# Patient Record
Sex: Male | Born: 1965
Health system: Southern US, Community
[De-identification: ages and names within clinical notes are randomized; demographics above are authoritative.]

## PROBLEM LIST (undated history)

## (undated) DIAGNOSIS — Z8489 Family history of other specified conditions: Secondary | ICD-10-CM

## (undated) DIAGNOSIS — F419 Anxiety disorder, unspecified: Secondary | ICD-10-CM

## (undated) DIAGNOSIS — G629 Polyneuropathy, unspecified: Secondary | ICD-10-CM

## (undated) DIAGNOSIS — Z79899 Other long term (current) drug therapy: Secondary | ICD-10-CM

## (undated) DIAGNOSIS — R002 Palpitations: Secondary | ICD-10-CM

## (undated) DIAGNOSIS — J449 Chronic obstructive pulmonary disease, unspecified: Secondary | ICD-10-CM

## (undated) DIAGNOSIS — D649 Anemia, unspecified: Secondary | ICD-10-CM

## (undated) DIAGNOSIS — Z87442 Personal history of urinary calculi: Secondary | ICD-10-CM

## (undated) DIAGNOSIS — M797 Fibromyalgia: Secondary | ICD-10-CM

## (undated) DIAGNOSIS — R911 Solitary pulmonary nodule: Secondary | ICD-10-CM

## (undated) DIAGNOSIS — E781 Pure hyperglyceridemia: Secondary | ICD-10-CM

## (undated) DIAGNOSIS — R7303 Prediabetes: Secondary | ICD-10-CM

## (undated) DIAGNOSIS — I2510 Atherosclerotic heart disease of native coronary artery without angina pectoris: Secondary | ICD-10-CM

## (undated) DIAGNOSIS — F1190 Opioid use, unspecified, uncomplicated: Secondary | ICD-10-CM

## (undated) DIAGNOSIS — K219 Gastro-esophageal reflux disease without esophagitis: Secondary | ICD-10-CM

## (undated) DIAGNOSIS — I10 Essential (primary) hypertension: Secondary | ICD-10-CM

## (undated) DIAGNOSIS — M1990 Unspecified osteoarthritis, unspecified site: Secondary | ICD-10-CM

## (undated) DIAGNOSIS — G8929 Other chronic pain: Secondary | ICD-10-CM

## (undated) DIAGNOSIS — R079 Chest pain, unspecified: Secondary | ICD-10-CM

## (undated) DIAGNOSIS — E538 Deficiency of other specified B group vitamins: Secondary | ICD-10-CM

## (undated) DIAGNOSIS — R109 Unspecified abdominal pain: Secondary | ICD-10-CM

## (undated) DIAGNOSIS — I639 Cerebral infarction, unspecified: Secondary | ICD-10-CM

## (undated) DIAGNOSIS — R21 Rash and other nonspecific skin eruption: Secondary | ICD-10-CM

## (undated) DIAGNOSIS — T8859XA Other complications of anesthesia, initial encounter: Secondary | ICD-10-CM

## (undated) HISTORY — DX: Atherosclerotic heart disease of native coronary artery without angina pectoris: I25.10

## (undated) HISTORY — PX: COLONOSCOPY: SHX174

## (undated) HISTORY — DX: Pure hyperglyceridemia: E78.1

## (undated) HISTORY — PX: TONSILLECTOMY: SUR1361

## (undated) HISTORY — DX: Solitary pulmonary nodule: R91.1

## (undated) HISTORY — DX: Gastro-esophageal reflux disease without esophagitis: K21.9

## (undated) HISTORY — DX: Essential (primary) hypertension: I10

---

## 1991-07-09 HISTORY — PX: ESOPHAGEAL DILATION: SHX303

## 1999-11-11 ENCOUNTER — Ambulatory Visit (HOSPITAL_COMMUNITY): Admission: RE | Admit: 1999-11-11 | Discharge: 1999-11-11 | Payer: Self-pay | Admitting: Neurological Surgery

## 1999-11-11 ENCOUNTER — Encounter: Payer: Self-pay | Admitting: Neurological Surgery

## 2001-01-26 ENCOUNTER — Encounter: Admission: RE | Admit: 2001-01-26 | Discharge: 2001-01-26 | Payer: Self-pay | Admitting: Neurosurgery

## 2002-03-03 ENCOUNTER — Encounter: Payer: Self-pay | Admitting: Rheumatology

## 2002-03-03 ENCOUNTER — Encounter: Admission: RE | Admit: 2002-03-03 | Discharge: 2002-03-03 | Payer: Self-pay | Admitting: Rheumatology

## 2003-06-21 ENCOUNTER — Ambulatory Visit (HOSPITAL_COMMUNITY): Admission: RE | Admit: 2003-06-21 | Discharge: 2003-06-21 | Payer: Self-pay | Admitting: Orthopaedic Surgery

## 2003-06-24 ENCOUNTER — Inpatient Hospital Stay (HOSPITAL_COMMUNITY): Admission: EM | Admit: 2003-06-24 | Discharge: 2003-06-29 | Payer: Self-pay

## 2003-06-28 ENCOUNTER — Encounter (INDEPENDENT_AMBULATORY_CARE_PROVIDER_SITE_OTHER): Payer: Self-pay | Admitting: *Deleted

## 2003-09-09 ENCOUNTER — Emergency Department (HOSPITAL_COMMUNITY): Admission: EM | Admit: 2003-09-09 | Discharge: 2003-09-10 | Payer: Self-pay | Admitting: Emergency Medicine

## 2003-10-11 ENCOUNTER — Encounter: Admission: RE | Admit: 2003-10-11 | Discharge: 2003-10-11 | Payer: Self-pay | Admitting: Sports Medicine

## 2003-11-01 ENCOUNTER — Encounter: Admission: RE | Admit: 2003-11-01 | Discharge: 2003-11-01 | Payer: Self-pay | Admitting: Sports Medicine

## 2004-09-19 ENCOUNTER — Emergency Department (HOSPITAL_COMMUNITY): Admission: EM | Admit: 2004-09-19 | Discharge: 2004-09-20 | Payer: Self-pay | Admitting: Emergency Medicine

## 2006-09-04 DIAGNOSIS — M797 Fibromyalgia: Secondary | ICD-10-CM | POA: Insufficient documentation

## 2006-09-04 DIAGNOSIS — G43909 Migraine, unspecified, not intractable, without status migrainosus: Secondary | ICD-10-CM | POA: Insufficient documentation

## 2006-09-04 DIAGNOSIS — F17200 Nicotine dependence, unspecified, uncomplicated: Secondary | ICD-10-CM | POA: Insufficient documentation

## 2006-10-28 ENCOUNTER — Ambulatory Visit: Payer: Self-pay | Admitting: Psychiatry

## 2006-10-28 ENCOUNTER — Inpatient Hospital Stay (HOSPITAL_COMMUNITY): Admission: AD | Admit: 2006-10-28 | Discharge: 2006-10-30 | Payer: Self-pay | Admitting: Psychiatry

## 2006-11-26 ENCOUNTER — Ambulatory Visit: Payer: Self-pay | Admitting: Family Medicine

## 2006-12-17 ENCOUNTER — Ambulatory Visit: Payer: Self-pay | Admitting: Family Medicine

## 2007-03-04 ENCOUNTER — Emergency Department (HOSPITAL_COMMUNITY): Admission: EM | Admit: 2007-03-04 | Discharge: 2007-03-04 | Payer: Self-pay | Admitting: Emergency Medicine

## 2007-11-30 ENCOUNTER — Emergency Department (HOSPITAL_COMMUNITY): Admission: EM | Admit: 2007-11-30 | Discharge: 2007-11-30 | Payer: Self-pay | Admitting: Emergency Medicine

## 2010-02-07 ENCOUNTER — Emergency Department (HOSPITAL_COMMUNITY): Admission: EM | Admit: 2010-02-07 | Discharge: 2010-02-08 | Payer: Self-pay | Admitting: Emergency Medicine

## 2010-09-21 LAB — DIFFERENTIAL
Basophils Absolute: 0 10*3/uL (ref 0.0–0.1)
Basophils Relative: 0 % (ref 0–1)
Eosinophils Absolute: 0 10*3/uL (ref 0.0–0.7)
Eosinophils Relative: 0 % (ref 0–5)
Lymphocytes Relative: 28 % (ref 12–46)
Lymphs Abs: 2.8 10*3/uL (ref 0.7–4.0)
Monocytes Absolute: 0.7 10*3/uL (ref 0.1–1.0)
Monocytes Relative: 7 % (ref 3–12)
Neutro Abs: 6.5 10*3/uL (ref 1.7–7.7)
Neutrophils Relative %: 65 % (ref 43–77)

## 2010-09-21 LAB — RAPID URINE DRUG SCREEN, HOSP PERFORMED
Amphetamines: NOT DETECTED
Barbiturates: NOT DETECTED
Benzodiazepines: POSITIVE — AB
Cocaine: NOT DETECTED
Opiates: POSITIVE — AB
Tetrahydrocannabinol: NOT DETECTED

## 2010-09-21 LAB — BASIC METABOLIC PANEL
BUN: 11 mg/dL (ref 6–23)
CO2: 26 mEq/L (ref 19–32)
Calcium: 9.5 mg/dL (ref 8.4–10.5)
Chloride: 107 mEq/L (ref 96–112)
Creatinine, Ser: 1.09 mg/dL (ref 0.4–1.5)
GFR calc Af Amer: >60 mL/min (ref >60)
GFR calc non Af Amer: >60 mL/min (ref >60)
Glucose, Bld: 107 mg/dL — ABNORMAL HIGH (ref 70–99)
Potassium: 3.5 mEq/L (ref 3.5–5.1)
Sodium: 140 mEq/L (ref 135–145)

## 2010-09-21 LAB — CBC
HCT: 41.5 % (ref 39.0–52.0)
Hemoglobin: 14.2 g/dL (ref 13.0–17.0)
MCH: 32.6 pg (ref 26.0–34.0)
MCHC: 34.1 g/dL (ref 30.0–36.0)
MCV: 95.8 fL (ref 78.0–100.0)
Platelets: 286 10*3/uL (ref 150–400)
RBC: 4.34 MIL/uL (ref 4.22–5.81)
RDW: 13.4 % (ref 11.5–15.5)
WBC: 10.1 10*3/uL (ref 4.0–10.5)

## 2010-09-21 LAB — ETHANOL: Alcohol, Ethyl (B): <5 mg/dL (ref 0–10)

## 2010-11-23 NOTE — Consult Note (Signed)
NAMEMarland Brady  OMA, ALPERT                             ACCOUNT NO.:  [id number]   MEDICAL RECORD NO.:  [id number]                   PATIENT TYPE:  INP   LOCATION:  5525                                 FACILITY:  [facility]   PHYSICIAN:  Graylin Shiver, M.D.                DATE OF BIRTH:  09-09-1965   DATE OF CONSULTATION:  06/25/2003  DATE OF DISCHARGE:                                   CONSULTATION   REFERRING PHYSICIAN:  Dr. Theressa Millard.   REASON FOR CONSULTATION:  This patient is a 45 year old Caucasian male  admitted to the hospital with lower abdominal pain, diarrhea and rectal  bleeding.  The patient states that he has had problems off and on for five  years with lower abdominal pain and loose stools.  He has also noticed  intermittent blood in his stools when he has a lot of diarrhea; he thinks  this was probably due to irritation of his hemorrhoids, however.  Over the  past year, he has noticed that his abdominal pain and loose stools have been  worse.  He was told in the past that he had irritable bowel syndrome.  He  reports having had an EGD and colonoscopy about five years ago in [place] and  states that nothing specifically was found at that time.  His mother reports  that when he was 9 years old, he had a colonoscopy because of some bleeding  and they found a polyp in his colon.  A CT scan of the abdomen was done, and  also of the pelvis, which shows inflammation of the sigmoid and descending  colon.   PAST MEDICAL HISTORY:   MEDICAL PROBLEMS:  1. Fibromyalgia.  2. Migraine headaches.  3. History of colon polyp.  4. History of irritable bowel syndrome.   ALLERGIES:  None known.   MEDICATIONS PRIOR TO ADMISSION:  Neurontin, Klonopin.   SYSTEMS REVIEW:  Denies fever or chills.  Denies chest pain, short of  breath, cough, sputum production.   PHYSICAL EXAMINATION:  GENERAL:  He appears in slight distress, complaining  of pain in the lower abdomen.  HEENT:  He is  nonicteric.  NECK:  Neck is supple.  HEART:  Regular rhythm.  No murmurs.  LUNGS:  Lungs are clear.  ABDOMEN:  Bowel sounds are present and soft.  There is tenderness in the  left lower quadrant and over the suprapubic area.  There is minimal  guarding, no rebound.   LABORATORY DATA:  White blood cell count 11,900, hemoglobin and hematocrit  15.1 and 43.5 on admission.   IMPRESSION:  Colitis.   COMMENTS:  I suspect that this is idiopathic ulcerative colitis, in view of  the chronic history that I am getting of diarrhea and lower abdominal pain.  He also answered that he had had some intermittent bleeding over the years.  We certainly need to check stool  for O&P, culture and C. difficile toxin and  these are pending.  In view of his current symptoms, I will go ahead and  treat him as though this is an acute flare-up of colitis.  I will start him  on Asacol two pills t.i.d., also start him on Solu-Medrol 40 mg IV b.i.d.; I  will also start him on Cipro 400 mg b.i.d. and Flagyl 250 mg IV q.6 h., just  in case he might have some underlying infectious pathogen causing this acute  flare-up.  I will plan to proceed with colonoscopy in a few days, when his  symptoms calm down.                                               Graylin Shiver, M.D.    Germain Osgood  D:  06/25/2003  T:  06/27/2003  Job:  [id number]   cc:   Theressa Millard, M.D.  [address] [place]  [place] [address]  Fax: [phone]

## 2010-11-23 NOTE — Discharge Summary (Signed)
NAME:  Mathew Brady, Mathew Brady NO.:  [id number]   MEDICAL RECORD NO.:  [id number]                   PATIENT TYPE:  INP   LOCATION:  5525                                 FACILITY:  [facility]   PHYSICIAN:  Graylin Shiver, M.D.                DATE OF BIRTH:  [birth year]   DATE OF ADMISSION:  06/24/2003  DATE OF DISCHARGE:  06/29/2003                                 DISCHARGE SUMMARY   REASON FOR ADMISSION:  The patient is a 45 year old male admitted to the  hospital by Dr. __________ with complaints of lower abdominal pain,  diarrhea, and rectal bleeding. I saw the patient in consultation on June 25, 2003. He stated that he had a problem off and on for years with lower  abdominal pain and loose stools. He also noted some intermittent blood in  his stools. He had a history of having had an EGD and colonoscopy about five  years ago and nothing was found. A CT scan done on this admission of the  abdomen and pelvis showed inflammation in the sigmoid and descending colon.  Has a history of fibromyalgia and migraine headache. History of colon  polyps, history of irritable bowel syndrome.   ALLERGIES:  No known drug allergies.   PHYSICAL EXAMINATION:  GENERAL:  He did not appear in any major distress,  although he was complaining of some lower abdominal pain.  NECK:  Supple.  HEART:  Regular rhythm. Normal.  LUNGS:  Clear.  ABDOMEN:  Soft, nontender.   HOSPITAL COURSE:  It was felt that he had colitis based on his presentation  and CT scan. Stool studies were checked. Stool was negative for Clostridium  difficile toxin. Stool culture did not reveal Salmonella, Shigella,  Campylobacter or yersinia. The patient was complaining of abdominal pain. He  was treated with narcotic analgesics for this pain. A surgical consult was  obtained. It was not felt that he had a surgical abdomen. A colonoscopy was  done, which showed mild hyperemia and erythema between 30 and 40 cm in  the  descending colon. Biopsies of this are showed essentially unremarkable  mucosa with no active colitis. The patient was again started on Asacol and  IV steroids. These were subsequently discontinued. He improved clinically  and was discharged on June 29, 2003.   FINAL DIAGNOSIS:  Acute self-limiting colitis of unknown etiology.   DISPOSITION:  He was discharged home.   DIET:  As tolerated   DISCHARGE INSTRUCTIONS:  Instructed to follow up with his primary care  physician. He was to resume his home medications. He was also given a  prescription for Bentyl 20 mg p.o. q.i.d. for abdominal cramps and Cipro 500  mg p.o. b.i.d. for five days.  Graylin Shiver, M.D.   Germain Osgood  D:  08/02/2003  T:  08/03/2003  Job:  [id number]

## 2010-11-23 NOTE — Op Note (Signed)
NAMEMarland Brady  JOSEP, LUVIANO                             ACCOUNT NO.:  [id number]   MEDICAL RECORD NO.:  [id number]                   PATIENT TYPE:  INP   LOCATION:  5525                                 FACILITY:  [facility]   PHYSICIAN:  Graylin Shiver, M.D.                DATE OF BIRTH:  07/28/1965   DATE OF PROCEDURE:  06/28/2003  DATE OF DISCHARGE:  06/29/2003                                 OPERATIVE REPORT   PROCEDURE PERFORMED:  Colonoscopy with biopsy.   INDICATIONS FOR PROCEDURE:  The patient is a 45 year old male who was  admitted to the hospital with abdominal and diarrhea.  He was found to have  a CT scan showing an abnormal wall thickening in the descending colon and  sigmoid suggesting colitis.  Stool culture and Clostridium difficile toxin  were negative.  The patient is undergoing colonoscopy to further evaluate.   Informed consent was obtained after explanation of the risks of bleeding,  infection, and perforation.   PREMEDICATIONS:  Fentanyl 125 mcg  IV, Versed 12 mg IV.   DESCRIPTION OF PROCEDURE:  With the patient in the left lateral decubitus  position, a rectal exam was performed and no masses were felt.  The Olympus  colonoscope was inserted into the rectum and advanced around the colon to  the cecum.  Cecal landmarks were identified.  The cecum and ascending colon  were normal.  The transverse colon normal.  In the descending colon, between  30 and 40 cm, there was some mild patchy hyperemia and erythema.  Biopsies  were obtained.  The sigmoid and rectum looked normal.  The patient tolerated  the procedure well without complications.   IMPRESSION:  Mild hyperemia and erythema which was patchy between 30 and 40  cm. The findings on this examination are fairly minimal.  Biopsies were  obtained for histological inspection.   PLAN:  I will discontinue the patient's IV steroid and IV antibiotics.  I  will keep him on Cipro 500 mg by mouth now twice daily.  We will advance  the  diet and see how he does clinically.                                               Graylin Shiver, M.D.    Germain Osgood  D:  06/28/2003  T:  06/29/2003  Job:  [id number]

## 2010-11-23 NOTE — Discharge Summary (Signed)
NAMENEEKO, PHARO NO.:  [id number]   MEDICAL RECORD NO.:  [id number]          PATIENT TYPE:  IPS   LOCATION:  0504                          FACILITY:  [facility]   PHYSICIAN:  Geoffery Lyons, M.D.      DATE OF BIRTH:  1966-07-05   DATE OF ADMISSION:  10/28/2006  DATE OF DISCHARGE:  10/30/2006                               DISCHARGE SUMMARY   IDENTIFYING INFORMATION:  This is a 45 year old white male who is  married.  This is a voluntary admission.   CHIEF COMPLAINT:  I'm addicted to Vicodin and I ran out.   HISTORY OF PRESENT ILLNESS:  The patient requests detox from opiates.  He has been using opiates in various fashions since 1995.  Said that it  started additionally with some dental care and then escalated from  there.  Now is taking opiates off the street.  He feels that he realized  over the course of the past couple months that the opiates were  beginning to take priority over management of his bills and care of his  family.  He is up to using about 15-20 tablets daily and is swallowing  these.  No history of IV drug use.  Feels that at this point he is  unable to keep up with his addiction, getting muscle spasms, shakes, no  longer getting the highs that he did before, also admits to having  formication and diaphoresis, became much worse over the weekend.  He has  also begun using benzodiazepines occasionally with his last use about  two weeks ago.  Took a couple tablets of Valium.  He has used cocaine in  the past but his last use was 17 years ago.  Has used marijuana.  This  is his first admission to [facility].  He does  have a history of detox from alcohol in 1992 at [facility].  Not receiving any current psychiatric care and no counseling.  His wife  has also been putting a lot of pressure on him to become clean and  sober.   SOCIAL HISTORY:  Married white male with children at home.  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  No current  legal charges.   FAMILY HISTORY:  Family history is remarkable for a grandfather with a  history of alcohol addiction.   MEDICAL HISTORY:  The patient is followed by Dr. Lysbeth Galas, MD at [facility] in [place], [place] and has seen Dr.  Kellie Simmering in the past here in [place] for a history of sleeping  disorder for which he took Klonopin at one time.  No history of  seizures.  He denies any current medical problems.   MEDICATIONS:  None.   ALLERGIES:  None.   POSITIVE PHYSICAL FINDINGS:  Well-nourished, well-developed male who is  in no acute distress.  Full physical exam was done in the [facility]  and is noted in the record, is generally unremarkable.  The patient's  __________ scale was  a little less than 5 at the time of admission.  On  presentation to our unit, well-nourished, well-developed male, height 5  feet 7 inches tall, weight 165 pounds, temperature 97.7, pulse 84,  respirations 18, blood pressure of 130/76.   LABORATORY DATA:  Chemistries all within normal limits.  Liver enzymes  are all within normal limits.  TSH 0.708.  Acetaminophen level was less  than 10.  Urine drug screen positive for opiates and marijuana.   MENTAL STATUS EXAM:  On discharge, fully alert male, coherent, pleasant,  polite with appropriate affect.  He is in full contact with reality.  Speech is normal in pace, tone production, is relevant.  He is  articulate.  Mood is mildly anxious.  He also expresses quite a bit of  shame and guilt over his drug use.  He says that he recognizes that he  is the only one that can stop him from using drugs.  He assumes full  responsibility for his addiction.  He was detoxed in 1992 from alcohol  and, through sheer determination, he went home, insisted on staying off  substances and that is what he plans on doing with the opiates.  He  feels that he needs to shoulder the  responsibility, not planning on  attending meetings at this point, does not feel that that is the best  thing for him.  Definitely no suicidal or homicidal thoughts.  No flight  of ideas paranoia or any signs of psychosis.  Cognition is well-  preserved.  Insight adequate.  Impulse control and judgment within  normal limits.  Very determined.  Calculation and concentration are  intact.   DISCHARGE DIAGNOSES:  AXIS I:  Opiate abuse and dependence.  Polysubstance abuse.  AXIS II:  Deferred.  AXIS III:  Fibromyalgia.  AXIS IV:  Severe (financial issues secondary to his addiction).  AXIS V:  Current 35; past year 66.   ASSETS:  The patient has a stable marriage, supportive family and solid  business and goals for himself.   PLAN:  Discharge the patient today.  He has tolerated his clonidine  protocol well.  He would like to finish his protocol at home.  He has  business waiting for him and does not want to disrupt his work  situation.  We plan on discharging him today.   DISCHARGE MEDICATIONS:  1. Librium 25 mg, 1 at 5 p.m. today, 1 in the morning on October 31, 2006 and that will be finished at that point.  2. Catapres 0.1 mg, 1 at bedtime October 30, 2006 and 2 on October 31, 2006 and then he has finished detox.  3. Trazodone 100 mg, 1-2 tablets at bedtime for the next five days.   FOLLOWUP:  The patient is in agreement with the plan.  We have been in  contact with his wife who is also in agreement and he will follow up  with [facility] in [place] on Tuesday, Nov 11, 2006  at 8 a.m.      Margaret A. Scott, N.P.      Geoffery Lyons, M.D.  Electronically Signed    MAS/MEDQ  D:  10/30/2006  T:  10/30/2006  Job:  [id number]

## 2012-01-26 ENCOUNTER — Other Ambulatory Visit: Payer: Self-pay

## 2012-01-26 ENCOUNTER — Observation Stay (HOSPITAL_COMMUNITY)
Admission: EM | Admit: 2012-01-26 | Discharge: 2012-01-27 | DRG: 897 | Disposition: A | Discharge status: Home / Self Care | Payer: Self-pay | Attending: Internal Medicine | Admitting: Internal Medicine

## 2012-01-26 ENCOUNTER — Encounter (HOSPITAL_COMMUNITY): Payer: Self-pay

## 2012-01-26 ENCOUNTER — Emergency Department (HOSPITAL_COMMUNITY): Payer: Self-pay

## 2012-01-26 DIAGNOSIS — R531 Weakness: Secondary | ICD-10-CM | POA: Diagnosis present

## 2012-01-26 DIAGNOSIS — R209 Unspecified disturbances of skin sensation: Secondary | ICD-10-CM | POA: Insufficient documentation

## 2012-01-26 DIAGNOSIS — R4789 Other speech disturbances: Secondary | ICD-10-CM | POA: Diagnosis present

## 2012-01-26 DIAGNOSIS — E876 Hypokalemia: Secondary | ICD-10-CM | POA: Diagnosis present

## 2012-01-26 DIAGNOSIS — IMO0001 Reserved for inherently not codable concepts without codable children: Secondary | ICD-10-CM | POA: Diagnosis present

## 2012-01-26 DIAGNOSIS — M797 Fibromyalgia: Secondary | ICD-10-CM | POA: Diagnosis present

## 2012-01-26 DIAGNOSIS — F1320 Sedative, hypnotic or anxiolytic dependence, uncomplicated: Secondary | ICD-10-CM | POA: Diagnosis present

## 2012-01-26 DIAGNOSIS — G43909 Migraine, unspecified, not intractable, without status migrainosus: Secondary | ICD-10-CM

## 2012-01-26 DIAGNOSIS — I10 Essential (primary) hypertension: Secondary | ICD-10-CM | POA: Diagnosis present

## 2012-01-26 DIAGNOSIS — F411 Generalized anxiety disorder: Secondary | ICD-10-CM | POA: Diagnosis present

## 2012-01-26 DIAGNOSIS — F17200 Nicotine dependence, unspecified, uncomplicated: Secondary | ICD-10-CM | POA: Diagnosis present

## 2012-01-26 DIAGNOSIS — F1120 Opioid dependence, uncomplicated: Principal | ICD-10-CM | POA: Diagnosis present

## 2012-01-26 DIAGNOSIS — M129 Arthropathy, unspecified: Secondary | ICD-10-CM | POA: Diagnosis present

## 2012-01-26 DIAGNOSIS — I6350 Cerebral infarction due to unspecified occlusion or stenosis of unspecified cerebral artery: Secondary | ICD-10-CM

## 2012-01-26 DIAGNOSIS — R51 Headache: Secondary | ICD-10-CM | POA: Diagnosis present

## 2012-01-26 DIAGNOSIS — F419 Anxiety disorder, unspecified: Secondary | ICD-10-CM | POA: Diagnosis present

## 2012-01-26 DIAGNOSIS — I639 Cerebral infarction, unspecified: Secondary | ICD-10-CM

## 2012-01-26 DIAGNOSIS — K120 Recurrent oral aphthae: Secondary | ICD-10-CM | POA: Diagnosis present

## 2012-01-26 DIAGNOSIS — K029 Dental caries, unspecified: Secondary | ICD-10-CM | POA: Diagnosis present

## 2012-01-26 HISTORY — DX: Unspecified osteoarthritis, unspecified site: M19.90

## 2012-01-26 HISTORY — DX: Fibromyalgia: M79.7

## 2012-01-26 HISTORY — DX: Essential (primary) hypertension: I10

## 2012-01-26 LAB — DIFFERENTIAL
Basophils Absolute: 0 10*3/uL (ref 0.0–0.1)
Basophils Relative: 0 % (ref 0–1)
Eosinophils Absolute: 0.1 10*3/uL (ref 0.0–0.7)
Eosinophils Relative: 1 % (ref 0–5)
Lymphocytes Relative: 40 % (ref 12–46)
Lymphs Abs: 2.7 10*3/uL (ref 0.7–4.0)
Monocytes Absolute: 0.5 10*3/uL (ref 0.1–1.0)
Monocytes Relative: 7 % (ref 3–12)
Neutro Abs: 3.5 10*3/uL (ref 1.7–7.7)
Neutrophils Relative %: 52 % (ref 43–77)

## 2012-01-26 LAB — COMPREHENSIVE METABOLIC PANEL
ALT: 12 U/L (ref 0–53)
AST: 13 U/L (ref 0–37)
Albumin: 3.8 g/dL (ref 3.5–5.2)
Alkaline Phosphatase: 79 U/L (ref 39–117)
BUN: 11 mg/dL (ref 6–23)
CO2: 29 mEq/L (ref 19–32)
Calcium: 9.8 mg/dL (ref 8.4–10.5)
Chloride: 98 mEq/L (ref 96–112)
Creatinine, Ser: 0.88 mg/dL (ref 0.50–1.35)
GFR calc Af Amer: >90 mL/min (ref >90)
GFR calc non Af Amer: >90 mL/min (ref >90)
Glucose, Bld: 95 mg/dL (ref 70–99)
Potassium: 3.3 mEq/L — ABNORMAL LOW (ref 3.5–5.1)
Sodium: 133 mEq/L — ABNORMAL LOW (ref 135–145)
Total Bilirubin: 0.3 mg/dL (ref 0.3–1.2)
Total Protein: 6.9 g/dL (ref 6.0–8.3)

## 2012-01-26 LAB — RAPID URINE DRUG SCREEN, HOSP PERFORMED
Amphetamines: NOT DETECTED
Barbiturates: NOT DETECTED
Benzodiazepines: POSITIVE — AB
Cocaine: NOT DETECTED
Opiates: POSITIVE — AB
Tetrahydrocannabinol: NOT DETECTED

## 2012-01-26 LAB — CBC
HCT: 40.2 % (ref 39.0–52.0)
Hemoglobin: 14 g/dL (ref 13.0–17.0)
MCH: 33.1 pg (ref 26.0–34.0)
MCHC: 34.8 g/dL (ref 30.0–36.0)
MCV: 95 fL (ref 78.0–100.0)
Platelets: 252 10*3/uL (ref 150–400)
RBC: 4.23 MIL/uL (ref 4.22–5.81)
RDW: 12.5 % (ref 11.5–15.5)
WBC: 6.8 10*3/uL (ref 4.0–10.5)

## 2012-01-26 LAB — GLUCOSE, CAPILLARY: Glucose-Capillary: 91 mg/dL (ref 70–99)

## 2012-01-26 LAB — TROPONIN I: Troponin I: <0.3 ng/mL (ref <0.30)

## 2012-01-26 LAB — CK TOTAL AND CKMB (NOT AT ARMC)
CK, MB: 2.6 ng/mL (ref 0.3–4.0)
Relative Index: 2.4 (ref 0.0–2.5)
Total CK: 109 U/L (ref 7–232)

## 2012-01-26 LAB — PROTIME-INR
INR: 1.01 (ref 0.00–1.49)
Prothrombin Time: 13.5 seconds (ref 11.6–15.2)

## 2012-01-26 LAB — APTT: aPTT: 37 seconds (ref 24–37)

## 2012-01-26 MED ORDER — MORPHINE SULFATE 2 MG/ML IJ SOLN
INTRAMUSCULAR | Status: AC
  Administered 2012-01-26: 2 mg via INTRAVENOUS
  Filled 2012-01-26: qty 1

## 2012-01-26 MED ORDER — MORPHINE SULFATE 2 MG/ML IJ SOLN
INTRAMUSCULAR | Status: AC
  Filled 2012-01-26: qty 1

## 2012-01-26 NOTE — ED Provider Notes (Cosign Needed)
History  This chart was scribed for Mathew Quarry, MD by Erskine Emery. This patient was seen in room APAH2/APAH2 and the patient's care was started at 15:23.   CSN: [id number]  Arrival date & time 01/26/12  1458   First MD Initiated Contact with Patient 01/26/12 1523      Chief Complaint  Patient presents with  . Aphasia  . Headache  . Dysphagia    (Consider location/radiation/quality/duration/timing/severity/associated sxs/prior treatment) HPI  Mathew Brady is a 46 y.o. male who presents to the Emergency Department complaining of gradually worsening aphasia and left-sided weakness as of yesterday with associated intermittent generalized weakness, disorientation, dizziness, and diaphoresis for the last 8 days. Pt reports he has been ambulatory. Pt's wife reports he does not go back to normal in between episodes but gets worse over night. Pt's wife reports a h/o fibromyalgia, back pain, and HTN for which he takes high BP meds, xanax, and vicodin. Pt's wife report she is allergic to neurontin. Pt's wife reports he does smoke but does not drink or do any other drugs.   Dr. Mickle Plumb in [place] is pt's PCP.   Past Medical History  Diagnosis Date  . Hypertension   . Arthritis   . Fibromyalgia     History reviewed. No pertinent past surgical history.  No family history on file.  History  Substance Use Topics  . Smoking status: Current Everyday Smoker  . Smokeless tobacco: Not on file  . Alcohol Use: No      Review of Systems  Constitutional: Positive for diaphoresis. Negative for fever and chills.  Respiratory: Negative for shortness of breath.   Gastrointestinal: Negative for nausea, vomiting and diarrhea.  Neurological: Positive for dizziness, speech difficulty and headaches. Negative for weakness.  Psychiatric/Behavioral: Positive for confusion.    Allergies  Gabapentin  Home Medications  No current outpatient prescriptions on file.  Triage Vitals: BP 135/82   Pulse 71  Temp 98.5 F (36.9 C) (Oral)  Resp 20  Ht 5\' 6"  (1.676 m)  Wt 200 lb (90.719 kg)  BMI 32.28 kg/m2  SpO2 98%  Physical Exam  Nursing note and vitals reviewed. Constitutional: He is oriented to person, place, and time. He appears well-developed and well-nourished. No distress.  HENT:  Head: Normocephalic and atraumatic.  Eyes: EOM are normal.  Neck: Neck supple. No tracheal deviation present.  Cardiovascular: Normal rate.   Pulmonary/Chest: Effort normal. No respiratory distress.  Abdominal: Soft. He exhibits no distension.  Musculoskeletal: He exhibits no edema.  Neurological: He is alert and oriented to person, place, and time. No sensory deficit. GCS eye subscore is 4. GCS verbal subscore is 5. GCS motor subscore is 6.       In the left arm there is palmar drift. Strength is 4/5 at left knee extensors. Left toe planar flexors are 4/5, dorsi flexion is 2/5. Dysarthric in speech. Lateralizing, no facial droop appreciated.   Skin: Skin is warm and dry.  Psychiatric: He has a normal mood and affect. His speech is slurred.    ED Course  Procedures (including critical care time)  DIAGNOSTIC STUDIES: Oxygen Saturation is 98% on room air, normal by my interpretation.     Date: 03/16/2012  Rate: 67  Rhythm: normal sinus rhythm  QRS Axis: normal  Intervals: normal  ST/T Wave abnormalities: normal  Conduction Disutrbances: none  Narrative Interpretation: unremarkable      COORDINATION OF CARE: 15:37--I evaluated the patient and we discussed a treatment plan including  a CT scan to which the pt and his wife agreed.    Labs Reviewed - No data to display No results found.   No diagnosis found.  Results for orders placed during the hospital encounter of 01/26/12  [facility]      Component Value Range   Prothrombin Time 13.5  11.6 - 15.2 seconds   INR 1.01  0.00 - 1.49  APTT      Component Value Range   aPTT 37  24 - 37 seconds  CBC      Component Value  Range   WBC 6.8  4.0 - 10.5 K/uL   RBC 4.23  4.22 - 5.81 MIL/uL   Hemoglobin 14.0  13.0 - 17.0 g/dL   HCT 78.2  95.6 - 21.3 %   MCV 95.0  78.0 - 100.0 fL   MCH 33.1  26.0 - 34.0 pg   MCHC 34.8  30.0 - 36.0 g/dL   RDW 08.6  57.8 - 46.9 %   Platelets 252  150 - 400 K/uL  DIFFERENTIAL      Component Value Range   Neutrophils Relative 52  43 - 77 %   Neutro Abs 3.5  1.7 - 7.7 K/uL   Lymphocytes Relative 40  12 - 46 %   Lymphs Abs 2.7  0.7 - 4.0 K/uL   Monocytes Relative 7  3 - 12 %   Monocytes Absolute 0.5  0.1 - 1.0 K/uL   Eosinophils Relative 1  0 - 5 %   Eosinophils Absolute 0.1  0.0 - 0.7 K/uL   Basophils Relative 0  0 - 1 %   Basophils Absolute 0.0  0.0 - 0.1 K/uL  COMPREHENSIVE METABOLIC PANEL      Component Value Range   Sodium 133 (*) 135 - 145 mEq/L   Potassium 3.3 (*) 3.5 - 5.1 mEq/L   Chloride 98  96 - 112 mEq/L   CO2 29  19 - 32 mEq/L   Glucose, Bld 95  70 - 99 mg/dL   BUN 11  6 - 23 mg/dL   Creatinine, Ser 6.29  0.50 - 1.35 mg/dL   Calcium 9.8  8.4 - 52.8 mg/dL   Total Protein 6.9  6.0 - 8.3 g/dL   Albumin 3.8  3.5 - 5.2 g/dL   AST 13  0 - 37 U/L   ALT 12  0 - 53 U/L   Alkaline Phosphatase 79  39 - 117 U/L   Total Bilirubin 0.3  0.3 - 1.2 mg/dL   GFR calc non Af Amer >90  >90 mL/min   GFR calc Af Amer >90  >90 mL/min  CK TOTAL AND CKMB      Component Value Range   Total CK 109  7 - 232 U/L   CK, MB 2.6  0.3 - 4.0 ng/mL   Relative Index 2.4  0.0 - 2.5  TROPONIN I      Component Value Range   Troponin I <0.30  <0.30 ng/mL  URINE RAPID DRUG SCREEN (HOSP PERFORMED)      Component Value Range   Opiates POSITIVE (*) NONE DETECTED   Cocaine NONE DETECTED  NONE DETECTED   Benzodiazepines POSITIVE (*) NONE DETECTED   Amphetamines NONE DETECTED  NONE DETECTED   Tetrahydrocannabinol NONE DETECTED  NONE DETECTED   Barbiturates NONE DETECTED  NONE DETECTED  GLUCOSE, CAPILLARY      Component Value Range   Glucose-Capillary 91  70 - 99 mg/dL     MDM  CRITICAL  CARE Performed by: Mathew Brady   Total critical care time:30  Critical care time was exclusive of separately billable procedures and treating other patients.  Critical care was necessary to treat or prevent imminent or life-threatening deterioration.  Critical care was time spent personally by me on the following activities: development of treatment plan with patient and/or surrogate as well as nursing, discussions with consultants, evaluation of patient's response to treatment, examination of patient, obtaining history from patient or surrogate, ordering and performing treatments and interventions, ordering and review of laboratory studies, ordering and review of radiographic studies, pulse oximetry and re-evaluation of patient's condition.   Patient with some lue and lle weakness and dysarthria.  Symptoms began a week ago and constant since yesterday, so out of acute treatment window for lytic agents.   Plan admission to hospitalist for further assessment and treatment.     I personally performed the services described in this documentation, which was scribed in my presence. The recorded information has been reviewed and considered.    Mathew Quarry, MD 01/26/12 1719  Mathew Quarry, MD 01/29/12 1610  Mathew Quarry, MD 01/29/12 [id number]  Mathew Quarry, MD 01/30/12 5409    Mathew Quarry, MD 03/13/12 [id number]  Mathew Quarry, MD 03/16/12 1478

## 2012-01-26 NOTE — ED Notes (Signed)
Pt reports slurred speech, left sided facial numbness, and weakness for a week.  Pt reports that he has been having "weak spells" for a few weeks.  Pt reports weakness to his left side more than right.

## 2012-01-26 NOTE — ED Notes (Signed)
Pt reports difficulty swallowing and difficulty keeping his balance while walking.

## 2012-01-27 ENCOUNTER — Inpatient Hospital Stay (HOSPITAL_COMMUNITY): Payer: Self-pay

## 2012-01-27 DIAGNOSIS — E876 Hypokalemia: Secondary | ICD-10-CM | POA: Diagnosis present

## 2012-01-27 DIAGNOSIS — F1320 Sedative, hypnotic or anxiolytic dependence, uncomplicated: Secondary | ICD-10-CM

## 2012-01-27 DIAGNOSIS — F419 Anxiety disorder, unspecified: Secondary | ICD-10-CM | POA: Diagnosis present

## 2012-01-27 DIAGNOSIS — I10 Essential (primary) hypertension: Secondary | ICD-10-CM | POA: Diagnosis present

## 2012-01-27 DIAGNOSIS — F1120 Opioid dependence, uncomplicated: Secondary | ICD-10-CM

## 2012-01-27 DIAGNOSIS — I6789 Other cerebrovascular disease: Secondary | ICD-10-CM

## 2012-01-27 DIAGNOSIS — M6281 Muscle weakness (generalized): Secondary | ICD-10-CM

## 2012-01-27 DIAGNOSIS — K029 Dental caries, unspecified: Secondary | ICD-10-CM | POA: Diagnosis present

## 2012-01-27 DIAGNOSIS — I639 Cerebral infarction, unspecified: Secondary | ICD-10-CM | POA: Diagnosis present

## 2012-01-27 DIAGNOSIS — R531 Weakness: Secondary | ICD-10-CM | POA: Diagnosis present

## 2012-01-27 DIAGNOSIS — K120 Recurrent oral aphthae: Secondary | ICD-10-CM | POA: Diagnosis present

## 2012-01-27 LAB — LIPID PANEL
Cholesterol: 221 mg/dL — ABNORMAL HIGH (ref 0–200)
HDL: 30 mg/dL — ABNORMAL LOW (ref >39)
LDL Cholesterol: UNDETERMINED mg/dL (ref 0–99)
Total CHOL/HDL Ratio: 7.4 RATIO
Triglycerides: 429 mg/dL — ABNORMAL HIGH (ref <150)
VLDL: UNDETERMINED mg/dL (ref 0–40)

## 2012-01-27 MED ORDER — NICOTINE 21 MG/24HR TD PT24
21.0000 mg | MEDICATED_PATCH | Freq: Every day | TRANSDERMAL | Status: DC
  Administered 2012-01-27: 21 mg via TRANSDERMAL
  Filled 2012-01-27: qty 1

## 2012-01-27 MED ORDER — DEXTROSE-NACL 5-0.45 % IV SOLN
INTRAVENOUS | Status: DC
  Administered 2012-01-27: 09:00:00 via INTRAVENOUS

## 2012-01-27 MED ORDER — HYDROCODONE-ACETAMINOPHEN 5-325 MG PO TABS: 1.0000 | ORAL_TABLET | Freq: Three times a day (TID) | ORAL | Status: AC | PRN

## 2012-01-27 MED ORDER — MORPHINE SULFATE 4 MG/ML IJ SOLN
INTRAMUSCULAR | Status: AC
  Filled 2012-01-27: qty 1

## 2012-01-27 MED ORDER — ONDANSETRON HCL 4 MG PO TABS: 4.0000 mg | ORAL_TABLET | Freq: Four times a day (QID) | ORAL | Status: DC | PRN

## 2012-01-27 MED ORDER — LORAZEPAM 2 MG/ML IJ SOLN
1.0000 mg | Freq: Two times a day (BID) | INTRAMUSCULAR | Status: DC
  Administered 2012-01-27: 1 mg via INTRAVENOUS
  Filled 2012-01-27: qty 1

## 2012-01-27 MED ORDER — ONDANSETRON HCL 4 MG/2ML IJ SOLN
4.0000 mg | Freq: Four times a day (QID) | INTRAMUSCULAR | Status: DC | PRN
  Administered 2012-01-27: 4 mg via INTRAVENOUS
  Filled 2012-01-27: qty 2

## 2012-01-27 MED ORDER — ASPIRIN 300 MG RE SUPP
300.0000 mg | Freq: Every day | RECTAL | Status: DC
  Administered 2012-01-27: 300 mg via RECTAL
  Filled 2012-01-27 (×3): qty 1

## 2012-01-27 MED ORDER — ENOXAPARIN SODIUM 40 MG/0.4ML ~~LOC~~ SOLN
40.0000 mg | SUBCUTANEOUS | Status: DC
  Administered 2012-01-27: 40 mg via SUBCUTANEOUS
  Filled 2012-01-27: qty 0.4

## 2012-01-27 MED ORDER — MORPHINE SULFATE 2 MG/ML IJ SOLN
2.0000 mg | INTRAMUSCULAR | Status: DC | PRN
  Administered 2012-01-27 (×2): 4 mg via INTRAVENOUS
  Filled 2012-01-27 (×2): qty 2

## 2012-01-27 MED ORDER — ALPRAZOLAM 0.25 MG PO TABS: 0.2500 mg | ORAL_TABLET | Freq: Two times a day (BID) | ORAL | Status: DC | PRN

## 2012-01-27 MED ORDER — SODIUM CHLORIDE 0.9 % IJ SOLN
INTRAMUSCULAR | Status: AC
  Administered 2012-01-27: 10 mL
  Filled 2012-01-27: qty 3

## 2012-01-27 MED FILL — Enoxaparin Sodium Inj 40 MG/0.4ML: SUBCUTANEOUS | Qty: 0.4 | Status: AC

## 2012-01-27 MED FILL — Morphine Sulfate Inj 4 MG/ML: INTRAMUSCULAR | Qty: 1 | Status: AC

## 2012-01-27 MED FILL — Lorazepam Inj 2 MG/ML: INTRAMUSCULAR | Qty: 1 | Status: AC

## 2012-01-27 MED FILL — Nicotine TD Patch 24HR 21 MG/24HR: TRANSDERMAL | Qty: 1 | Status: AC

## 2012-01-27 MED FILL — Morphine Sulfate Inj 2 MG/ML: INTRAMUSCULAR | Qty: 1 | Status: AC

## 2012-01-27 NOTE — Progress Notes (Signed)
*  PRELIMINARY RESULTS* Echocardiogram 2D Echocardiogram has been performed.  Caswell Corwin 01/27/2012, 11:07 AM

## 2012-01-27 NOTE — Evaluation (Signed)
Clinical/Bedside Swallow Evaluation Patient Details  Name: Mathew Brady MRN: [id number] Date of Birth: 01/30/1966  Today's Date: 01/27/2012 Time: 1430-1505 SLP Time Calculation (min): 35 min  Past Medical History:  Past Medical History  Diagnosis Date  . Hypertension   . Arthritis   . Fibromyalgia    Past Surgical History: History reviewed. No pertinent past surgical history.  HPI:  46 yo male presented to [facility] yesterday with stroke-like symptoms. CT head and MRI head/neck are negative. He presents with sound substitutions t/k, d/g. He describes pain with swallow/chewing.   Assessment / Plan / Recommendation Clinical Impression  Oral motor exam inconsistent (decreased diadochokinetic rate, fronting sounds (t/k etc), and variably able to mobilize tongue laterally), however it appears that pt may be in pain orally. He states that he has several teeth that need to be pulled, but that he cannot afford it. Will initiate puree diet with thin liquids. Consider offering pain medication prior to meal to see if this helps. Pharyngeal swallow appears WFL.    Aspiration Risk  None    Diet Recommendation Dysphagia 1 (Puree);Thin liquid   Liquid Administration via: Cup;Straw Medication Administration: Whole meds with liquid Supervision: Patient able to self feed Postural Changes and/or Swallow Maneuvers: Seated upright 90 degrees;Upright 30-60 min after meal    Other  Recommendations Oral Care Recommendations: Oral care BID Other Recommendations: Clarify dietary restrictions   Follow Up Recommendations  None    Frequency and Duration min 2x/week  1 week   Pertinent Vitals/Pain     SLP Swallow Goals Patient will consume recommended diet without observed clinical signs of aspiration with: Independent assistance Patient will utilize recommended strategies during swallow to increase swallowing safety with: Independent assistance   Swallow Study Prior Functional Status  Lives With:  Spouse Vocation: Works at home (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 CT head and MRI head/neck are negative. He presents with sound substitutions t/k, d/g. He describes pain with swallow/chewing. Type of Study: Bedside swallow evaluation Previous Swallow Assessment: N/A Diet Prior to this Study: NPO Temperature Spikes Noted: No Respiratory Status: Room air History of Recent Intubation: No Behavior/Cognition: Alert;Cooperative;Pleasant mood Oral Cavity - Dentition: Adequate natural dentition (pt reports significant gum/molar pain L/R) Self-Feeding Abilities: Able to feed self Patient Positioning: Upright in bed (sitting at EOB) Baseline Vocal Quality: Clear Volitional Cough: Strong Volitional Swallow: Able to elicit    Oral/Motor/Sensory Function Overall Oral Motor/Sensory Function:  (somewhat inconsistent) Labial Symmetry: Within Functional Limits Labial Strength: Within Functional Limits Labial Sensation: Within Functional Limits Lingual ROM: Reduced right;Reduced left Lingual Symmetry: Abnormal symmetry left Lingual Strength: Within Functional Limits Lingual Sensation: Within Functional Limits Facial ROM: Within Functional Limits Facial Symmetry: Within Functional Limits Facial Strength: Within Functional Limits Facial Sensation: Within Functional Limits Velum: Within Functional Limits Mandible:  (reduced ROM with jaw opening cue)   Ice Chips Ice chips: Within functional limits Other Comments: Cold sensation painful to teeth/gums   Thin Liquid Thin Liquid: Within functional limits Presentation: Straw;Cup;Self Fed    Nectar Thick Nectar Thick Liquid: Not tested   Honey Thick Honey Thick Liquid: Not tested   Puree Puree: Within functional limits Other Comments: Pt held bolus orally initially likely due to pain   Solid Solid: Impaired Oral Phase Impairments: Other (comment) (painful to  masticate) Oral Phase Functional Implications: Oral holding Other Comments: Pt expectorated plain cracker because he says he was scared  to swallow, but tolerated when presented in applesauce (due to pain with mastication).    Thank you,  Havery Moros, CCC-SLP [phone]  PORTER,DABNEY 01/27/2012,3:24 PM

## 2012-01-27 NOTE — Plan of Care (Signed)
Problem: Discharge Progression Outcomes Goal: INR monitor plan established Outcome: Not Applicable Date Met:  01/27/12 Stroke ruled out Goal: Other Discharge Outcomes/Goals Outcome: Completed/Met Date Met:  01/27/12 Discharge instructions read to pt alone for privacy of information  Pt verbalized understanding of all instructions IV removed from rt forearm site benign cath tip intact  discharged to home with family

## 2012-01-27 NOTE — H&P (Signed)
Hospital Admission Note Date: 01/27/2012  Patient name: Mathew Brady Medical record number: [id number] Date of birth: 1965-09-30 Age: 46 y.o. Gender: male PCP: Dr. Mickle Plumb  Attending physician: Christiane Ha, MD  Chief Complaint: Difficulty talking and swallowing  History of Present Illness:  Mathew Brady is an 46 y.o. male who presents to the hospital with complaints of left-sided weakness which has waxed and waned over the past week. He also had left facial numbness during that time. He also had difficulty talking and swallowing since yesterday. His wife noted that his face seemed a little droopy on one side, and he seemed to be drooling. He has not seen a doctor since the start of his symptoms. He has not fallen. He reports that it is hard to use a straw or drag him a cigarette. He smokes a pack to a pack and a half of cigarettes a day. Denies drug use.  Past Medical History  Diagnosis Date  . Hypertension   . Arthritis   . Fibromyalgia     Meds: Hydrocodone/APAP 10/325 4 times a day, Xanax 1 mg twice a day, tramadol as needed, hydrochlorothiazide daily  Allergies: Gabapentin History   Social History  . Marital Status: Married    Spouse Name: N/A    Number of Children: N/A  . Years of Education: N/A   Occupational History  . Not on file.   Social History Main Topics  . Smoking status: Current Everyday Smoker  . Smokeless tobacco: Not on file  . Alcohol Use: No  . Drug Use: No  . Sexually Active:    Other Topics Concern  . Not on file   Social History Narrative  . No narrative on file   No family history on file. History reviewed. No pertinent past surgical history.  Review of Systems: Systems reviewed and as per HPI, otherwise negative.  Physical Exam: Blood pressure 131/82, pulse 69, temperature 97.8 F (36.6 C), temperature source Oral, resp. rate 18, height 5\' 6"  (1.676 m), weight 90.719 kg (200 lb), SpO2 93.00%. BP 139/90  Pulse 95  Temp 98.1 F  (36.7 C) (Oral)  Resp 18  Ht 5\' 6"  (1.676 m)  Wt 90.719 kg (200 lb)  BMI 32.28 kg/m2  SpO2 100%  General Appearance:    Alert, cooperative, anxious appearing, appears stated age  Head:    Normocephalic, without obvious abnormality, atraumatic  Eyes:    PERRL, conjunctiva/corneas clear, EOM's intact, fundi    benign, both eyes       Ears:    Normal TM's and external ear canals, both ears  Nose:   Nares normal, septum midline, mucosa normal, no drainage    or sinus tenderness  Throat:   Lips, mucosa, and tongue normal; teeth and gums normal.no facial droop   Neck:   Supple, symmetrical, trachea midline, no adenopathy;       thyroid:  No enlargement/tenderness/nodules; no carotid   bruit or JVD  Back:     Symmetric, no curvature, ROM normal, no CVA tenderness  Lungs:     Clear to auscultation bilaterally, respirations unlabored  Chest wall:    No tenderness or deformity  Heart:    Regular rate and rhythm, S1 and S2 normal, no murmur, rub   or gallop  Abdomen:     Soft, non-tender, bowel sounds active all four quadrants,    no masses, no organomegaly  Genitalia:   deferred   Rectal:   deferred   Extremities:  Extremities normal, atraumatic, no cyanosis or edema  Pulses:   2+ and symmetric all extremities  Skin:   Skin color, texture, turgor normal, no rashes or lesions  Lymph nodes:   Cervical, supraclavicular, and axillary nodes normal  Neurologic:   speech is fluent, thick but not slurred. More like a speech impediment. CNII-XII intact, but patient appears to be intentionally trying to droop one side of his face when asked to smile. left arm and leg strength noted to be normal when moving about in bed, but variable when performing neurologic examination. Possibly slightly weaker. Not consistent. I watched the patient drink water while nurse performing swallow screen. He initially held the water in his mouth for an extended period of time but then swallowed it without difficulty. He,  however, he said it was hard to swallow. Some past pointing on the left     Lab results: Basic Metabolic Panel:  Basename 01/26/12 1555  NA 133*  K 3.3*  CL 98  CO2 29  GLUCOSE 95  BUN 11  CREATININE 0.88  CALCIUM 9.8  MG --  PHOS --   Liver Function Tests:  Basename 01/26/12 1555  AST 13  ALT 12  ALKPHOS 79  BILITOT 0.3  PROT 6.9  ALBUMIN 3.8   No results found for this basename: LIPASE:2,AMYLASE:2 in the last 72 hours No results found for this basename: AMMONIA:2 in the last 72 hours CBC:  Basename 01/26/12 1555  WBC 6.8  NEUTROABS 3.5  HGB 14.0  HCT 40.2  MCV 95.0  PLT 252   Cardiac Enzymes:  Basename 01/26/12 1555  CKTOTAL 109  CKMB 2.6  CKMBINDEX --  TROPONINI <0.30   BNP: No results found for this basename: PROBNP:3 in the last 72 hours D-Dimer: No results found for this basename: DDIMER:2 in the last 72 hours CBG:  Basename 01/26/12 1543  GLUCAP 91   Hemoglobin A1C: No results found for this basename: HGBA1C in the last 72 hours Fasting Lipid Panel:  Basename 01/27/12 0535  CHOL 221*  HDL 30*  LDLCALC UNABLE TO CALCULATE IF TRIGLYCERIDE OVER 400 mg/dL  TRIG 161*  CHOLHDL 7.4  LDLDIRECT --   Thyroid Function Tests: No results found for this basename: TSH,T4TOTAL,FREET4,T3FREE,THYROIDAB in the last 72 hours Anemia Panel: No results found for this basename: VITAMINB12,FOLATE,FERRITIN,TIBC,IRON,RETICCTPCT in the last 72 hours Coagulation:  Basename 01/26/12 1555  LABPROT 13.5  INR 1.01   Urine Drug Screen: Drugs of Abuse     Component Value Date/Time   LABOPIA POSITIVE* 01/26/2012 1556   COCAINSCRNUR NONE DETECTED 01/26/2012 1556   LABBENZ POSITIVE* 01/26/2012 1556   AMPHETMU NONE DETECTED 01/26/2012 1556   THCU NONE DETECTED 01/26/2012 1556   LABBARB NONE DETECTED 01/26/2012 1556    Imaging results:  Ct Head Wo Contrast  01/26/2012  *RADIOLOGY REPORT*  Clinical Data: Aphasia, headache  CT HEAD WITHOUT CONTRAST  Technique:   Contiguous axial images were obtained from the base of the skull through the vertex without contrast.  Comparison: None.  Findings: Bony calvarium appears intact.  No mass effect or midline shift is noted.  Ventricular size is within normal limits.  No evidence of mass, hemorrhage or acute infarction is noted.  IMPRESSION: No acute intracranial abnormality seen.  Original Report Authenticated By: Venita Sheffield., M.D.   EKG shows normal sinus rhythm  Assessment & Plan: Principal Problem: Reported left-sided weakness, left facial numbness, speech difficulty: Physical examination is variable, and complaints not completely characteristic of an acute stroke.  However, will admit for stroke workup with MRI, echo, carotid Dopplers, PT OT and speech evaluation. Aspirin therapy. Active Problems:  Benign hypertension, hold hydrochlorothiazide for now  TOBACCO DEPENDENCE, counseled against  FIBROMYALGIA, FIBROMYOSITIS  Anxiety  Hypokalemia, replete   Bethannie Iglehart L 01/27/2012, 7:54 AM

## 2012-01-27 NOTE — Evaluation (Signed)
Physical Therapy Evaluation Patient Details Name: Mathew Brady MRN: [id number] DOB: [birth year] Today's Date: 01/27/2012 Time: 7829-5621 PT Time Calculation (min): 55 min  PT Assessment / Plan / Recommendation Clinical Impression    Pt was seen for a functional eval.  He was quite tearful initially, reporting feeling overwhelmed from caring for his wife the past 7 years.  Eval was inconsistent...on testing, he demonstrated decreased strength, coordination of the L extremeties.  However, when he was focused on other things, he moved the L extremeties quite normally. His gait is abnormal but he never lost his balance.  Since his MRI is negative, I am not going to recommend any further PT.            PT Assessment  Patent does not need any further PT services    Follow Up Recommendations  No PT follow up    Barriers to Discharge        Equipment Recommendations  None recommended by PT    Recommendations for Other Services     Frequency      Precautions / Restrictions Precautions Precautions: Fall Restrictions Weight Bearing Restrictions: No   Pertinent Vitals/Pain       Mobility  Bed Mobility Bed Mobility: Left Sidelying to Sit;Sit to Supine Left Sidelying to Sit: 6: Modified independent (Device/Increase time);HOB flat Sit to Supine: 6: Modified independent (Device/Increase time);HOB flat Transfers Sit to Stand: 4: Min assist Stand to Sit: 4: Min assist Ambulation/Gait Ambulation/Gait Assistance: 4: Min guard Ambulation Distance (Feet): 150 Feet Assistive device: None Ambulation/Gait Assistance Details: pt ambulates on his heels until this is pointed out to him...then he walks normally on R foot and heel of L foot Gait Pattern: Step-through pattern;Decreased hip/knee flexion - left;Decreased hip/knee flexion - right Gait velocity: slow Stairs: No Wheelchair Mobility Wheelchair Mobility: No    Exercises     PT Diagnosis:    PT Problem List:   PT Treatment  Interventions:     PT Goals    Visit Information  Last PT Received On: 01/27/12 Assistance Needed: +1 PT/OT Co-Evaluation/Treatment: Yes    Subjective Data  Subjective: very worried about having to take care of his wife Patient Stated Goal: none stated   Prior Functioning  Home Living Lives With: Spouse Type of Home: Apartment (2nd story) Home Access: Stairs to enter Secretary/administrator of Steps: 12 Entrance Stairs-Rails: Right Home Layout: One level Home Adaptive Equipment: None Prior Function Level of Independence: Independent Able to Take Stairs?: Yes Driving: Yes Vocation: Unemployed Communication Communication: Expressive difficulties Dominant Hand: Right    Cognition  Overall Cognitive Status: Appears within functional limits for tasks assessed/performed Arousal/Alertness: Awake/alert Orientation Level: Appears intact for tasks assessed Behavior During Session: Other (comment) (crying during beginning of eval)    Extremity/Trunk Assessment Right Upper Extremity Assessment RUE ROM/Strength/Tone: Within functional levels RUE Sensation: WFL - Light Touch RUE Coordination: WFL - gross/fine motor Left Upper Extremity Assessment LUE ROM/Strength/Tone: Deficits LUE ROM/Strength/Tone Deficits: A/ROM WFL in all ranges. P/ROM WFL in all ranges. MMT: 3/5. Patient very slow with motor processing and movement patterns. LUE Sensation: Deficits LUE Sensation Deficits: Patient reports numbness and tingling in LUE. LUE Coordination: Deficits LUE Coordination Deficits: impaired gross/fine motor coordination. Right Lower Extremity Assessment RLE ROM/Strength/Tone: Within functional levels RLE Sensation: WFL - Light Touch;WFL - Proprioception RLE Coordination: WFL - gross motor Left Lower Extremity Assessment LLE ROM/Strength/Tone: Deficits LLE ROM/Strength/Tone Deficits: demonstrates strength at 3/5 with decreased coordination LLE Sensation: WFL - Light Touch LLE  Coordination:  (on observation, pt moves LLE normally when doing something e)   Balance Balance Balance Assessed: Yes Dynamic Sitting Balance Dynamic Sitting - Balance Support: No upper extremity supported Dynamic Sitting - Level of Assistance: 7: Independent Static Standing Balance Static Standing - Balance Support: No upper extremity supported Static Standing - Level of Assistance: 4: Min assist  End of Session PT - End of Session Equipment Utilized During Treatment: Gait belt Activity Tolerance: Patient tolerated treatment well Patient left: in bed;with call bell/phone within reach;with nursing in room Nurse Communication: Mobility status  GP     Konrad Penta 01/27/2012, 4:55 PM

## 2012-01-27 NOTE — Discharge Summary (Signed)
Physician Discharge Summary  Patient ID: Mathew Brady MRN: [id number] DOB/AGE: [birth year] 46 y.o.  Admit date: 01/26/2012 Discharge date: 01/27/2012  Discharge Diagnoses:  Principal Problem:  *Left-sided weakness Active Problems:  Opiate dependence  Benzodiazepine dependence  Benign hypertension  TOBACCO DEPENDENCE  FIBROMYALGIA, FIBROMYOSITIS  Anxiety  Hypokalemia  Dental caries  Aphthous ulcer speech difficulty  Medication List  As of 01/27/2012  3:40 PM   STOP taking these medications         HYDROcodone-acetaminophen 10-325 MG per tablet         TAKE these medications         ALPRAZolam 0.25 MG tablet   Commonly known as: XANAX   Take 1 tablet (0.25 mg total) by mouth 2 (two) times daily as needed for anxiety.      aspirin EC 81 MG tablet   Take 81 mg by mouth every morning.      hydrochlorothiazide 12.5 MG capsule   Commonly known as: MICROZIDE   Take 12.5 mg by mouth every morning.      HYDROcodone-acetaminophen 5-325 MG per tablet   Commonly known as: NORCO/VICODIN   Take 1 tablet by mouth every 8 (eight) hours as needed for pain.            Discharge Orders    Future Orders Please Complete By Expires   Diet - low sodium heart healthy      Activity as tolerated - No restrictions         Disposition: home  Discharged Condition: stable  Consults:  social work  Labs:   Results for orders placed during the hospital encounter of 01/26/12 (from the past 48 hour(s))  GLUCOSE, CAPILLARY     Status: Normal   Collection Time   01/26/12  3:43 PM      Component Value Range Comment   Glucose-Capillary 91  70 - 99 mg/dL   PROTIME-INR     Status: Normal   Collection Time   01/26/12  3:55 PM      Component Value Range Comment   Prothrombin Time 13.5  11.6 - 15.2 seconds    INR 1.01  0.00 - 1.49   APTT     Status: Normal   Collection Time   01/26/12  3:55 PM      Component Value Range Comment   aPTT 37  24 - 37 seconds   CBC     Status: Normal   Collection Time   01/26/12  3:55 PM      Component Value Range Comment   WBC 6.8  4.0 - 10.5 K/uL    RBC 4.23  4.22 - 5.81 MIL/uL    Hemoglobin 14.0  13.0 - 17.0 g/dL    HCT 40.9  81.1 - 91.4 %    MCV 95.0  78.0 - 100.0 fL    MCH 33.1  26.0 - 34.0 pg    MCHC 34.8  30.0 - 36.0 g/dL    RDW 78.2  95.6 - 21.3 %    Platelets 252  150 - 400 K/uL   DIFFERENTIAL     Status: Normal   Collection Time   01/26/12  3:55 PM      Component Value Range Comment   Neutrophils Relative 52  43 - 77 %    Neutro Abs 3.5  1.7 - 7.7 K/uL    Lymphocytes Relative 40  12 - 46 %    Lymphs Abs 2.7  0.7 - 4.0 K/uL  Monocytes Relative 7  3 - 12 %    Monocytes Absolute 0.5  0.1 - 1.0 K/uL    Eosinophils Relative 1  0 - 5 %    Eosinophils Absolute 0.1  0.0 - 0.7 K/uL    Basophils Relative 0  0 - 1 %    Basophils Absolute 0.0  0.0 - 0.1 K/uL   COMPREHENSIVE METABOLIC PANEL     Status: Abnormal   Collection Time   01/26/12  3:55 PM      Component Value Range Comment   Sodium 133 (*) 135 - 145 mEq/L    Potassium 3.3 (*) 3.5 - 5.1 mEq/L    Chloride 98  96 - 112 mEq/L    CO2 29  19 - 32 mEq/L    Glucose, Bld 95  70 - 99 mg/dL    BUN 11  6 - 23 mg/dL    Creatinine, Ser 4.09  0.50 - 1.35 mg/dL    Calcium 9.8  8.4 - 81.1 mg/dL    Total Protein 6.9  6.0 - 8.3 g/dL    Albumin 3.8  3.5 - 5.2 g/dL    AST 13  0 - 37 U/L    ALT 12  0 - 53 U/L    Alkaline Phosphatase 79  39 - 117 U/L    Total Bilirubin 0.3  0.3 - 1.2 mg/dL    GFR calc non Af Amer >90  >90 mL/min    GFR calc Af Amer >90  >90 mL/min   CK TOTAL AND CKMB     Status: Normal   Collection Time   01/26/12  3:55 PM      Component Value Range Comment   Total CK 109  7 - 232 U/L    CK, MB 2.6  0.3 - 4.0 ng/mL    Relative Index 2.4  0.0 - 2.5   TROPONIN I     Status: Normal   Collection Time   01/26/12  3:55 PM      Component Value Range Comment   Troponin I <0.30  <0.30 ng/mL   URINE RAPID DRUG SCREEN (HOSP PERFORMED)     Status: Abnormal   Collection  Time   01/26/12  3:56 PM      Component Value Range Comment   Opiates POSITIVE (*) NONE DETECTED    Cocaine NONE DETECTED  NONE DETECTED    Benzodiazepines POSITIVE (*) NONE DETECTED    Amphetamines NONE DETECTED  NONE DETECTED    Tetrahydrocannabinol NONE DETECTED  NONE DETECTED    Barbiturates NONE DETECTED  NONE DETECTED   LIPID PANEL     Status: Abnormal   Collection Time   01/27/12  5:35 AM      Component Value Range Comment   Cholesterol 221 (*) 0 - 200 mg/dL    Triglycerides 914 (*) <150 mg/dL    HDL 30 (*) >78 mg/dL    Total CHOL/HDL Ratio 7.4      VLDL UNABLE TO CALCULATE IF TRIGLYCERIDE OVER 400 mg/dL  0 - 40 mg/dL    LDL Cholesterol UNABLE TO CALCULATE IF TRIGLYCERIDE OVER 400 mg/dL  0 - 99 mg/dL     Diagnostics:  Ct Head Wo Contrast  01/26/2012  *RADIOLOGY REPORT*  Clinical Data: Aphasia, headache  CT HEAD WITHOUT CONTRAST  Technique:  Contiguous axial images were obtained from the base of the skull through the vertex without contrast.  Comparison: None.  Findings: Bony calvarium appears intact.  No mass effect or  midline shift is noted.  Ventricular size is within normal limits.  No evidence of mass, hemorrhage or acute infarction is noted.  IMPRESSION: No acute intracranial abnormality seen.  Original Report Authenticated By: Venita Sheffield., M.D.   Mr [facility] Wo Contrast  01/27/2012  *RADIOLOGY REPORT*  Clinical Data: Headaches with aphasia and left-sided weakness.  MRA HEAD WITHOUT CONTRAST  Technique: Angiographic images of the Circle of Willis were obtained using MRA technique without intravenous contrast.  Comparison: MRI brain from the same day.  Findings: Signal loss within the anterior genu of the left cavernous carotid artery is felt be artifactual. The internal carotid arteries are otherwise within normal limits bilaterally. The study is moderately degraded by patient motion, predominately affecting the medial and small vessels.  The A1 and M1 segments are normal.   Segmental irregularity is noted within MCA branch vessels bilaterally.  This is not consistent with the appearance of the brain parenchyma. The anterior communicating artery is patent.  The vertebral arteries are codominant.  The left PICA origin is below the field of view.  The right PICA origin is visualized and normal.  The basilar artery is normal.  Both posterior cerebral arteries originate from basilar tip.  There is some signal loss within the distal PCA branch vessels.  The  IMPRESSION:  1.  Medial and small vessel signal loss is felt be artifactual due to significant patient motion. 2.  Signal loss within the anterior genu of the left cavernous carotid artery is also felt be artifactual. 3.  No significant proximal stenosis, aneurysm, or branch vessel occlusion.  Original Report Authenticated By: Jamesetta Orleans. MATTERN, M.D.   Mr Brain Wo Contrast  01/27/2012  *RADIOLOGY REPORT*  Clinical Data: Headaches.  Aphasia and left-sided weakness.  MRI HEAD WITHOUT CONTRAST  Technique:  Multiplanar, multiecho pulse sequences of the brain and surrounding structures were obtained according to standard protocol without intravenous contrast.  Comparison: CT head without to contrast 01/26/2012.  Findings: No acute intracranial abnormalities are present. Specifically, there is no evidence for acute infarct, hemorrhage, mass, hydrocephalus, or significant extra-axial fluid collection. Flow is present in the major intracranial arteries.  The globes and orbits are intact.  Mild mucosal thickening is present in the anterior ethmoid air cells.  The mastoid air cells are clear.  IMPRESSION: 1.  Normal MRI appearance of the brain. 2.  Minimal ethmoid sinus disease.  Original Report Authenticated By: Jamesetta Orleans. MATTERN, M.D.   US Carotid Duplex Bilateral  01/27/2012  *RADIOLOGY REPORT*  Clinical Data: Dysphagia, limited communication, possible history of stroke  BILATERAL CAROTID DUPLEX ULTRASOUND  Technique: Wallace Cullens scale  imaging, color Doppler and duplex ultrasound was performed of bilateral carotid and vertebral arteries in the neck.  Comparison:  Head CT - 01/27/2012; 01/26/2012;  Criteria:  Quantification of carotid stenosis is based on velocity parameters that correlate the residual internal carotid diameter with NASCET-based stenosis levels, using the diameter of the distal internal carotid lumen as the denominator for stenosis measurement.  The following velocity measurements were obtained:                   PEAK SYSTOLIC/END DIASTOLIC RIGHT ICA:                        111/26cm/sec CCA:                        128/33cm/sec SYSTOLIC ICA/CCA RATIO:  0.86 DIASTOLIC ICA/CCA RATIO:    0.80 ECA:                        01/1951cm/sec  LEFT ICA:                        111/34cm/sec CCA:                        145/28cm/sec SYSTOLIC ICA/CCA RATIO:     0.76 DIASTOLIC ICA/CCA RATIO:    1.18 ECA:                        166cm/sec  Findings:  RIGHT CAROTID ARTERY: There is a very minimal amount of intimal wall thickening/non-echogenic plaque within the right carotid bulb (image 17) which does not result in elevated peak systolic velocities within the right internal carotid artery to suggest hemodynamic significance.  RIGHT VERTEBRAL ARTERY:  Antegrade flow  LEFT CAROTID ARTERY: There is a minimal amount of intimal thickening/ non-echogenic plaque within the left carotid bulb extending primarily to involve the origin of the left external carotid artery and not resulting in elevated peak systolic velocities within the left internal carotid artery to suggest a hemodynamically significant stenosis.  LEFT VERTEBRAL ARTERY:  Antegrade flow  IMPRESSION:  Minimal intimal wall thickening and non-echogenic atherosclerotic plaque within the bilateral carotid bulbs, left subjectively more than right, not resulting in hemodynamically significant stenosis.  Original Report Authenticated By: Waynard Reeds, M.D.    Procedures: none  EKG:  NSR  Echo: Left ventricle: The cavity size was normal. Wall thickness was normal. Systolic function was normal. The estimated ejection fraction was in the range of 55% to 60%. Regional wall motion abnormalities cannot be excluded.   Full Code   Hospital Course: See H&P for admission details.  The patient is a 46 year old white male who presented with left sided weakness for about a week.  Also left facial numbness.  He complained of difficulty speaking and eating the day prior to admission.  On exam, his speech was fluent, but thick. Left strength exam was inconsistent with give-way. Subjective left facial and left limb numbness.    Patient was admitted for stroke workup.  Workup was negative, and patient was noted to have no weakness when distracted.  Patient was confronted and admitted that he had run out of his opiates and benzodiazepines and had no further symptoms, that he had bought his pills on the street.  Social work was consulted for substance abuse treatment options.    Discharge Exam:  Blood pressure 139/90, pulse 95, temperature 98.1 F (36.7 C), temperature source Oral, resp. rate 18, height 5\' 6"  (1.676 m), weight 90.719 kg (200 lb), SpO2 100.00%.  Neuro: cranial nerves intact.  Speech pattern variable. Strength intact. Psychiatric:  Tearful. cooperative  Signed: Christiane Ha 01/27/2012, 3:40 PM

## 2012-01-27 NOTE — Evaluation (Signed)
Occupational Therapy Evaluation Patient Details Name: Mathew Brady MRN: [id number] DOB: 06-08-1966 Today's Date: 01/27/2012 Time: 4098-1191 OT Time Calculation (min): 36 min  OT Assessment / Plan / Recommendation Clinical Impression  patient is a 46 y/o male presenting to acute OT with stroke-like symptoms. CT and MRI are negative. At this time, patient will benefit from acute OT to increase functional performance during ADL tasks.    OT Assessment  Patient needs continued OT Services    Follow Up Recommendations  Home health OT       Equipment Recommendations  Other (comment) (TBD)    Recommendations for Other Services Other (comment) (Neuropsychologist consult)  Frequency  Min 2X/week    Precautions / Restrictions Precautions Precautions: Fall   Pertinent Vitals/Pain Pain reported in neck on left side.    ADL  Grooming: Simulated;Supervision/safety Where Assessed - Grooming: Unsupported sitting Upper Body Bathing: Minimal assistance;Simulated Where Assessed - Upper Body Bathing: Unsupported sitting Where Assessed - Upper Body Dressing: Supported sitting Lower Body Dressing: Minimal assistance;Simulated Toilet Transfer: Simulated;Minimal assistance (with +1 assistance for safety.) Toilet Transfer Method: Sit to stand Toilet Transfer Equipment:  (to bed.) Transfers/Ambulation Related to ADLs: Patient transfers at a Min Assist level; stand pivot without assistive device.    OT Diagnosis: Generalized weakness  OT Problem List: Decreased strength;Decreased coordination;Decreased activity tolerance;Impaired balance (sitting and/or standing);Pain;Impaired UE functional use;Impaired sensation OT Treatment Interventions: Self-care/ADL training;Therapeutic exercise;Energy conservation;Manual therapy;Patient/family education;Balance training;Therapeutic activities;Visual/perceptual remediation/compensation   OT Goals Acute Rehab OT Goals OT Goal Formulation: With patient Time For  Goal Achievement: 02/10/12 Potential to Achieve Goals: Good ADL Goals Pt Will Perform Lower Body Bathing: with supervision;Sit to stand from bed ADL Goal: Lower Body Bathing - Progress: Goal set today Pt Will Perform Lower Body Dressing: with supervision;Sit to stand from bed ADL Goal: Lower Body Dressing - Progress: Goal set today Pt Will Transfer to Toilet: with min assist;Regular height toilet ADL Goal: Toilet Transfer - Progress: Goal set today Pt Will Perform Toileting - Clothing Manipulation: with min assist;Standing ADL Goal: Toileting - Clothing Manipulation - Progress: Goal set today Arm Goals Pt Will Complete Theraband Exer: with supervision, verbal cues required/provided;to increase strength;1 set;5 reps;Bilateral upper extremities;Level 1 Theraband Arm Goal: Theraband Exercises - Progress: Goal set today Pt Will Complete Theraputty Exer: with supervision, verbal cues required/provided;Bilateral upper extremities;to increase strength;Min resistance putty Arm Goal: Theraputty Exercises - Progress: Goal set today Additional Arm Goal #1: Patient will increase standing balance while performing a table top activity with Min Assist for balance to increase LB bathing and dressing. Arm Goal: Additional Goal #1 - Progress: Goal set today Miscellaneous OT Goals Miscellaneous OT Goal #1: Patient will decrease pain from a 7/10 in neck to 3/10 to be able to participate in functional ADL tasks. OT Goal: Miscellaneous Goal #1 - Progress: Goal set today  Visit Information  Last OT Received On: 01/27/12 Assistance Needed: +1 PT/OT Co-Evaluation/Treatment: Yes    Subjective Data  Subjective: "My left arm hurts." Patient Stated Goal: To go home.   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) Communication Communication: Expressive difficulties Dominant Hand: Right   Vision - Assessment Eye Alignment: Within Functional Limits Vision Assessment: Vision tested Ocular Range of Motion: Within Functional Limits Alignment/Gaze Preference: Within Defined Limits Tracking/Visual Pursuits: Requires cues, head turns, or  add eye shifts to track Saccades: Within functional limits Visual Fields: Left visual field deficit Perception Perception: Impaired  Cognition  Overall Cognitive Status: Appears within functional limits for tasks assessed/performed Arousal/Alertness: Awake/alert Orientation Level: Appears intact for tasks assessed Behavior During Session: Other (comment) (crying during beginning of eval)    Extremity/Trunk Assessment Right Upper Extremity Assessment RUE ROM/Strength/Tone: Within functional levels RUE Sensation: WFL - Light Touch RUE Coordination: WFL - gross/fine motor Left Upper Extremity Assessment LUE ROM/Strength/Tone: Deficits LUE ROM/Strength/Tone Deficits: A/ROM WFL in all ranges. P/ROM WFL in all ranges. MMT: 3/5. Patient very slow with motor processing and movement patterns. LUE Sensation: Deficits LUE Sensation Deficits: Patient reports numbness and tingling in LUE. LUE Coordination: Deficits LUE Coordination Deficits: impaired gross/fine motor coordination.   Mobility Transfers Transfers: Sit to Stand;Stand to Sit Sit to Stand: From bed;4: Min assist (with +1 for safety) Stand to Sit: To bed;4: Min assist (with +1 for safety)         End of Session OT - End of Session Equipment Utilized During Treatment: Gait belt Activity Tolerance: Patient tolerated treatment well Patient left: in bed;Other (comment);with family/visitor present (with speech therapist)    Limmie Patricia, OTR/L 01/27/2012, 3:49 PM

## 2012-01-27 NOTE — Clinical Social Work Psychosocial (Signed)
Clinical Social Work Department BRIEF PSYCHOSOCIAL ASSESSMENT 01/27/2012  Patient:  Mathew Brady, Mathew Brady     Account Number:  [id number]     Admit date:  01/26/2012  Clinical Social Worker:  Nancie Neas  Date/Time:  01/27/2012 04:10 PM  Referred by:  Physician  Date Referred:  01/27/2012 Referred for  Substance Abuse   Other Referral:   Interview type:  Patient Other interview type:    PSYCHOSOCIAL DATA Living Status:  WIFE Admitted from facility:   Level of care:   Primary support name:  Hilda Lias Primary support relationship to patient:  SPOUSE Degree of support available:   Pt's parents and cousin are supportive. Wife has medical issues.    CURRENT CONCERNS Current Concerns  Substance Abuse   Other Concerns:    SOCIAL WORK ASSESSMENT / PLAN CSW met with pt at bedside following MD referral. Pt alert and oriented and reports he lives with his wife whom he is provides extensive care for due to her medical issues. Pt states they just moved to a two bedroom apartment. He expressed that he has been under a lot of stress with these issues and has been taking more pain medication than prescribed. Pt said he had this issue years ago but for 8 years has been clean. The last few months he reports he has been taking more medication and is concerned. He is interested in outpatient follow up. CSW also encouraged pt to make an outpatient appointment to discuss his stress. He said he has tried multiple times to apply for disability but has been denied and thinks he will try again.   Assessment/plan status:  Referral to [facility] Other assessment/ plan:   Information/referral to community resources:   [facility]    PATIENT'S/FAMILY'S RESPONSE TO PLAN OF CARE: Pt accepted referral to [facility] for outpatient follow up and appeared to be genuine in his request for help as he also initiated a conversation with MD about abusing pain medication. Information also on pt's d/c instructions. Pt to d/c  today. CSW will sign off.        Derenda Fennel, [place] [phone]

## 2012-02-06 NOTE — Progress Notes (Signed)
UR chart review completed.  

## 2012-02-10 ENCOUNTER — Emergency Department (HOSPITAL_COMMUNITY)
Admission: EM | Admit: 2012-02-10 | Discharge: 2012-02-11 | Disposition: A | Discharge status: Home / Self Care | Payer: Self-pay | Attending: Emergency Medicine | Admitting: Emergency Medicine

## 2012-02-10 ENCOUNTER — Encounter (HOSPITAL_COMMUNITY): Payer: Self-pay | Admitting: *Deleted

## 2012-02-10 DIAGNOSIS — I10 Essential (primary) hypertension: Secondary | ICD-10-CM | POA: Insufficient documentation

## 2012-02-10 DIAGNOSIS — Z79899 Other long term (current) drug therapy: Secondary | ICD-10-CM | POA: Insufficient documentation

## 2012-02-10 DIAGNOSIS — Z8739 Personal history of other diseases of the musculoskeletal system and connective tissue: Secondary | ICD-10-CM | POA: Insufficient documentation

## 2012-02-10 DIAGNOSIS — IMO0001 Reserved for inherently not codable concepts without codable children: Secondary | ICD-10-CM | POA: Insufficient documentation

## 2012-02-10 DIAGNOSIS — F17200 Nicotine dependence, unspecified, uncomplicated: Secondary | ICD-10-CM | POA: Insufficient documentation

## 2012-02-10 DIAGNOSIS — Z7982 Long term (current) use of aspirin: Secondary | ICD-10-CM | POA: Insufficient documentation

## 2012-02-10 DIAGNOSIS — R45851 Suicidal ideations: Secondary | ICD-10-CM | POA: Insufficient documentation

## 2012-02-10 LAB — RAPID URINE DRUG SCREEN, HOSP PERFORMED
Opiates: POSITIVE — AB
Tetrahydrocannabinol: NOT DETECTED

## 2012-02-10 LAB — CBC
HCT: 44.4 % (ref 39.0–52.0)
Hemoglobin: 15.4 g/dL (ref 13.0–17.0)
MCH: 33.1 pg (ref 26.0–34.0)
MCV: 95.5 fL (ref 78.0–100.0)
Platelets: 293 10*3/uL (ref 150–400)
RBC: 4.65 MIL/uL (ref 4.22–5.81)
WBC: 8.2 10*3/uL (ref 4.0–10.5)

## 2012-02-10 LAB — COMPREHENSIVE METABOLIC PANEL
AST: 17 U/L (ref 0–37)
BUN: 9 mg/dL (ref 6–23)
CO2: 31 mEq/L (ref 19–32)
Chloride: 98 mEq/L (ref 96–112)
Creatinine, Ser: 0.93 mg/dL (ref 0.50–1.35)
GFR calc Af Amer: >90 mL/min (ref >90)
GFR calc non Af Amer: >90 mL/min (ref >90)
Glucose, Bld: 95 mg/dL (ref 70–99)
Total Bilirubin: 0.3 mg/dL (ref 0.3–1.2)

## 2012-02-10 MED ORDER — NICOTINE 21 MG/24HR TD PT24
21.0000 mg | MEDICATED_PATCH | Freq: Every day | TRANSDERMAL | Status: DC
  Administered 2012-02-11: 21 mg via TRANSDERMAL
  Filled 2012-02-10: qty 1

## 2012-02-10 MED ORDER — ONDANSETRON HCL 4 MG PO TABS: 4.0000 mg | ORAL_TABLET | Freq: Three times a day (TID) | ORAL | Status: DC | PRN

## 2012-02-10 MED ORDER — HYDROCHLOROTHIAZIDE 12.5 MG PO CAPS
12.5000 mg | ORAL_CAPSULE | Freq: Every morning | ORAL | Status: DC
  Filled 2012-02-10 (×3): qty 1

## 2012-02-10 MED ORDER — HYDROCODONE-ACETAMINOPHEN 5-325 MG PO TABS
2.0000 | ORAL_TABLET | Freq: Once | ORAL | Status: AC
  Administered 2012-02-10: 2 via ORAL
  Filled 2012-02-10: qty 2

## 2012-02-10 MED ORDER — ACETAMINOPHEN 325 MG PO TABS: 650.0000 mg | ORAL_TABLET | ORAL | Status: DC | PRN

## 2012-02-10 NOTE — ED Provider Notes (Signed)
History     CSN: [id number]  Arrival date & time 02/10/12  1735   First MD Initiated Contact with Patient 02/10/12 1830      Chief Complaint  Patient presents with  . Medical Clearance    (Consider location/radiation/quality/duration/timing/severity/associated sxs/prior treatment) HPI Comments: Patient brought in police custody after threatening suicide with a gun. Amer removed from patient and called police. Patient states he felt depressed and suicidal for about the past week. History of depression but is not any medications. Denies homicidal ideation or hallucinations. Recent admission for left-sided weakness and negative stroke workup. Patient denies any alcohol or illicit drug use  The history is provided by the patient and the police.    Past Medical History  Diagnosis Date  . Hypertension   . Arthritis   . Fibromyalgia     History reviewed. No pertinent past surgical history.  History reviewed. No pertinent family history.  History  Substance Use Topics  . Smoking status: Current Everyday Smoker  . Smokeless tobacco: Not on file  . Alcohol Use: No      Review of Systems  Constitutional: Negative for fever, activity change and appetite change.  HENT: Negative for congestion and rhinorrhea.   Respiratory: Negative for cough, chest tightness and shortness of breath.   Cardiovascular: Negative for chest pain.  Gastrointestinal: Negative for nausea, vomiting and abdominal pain.  Genitourinary: Negative for dysuria.  Musculoskeletal: Negative for back pain.  Skin: Negative for rash.  Neurological: Negative for dizziness and headaches.  Psychiatric/Behavioral: Positive for suicidal ideas, behavioral problems, self-injury and decreased concentration. The patient is nervous/anxious.     Allergies  Gabapentin  Home Medications   Current Outpatient Rx  Name Route Sig Dispense Refill  . ALPRAZOLAM 0.25 MG PO TABS Oral Take 1 tablet (0.25 mg total) by mouth 2  (two) times daily as needed for anxiety. 10 tablet 0  . ASPIRIN EC 81 MG PO TBEC Oral Take 81 mg by mouth every morning.    Marland Kitchen HYDROCHLOROTHIAZIDE 12.5 MG PO CAPS Oral Take 12.5 mg by mouth every morning.    Marland Kitchen HYDROCODONE-ACETAMINOPHEN 10-325 MG PO TABS Oral Take 1 tablet by mouth every 6 (six) hours as needed.      BP 139/93  Pulse 77  Temp 98.5 F (36.9 C) (Oral)  Resp 18  Ht 5\' 9"  (1.753 m)  Wt 200 lb (90.719 kg)  BMI 29.53 kg/m2  SpO2 97%  Physical Exam  Constitutional: He is oriented to person, place, and time. He appears well-developed and well-nourished. No distress.       Flat affect  HENT:  Head: Normocephalic and atraumatic.  Mouth/Throat: Oropharynx is clear and moist. No oropharyngeal exudate.  Eyes: Conjunctivae and EOM are normal. Pupils are equal, round, and reactive to light.  Neck: Normal range of motion. Neck supple.  Cardiovascular: Normal rate, regular rhythm and normal heart sounds.   No murmur heard. Pulmonary/Chest: Effort normal and breath sounds normal. No respiratory distress.  Abdominal: Soft. There is no tenderness. There is no rebound and no guarding.  Musculoskeletal: Normal range of motion. He exhibits no edema and no tenderness.  Neurological: He is alert and oriented to person, place, and time. No cranial nerve deficit.  Skin: Skin is warm.    ED Course  Procedures (including critical care time)   Labs Reviewed  CBC  COMPREHENSIVE METABOLIC PANEL  ETHANOL  URINE RAPID DRUG SCREEN (HOSP PERFORMED)   No results found.   1. Suicidal ideation  MDM  Suicidal ideation with plan to shoot himself with gun. Vital stable, no distress.  Screening labs, discuss with act team  Labs reviewed unremarkable. Medically clear for psychiatric evaluation.      Glynn Octave, MD 02/10/12 [phone]

## 2012-02-10 NOTE — ED Notes (Signed)
Suicidal  At home, brought in by police in cuffs, Father removed a gun from pt.

## 2012-02-10 NOTE — BH Assessment (Addendum)
Assessment Note   Mathew Brady is an 46 y.o. male  PT PRESENTED TO THE ER BY [facility] TO BE EVALUATED DUE TO SUICIDE ATTEMPT AT HOME PT IS NOT UNDER IVC.  PT REPORTS HE HAS BEEN DEPRESSED FOR MONTHS AND HAS WORSEN OVER THE PAST FEW WEEKS WITH INCREASED ANXIETY AND A HEADACHE FOR A WEEK BROUGHT ON BY STRESS. 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 HE REPORTS NOT BEING  ABLE TO CARE FOR HIMSELF AND SUFFERS FROM CHRONIC PAIN. HE REPORTS HE ONLY WENT TO THE 7TH  GRADE AND STILL CANNOT READ AND WRITE. HE REPORTS RACING THOUGHTS AND JUST WANTING TO DIE. TODAY HE GOT HIS GUN AND DECIDED TO KILL HIMSELF AND WAS STOPPED BY HIS WIFE AND PARENTS.  PT CONTINUES TO REMAIN SUICIDAL AND IS UNABLE TO CONTRACT FOR SAFETY. HE DENIES H/I AND IS NOT PSYCHOTIC. HE REPORTS NO DRUG AND ALCOHOL USE. HIS PCP PRESCRIBED 0.5MG  XANAX BID AND PT REPORTS BEING MED COMPLIANT. PT WIFE AND DAD AT BEDSIDE AND VERY SUPPORTIVE.   Marland Kitchen   Axis I: Depressive Disorder NOS Axis II: Deferred Axis III:  Past Medical History  Diagnosis Date  . Hypertension   . Arthritis   . Fibromyalgia    Axis IV: economic problems, housing problems, occupational problems and problems with primary support group Axis V: 11-20 some danger of hurting self or others possible OR occasionally fails to maintain minimal personal hygiene OR gross impairment in communication  Past Medical History:  Past Medical History  Diagnosis Date  . Hypertension   . Arthritis   . Fibromyalgia     History reviewed. No pertinent past surgical history.  Family History: History reviewed. No pertinent family history.  Social History:  reports that he has been smoking.  He does not have any smokeless tobacco history on file. He reports that he does not drink alcohol or use illicit drugs.  Additional Social History:  Alcohol / Drug Use Pain Medications:  no Prescriptions: no Over the Counter: no History of alcohol / drug use?: No history of alcohol / drug abuse  CIWA: CIWA-Ar BP: 139/93 mmHg Pulse Rate: 77  COWS:    Allergies:  Allergies  Allergen Reactions  . Gabapentin Other (See Comments)    Thrush in mouth    Home Medications:  (Not in a hospital admission)  OB/GYN Status:  No LMP for male patient.  General Assessment Data Location of Assessment: [facility] ACT Assessment: Yes Living Arrangements: Spouse/significant other Can pt return to current living arrangement?: Yes Admission Status: Voluntary Is patient capable of signing voluntary admission?: Yes Transfer from: Acute Hospital ([place] er) Referral Source: MD (DR Manus Gunning)  Education Status Contact person: KOLTON KIENLE [phone]  Risk to self Suicidal Ideation: Yes-Currently Present Suicidal Intent: Yes-Currently Present Is patient at risk for suicide?: Yes Suicidal Plan?: Yes-Currently Present Specify Current Suicidal Plan: TO SHOOT SELF AND DID ATTEMPT TONIGHT Access to Means: Yes Specify Access to Suicidal Means: HAD OWN GUN COCKED TO SHOOT SELF TODAY (FAMILY STOPPED HIM) What has been your use of drugs/alcohol within the last 12 months?: NONE Previous Attempts/Gestures: Yes How many times?: 1  (2 WEEKS AGO THREATEN TO SHOOT SELF-UNREPORTED) Other Self Harm Risks: NA Triggers for Past Attempts: Other personal contacts (FINANCIAL, HAVING TO MOVE) Intentional Self Injurious Behavior: None Family Suicide History: No Recent stressful life event(s): Financial Problems;Recent  negative physical changes (HAD TO MOVE) Persecutory voices/beliefs?: No Depression: Yes Depression Symptoms: Despondent;Insomnia;Tearfulness;Isolating;Fatigue;Guilt;Loss of interest in usual pleasures;Feeling worthless/self pity;Feeling angry/irritable Substance abuse history and/or treatment for substance abuse?: No Suicide prevention information given to non-admitted patients:  Not applicable  Risk to Others Homicidal Ideation: No Thoughts of Harm to Others: No Current Homicidal Intent: No Current Homicidal Plan: No Access to Homicidal Means: No (DAD TOOK HIS GUN ) History of harm to others?: No Assessment of Violence: None Noted Does patient have access to weapons?: No (DAD TOOK HIS GUN) Criminal Charges Pending?: No Does patient have a court date: No  Psychosis Hallucinations: None noted Delusions: None noted  Mental Status Report Appear/Hygiene: Improved Eye Contact: Good Motor Activity: Freedom of movement Speech: Logical/coherent Level of Consciousness: Alert Mood: Depressed;Despair;Empty;Fearful;Guilty;Helpless;Irritable;Sad;Worthless, low self-esteem Affect: Depressed;Fearful;Frightened;Irritable;Appropriate to circumstance;Sad Anxiety Level: Minimal Thought Processes: Coherent;Relevant (DESCRIBES RACING THOUGHTS) Judgement: Impaired Orientation: Person;Place;Time;Situation Obsessive Compulsive Thoughts/Behaviors: Moderate  Cognitive Functioning Concentration: Normal Memory: Recent Intact;Remote Intact IQ: Average Insight: Poor Impulse Control: Poor Appetite: Fair Sleep: Decreased Total Hours of Sleep: 2  Vegetative Symptoms: None  ADLScreening [facility]) Patient's cognitive ability adequate to safely complete daily activities?: Yes Patient able to express need for assistance with ADLs?: Yes Independently performs ADLs?: Yes  Abuse/Neglect [facility]) Physical Abuse: Denies Verbal Abuse: Denies Sexual Abuse: Denies  Prior Inpatient Therapy Prior Inpatient Therapy: Yes Prior Therapy Dates: 2005 Prior Therapy Facilty/Provider(s): [facility] Reason for Treatment: NERVOUS BREAKDOWN  Prior Outpatient Therapy Prior Outpatient Therapy: Yes Prior Therapy Dates: NA Prior Therapy Facilty/Provider(s): NA Reason for Treatment: NA  ADL Screening (condition at time of admission) Patient's cognitive ability adequate to safely  complete daily activities?: Yes Patient able to express need for assistance with ADLs?: Yes Independently performs ADLs?: Yes Weakness of Legs: None  Home Assistive Devices/Equipment Home Assistive Devices/Equipment: None  Therapy Consults (therapy consults require a physician order) PT Evaluation Needed: No OT Evalulation Needed: No SLP Evaluation Needed: No Abuse/Neglect Assessment (Assessment to be complete while patient is alone) Physical Abuse: Denies Verbal Abuse: Denies Sexual Abuse: Denies Exploitation of patient/patient's resources: Denies Self-Neglect: Denies Values / Beliefs Cultural Requests During Hospitalization: None Spiritual Requests During Hospitalization: None Consults Spiritual Care Consult Needed: No Social Work Consult Needed: No Merchant navy officer (For Healthcare) Advance Directive: Patient does not have advance directive;Patient would not like information Pre-existing out of facility DNR order (yellow form or pink MOST form): No    Additional Information 1:1 In Past 12 Months?: No CIRT Risk: No Elopement Risk: No Does patient have medical clearance?: Yes     Disposition: REFERRED TO [facility]. Patient has been accepted to [facility] by Dr. Ferol Luz to the service of Dr. Dan Humphreys. He is involuntary and will be transported by [facility].  Dr Bernette Mayers is in agreement with this disposition. Disposition Disposition of Patient: Inpatient treatment program Type of inpatient treatment program: Adult (REFERRED TO [facility])  On Site Evaluation by:DR RANCOUR   Reviewed with Physician:        Hattie Perch Winford 02/10/2012 10:18 PM

## 2012-02-11 ENCOUNTER — Inpatient Hospital Stay (HOSPITAL_COMMUNITY)
Admission: AD | Admit: 2012-02-11 | Discharge: 2012-02-14 | DRG: 897 | Disposition: A | Discharge status: Home / Self Care | Payer: 59 | Attending: Psychiatry | Admitting: Psychiatry

## 2012-02-11 ENCOUNTER — Encounter (HOSPITAL_COMMUNITY): Payer: Self-pay | Admitting: *Deleted

## 2012-02-11 DIAGNOSIS — F1920 Other psychoactive substance dependence, uncomplicated: Secondary | ICD-10-CM | POA: Diagnosis present

## 2012-02-11 DIAGNOSIS — K029 Dental caries, unspecified: Secondary | ICD-10-CM

## 2012-02-11 DIAGNOSIS — Z79899 Other long term (current) drug therapy: Secondary | ICD-10-CM

## 2012-02-11 DIAGNOSIS — F411 Generalized anxiety disorder: Secondary | ICD-10-CM | POA: Diagnosis present

## 2012-02-11 DIAGNOSIS — F17200 Nicotine dependence, unspecified, uncomplicated: Secondary | ICD-10-CM | POA: Diagnosis present

## 2012-02-11 DIAGNOSIS — K120 Recurrent oral aphthae: Secondary | ICD-10-CM

## 2012-02-11 DIAGNOSIS — G479 Sleep disorder, unspecified: Secondary | ICD-10-CM | POA: Diagnosis present

## 2012-02-11 DIAGNOSIS — I10 Essential (primary) hypertension: Secondary | ICD-10-CM | POA: Diagnosis present

## 2012-02-11 DIAGNOSIS — M797 Fibromyalgia: Secondary | ICD-10-CM | POA: Diagnosis present

## 2012-02-11 DIAGNOSIS — E876 Hypokalemia: Secondary | ICD-10-CM

## 2012-02-11 DIAGNOSIS — G43909 Migraine, unspecified, not intractable, without status migrainosus: Secondary | ICD-10-CM | POA: Diagnosis present

## 2012-02-11 DIAGNOSIS — IMO0001 Reserved for inherently not codable concepts without codable children: Secondary | ICD-10-CM | POA: Diagnosis present

## 2012-02-11 DIAGNOSIS — F1120 Opioid dependence, uncomplicated: Secondary | ICD-10-CM | POA: Diagnosis present

## 2012-02-11 DIAGNOSIS — R531 Weakness: Secondary | ICD-10-CM

## 2012-02-11 DIAGNOSIS — F1994 Other psychoactive substance use, unspecified with psychoactive substance-induced mood disorder: Principal | ICD-10-CM | POA: Diagnosis present

## 2012-02-11 DIAGNOSIS — F419 Anxiety disorder, unspecified: Secondary | ICD-10-CM | POA: Diagnosis present

## 2012-02-11 DIAGNOSIS — M129 Arthropathy, unspecified: Secondary | ICD-10-CM | POA: Diagnosis present

## 2012-02-11 DIAGNOSIS — F1320 Sedative, hypnotic or anxiolytic dependence, uncomplicated: Secondary | ICD-10-CM | POA: Diagnosis present

## 2012-02-11 MED ORDER — HYDROCHLOROTHIAZIDE 12.5 MG PO CAPS
12.5000 mg | ORAL_CAPSULE | Freq: Every day | ORAL | Status: DC
  Administered 2012-02-11 – 2012-02-14 (×4): 12.5 mg via ORAL
  Filled 2012-02-11 (×3): qty 1
  Filled 2012-02-11: qty 14
  Filled 2012-02-11 (×2): qty 1

## 2012-02-11 MED ORDER — HYDROCODONE-ACETAMINOPHEN 10-325 MG PO TABS: 1.0000 | ORAL_TABLET | Freq: Four times a day (QID) | ORAL | Status: DC | PRN

## 2012-02-11 MED ORDER — MAGNESIUM HYDROXIDE 400 MG/5ML PO SUSP: 30.0000 mL | Freq: Every day | ORAL | Status: DC | PRN

## 2012-02-11 MED ORDER — ACETAMINOPHEN 325 MG PO TABS
650.0000 mg | ORAL_TABLET | Freq: Four times a day (QID) | ORAL | Status: DC | PRN
  Administered 2012-02-12: 650 mg via ORAL

## 2012-02-11 MED ORDER — MIRTAZAPINE 30 MG PO TBDP
30.0000 mg | ORAL_TABLET | Freq: Every day | ORAL | Status: DC
  Administered 2012-02-11: 30 mg via ORAL
  Filled 2012-02-11 (×4): qty 1

## 2012-02-11 MED ORDER — LORAZEPAM 1 MG PO TABS
1.0000 mg | ORAL_TABLET | Freq: Four times a day (QID) | ORAL | Status: DC | PRN
  Administered 2012-02-11 (×2): 1 mg via ORAL
  Filled 2012-02-11 (×2): qty 1

## 2012-02-11 MED ORDER — NICOTINE 21 MG/24HR TD PT24
21.0000 mg | MEDICATED_PATCH | Freq: Every day | TRANSDERMAL | Status: DC
  Administered 2012-02-12 – 2012-02-13 (×2): 21 mg via TRANSDERMAL
  Filled 2012-02-11 (×6): qty 1

## 2012-02-11 MED ORDER — HYDROCODONE-ACETAMINOPHEN 10-325 MG PO TABS
1.0000 | ORAL_TABLET | Freq: Four times a day (QID) | ORAL | Status: DC | PRN
  Administered 2012-02-11: 1 via ORAL
  Filled 2012-02-11: qty 1

## 2012-02-11 MED ORDER — HYDROCODONE-ACETAMINOPHEN 5-325 MG PO TABS
1.0000 | ORAL_TABLET | Freq: Four times a day (QID) | ORAL | Status: DC | PRN
  Administered 2012-02-11 – 2012-02-12 (×3): 1 via ORAL
  Filled 2012-02-11 (×3): qty 1

## 2012-02-11 MED ORDER — ESCITALOPRAM OXALATE 20 MG PO TABS
20.0000 mg | ORAL_TABLET | Freq: Every day | ORAL | Status: DC
Start: 2012-02-11 — End: 2012-02-11
  Administered 2012-02-11: 20 mg via ORAL
  Filled 2012-02-11 (×3): qty 1

## 2012-02-11 MED ORDER — ZOLPIDEM TARTRATE 5 MG PO TABS
5.0000 mg | ORAL_TABLET | Freq: Every day | ORAL | Status: DC
  Administered 2012-02-11: 5 mg via ORAL
  Filled 2012-02-11: qty 1

## 2012-02-11 MED ORDER — HYDROCODONE-ACETAMINOPHEN 5-325 MG PO TABS
2.0000 | ORAL_TABLET | Freq: Four times a day (QID) | ORAL | Status: DC | PRN
  Administered 2012-02-11 (×2): 2 via ORAL
  Filled 2012-02-11 (×2): qty 2

## 2012-02-11 MED ORDER — CARBAMAZEPINE ER 200 MG PO TB12
200.0000 mg | ORAL_TABLET | Freq: Every day | ORAL | Status: DC
  Administered 2012-02-11: 200 mg via ORAL
  Filled 2012-02-11 (×4): qty 1

## 2012-02-11 MED ORDER — ALUM & MAG HYDROXIDE-SIMETH 200-200-20 MG/5ML PO SUSP: 30.0000 mL | ORAL | Status: DC | PRN

## 2012-02-11 MED ORDER — CLONAZEPAM 0.5 MG PO TABS
0.5000 mg | ORAL_TABLET | Freq: Two times a day (BID) | ORAL | Status: DC | PRN
  Administered 2012-02-11 – 2012-02-12 (×2): 0.5 mg via ORAL
  Filled 2012-02-11 (×2): qty 1

## 2012-02-11 MED ORDER — ASPIRIN EC 81 MG PO TBEC
81.0000 mg | DELAYED_RELEASE_TABLET | Freq: Every day | ORAL | Status: DC
  Administered 2012-02-11 – 2012-02-14 (×4): 81 mg via ORAL
  Filled 2012-02-11 (×3): qty 1
  Filled 2012-02-11: qty 14
  Filled 2012-02-11 (×2): qty 1

## 2012-02-11 NOTE — ED Notes (Signed)
Change of shift, care except from prior physician , The patient presented with depression and had a suicidal gesture of holding a gun to his head prior to arrival. I discussed his care with his wife and with the patient, he is aware that he is under involuntary commitment, provide anxiety relief and pain relief as he does have chronic anxiety and pain problems. Currently he is willing to cooperate with the plan, he has been seen by behavioral health, place pending. No other needs identified at this time.  Vida Roller, MD 02/11/12 [phone]

## 2012-02-11 NOTE — Progress Notes (Signed)
Admission Note: 46 yr old male admitted for SI and depression.  Patient was brought in by [facility]. [facility]. He is an involuntary admission.  Yesterday he held a gun to head in an apparent suicide attempt.  He was prevented from shooting himself by his parent and his wife.  Patient has been anxious and depressed for some time.  He has been the primary caregiver for his wife for the past 7 years due to her disability.  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  He has a 7th grade education.  He has chronic pain; suffers from arthritis and fibromyalgia.  He also has HTN.  He denies any substance abuse.  He denies any psyhe hospitalizations in the past.  His UDS was positive for benzo and opiods; he does have scripts for both.  His stressors are caring for his disabled wife, financial problems, depression and chronic pain.  He has SI, but contracts for safety at this time.  He denies any HI or AVH.  He admits to having H/A for the past week due to stress.  He is alert and oriented; ambulates well.  He is cooperative and interacts well.  His behavior is appropriate.  He was oriented to his room and the unit.

## 2012-02-11 NOTE — Progress Notes (Signed)
Psychoeducational Group Note  Date:  02/11/2012 Time:  2002  Group Topic/Focus:  Wrap-Up Group:   The focus of this group is to help patients review their daily goal of treatment and discuss progress on daily workbooks.  Participation Level:  Did Not Attend  Participation Quality:  N/A Did not attend wrap-up group  Affect:    Cognitive:    Insight:    Engagement in Group:    Additional Comments:  Patient did not attend wrap-up group this evening. Patient was awake, in bed, for the duration of wrap-up group time.  Thea Holshouser, Newton Pigg 02/11/2012, 9:27 PM

## 2012-02-11 NOTE — ED Notes (Signed)
Pt given breakfast tray and coke.

## 2012-02-11 NOTE — H&P (Signed)
Medical/psychiatric screening examination/treatment/procedure(s) were performed by non-physician practitioner and as supervising physician I was immediately available for consultation/collaboration.  I have seen and examined this patient and agree the major elements of this evaluation.  

## 2012-02-11 NOTE — Tx Team (Signed)
Initial Interdisciplinary Treatment Plan  PATIENT STRENGTHS: (choose at least two) Ability for insight Average or above average intelligence Capable of independent living Communication skills General fund of knowledge Physical Health Supportive family/friends Work skills  PATIENT STRESSORS: Educational concerns Financial difficulties Occupational concerns   PROBLEM LIST: Problem List/Patient Goals Date to be addressed Date deferred Reason deferred Estimated date of resolution  Suicidal Ideation with Plan 02-11-2012   Discharge  Depression 02-11-2012   Discharge  Respite Care for spouse 02-11-2012   Discharge                                       DISCHARGE CRITERIA:  Ability to meet basic life and health needs Improved stabilization in mood, thinking, and/or behavior Motivation to continue treatment in a less acute level of care Need for constant or close observation no longer present Reduction of life-threatening or endangering symptoms to within safe limits Verbal commitment to aftercare and medication compliance  PRELIMINARY DISCHARGE PLAN: Attend aftercare/continuing care group Outpatient therapy Return to previous living arrangement Return to previous work or school arrangements  PATIENT/FAMIILY INVOLVEMENT: This treatment plan has been presented to and reviewed with the patient, Mathew Brady.  The patient and family have been given the opportunity to ask questions and make suggestions.  Cranford Mon 02/11/2012, 6:36 PM

## 2012-02-11 NOTE — ED Provider Notes (Signed)
Pt accepted at [facility] by Dr. Dan Humphreys.   Hadrian Yarbrough B. Bernette Mayers, MD 02/11/12 [phone]

## 2012-02-11 NOTE — H&P (Signed)
Psychiatric Admission Assessment Adult  Patient Identification:  Mathew Brady Date of Evaluation:  02/11/2012 [id number] CC: Threatened to shoot himself with a gun. IVC from [facility]  History of Present Illness: Mother and wife took gun away. He and wife have just moved into an apartment. Only income is wife's disability $878/month. 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 He is prescribed Xanax and Hydrocodone for nerves fibromyalgia and has chronic migraines. Had a "nervous breakdown " 2008 and was admitted here but did not follow through post discharge with psychiatric care.  Today he is worried about how to pay all the bills and get he and wife's meds. Wants his nerves straightened out and to sleep al;so to get rid of chronic headache.   Substance Abuse History: NO history for abuse but is dependant   Social History:    reports that he has been smoking.  He does not have any smokeless tobacco history on file. He reports that he does not drink alcohol or use illicit drugs. Went to 7th grade. This is second wife married 21 years no children.  Family Psych History:  Past Medical History:     Past Medical History  Diagnosis Date  . Hypertension   . Arthritis   . Fibromyalgia   Migraines  Recently admitted 7/21-7/22/2013 for L sided weakness had a negative stroke workup. He had run out of his opiates and benzoes and had bought off the street. Gets scripts at [facility] [phone]. Had already closed so Rx could be verified.     History reviewed. No pertinent past surgical history.  Allergies:  Allergies  Allergen Reactions  . Gabapentin Other (See Comments)    Thrush in mouth    Current Medications:  Prior to Admission medications   Medication Sig Start Date End Date Taking? Authorizing Provider  ALPRAZolam (XANAX) 0.25 MG tablet Take 1 tablet (0.25 mg total) by mouth 2 (two) times daily as needed for anxiety. 01/27/12  Yes  Christiane Ha, MD  aspirin EC 81 MG tablet Take 81 mg by mouth every morning.   Yes Historical Provider, MD  hydrochlorothiazide (MICROZIDE) 12.5 MG capsule Take 12.5 mg by mouth every morning.   Yes Historical Provider, MD  HYDROcodone-acetaminophen (NORCO) 10-325 MG per tablet Take 1 tablet by mouth every 6 (six) hours as needed.   Yes Historical Provider, MD    Mental Status Examination/Evaluation: Objective:  Appearance: Fairly Groomed  Psychomotor Activity:  Normal  Eye Contact::  Good  Speech:  Clear and Coherent and Normal Rate  Volume:  Normal  Mood:  Anxiously depressed   Affect:  Congruent  Thought Process:  clear rational goal oriented - get meds continued somehow   Orientation:  Full  Thought Content:  No AVH/psychosis   Suicidal Thoughts:  No says those thoughts have stopped   Homicidal Thoughts:  No  Judgement:  Impaired  Insight:  Shallow    DIAGNOSIS:    AXIS I Depressive Disorder NOS and Rule out Substance dependence  AXIS II Deferred  AXIS III See medical history.  AXIS IV economic problems, educational problems, housing problems, occupational problems, other psychosocial or environmental problems and problems with access to health care services  AXIS V 11-20 some danger of hurting self or others possible OR occasionally fails to maintain minimal personal hygiene OR gross impairment in communication     Treatment Plan Summary: Admit for safety & stabilization  Check  with his pharmacy re: Xanax and hydrocodone Start Remeron for sleep dep/anxiety           Tegretol to help with migraine prophylaxis and mood stabilization  Check thyroid  Agree with eval from [facility] ED and admission 7/21-7/22.

## 2012-02-11 NOTE — [facility] Counselor (Signed)
Mathew Brady has been accepted to [facility] by Dr Ferol Luz to there service of  Dr. Dan Humphreys. He is involuntary and will be transported by [facility]. He is a [employer], we spoke with [employer]. At 9:15. She authorized 3 days starting today, with review to be 02/13/12. Auth # is [id number]. Support paperwork completed and faxed to [facility]. He is assigned to room #501-02.

## 2012-02-12 DIAGNOSIS — F1120 Opioid dependence, uncomplicated: Secondary | ICD-10-CM

## 2012-02-12 DIAGNOSIS — F1920 Other psychoactive substance dependence, uncomplicated: Secondary | ICD-10-CM | POA: Diagnosis present

## 2012-02-12 DIAGNOSIS — F411 Generalized anxiety disorder: Secondary | ICD-10-CM

## 2012-02-12 DIAGNOSIS — F17200 Nicotine dependence, unspecified, uncomplicated: Secondary | ICD-10-CM

## 2012-02-12 DIAGNOSIS — F1994 Other psychoactive substance use, unspecified with psychoactive substance-induced mood disorder: Principal | ICD-10-CM

## 2012-02-12 DIAGNOSIS — F1320 Sedative, hypnotic or anxiolytic dependence, uncomplicated: Secondary | ICD-10-CM

## 2012-02-12 MED ORDER — MELOXICAM 7.5 MG PO TABS
7.5000 mg | ORAL_TABLET | Freq: Every day | ORAL | Status: DC
  Administered 2012-02-12 – 2012-02-14 (×3): 7.5 mg via ORAL
  Filled 2012-02-12: qty 14
  Filled 2012-02-12 (×5): qty 1

## 2012-02-12 MED ORDER — LOPERAMIDE HCL 2 MG PO CAPS: 2.0000 mg | ORAL_CAPSULE | ORAL | Status: DC | PRN

## 2012-02-12 MED ORDER — ONDANSETRON 4 MG PO TBDP
4.0000 mg | ORAL_TABLET | Freq: Four times a day (QID) | ORAL | Status: DC | PRN
  Administered 2012-02-13: 4 mg via ORAL

## 2012-02-12 MED ORDER — AMITRIPTYLINE HCL 25 MG PO TABS
25.0000 mg | ORAL_TABLET | Freq: Every day | ORAL | Status: DC
  Administered 2012-02-12 – 2012-02-13 (×2): 25 mg via ORAL
  Filled 2012-02-12 (×3): qty 1
  Filled 2012-02-12: qty 14
  Filled 2012-02-12: qty 1

## 2012-02-12 MED ORDER — MIRTAZAPINE 7.5 MG PO TABS
7.5000 mg | ORAL_TABLET | Freq: Every day | ORAL | Status: DC
  Filled 2012-02-12 (×2): qty 1

## 2012-02-12 MED ORDER — DICYCLOMINE HCL 20 MG PO TABS: 20.0000 mg | ORAL_TABLET | Freq: Four times a day (QID) | ORAL | Status: DC | PRN

## 2012-02-12 MED ORDER — HYDROXYZINE HCL 25 MG PO TABS
25.0000 mg | ORAL_TABLET | Freq: Four times a day (QID) | ORAL | Status: DC | PRN
  Administered 2012-02-13: 25 mg via ORAL
  Filled 2012-02-12: qty 1

## 2012-02-12 MED ORDER — IBUPROFEN 800 MG PO TABS: 800.0000 mg | ORAL_TABLET | Freq: Four times a day (QID) | ORAL | Status: DC | PRN

## 2012-02-12 MED ORDER — AMITRIPTYLINE HCL 25 MG PO TABS
25.0000 mg | ORAL_TABLET | ORAL | Status: AC
  Administered 2012-02-12: 25 mg via ORAL

## 2012-02-12 MED ORDER — METHOCARBAMOL 500 MG PO TABS
500.0000 mg | ORAL_TABLET | Freq: Three times a day (TID) | ORAL | Status: DC | PRN
  Administered 2012-02-12 – 2012-02-14 (×2): 500 mg via ORAL
  Filled 2012-02-12 (×2): qty 1

## 2012-02-12 MED ORDER — MIRTAZAPINE 7.5 MG PO TABS
7.5000 mg | ORAL_TABLET | Freq: Every day | ORAL | Status: DC
  Filled 2012-02-12: qty 1

## 2012-02-12 MED ORDER — MIRTAZAPINE 15 MG PO TABS
7.5000 mg | ORAL_TABLET | Freq: Every day | ORAL | Status: AC
  Administered 2012-02-12: 7.5 mg via ORAL
  Filled 2012-02-12: qty 0.5
  Filled 2012-02-12: qty 1

## 2012-02-12 MED ORDER — NAPROXEN 500 MG PO TABS: 500.0000 mg | ORAL_TABLET | Freq: Two times a day (BID) | ORAL | Status: DC | PRN

## 2012-02-12 MED ORDER — PANTOPRAZOLE SODIUM 20 MG PO TBEC
20.0000 mg | DELAYED_RELEASE_TABLET | Freq: Two times a day (BID) | ORAL | Status: DC
  Administered 2012-02-13 – 2012-02-14 (×3): 20 mg via ORAL
  Filled 2012-02-12 (×3): qty 1
  Filled 2012-02-12: qty 28
  Filled 2012-02-12 (×2): qty 1
  Filled 2012-02-12: qty 28

## 2012-02-12 MED ORDER — CARBAMAZEPINE 200 MG PO TABS
200.0000 mg | ORAL_TABLET | Freq: Three times a day (TID) | ORAL | Status: DC
  Administered 2012-02-12 – 2012-02-14 (×5): 200 mg via ORAL
  Filled 2012-02-12: qty 42
  Filled 2012-02-12 (×7): qty 1
  Filled 2012-02-12 (×2): qty 42
  Filled 2012-02-12 (×2): qty 1

## 2012-02-12 MED ORDER — AMITRIPTYLINE HCL 10 MG PO TABS
10.0000 mg | ORAL_TABLET | ORAL | Status: AC
  Administered 2012-02-12: 10 mg via ORAL
  Filled 2012-02-12 (×2): qty 1

## 2012-02-12 MED ORDER — CHLORPROMAZINE HCL 25 MG PO TABS
25.0000 mg | ORAL_TABLET | Freq: Three times a day (TID) | ORAL | Status: DC | PRN
  Administered 2012-02-12: 25 mg via ORAL
  Filled 2012-02-12: qty 1

## 2012-02-12 MED ORDER — GRX ANALGESIC BALM EX OINT
1.0000 "application " | TOPICAL_OINTMENT | CUTANEOUS | Status: DC | PRN
  Filled 2012-02-12: qty 28

## 2012-02-12 MED ORDER — CLONIDINE HCL 0.2 MG/24HR TD PTWK
0.2000 mg | MEDICATED_PATCH | TRANSDERMAL | Status: DC
  Administered 2012-02-12: 0.2 mg via TRANSDERMAL
  Filled 2012-02-12 (×2): qty 1

## 2012-02-12 MED ORDER — CHLORPROMAZINE HCL 25 MG PO TABS: 25.0000 mg | ORAL_TABLET | Freq: Three times a day (TID) | ORAL | Status: DC

## 2012-02-12 NOTE — [facility] Suicide Risk Assessment (Signed)
Suicide Risk Assessment  Admission Assessment     Demographic factors:  Assessment Details Time of Assessment: Admission Information Obtained From: Patient Current Mental Status:  Current Mental Status: Suicidal ideation indicated by patient;Suicide plan;Self-harm thoughts Loss Factors:  Loss Factors: Financial problems / change in socioeconomic status Historical Factors:  Historical Factors: Impulsivity Risk Reduction Factors:  Risk Reduction Factors: Sense of responsibility to family;Living with another person, especially a relative;Positive social support;Positive therapeutic relationship  CLINICAL FACTORS:   Severe Anxiety and/or Agitation Depression:   Anhedonia Comorbid alcohol abuse/dependence Insomnia Alcohol/Substance Abuse/Dependencies Chronic Pain Previous Psychiatric Diagnoses and Treatments  COGNITIVE FEATURES THAT CONTRIBUTE TO RISK:  Thought constriction (tunnel vision)    SUICIDE RISK:   Moderate:  Frequent suicidal ideation with limited intensity, and duration, some specificity in terms of plans, no associated intent, good self-control, limited dysphoria/symptomatology, some risk factors present, and identifiable protective factors, including available and accessible social support.  Reason for hospitalization: .Suicidal thoughts with addictions and chronic pain. Diagnosis:   Axis I: Anxiety Disorder NOS, Substance Induced Mood Disorder and Polysubstance Dependence Opiates, Benzodiazepines, and Nicotine Axis II: Deferred Axis III:  Past Medical History  Diagnosis Date  . Hypertension   . Arthritis   . Fibromyalgia    Axis IV: other psychosocial or environmental problems Axis V: 21-30 behavior considerably influenced by delusions or hallucinations OR serious impairment in judgment, communication OR inability to function in almost all areas  ADL's:  Intact  Sleep: Poor  Appetite:  Fair  Suicidal Ideation:  Plan:  No Intent:  No Means:  no Homicidal  Ideation:  Plan:  No Intent:  No Means:  No  AEB (as evidenced by):  Mental Status Examination/Evaluation: Objective:  Appearance: Casual  Eye Contact::  Good  Speech:  Clear and Coherent  Volume:  Normal  Mood:  Anxious, Depressed, Hopeless, Irritable and Worthless  Affect:  Congruent  Thought Process:  Coherent  Orientation:  Full  Thought Content:  WDL  Suicidal Thoughts:  Yes.  without intent/plan  Homicidal Thoughts:  No  Memory:  Immediate;   Fair Recent;   Fair Remote;   Fair  Judgement:  Impaired  Insight:  Lacking  Psychomotor Activity:  Normal  Concentration:  Fair  Recall:  Fair  Akathisia:  No  Handed:  Right  AIMS (if indicated):     Assets:  Communication Skills Desire for Improvement  Sleep:  Number of Hours: 4    Vital Signs:Blood pressure 138/91, pulse 73, temperature 98.5 F (36.9 C), temperature source Oral, resp. rate 15, height 5\' 8"  (1.727 m), weight 84.823 kg (187 lb). Current Medications: Current Facility-Administered Medications  Medication Dose Route Frequency Provider Last Rate Last Dose  . acetaminophen (TYLENOL) tablet 650 mg  650 mg Oral Q6H PRN Mickeal Skinner, MD   650 mg at 02/12/12 1332  . alum & mag hydroxide-simeth (MAALOX/MYLANTA) 200-200-20 MG/5ML suspension 30 mL  30 mL Oral Q4H PRN Mickeal Skinner, MD      . amitriptyline (ELAVIL) tablet 10 mg  10 mg Oral NOW Mike Craze, MD   10 mg at 02/12/12 1525  . amitriptyline (ELAVIL) tablet 25 mg  25 mg Oral QHS Mike Craze, MD      . amitriptyline (ELAVIL) tablet 25 mg  25 mg Oral NOW Mike Craze, MD   25 mg at 02/12/12 1736  . aspirin EC tablet 81 mg  81 mg Oral Daily Mickeal Skinner, MD   81 mg at 02/12/12 1610  .  carbamazepine (TEGRETOL) tablet 200 mg  200 mg Oral TID Mike Craze, MD      . chlorproMAZINE (THORAZINE) tablet 25 mg  25 mg Oral TID PRN Mike Craze, MD      . cloNIDine (CATAPRES - Dosed in mg/24 hr) patch 0.2 mg  0.2 mg Transdermal NOW Mike Craze, MD        . dicyclomine (BENTYL) tablet 20 mg  20 mg Oral Q6H PRN Mike Craze, MD      . Oletta Cohn ANALGESIC BALM OINT 1 application  1 application Apply externally PRN Mike Craze, MD      . hydrochlorothiazide (MICROZIDE) capsule 12.5 mg  12.5 mg Oral Daily Mickeal Skinner, MD   12.5 mg at 02/12/12 1610  . hydrOXYzine (ATARAX/VISTARIL) tablet 25 mg  25 mg Oral Q6H PRN Mike Craze, MD      . ibuprofen (ADVIL,MOTRIN) tablet 800 mg  800 mg Oral Q6H PRN Mike Craze, MD      . loperamide (IMODIUM) capsule 2-4 mg  2-4 mg Oral PRN Mike Craze, MD      . magnesium hydroxide (MILK OF MAGNESIA) suspension 30 mL  30 mL Oral Daily PRN Mickeal Skinner, MD      . meloxicam (MOBIC) tablet 7.5 mg  7.5 mg Oral Daily Mike Craze, MD      . methocarbamol (ROBAXIN) tablet 500 mg  500 mg Oral Q8H PRN Mike Craze, MD      . mirtazapine (REMERON) tablet 7.5 mg  7.5 mg Oral QHS Mike Craze, MD      . nicotine (NICODERM CQ - dosed in mg/24 hours) patch 21 mg  21 mg Transdermal Daily Mike Craze, MD   21 mg at 02/12/12 9604  . ondansetron (ZOFRAN-ODT) disintegrating tablet 4 mg  4 mg Oral Q6H PRN Mike Craze, MD      . pantoprazole (PROTONIX) EC tablet 20 mg  20 mg Oral BID AC Mike Craze, MD      . DISCONTD: carbamazepine (TEGRETOL XR) 12 hr tablet 200 mg  200 mg Oral QHS Mickie D. Adams, PA   200 mg at 02/11/12 2200  . DISCONTD: chlorproMAZINE (THORAZINE) tablet 25 mg  25 mg Oral TID Mike Craze, MD      . DISCONTD: clonazePAM Scarlette Calico) tablet 0.5 mg  0.5 mg Oral BID PRN Mickeal Skinner, MD   0.5 mg at 02/12/12 0925  . DISCONTD: HYDROcodone-acetaminophen (NORCO/VICODIN) 5-325 MG per tablet 1 tablet  1 tablet Oral Q6H PRN Mickie D. Adams, PA   1 tablet at 02/12/12 1525  . DISCONTD: mirtazapine (REMERON SOL-TAB) disintegrating tablet 30 mg  30 mg Oral QHS Mickie D. Adams, PA   30 mg at 02/11/12 2200  . DISCONTD: naproxen (NAPROSYN) tablet 500 mg  500 mg Oral BID PRN Mike Craze, MD        Lab  Results:  Results for orders placed during the hospital encounter of 02/11/12 (from the past 48 hour(s))  TSH     Status: Normal   Collection Time   02/12/12  6:15 AM      Component Value Range Comment   TSH 0.357  0.350 - 4.500 uIU/mL     Physical Findings: AIMS: Facial and Oral Movements Muscles of Facial Expression: None, normal Lips and Perioral Area: None, normal Jaw: None, normal Tongue: None, normal,Extremity Movements Upper (arms, wrists, hands, fingers): None, normal Lower (legs, knees, ankles, toes): None, normal,  Trunk Movements Neck, shoulders, hips: None, normal, Overall Severity Severity of abnormal movements (highest score from questions above): None, normal Incapacitation due to abnormal movements: None, normal Patient's awareness of abnormal movements (rate only patient's report): No Awareness, Dental Status Current problems with teeth and/or dentures?: No Does patient usually wear dentures?: No  CIWA:    COWS:     Risk: Risk of harm to self is elevated by his chronic pain, depression, and addictions.  Risk of harm to others is minimal in that he has not been involved in fights or had any legal charges filed on him.  Treatment Plan Summary: Daily contact with patient to assess and evaluate symptoms and progress in treatment Medication management No signs/symptoms of withdrawal from abusable substances. No suicidal or homicidal thoughts for at least 48 hours. Mood/anxiety less than 3/10 where the scale is 1 is the best and 10 is the worst  Plan: Admit, Start Elavil, Mobic, and Analgesic balm for fibromyalgia, stop opiates (use clonidine protocol and patch) and Benzodiazepines and replace with Tegretol TID with PRN Thorazine.  Discussed the risks, benefits, and probable clinical course with and without treatment.  Pt is agreeable to the current course of treatment. We will continue on q. 15 checks the unit protocol. At this time there is no clinical indication for  one-to-one observation as patient contract for safety and presents little risk to harm themself and others.  We will increase collateral information. I encourage patient to participate in group milieu therapy. Pt will be seen in treatment team soon for further treatment and appropriate discharge planning. Please see history and physical note for more detailed information ELOS: 3 to 5 days.   Ric Rosenberg 02/12/2012, 8:04 PM

## 2012-02-12 NOTE — Progress Notes (Signed)
[facility] MD Progress Note  02/12/2012 7:56 PM  Diagnosis:   Axis I: Anxiety Disorder NOS, Substance Induced Mood Disorder and Polysubstance Dependence Opiates, Benzodiazepines, and Nicotine Axis II: Deferred Axis III:  Past Medical History  Diagnosis Date  . Hypertension   . Arthritis   . Fibromyalgia    Axis IV: other psychosocial or environmental problems Axis V: 21-30 behavior considerably influenced by delusions or hallucinations OR serious impairment in judgment, communication OR inability to function in almost all areas  ADL's:  Intact  Sleep: Poor  Appetite:  Fair  Suicidal Ideation:  Plan:  No Intent:  No Means:  no Homicidal Ideation:  Plan:  No Intent:  No Means:  No  AEB (as evidenced by):  Mental Status Examination/Evaluation: Objective:  Appearance: Casual  Eye Contact::  Good  Speech:  Clear and Coherent  Volume:  Normal  Mood:  Anxious, Depressed, Hopeless, Irritable and Worthless  Affect:  Congruent  Thought Process:  Coherent  Orientation:  Full  Thought Content:  WDL  Suicidal Thoughts:  Yes.  without intent/plan  Homicidal Thoughts:  No  Memory:  Immediate;   Fair Recent;   Fair Remote;   Fair  Judgement:  Impaired  Insight:  Lacking  Psychomotor Activity:  Normal  Concentration:  Fair  Recall:  Fair  Akathisia:  No  Handed:  Right  AIMS (if indicated):     Assets:  Communication Skills Desire for Improvement  Sleep:  Number of Hours: 4    Vital Signs:Blood pressure 138/91, pulse 73, temperature 98.5 F (36.9 C), temperature source Oral, resp. rate 15, height 5\' 8"  (1.727 m), weight 84.823 kg (187 lb). Current Medications: Current Facility-Administered Medications  Medication Dose Route Frequency Provider Last Rate Last Dose  . acetaminophen (TYLENOL) tablet 650 mg  650 mg Oral Q6H PRN Mickeal Skinner, MD   650 mg at 02/12/12 1332  . alum & mag hydroxide-simeth (MAALOX/MYLANTA) 200-200-20 MG/5ML suspension 30 mL  30 mL Oral Q4H PRN Mickeal Skinner, MD      . amitriptyline (ELAVIL) tablet 10 mg  10 mg Oral NOW Mike Craze, MD   10 mg at 02/12/12 1525  . amitriptyline (ELAVIL) tablet 25 mg  25 mg Oral QHS Mike Craze, MD      . amitriptyline (ELAVIL) tablet 25 mg  25 mg Oral NOW Mike Craze, MD   25 mg at 02/12/12 1736  . aspirin EC tablet 81 mg  81 mg Oral Daily Mickeal Skinner, MD   81 mg at 02/12/12 1610  . carbamazepine (TEGRETOL) tablet 200 mg  200 mg Oral TID Mike Craze, MD      . chlorproMAZINE (THORAZINE) tablet 25 mg  25 mg Oral TID PRN Mike Craze, MD      . cloNIDine (CATAPRES - Dosed in mg/24 hr) patch 0.2 mg  0.2 mg Transdermal NOW Mike Craze, MD      . dicyclomine (BENTYL) tablet 20 mg  20 mg Oral Q6H PRN Mike Craze, MD      . Oletta Cohn ANALGESIC BALM OINT 1 application  1 application Apply externally PRN Mike Craze, MD      . hydrochlorothiazide (MICROZIDE) capsule 12.5 mg  12.5 mg Oral Daily Mickeal Skinner, MD   12.5 mg at 02/12/12 9604  . hydrOXYzine (ATARAX/VISTARIL) tablet 25 mg  25 mg Oral Q6H PRN Mike Craze, MD      . ibuprofen (ADVIL,MOTRIN) tablet 800 mg  800 mg Oral  Q6H PRN Mike Craze, MD      . loperamide (IMODIUM) capsule 2-4 mg  2-4 mg Oral PRN Mike Craze, MD      . magnesium hydroxide (MILK OF MAGNESIA) suspension 30 mL  30 mL Oral Daily PRN Mickeal Skinner, MD      . meloxicam (MOBIC) tablet 7.5 mg  7.5 mg Oral Daily Mike Craze, MD      . methocarbamol (ROBAXIN) tablet 500 mg  500 mg Oral Q8H PRN Mike Craze, MD      . mirtazapine (REMERON) tablet 7.5 mg  7.5 mg Oral QHS Mike Craze, MD      . nicotine (NICODERM CQ - dosed in mg/24 hours) patch 21 mg  21 mg Transdermal Daily Mike Craze, MD   21 mg at 02/12/12 1610  . ondansetron (ZOFRAN-ODT) disintegrating tablet 4 mg  4 mg Oral Q6H PRN Mike Craze, MD      . pantoprazole (PROTONIX) EC tablet 20 mg  20 mg Oral BID AC Mike Craze, MD      . DISCONTD: carbamazepine (TEGRETOL XR) 12 hr tablet 200 mg  200 mg  Oral QHS Mickie D. Adams, PA   200 mg at 02/11/12 2200  . DISCONTD: chlorproMAZINE (THORAZINE) tablet 25 mg  25 mg Oral TID Mike Craze, MD      . DISCONTD: clonazePAM Scarlette Calico) tablet 0.5 mg  0.5 mg Oral BID PRN Mickeal Skinner, MD   0.5 mg at 02/12/12 0925  . DISCONTD: HYDROcodone-acetaminophen (NORCO/VICODIN) 5-325 MG per tablet 1 tablet  1 tablet Oral Q6H PRN Mickie D. Adams, PA   1 tablet at 02/12/12 1525  . DISCONTD: mirtazapine (REMERON SOL-TAB) disintegrating tablet 30 mg  30 mg Oral QHS Mickie D. Adams, PA   30 mg at 02/11/12 2200  . DISCONTD: naproxen (NAPROSYN) tablet 500 mg  500 mg Oral BID PRN Mike Craze, MD        Lab Results:  Results for orders placed during the hospital encounter of 02/11/12 (from the past 48 hour(s))  TSH     Status: Normal   Collection Time   02/12/12  6:15 AM      Component Value Range Comment   TSH 0.357  0.350 - 4.500 uIU/mL     Physical Findings: AIMS: Facial and Oral Movements Muscles of Facial Expression: None, normal Lips and Perioral Area: None, normal Jaw: None, normal Tongue: None, normal,Extremity Movements Upper (arms, wrists, hands, fingers): None, normal Lower (legs, knees, ankles, toes): None, normal, Trunk Movements Neck, shoulders, hips: None, normal, Overall Severity Severity of abnormal movements (highest score from questions above): None, normal Incapacitation due to abnormal movements: None, normal Patient's awareness of abnormal movements (rate only patient's report): No Awareness, Dental Status Current problems with teeth and/or dentures?: No Does patient usually wear dentures?: No  CIWA:    COWS:     Treatment Plan Summary: Daily contact with patient to assess and evaluate symptoms and progress in treatment Medication management No signs/symptoms of withdrawal from abusable substances. No suicidal or homicidal thoughts for at least 48 hours. Mood/anxiety less than 3/10 where the scale is 1 is the best and 10 is  the worst  Plan: Admit, Start Elavil, Mobic, and Analgesic balm for fibromyalgia, stop opiates (use clonidine protocol and patch) and Benzodiazepines and replace with Tegretol TID with PRN Thorazine.  Discussed the risks, benefits, and probable clinical course with and without treatment.  Pt is agreeable to the  current course of treatment.  Eathen Budreau 02/12/2012, 7:56 PM

## 2012-02-12 NOTE — Tx Team (Signed)
Interdisciplinary Treatment Plan Update (Adult)  Date:  02/12/2012  Time Reviewed:  11:11 AM   Progress in Treatment: Attending groups: Yes Participating in groups:  Yes Taking medication as prescribed: Yes Tolerating medication:  Yes Family/Significant other contact made:  Counselor assessing for appropriate contact Patient understands diagnosis:  Yes Discussing patient identified problems/goals with staff:  Yes Medical problems stabilized or resolved:  Yes Denies suicidal/homicidal ideation: Yes Issues/concerns per patient self-inventory:  None identified Other: N/A  New problem(s) identified: None Identified  Reason for Continuation of Hospitalization: Anxiety Depression Medication stabilization Suicidal ideation  Interventions implemented related to continuation of hospitalization: mood stabilization, medication monitoring and adjustment, group therapy and psycho education, safety checks q 15 mins  Additional comments: N/A  Estimated length of stay: 3-5 days  Discharge Plan: SW will seek appropriate follow up.    New goal(s): N/A  Review of initial/current patient goals per problem list:    1.  Goal(s): Reduce depressive symptoms  Met:  No  Target date: by discharge  As evidenced by: Reducing depression from a 10 to a 3 as reported by pt.   2.  Goal (s): Reduce/Eliminate suicidal ideation  Met:  No  Target date: by discharge  As evidenced by: pt reporting no SI.    3.  Goal(s): Reduce anxiety symptoms  Met:  No  Target date: by discharge  As evidenced by: Reduce anxiety from a 10 to a 3 as reported by pt.    Attendees: Patient:  Mathew Brady 02/12/2012 11:12 AM   Family:     Physician:  Orson Aloe, MD  02/12/2012  11:11 AM   Nursing:   Dellia Cloud, RN 02/12/2012 11:12 AM   Case Manager:  Reyes Ivan, LCSWA 02/12/2012  11:11 AM   Counselor:  Angus Palms, LCSW 02/12/2012  11:11 AM   Other:  Juline Patch, LCSW 02/12/2012  11:11 AM   Other:  Cato Mulligan, RN 02/12/2012 11:12 AM   Other:     Other:      Scribe for Treatment Team:   Carmina Miller, 02/12/2012 , 11:11 AM

## 2012-02-12 NOTE — Progress Notes (Signed)
[facility] Group Notes:  (Counselor/Nursing/MHT/Case Management/Adjunct)  02/12/2012 6:59 PM  Type of Therapy:  Psychoeducational Skills  Participation Level:  Did Not Attend   Summary of Progress/Problems: Patient did not participate in group.   Ardelle Park O 02/12/2012, 6:59 PM

## 2012-02-12 NOTE — Progress Notes (Signed)
[facility] Group Notes:  (Counselor/Nursing/MHT/Case Management/Adjunct)  02/12/2012 10:24 PM  Type of Therapy:  NA Meeting  Participation Level:  Active  Participation Quality:  Appropriate  Affect:  Appropriate  Cognitive:  Appropriate  Insight:  Good  Engagement in Group:  Good  Engagement in Therapy:  Good  Modes of Intervention:  Support  Summary of Progress/Problems: Pt went to the 300 hall for NA meeting   Nichola Sizer 02/12/2012, 10:24 PMThe focus of this group is to help patients review their daily goal of treatment and discuss progress on daily workbooks.

## 2012-02-12 NOTE — Progress Notes (Signed)
Pt attended discharge planning group and actively participated in group.  SW provided pt with today's workbook.  Pt presents with flat affect and depressed mood.  Pt denies having depression, anxiety and states no SI "at the moment".  Pt's report is not congruent with his affect.  Pt states that he came to the hospital due to SI.  Pt states that he is in a lot of pain and has a lot of stress in his life.  Pt states that he has to care for his wife who has been sick with diabetes for the past 7 years.  Pt states that he lives in [place] with his wife and has transportation home.  Pt does not have a psychiatrist or therapist.  SW will refer pt to [name] for medication management and therapy.  No further needs voiced by pt at this time.    Reyes Ivan, LCSWA 02/12/2012  10:52 AM

## 2012-02-12 NOTE — Progress Notes (Signed)
[facility] Group Notes:  (Counselor/Nursing/MHT/Case Management/Adjunct)  02/12/2012 4:22 PM  Type of Therapy:  Psychoeducational Skills  Participation Level:  Did Not Attend  Summary of Progress/Problems: Mathew Brady refused to attend Psychoeducational group that focused on using quality time with positive support systems/individuals to engage in healthy coping skills.    Wandra Scot 02/12/2012, 4:22 PM

## 2012-02-12 NOTE — Progress Notes (Deleted)
Per pt's self inventory, pt slept fair, appetite is good, energy level is low, ability to pay attention is good, rates depression at a 9 out of 10 and hopelessness at an 8 out of 10, pt reports cravings, agitation, tremors, and chills, denies SI/HI?AVH, pt is c/o pain and dizziness, pain goal is 0, pt concerned about his ability to get his medications after d/c.  Pt also has been very upset all morning because he thinks that someone stole his "karate gi that my brother brought up her last night."  Called pt's sister and she states that neither her or the pt's brother brought the Karate gi to the hospital.  Pt having paranoid delusions.  [facility], transportation arrangements made by unit secretary.

## 2012-02-12 NOTE — Progress Notes (Signed)
Pt still having severe pain, see pain assessment, Notified Dr. Dan Humphreys that current pain management regimen is not working for pt, pt states that "the reason I was suicidal is because my pain has gotten so out of control, I think if I can get my pain under control then everything will be better."  Pt also has not been participating in groups today d/t his "pain level being too high."  Pt denies SI at this time and contracts for safety.  PRN Vicodin and Elavil given see MAR and pain reassessment for response.  Encouraged pt to attend groups.

## 2012-02-12 NOTE — Progress Notes (Signed)
[facility] Group Notes:  (Counselor/Nursing/MHT/Case Management/Adjunct) 02/12/2012  1:15pm-2:30pm Emotion Regulation   Type of Therapy:  Group Therapy  Participation Level:  Did Not Attend     Angus Palms, LCSW 02/12/2012  3:08 PM

## 2012-02-12 NOTE — Progress Notes (Signed)
D: Patient in room calm and cooperative on approach.  Patient complains of pain "all over" due to Fibromyalgia.  Patient endorses passive SI but denies HI and denies AVH.   Patient states, "I had a good day I just hurt so bad." Patient was getting ready for bed. A: Staff to monitor Q 15 mins for safety. Patient offered encouragement and support.  PRN hydrocodone administered for pain. R: Patient remains safe on the unit. Patient resting in room with eyes closed during reassessment of pain medication administered.

## 2012-02-12 NOTE — Progress Notes (Signed)
Per pt's self inventory, pt slept poorly, "did not sleep at all", pt reports poor appetite and states that he "didn't eat anything for breakfast."  Energy level is low, ability to pay attention is good, rates depression at a 3 out of 10 and hopelessness at a 3 out of 10, denies any withdrawal s/s, denies SI/HI, pt c/o pain 8 out of 10 and pain goal of 5, states that his biggest concern is getting his "pain under control".  Pt was seen in treatment team and pain management was discussed, plan per Dr. Dan Humphreys is to start pt on Elavil this evening.

## 2012-02-13 DIAGNOSIS — F1920 Other psychoactive substance dependence, uncomplicated: Secondary | ICD-10-CM

## 2012-02-13 MED ORDER — MIRTAZAPINE 15 MG PO TABS
15.0000 mg | ORAL_TABLET | Freq: Every day | ORAL | Status: DC
  Administered 2012-02-13: 15 mg via ORAL
  Filled 2012-02-13: qty 14
  Filled 2012-02-13 (×2): qty 1

## 2012-02-13 NOTE — Progress Notes (Addendum)
Pt attended discharge planning group and actively participated in group.  SW provided pt with today's workbook.  Pt presents with flat affect and depressed mood.  Pt rates depression and anxiety at a 5 today.  Pt endorses SI but contracts for safety.  Pt states that he feels worse today than on his admission.  Pt states that his meds have been changed and reports feeling physically worse today.  Pt reports poor sleep last night.  Pt will follow up [name] for medication management and therapy.  No further needs voiced by pt at this time.  Safety planning and suicide prevention discussed.  Pt participated in discussion and acknowledged an understanding of the information provided.       Mathew Brady, LCSWA 02/13/2012  10:55 AM    SW spoke with pt's wife, Mathew Brady [phone]).  [name] was asking about the progress of pt's treatment.  SW explained that pt is doing fine and adjusting to his meds.  [name] states that pt has a lot of issues, such as marital problems and past issues with his father.  [name] states that pt doesn't talk to them and doesn't address his issues with anyone.  [name] states that pt was closed off and would stay in his room a lot before admission.  SW discussed pt's d/c plans with [name].  [name] voiced no concerns about pt coming back home.

## 2012-02-13 NOTE — Progress Notes (Signed)
        [facility] Group Notes: (Counselor/Nursing/MHT/Case Management/Adjunct) 02/13/2012   @1 :15pm [facility]  Type of Therapy:  Group Therapy  Participation Level:  Good  Participation Quality:  Good  Affect:  Appropriate  Cognitive:  Appropriate  Insight:  Good  Engagement in Group:  Good  Engagement in Therapy:  Good  Modes of Intervention:  Support and Exploration  Summary of Progress/Problems: Mathew Brady participated with speaker from [facility] [place] and expressed interest in programs [facility] offers.   [name] 02/13/2012  2:49 PM

## 2012-02-13 NOTE — Progress Notes (Addendum)
[facility] MD Progress Note  02/13/2012 3:16 PM  S: "I'm doing fairly well. I did not sleep last night. The Dr. Rochele Pages me off of my pain medicines, my opiates. It messed me up. I have been on these pills for 13 years, off and on. I cannot function as it without sleep. I threatened to hurt myself because my pain was unbearable. That was what made them send me to this hospital. I did not come here to get off of my pain pills. My mood is okay".  Diagnosis:   Axis I: Polysubstance dependence, Tobacco dependence Axis II: Deferred Axis III:  Past Medical History  Diagnosis Date  . Hypertension   . Arthritis   . Fibromyalgia    Axis IV: other psychosocial or environmental problems Axis V: 51-60 moderate symptoms  ADL's:  Intact  Sleep: Poor  Appetite:  Fair  Suicidal Ideation:  Plan:  No Intent:  no Means:  No Homicidal Ideation:  Plan:  No Intent:  no Means:  no  AEB (as evidenced by): Per patient's reports  Mental Status Examination/Evaluation: Objective:  Appearance: Casual  Eye Contact::  Good  Speech:  Clear and Coherent  Volume:  Normal  Mood:  "okay"  Affect:  Flat  Thought Process:  Coherent  Orientation:  Full  Thought Content:  Rumination  Suicidal Thoughts:  No  Homicidal Thoughts:  No  Memory:  Immediate;   Good Recent;   Good Remote;   Good  Judgement:  Fair  Insight:  Fair  Psychomotor Activity:  Normal  Concentration:  Fair  Recall:  Good  Akathisia:  No  Handed:  Right  AIMS (if indicated):     Assets:  Desire for Improvement  Sleep:  Number of Hours: 1    Vital Signs:Blood pressure 144/98, pulse 108, temperature 97.8 F (36.6 C), temperature source Oral, resp. rate 16, height 5\' 8"  (1.727 m), weight 84.823 kg (187 lb). Current Medications: Current Facility-Administered Medications  Medication Dose Route Frequency Provider Last Rate Last Dose  . acetaminophen (TYLENOL) tablet 650 mg  650 mg Oral Q6H PRN Mickeal Skinner, MD   650 mg at 02/12/12 1332  .  alum & mag hydroxide-simeth (MAALOX/MYLANTA) 200-200-20 MG/5ML suspension 30 mL  30 mL Oral Q4H PRN Mickeal Skinner, MD      . amitriptyline (ELAVIL) tablet 10 mg  10 mg Oral NOW Mike Craze, MD   10 mg at 02/12/12 1525  . amitriptyline (ELAVIL) tablet 25 mg  25 mg Oral QHS Mike Craze, MD   25 mg at 02/12/12 2128  . amitriptyline (ELAVIL) tablet 25 mg  25 mg Oral NOW Mike Craze, MD   25 mg at 02/12/12 1736  . aspirin EC tablet 81 mg  81 mg Oral Daily Mickeal Skinner, MD   81 mg at 02/13/12 0806  . carbamazepine (TEGRETOL) tablet 200 mg  200 mg Oral TID Mike Craze, MD   200 mg at 02/13/12 1202  . chlorproMAZINE (THORAZINE) tablet 25 mg  25 mg Oral TID PRN Mike Craze, MD   25 mg at 02/12/12 2348  . cloNIDine (CATAPRES - Dosed in mg/24 hr) patch 0.2 mg  0.2 mg Transdermal NOW Mike Craze, MD   0.2 mg at 02/12/12 2105  . dicyclomine (BENTYL) tablet 20 mg  20 mg Oral Q6H PRN Mike Craze, MD      . Oletta Cohn ANALGESIC BALM OINT 1 application  1 application Apply externally PRN Mike Craze, MD      .  hydrochlorothiazide (MICROZIDE) capsule 12.5 mg  12.5 mg Oral Daily Mickeal Skinner, MD   12.5 mg at 02/13/12 0806  . hydrOXYzine (ATARAX/VISTARIL) tablet 25 mg  25 mg Oral Q6H PRN Mike Craze, MD   25 mg at 02/13/12 0343  . ibuprofen (ADVIL,MOTRIN) tablet 800 mg  800 mg Oral Q6H PRN Mike Craze, MD      . loperamide (IMODIUM) capsule 2-4 mg  2-4 mg Oral PRN Mike Craze, MD      . magnesium hydroxide (MILK OF MAGNESIA) suspension 30 mL  30 mL Oral Daily PRN Mickeal Skinner, MD      . meloxicam (MOBIC) tablet 7.5 mg  7.5 mg Oral Daily Mike Craze, MD   7.5 mg at 02/13/12 0806  . methocarbamol (ROBAXIN) tablet 500 mg  500 mg Oral Q8H PRN Mike Craze, MD   500 mg at 02/12/12 2131  . mirtazapine (REMERON) tablet 7.5 mg  7.5 mg Oral QHS Mike Craze, MD      . mirtazapine (REMERON) tablet 7.5 mg  7.5 mg Oral QHS Mike Craze, MD   7.5 mg at 02/12/12 2128  . nicotine (NICODERM  CQ - dosed in mg/24 hours) patch 21 mg  21 mg Transdermal Daily Mike Craze, MD   21 mg at 02/13/12 0805  . ondansetron (ZOFRAN-ODT) disintegrating tablet 4 mg  4 mg Oral Q6H PRN Mike Craze, MD   4 mg at 02/13/12 1205  . pantoprazole (PROTONIX) EC tablet 20 mg  20 mg Oral BID AC Mike Craze, MD   20 mg at 02/13/12 0636  . DISCONTD: carbamazepine (TEGRETOL XR) 12 hr tablet 200 mg  200 mg Oral QHS Mickie D. Adams, PA   200 mg at 02/11/12 2200  . DISCONTD: chlorproMAZINE (THORAZINE) tablet 25 mg  25 mg Oral TID Mike Craze, MD      . DISCONTD: clonazePAM Scarlette Calico) tablet 0.5 mg  0.5 mg Oral BID PRN Mickeal Skinner, MD   0.5 mg at 02/12/12 0925  . DISCONTD: HYDROcodone-acetaminophen (NORCO/VICODIN) 5-325 MG per tablet 1 tablet  1 tablet Oral Q6H PRN Mickie D. Adams, PA   1 tablet at 02/12/12 1525  . DISCONTD: mirtazapine (REMERON SOL-TAB) disintegrating tablet 30 mg  30 mg Oral QHS Mickie D. Adams, PA   30 mg at 02/11/12 2200  . DISCONTD: mirtazapine (REMERON) tablet 7.5 mg  7.5 mg Oral QHS Mike Craze, MD      . DISCONTD: naproxen (NAPROSYN) tablet 500 mg  500 mg Oral BID PRN Mike Craze, MD        Lab Results:  Results for orders placed during the hospital encounter of 02/11/12 (from the past 48 hour(s))  TSH     Status: Normal   Collection Time   02/12/12  6:15 AM      Component Value Range Comment   TSH 0.357  0.350 - 4.500 uIU/mL     Physical Findings: AIMS: Facial and Oral Movements Muscles of Facial Expression: None, normal Lips and Perioral Area: None, normal Jaw: None, normal Tongue: None, normal,Extremity Movements Upper (arms, wrists, hands, fingers): None, normal Lower (legs, knees, ankles, toes): None, normal, Trunk Movements Neck, shoulders, hips: None, normal, Overall Severity Severity of abnormal movements (highest score from questions above): None, normal Incapacitation due to abnormal movements: None, normal Patient's awareness of abnormal movements  (rate only patient's report): No Awareness, Dental Status Current problems with teeth and/or dentures?: No Does patient usually wear  dentures?: No  CIWA:    COWS:     Treatment Plan Summary: Daily contact with patient to assess and evaluate symptoms and progress in treatment Medication management  Plan: Increase Remeron from 7.5 to 15 mg Q bedtime for sleep. Continue current treatment plan.  Armandina Stammer I 02/13/2012, 3:16 PM  Agree with note Dan Humphreys, Natasha Paulson

## 2012-02-13 NOTE — Progress Notes (Signed)
Psychoeducational Group Note  Date:  02/13/2012 Time:  1100  Group Topic/Focus:  Building Self Esteem:   The Focus of this group is helping patients become aware of the effects of self-esteem on their lives, the things they and others do that enhance or undermine their self-esteem, seeing the relationship between their level of self-esteem and the choices they make and learning ways to enhance self-esteem.  Participation Level:  Active  Participation Quality:  Appropriate and Attentive  Affect:  Appropriate  Cognitive:  Appropriate  Insight:  Good  Engagement in Group:  Good  Additional Comments:  Pt participated in group. Pt shared the positive and negative things that can increase and decrease of physical, emotional, internal and external of self- esteem.   Karleen Hampshire Brittini 02/13/2012, 2:59 PM

## 2012-02-13 NOTE — Progress Notes (Signed)
Pt. About on the unit today.  Flat affect with depressed mood.  He denies SI/HI, contracts for safety.  He says that his medications are "not right".  He said he did not sleep well last night and that his energy level is low.  He complains of feeling nauseous.  He is attending groups but retires to room at times.  A:  Offered support and encouragement.  Given meds as prescribed and prn for nausea.  R:  Pt. Receptive to staff.  Remains on q 15 minute checks for safety.  Will continue to follow.

## 2012-02-13 NOTE — Progress Notes (Signed)
Pt is a new admit to the hall today.  He did not attend group tonight and stayed in the bed during that time.  He is to program on the 300 hall.  He is sad/withdrawn.  This writer has not observed any interaction with other patients this evening.  His main concern is his pain which he describes as "all over".  He states he does not sleep well and he anticipates a sleepless night already.   He says he has passive suicidal thoughts, but contracts for safety.  He denies HI/AV.  Pt is encouraged to make his needs known to staff.  Pt voices understanding.  Safety maintained with q15 minute checks.

## 2012-02-14 DIAGNOSIS — F1994 Other psychoactive substance use, unspecified with psychoactive substance-induced mood disorder: Secondary | ICD-10-CM | POA: Diagnosis present

## 2012-02-14 MED ORDER — CHLORPROMAZINE HCL 25 MG PO TABS: 25.0000 mg | ORAL_TABLET | Freq: Three times a day (TID) | ORAL | Status: DC | PRN

## 2012-02-14 MED ORDER — ASPIRIN 81 MG PO TBEC: 81.0000 mg | DELAYED_RELEASE_TABLET | Freq: Every day | ORAL | Status: DC

## 2012-02-14 MED ORDER — HYDROCHLOROTHIAZIDE 12.5 MG PO CAPS: 12.5000 mg | ORAL_CAPSULE | Freq: Every day | ORAL | Status: DC

## 2012-02-14 MED ORDER — PANTOPRAZOLE SODIUM 20 MG PO TBEC: 20.0000 mg | DELAYED_RELEASE_TABLET | Freq: Two times a day (BID) | ORAL | Status: DC

## 2012-02-14 MED ORDER — AMITRIPTYLINE HCL 25 MG PO TABS: 25.0000 mg | ORAL_TABLET | Freq: Every day | ORAL | Status: DC

## 2012-02-14 MED ORDER — CARBAMAZEPINE 200 MG PO TABS: 200.0000 mg | ORAL_TABLET | ORAL | Status: DC

## 2012-02-14 MED ORDER — MIRTAZAPINE 15 MG PO TABS: 15.0000 mg | ORAL_TABLET | Freq: Every day | ORAL | Status: DC

## 2012-02-14 MED ORDER — GRX ANALGESIC BALM EX OINT: 1.0000 "application " | TOPICAL_OINTMENT | Freq: Four times a day (QID) | CUTANEOUS | Status: DC | PRN

## 2012-02-14 MED ORDER — MELOXICAM 7.5 MG PO TABS: 7.5000 mg | ORAL_TABLET | Freq: Every day | ORAL | Status: DC

## 2012-02-14 NOTE — Progress Notes (Signed)
.  02/14/2012         Time: 1500      Group Topic/Focus: The focus of this group is on promoting emotional and psychological well-being through the process of creative expression, relaxation, socialization, fun and enjoyment.  Participation Level: Did not attend  Participation Quality: Not Applicable  Affect: Not Applicable  Cognitive: Not Applicable   Additional Comments: None.   Mathew Brady 02/14/2012 3:45 PM

## 2012-02-14 NOTE — Progress Notes (Addendum)
Mathew Brady is seen out in the hall this morning. He is shy, he speaks quietly and doesn't maintain eye contact  For long periods of time. He completes his self inventory and on it he wrote he denied SI within the past 24 hrs and he rated his depression a 5. He reports he " is still" having great difficulty falling and staying asleep...and this nurse processes with him the problem with continued and maintained dependence on xanax for sleep aide ( pt advocates xananx is " the only" thing he's ever taken that will help him with his sleep problem.      A He attends his groups but demonstrates  Limited insight into understanding his disease.      R Safety is in place and POC moves forward. PD RN [facility]

## 2012-02-14 NOTE — Progress Notes (Signed)
D: Pt in bed resting with eyes closed. Respirations even and unlabored. Pt appears to be in no signs of distress at this time. A: Q15min checks remains for this pt. R: Pt remains safe at this time.   

## 2012-02-14 NOTE — Progress Notes (Signed)
[facility] Case Management Discharge Plan:  Will you be returning to the same living situation after discharge: Yes,  return to own home At discharge, do you have transportation home?:Yes,  wife to pick pt up Do you have the ability to pay for your medications:Yes,  provided samples, access to meds   Release of information consent forms completed and in the chart;  Patient's signature needed at discharge.  Patient to Follow up at:  Follow-up Information    Follow up with [name] on 02/17/2012. (Appointment scheduled at 7:45 am, referral # [id number])    Contact information:   [address], [place] [address] [phone]         Patient denies SI/HI:   Yes,  denies SI/HI    Safety Planning and Suicide Prevention discussed:  Yes,  discussed with pt today  Barrier to discharge identified:No.  Summary and Recommendations: Pt attended discharge planning group and actively participated in group.  SW provided pt with today's workbook.  Pt presents with calm mood and affect.  Pt denies having depression, anxiety and SI.  PT reports feeling stable to discharge today.  No recommendations from SW.  No further needs voiced by pt.  Pt stable to discharge.     Carmina Miller 02/14/2012, 11:33 AM

## 2012-02-14 NOTE — Progress Notes (Signed)
[facility] Group Notes: (Counselor/Nursing/MHT/Case Management/Adjunct)  02/14/2012 @1 :15pm  Feelings about Diagnosis Type of Therapy: Group Therapy  Participation Level: Good  Participation Quality: Good  Affect: Appropriate  Cognitive: Appropriate  Insight: Good  Engagement in Group: Good  Engagement in Therapy: Good  Modes of Intervention: Support and Exploration  Summary of Progress/Problems: Pt actively listened to peers and was engaged in discussion about diagnoses.  Pt discussed learning about depression and learning that he is not alone with dealing with depression.  Pt discussed learning coping skills to deal with depression.    Mathew Brady, [place] 02/14/2012 2:49 PM

## 2012-02-14 NOTE — Progress Notes (Signed)
Mathew Brady is seen standing at the main nurses' station..he says he is " ready to go". HE completes his self inventory and on it he writes  He denied SI and states he plans to " go to my groups " when he is discharged.He is given medication samples, DC instructions with F/U appt date  and time and he states he can keep this appt. His SRA is completed by MD and this nurse and he is escorted to bldg entrance and DC'd per MD order.PD RN [facility]

## 2012-02-14 NOTE — [facility] Suicide Risk Assessment (Signed)
Suicide Risk Assessment  Discharge Assessment     Demographic factors:  Male;Caucasian;Low socioeconomic status  Current Mental Status Per Nursing Assessment::   On Admission:  Suicidal ideation indicated by patient;Suicide plan;Self-harm thoughts At Discharge:  The patient was seen today and reports to feeling better and feels ready for discharge.  The patient reports to having some difficulty initiating and maintaining sleep.  He reports that his appetite is good and he denies any significant feelings of sadness, anhedonia, or depressed mood.  He adamantly denies any suicidal or homicidal ideations as well as any auditory or visual hallucinations or delusional thinking.  The patient denies any anxiety symptoms today as well as any anxiety symptoms.  He states today that he feels ready for discharge to outpatient follow up and will be discharged today to return to [facility].  Current Mental Status Per Physician:  Diagnosis:  Axis I:  Substance Induced Mood Disorder.  Polysubstance Dependence - Opioids, Benzodiazepines, Nicotine.  Anxiety Disorder - NOS.  The patient was seen today and reports the following:   ADL's: Intact.  Sleep: The patient reports to having some difficulty initiating and maintaining sleep.  Appetite: The patient reports a good appetite.   Mild>(1-10) >Severe  Hopelessness (1-10): 0  Depression (1-10): 0  Anxiety (1-10): 0   Suicidal Ideation: The patient adamantly denies any suicidal ideations.  Plan: No  Intent: No  Means: No   Homicidal Ideation: The patient adamantly denies any homicidal ideations today.  Plan: No  Intent: No.  Means: No   General Appearance/Behavior: The patient was friendly and cooperative today  Eye Contact: Good.  Speech: Appropriate in rate and volume with no pressuring noted.  Motor Behavior: Appropriate.  Level of Consciousness: Alert and Oriented x 3.  Mental Status: Alert and Oriented x 3.  Mood: Essentially  Euthymic.  Affect: Mildly constricted.  Anxiety Level: No anxiety reported.  Thought Process: wnl.  Thought Content: The patient denies any auditory or visual hallucinations. He denies any delusional thinking today.  Perception:. wnl.  Judgment: Fair to Good.  Insight: Fair to Good.  Cognition: Oriented to time, place and person.   Loss Factors: Financial problems / change in socioeconomic status  Historical Factors: Impulsivity  History of Polysubstance Abuse/Dependence.  Risk Reduction Factors:    Continued Clinical Symptoms:  Alcohol/Substance Abuse/Dependencies Chronic Pain More than one psychiatric diagnosis Previous Psychiatric Diagnoses and Treatments Medical Diagnoses and Treatments/Surgeries  Discharge Diagnoses:   AXIS I:   Substance Induced Mood Disorder.   Polysubstance Dependence - Opioids, Benzodiazepines, Nicotine.   Anxiety Disorder - NOS. AXIS II:   Deferred. AXIS III:   1.  Hypertension.   2.  Arthritis.   3.  Fibromyalgia. AXIS IV:   Chronic Pain.  History of Substance Abuse Issues. AXIS V:   GAF at time of admission approximately 40.  GAF at time of discharge approximately 55.  Cognitive Features That Contribute To Risk:  None Noted.   Current Medications:    . amitriptyline  25 mg Oral QHS  . aspirin EC  81 mg Oral Daily  . carbamazepine  200 mg Oral TID  . cloNIDine  0.2 mg Transdermal NOW  . hydrochlorothiazide  12.5 mg Oral Daily  . meloxicam  7.5 mg Oral Daily  . mirtazapine  15 mg Oral QHS  . nicotine  21 mg Transdermal Daily  . pantoprazole  20 mg Oral BID AC  . DISCONTD: mirtazapine  7.5 mg Oral QHS   Review of Systems:  Neurological: The patient denies any headaches today. He denies any seizures or dizziness.  G.I.: The patient denies any constipation today or G.I. Upset.  Musculoskeletal: The patient reports chronic muscle pain related to his fibromyalgia.   The patient was seen today and reports to feeling better and feels  ready for discharge.  The patient reports to having some difficulty initiating and maintaining sleep.  He reports that his appetite is good and he denies any significant feelings of sadness, anhedonia, or depressed mood.  He adamantly denies any suicidal or homicidal ideations as well as any auditory or visual hallucinations or delusional thinking.  The patient denies any anxiety symptoms today as well as any anxiety symptoms.  He states today that he feels ready for discharge to outpatient follow up and will be discharged today to return to [facility].  Treatment Plan Summary:  1. Daily contact with patient to assess and evaluate symptoms and progress in treatment  2. Medication management  3. The patient will deny suicidal ideations or homicidal ideations for 48 hours prior to discharge and have a depression and anxiety rating of 3 or less. The patient will also deny any auditory or visual hallucinations or delusional thinking.  4. The patient will deny any symptoms of substance withdrawal at time of discharge.   Plan:  1. Will continue the patient on his current medications as listed above. 2. Laboratory studies reviewed.  3. Will continue to monitor.  4. The patient will be discharged today to outpatient follow up.  Suicide Risk:  Minimal: No identifiable suicidal ideation.  Patients presenting with no risk factors but with morbid ruminations; may be classified as minimal risk based on the severity of the depressive symptoms  Plan Of Care/Follow-up recommendations:  Activity:  As tolerated. Diet:  Heart Healthy Diet. Other:  Please take all medications only as directed and keep all scheduled follow up appointments.  Please abstain from any use of alcohol or illicit drugs.  Mathew Brady 02/14/2012, 1:26 PM

## 2012-02-14 NOTE — Tx Team (Signed)
Interdisciplinary Treatment Plan Update (Adult)  Date:  02/14/2012  Time Reviewed:  10:21 AM   Progress in Treatment: Attending groups: Yes Participating in groups:  Yes Taking medication as prescribed: Yes Tolerating medication:  Yes Family/Significant other contact made: Yes, contact made with wife Patient understands diagnosis:  Yes Discussing patient identified problems/goals with staff:  Yes Medical problems stabilized or resolved:  Yes Denies suicidal/homicidal ideation: Yes Issues/concerns per patient self-inventory:  None identified Other: N/A  New problem(s) identified: None Identified  Reason for Continuation of Hospitalization: Stable to d/c  Interventions implemented related to continuation of hospitalization: Stable to d/c  Additional comments: N/A  Estimated length of stay: D/C today  Discharge Plan: Pt will follow up with [name] for medication management and therapy.    New goal(s): N/A  Review of initial/current patient goals per problem list:    1.  Goal(s): Reduce depressive symptoms  Met:  Yes  Target date: by discharge  As evidenced by: Reducing depression from a 10 to a 3 as reported by pt.  Pt rates at a 0 today.   2.  Goal (s): Reduce/Eliminate suicidal ideation  Met:  Yes  Target date: by discharge  As evidenced by: pt reporting no SI.    3.  Goal(s): Reduce anxiety symptoms  Met:  Yes  Target date: by discharge  As evidenced by: Reduce anxiety from a 10 to a 3 as reported by pt. Pt rates at a 0 today.     Attendees: Patient:  Mathew Brady 02/14/2012 10:21 AM   Family:     Physician:  Franchot Gallo, MD 02/14/2012 10:21 AM   Nursing:      Case Manager:  Reyes Ivan, LCSWA 02/14/2012 10:21 AM  Counselor:     Other:    Other:    Other:     Other:      Scribe for Treatment Team:   Carmina Miller, 02/14/2012 10:21 AM

## 2012-02-17 NOTE — Progress Notes (Signed)
Patient Discharge Instructions:  After Visit Summary (AVS):   Faxed to:  02/17/2012 Psychiatric Admission Assessment Note:   Faxed to:  02/17/2012 Suicide Risk Assessment - Discharge Assessment:   Faxed to:  02/17/2012 Faxed/Sent to the Next Level Care provider:  02/17/2012  Faxed to [facility] @ [phone]  Heloise Purpura, Eduard Clos, 02/17/2012, 4:52 PM

## 2012-03-22 NOTE — Discharge Summary (Signed)
Physician Discharge Summary Note  Patient:  Mathew Brady is an 46 y.o., male MRN:  [id number] DOB:  14-Aug-1965 Patient phone:  [phone] (home)  Patient address:   [address] [place] [address]   Date of Admission:  02/11/2012 Date of Discharge: 02/14/2012  Discharge Diagnoses: Principal Problem:  *Substance induced mood disorder Active Problems:  TOBACCO DEPENDENCE  MIGRAINE, UNSPEC., W/O INTRACTABLE MIGRAINE  FIBROMYALGIA, FIBROMYOSITIS  Anxiety  Benign hypertension  Opiate dependence  Benzodiazepine dependence  Sleep difficulties  Polysubstance dependence  Axis Diagnosis:  AXIS I: Substance Induced Mood Disorder.  Polysubstance Dependence - Opioids, Benzodiazepines, Nicotine.  Anxiety Disorder - NOS.  AXIS II: Deferred.  AXIS III: 1. Hypertension.  2. Arthritis.  3. Fibromyalgia.  AXIS IV: Chronic Pain. History of Substance Abuse Issues.  AXIS V: GAF at time of admission approximately 40. GAF at time of discharge approximately 55.   Level of Care:  In patient hospitalization. Reason For Admission:  Mother and wife took gun away. He and wife have just moved into an apartment. Only income is wife's disability $878/month. 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 He is prescribed Xanax and Hydrocodone for nerves fibromyalgia and has chronic migraines.  Had a "nervous breakdown " 2008 and was admitted here but did not follow through post discharge with psychiatric care.  Today he is worried about how to pay all the bills and get he and wife's meds.  Wants his nerves straightened out and to sleep al;so to get rid of chronic headache.   Hospital Course:   The patient attended treatment team meeting this am and met with treatment team members. The patient's symptoms, treatment plan and response to treatment was discussed. The patient endorsed that their symptoms have improved. The patient also stated that they felt stable for  discharge.  They reported that from this hospital stay they had learned many coping skills.  In other to maintain their psychiatric stability, they will continue psychiatric care on an outpatient basis. They will follow-up as outlined below.  In addition they were instructed  to take all your medications as prescribed by their mental healthcare provider and to report any adverse effects and or reactions from your medicines to their outpatient provider promptly.  The patient is also instructed and cautioned to not engage in alcohol and or illegal drug use while on prescription medicines.  In the event of worsening symptoms the patient is instructed to call the crisis hotline, 911 and or go to the nearest ED for appropriate evaluation and treatment of symptoms.   Also while a patient in this hospital, the patient received medication management for his psychiatric symptoms. They were ordered and received as outlined below:    Medication List     As of 03/22/2012  4:35 PM    STOP taking these medications         ALPRAZolam 0.25 MG tablet   Commonly known as: XANAX      HYDROcodone-acetaminophen 10-325 MG per tablet   Commonly known as: NORCO      TAKE these medications      Indication    amitriptyline 25 MG tablet   Commonly known as: ELAVIL   Take 1 tablet (25 mg total) by mouth at bedtime. For sleep and chronic pain.       aspirin 81 MG EC tablet   Take 1 tablet (81 mg total) by mouth daily. For blood thinner.  carbamazepine 200 MG tablet   Commonly known as: TEGRETOL   Take 1 tablet (200 mg total) by mouth 3 (three) times daily at 8am, 2pm and bedtime. For mood stabilization.       GRX ANALGESIC BALM Oint   Apply 1 application topically 4 (four) times daily as needed (pain).       hydrochlorothiazide 12.5 MG capsule   Commonly known as: MICROZIDE   Take 1 capsule (12.5 mg total) by mouth daily. For blood pressure.       meloxicam 7.5 MG tablet   Commonly known as: MOBIC    Take 1 tablet (7.5 mg total) by mouth daily. For pain.       mirtazapine 15 MG tablet   Commonly known as: REMERON   Take 1 tablet (15 mg total) by mouth at bedtime. For sleep and depression.       pantoprazole 20 MG tablet   Commonly known as: PROTONIX   Take 1 tablet (20 mg total) by mouth 2 (two) times daily before a meal. For reflux.        They were also enrolled in group counseling sessions and activities in which they participated actively.       Follow-up Information    Follow up with [name] on 02/17/2012. (Appointment scheduled at 7:45 am, referral # [id number])    Contact information:   [address] [place], [place] [address] [phone]        Upon discharge, patient adamantly denies suicidal, homicidal ideations, auditory, visual hallucinations and or delusional thinking. They left [facility] with all personal belongings via personal transportation in no apparent distress.  Consults:   Please see electronic medical record for details.  Significant Diagnostic Studies: Please see electronic medical record for details.   Discharge Vitals:   Blood pressure 142/86, pulse 96, temperature 98.6 F (37 C), temperature source Oral, resp. rate 24, height 5\' 8"  (1.727 m), weight 84.823 kg (187 lb)..  Mental Status Exam: Demographic factors:  Male;Caucasian;Low socioeconomic status  Current Mental Status Per Nursing Assessment::  On Admission: Suicidal ideation indicated by patient;Suicide plan;Self-harm thoughts  At Discharge: The patient was seen today and reports to feeling better and feels ready for discharge. The patient reports to having some difficulty initiating and maintaining sleep. He reports that his appetite is good and he denies any significant feelings of sadness, anhedonia, or depressed mood. He adamantly denies any suicidal or homicidal ideations as well as any auditory or visual hallucinations or delusional thinking. The patient denies any anxiety symptoms today as well  as any anxiety symptoms. He states today that he feels ready for discharge to outpatient follow up and will be discharged today to return to [facility].  Current Mental Status Per Physician:  Diagnosis:  Axis I: Substance Induced Mood Disorder.  Polysubstance Dependence - Opioids, Benzodiazepines, Nicotine.  Anxiety Disorder - NOS.  The patient was seen today and reports the following:  ADL's: Intact.  Sleep: The patient reports to having some difficulty initiating and maintaining sleep.  Appetite: The patient reports a good appetite.  Mild>(1-10) >Severe  Hopelessness (1-10): 0  Depression (1-10): 0  Anxiety (1-10): 0  Suicidal Ideation: The patient adamantly denies any suicidal ideations.  Plan: No  Intent: No  Means: No  Homicidal Ideation: The patient adamantly denies any homicidal ideations today.  Plan: No  Intent: No.  Means: No  General Appearance/Behavior: The patient was friendly and cooperative today  Eye Contact: Good.  Speech: Appropriate in rate and  volume with no pressuring noted.  Motor Behavior: Appropriate.  Level of Consciousness: Alert and Oriented x 3.  Mental Status: Alert and Oriented x 3.  Mood: Essentially Euthymic.  Affect: Mildly constricted.  Anxiety Level: No anxiety reported.  Thought Process: wnl.  Thought Content: The patient denies any auditory or visual hallucinations. He denies any delusional thinking today.  Perception:. wnl.  Judgment: Fair to Good.  Insight: Fair to Good.  Cognition: Oriented to time, place and person.  Loss Factors:  Financial problems / change in socioeconomic status  Historical Factors:  Impulsivity History of Polysubstance Abuse/Dependence.  Risk Reduction Factors:  Continued Clinical Symptoms:  Alcohol/Substance Abuse/Dependencies  Chronic Pain  More than one psychiatric diagnosis  Previous Psychiatric Diagnoses and Treatments  Medical Diagnoses and Treatments/Surgeries  Discharge Diagnoses:  AXIS I:  Substance Induced Mood Disorder.  Polysubstance Dependence - Opioids, Benzodiazepines, Nicotine.  Anxiety Disorder - NOS.  AXIS II: Deferred.  AXIS III: 1. Hypertension.  2. Arthritis.  3. Fibromyalgia.  AXIS IV: Chronic Pain. History of Substance Abuse Issues.  AXIS V: GAF at time of admission approximately 40. GAF at time of discharge approximately 55.  Cognitive Features That Contribute To Risk:  None Noted.  Current Medications:   .  amitriptyline  25 mg  Oral  QHS   .  aspirin EC  81 mg  Oral  Daily   .  carbamazepine  200 mg  Oral  TID   .  cloNIDine  0.2 mg  Transdermal  NOW   .  hydrochlorothiazide  12.5 mg  Oral  Daily   .  meloxicam  7.5 mg  Oral  Daily   .  mirtazapine  15 mg  Oral  QHS   .  nicotine  21 mg  Transdermal  Daily   .  pantoprazole  20 mg  Oral  BID AC   .  DISCONTD: mirtazapine  7.5 mg  Oral  QHS    Review of Systems:  Neurological: The patient denies any headaches today. He denies any seizures or dizziness.  G.I.: The patient denies any constipation today or G.I. Upset.  Musculoskeletal: The patient reports chronic muscle pain related to his fibromyalgia.  The patient was seen today and reports to feeling better and feels ready for discharge. The patient reports to having some difficulty initiating and maintaining sleep. He reports that his appetite is good and he denies any significant feelings of sadness, anhedonia, or depressed mood. He adamantly denies any suicidal or homicidal ideations as well as any auditory or visual hallucinations or delusional thinking. The patient denies any anxiety symptoms today as well as any anxiety symptoms. He states today that he feels ready for discharge to outpatient follow up and will be discharged today to return to [facility].  Treatment Plan Summary:  1. Daily contact with patient to assess and evaluate symptoms and progress in treatment  2. Medication management  3. The patient will deny suicidal ideations or  homicidal ideations for 48 hours prior to discharge and have a depression and anxiety rating of 3 or less. The patient will also deny any auditory or visual hallucinations or delusional thinking.  4. The patient will deny any symptoms of substance withdrawal at time of discharge.  Plan:  1. Will continue the patient on his current medications as listed above.  2. Laboratory studies reviewed.  3. Will continue to monitor.  4. The patient will be discharged today to outpatient follow up.  Suicide Risk:  Minimal: No identifiable suicidal ideation. Patients presenting with no risk factors but with morbid ruminations; may be classified as minimal risk based on the severity of the depressive symptoms  Plan Of Care/Follow-up recommendations:  Activity: As tolerated.  Diet: Heart Healthy Diet.  Other: Please take all medications only as directed and keep all scheduled follow up appointments. Please abstain from any use of alcohol or illicit drugs.  Discharge destination:  Home.  Is patient on multiple antipsychotic therapies at discharge:  No  Has Patient had three or more failed trials of antipsychotic monotherapy by history: N/A Recommended Plan for Multiple Antipsychotic Therapies: N/A Discharge Orders    Future Orders Please Complete By Expires   Diet - low sodium heart healthy      Increase activity slowly      Discharge instructions      Comments:   Please take all medications only as directed and keep all scheduled follow up appointments.  Please abstain from any use of alcohol or illicit drugs.       Medication List     As of 03/22/2012  4:35 PM    STOP taking these medications         ALPRAZolam 0.25 MG tablet   Commonly known as: XANAX      HYDROcodone-acetaminophen 10-325 MG per tablet   Commonly known as: NORCO      TAKE these medications      Indication    amitriptyline 25 MG tablet   Commonly known as: ELAVIL   Take 1 tablet (25 mg total) by mouth at bedtime. For  sleep and chronic pain.       aspirin 81 MG EC tablet   Take 1 tablet (81 mg total) by mouth daily. For blood thinner.       carbamazepine 200 MG tablet   Commonly known as: TEGRETOL   Take 1 tablet (200 mg total) by mouth 3 (three) times daily at 8am, 2pm and bedtime. For mood stabilization.       GRX ANALGESIC BALM Oint   Apply 1 application topically 4 (four) times daily as needed (pain).       hydrochlorothiazide 12.5 MG capsule   Commonly known as: MICROZIDE   Take 1 capsule (12.5 mg total) by mouth daily. For blood pressure.       meloxicam 7.5 MG tablet   Commonly known as: MOBIC   Take 1 tablet (7.5 mg total) by mouth daily. For pain.       mirtazapine 15 MG tablet   Commonly known as: REMERON   Take 1 tablet (15 mg total) by mouth at bedtime. For sleep and depression.       pantoprazole 20 MG tablet   Commonly known as: PROTONIX   Take 1 tablet (20 mg total) by mouth 2 (two) times daily before a meal. For reflux.            Follow-up Information    Follow up with [name] on 02/17/2012. (Appointment scheduled at 7:45 am, referral # [id number])    Contact information:   [address] [place], [place] [address] [phone]        Follow-up recommendations:   Activities: Resume typical activities Diet: Resume typical diet Other: Follow up with outpatient provider and report any side effects to out patient prescriber.  Comments:  Take all your medications as prescribed by your mental healthcare provider. Report any adverse effects and or reactions from your medicines to your outpatient provider promptly. Patient is  instructed and cautioned to not engage in alcohol and or illegal drug use while on prescription medicines. In the event of worsening symptoms, patient is instructed to call the crisis hotline, 911 and or go to the nearest ED for appropriate evaluation and treatment of symptoms.  SignedFranchot Gallo 03/22/2012 4:35 PM

## 2012-05-22 ENCOUNTER — Encounter (HOSPITAL_COMMUNITY): Payer: Self-pay | Admitting: Emergency Medicine

## 2012-05-22 ENCOUNTER — Emergency Department (HOSPITAL_COMMUNITY)
Admission: EM | Admit: 2012-05-22 | Discharge: 2012-05-23 | Disposition: A | Discharge status: Home / Self Care | Payer: Self-pay | Attending: Emergency Medicine | Admitting: Emergency Medicine

## 2012-05-22 DIAGNOSIS — F411 Generalized anxiety disorder: Secondary | ICD-10-CM | POA: Insufficient documentation

## 2012-05-22 DIAGNOSIS — R059 Cough, unspecified: Secondary | ICD-10-CM | POA: Insufficient documentation

## 2012-05-22 DIAGNOSIS — R0602 Shortness of breath: Secondary | ICD-10-CM

## 2012-05-22 DIAGNOSIS — B360 Pityriasis versicolor: Secondary | ICD-10-CM

## 2012-05-22 DIAGNOSIS — Z79899 Other long term (current) drug therapy: Secondary | ICD-10-CM | POA: Insufficient documentation

## 2012-05-22 DIAGNOSIS — I10 Essential (primary) hypertension: Secondary | ICD-10-CM | POA: Insufficient documentation

## 2012-05-22 DIAGNOSIS — IMO0001 Reserved for inherently not codable concepts without codable children: Secondary | ICD-10-CM | POA: Insufficient documentation

## 2012-05-22 DIAGNOSIS — M129 Arthropathy, unspecified: Secondary | ICD-10-CM | POA: Insufficient documentation

## 2012-05-22 DIAGNOSIS — J449 Chronic obstructive pulmonary disease, unspecified: Secondary | ICD-10-CM | POA: Insufficient documentation

## 2012-05-22 DIAGNOSIS — F17200 Nicotine dependence, unspecified, uncomplicated: Secondary | ICD-10-CM | POA: Insufficient documentation

## 2012-05-22 DIAGNOSIS — J4489 Other specified chronic obstructive pulmonary disease: Secondary | ICD-10-CM | POA: Insufficient documentation

## 2012-05-22 DIAGNOSIS — R062 Wheezing: Secondary | ICD-10-CM

## 2012-05-22 DIAGNOSIS — Z7982 Long term (current) use of aspirin: Secondary | ICD-10-CM | POA: Insufficient documentation

## 2012-05-22 DIAGNOSIS — R05 Cough: Secondary | ICD-10-CM | POA: Insufficient documentation

## 2012-05-22 HISTORY — DX: Anxiety disorder, unspecified: F41.9

## 2012-05-22 HISTORY — DX: Chronic obstructive pulmonary disease, unspecified: J44.9

## 2012-05-22 MED ORDER — GUAIFENESIN-CODEINE 100-10 MG/5ML PO SOLN
5.0000 mL | Freq: Once | ORAL | Status: AC
  Administered 2012-05-23: 5 mL via ORAL
  Filled 2012-05-22: qty 5

## 2012-05-22 MED ORDER — PREDNISONE 20 MG PO TABS
ORAL_TABLET | ORAL | Status: AC
  Filled 2012-05-22: qty 3

## 2012-05-22 MED ORDER — PREDNISONE 50 MG PO TABS
60.0000 mg | ORAL_TABLET | Freq: Once | ORAL | Status: AC
  Administered 2012-05-22: 60 mg via ORAL

## 2012-05-22 MED ORDER — ALBUTEROL SULFATE (5 MG/ML) 0.5% IN NEBU
2.5000 mg | INHALATION_SOLUTION | Freq: Once | RESPIRATORY_TRACT | Status: AC
  Administered 2012-05-22: 2.5 mg via RESPIRATORY_TRACT
  Filled 2012-05-22: qty 0.5

## 2012-05-22 MED ORDER — IPRATROPIUM BROMIDE 0.02 % IN SOLN
0.5000 mg | Freq: Once | RESPIRATORY_TRACT | Status: AC
Start: 2012-05-22 — End: 2012-05-22
  Administered 2012-05-22: 0.5 mg via RESPIRATORY_TRACT
  Filled 2012-05-22: qty 2.5

## 2012-05-22 NOTE — ED Provider Notes (Signed)
History  This chart was scribed for [employer]. Colon Branch, MD by Shari Heritage, ED Scribe. The patient was seen in room APA06/APA06. Patient's care was started at 2308.  CSN: [id number]  Arrival date & time 05/22/12  2233   First MD Initiated Contact with Patient 05/22/12 2308      Chief Complaint  Patient presents with  . Shortness of Breath    The history is provided by the patient. No language interpreter was used.    HPI Comments: Mathew Brady is a 46 y.o. male brought in by EMS who presents to the Emergency Department complaining ofSOB that is worse with exertion onset this evening. There is an associated cough. Patient states that his breathing has improved tonight after a breathing treatment via EMS. Patient states that he was just treated for a URI by his PCP. Patient had a cough with green sputum and he took a 10-day course of penicillin. He states that his cough is now returning. Patient uses 2L  of oxygen at home as needed. Patient also has a reddened rash to upper chest and back. Patient has a medical history of HTN and COPD. Patient is a current every day smoker.  PCP Dr. Allena Katz   Past Medical History  Diagnosis Date  . Hypertension   . Arthritis   . Fibromyalgia   . COPD (chronic obstructive pulmonary disease)   . Anxiety     History reviewed. No pertinent past surgical history.  No family history on file.  History  Substance Use Topics  . Smoking status: Current Every Day Smoker -- 2.0 packs/day for 10 years  . Smokeless tobacco: Not on file  . Alcohol Use: No      Review of Systems  Constitutional: Negative for fever.       10 Systems reviewed and are negative for acute change except as noted in the HPI.  HENT: Negative for congestion.   Eyes: Negative for discharge and redness.  Respiratory: Positive for cough, shortness of breath and wheezing.   Cardiovascular: Negative for chest pain.  Gastrointestinal: Negative for vomiting and abdominal pain.    Musculoskeletal: Negative for back pain.  Skin: Negative for rash.  Neurological: Negative for syncope, numbness and headaches.  Psychiatric/Behavioral:       No behavior change.   A complete 10 system review of systems was obtained and all systems are negative except as noted in the HPI and PMH.   Allergies  Gabapentin  Home Medications   Current Outpatient Rx  Name  Route  Sig  Dispense  Refill  . ALPRAZOLAM 1 MG PO TABS   Oral   Take 1 mg by mouth 2 (two) times daily.         Marland Kitchen AMITRIPTYLINE HCL 25 MG PO TABS   Oral   Take 1 tablet (25 mg total) by mouth at bedtime. For sleep and chronic pain.   30 tablet   0   . ASPIRIN 81 MG PO TBEC   Oral   Take 1 tablet (81 mg total) by mouth daily. For blood thinner.   30 tablet   0   . BUSPIRONE HCL 10 MG PO TABS   Oral   Take 10 mg by mouth 2 (two) times daily.         Marland Kitchen CARBAMAZEPINE 200 MG PO TABS   Oral   Take 1 tablet (200 mg total) by mouth 3 (three) times daily at 8am, 2pm and bedtime. For mood stabilization.  90 tablet   0   . HYDROCHLOROTHIAZIDE 12.5 MG PO CAPS   Oral   Take 1 capsule (12.5 mg total) by mouth daily. For blood pressure.   30 capsule   0   . MELOXICAM 7.5 MG PO TABS   Oral   Take 1 tablet (7.5 mg total) by mouth daily. For pain.   30 tablet   0   . GRX ANALGESIC BALM EX OINT   Apply externally   Apply 1 application topically 4 (four) times daily as needed (pain).   410 g   0   . MIRTAZAPINE 15 MG PO TABS   Oral   Take 1 tablet (15 mg total) by mouth at bedtime. For sleep and depression.   30 tablet   0   . PANTOPRAZOLE SODIUM 20 MG PO TBEC   Oral   Take 1 tablet (20 mg total) by mouth 2 (two) times daily before a meal. For reflux.   60 tablet   0     BP 136/79  Pulse 106  Temp 98.7 F (37.1 C) (Oral)  Resp 32  Ht 5\' 7"  (1.702 m)  Wt 220 lb (99.791 kg)  BMI 34.46 kg/m2  SpO2 97%  Physical Exam  Constitutional: He is oriented to person, place, and time. He  appears well-developed and well-nourished.  HENT:  Head: Normocephalic and atraumatic.  Eyes: EOM are normal. Pupils are equal, round, and reactive to light.  Neck: Normal range of motion. Neck supple.  Cardiovascular: Normal rate and regular rhythm.   No murmur heard. Pulmonary/Chest: Effort normal. He has wheezes (occasional).  Abdominal: He exhibits no distension.  Musculoskeletal: Normal range of motion.  Neurological: He is alert and oriented to person, place, and time. Coordination normal.  Skin: Skin is warm and dry. Rash noted.       Tinea versicolor to upper chest and back.  Psychiatric: He has a normal mood and affect. His behavior is normal.    ED Course  Procedures (including critical care time) DIAGNOSTIC STUDIES: Oxygen Saturation is 96% on room air, adequate by my interpretation.    COORDINATION OF CARE: 11:19 PM- Patient informed of current plan for treatment and evaluation and agrees with plan at this time.   1610 Patient feels better after albuterol.atrovent treatment. Wheezing has resolved.    MDM  Patient with COPD here with cough and wheezing that began tonight. Given albuterol en route and albuterol/atrovent when he arrived with improvement. Initiated steroid treatment.  Pt feels improved after observation and/or treatment in ED.Pt stable in ED with no significant deterioration in condition.The patient appears reasonably screened and/or stabilized for discharge and I doubt any other medical condition or other [facility] requiring further screening, evaluation, or treatment in the ED at this time prior to discharge.  I personally performed the services described in this documentation, which was scribed in my presence. The recorded information has been reviewed and considered.   MDM Reviewed: nursing note and vitals           Nicoletta Dress. Colon Branch, MD 05/23/12 [phone]

## 2012-05-22 NOTE — Progress Notes (Signed)
Pt is more upper airway wheezes

## 2012-05-22 NOTE — ED Notes (Signed)
Per EMS: This evening pt c/o sob and  trouble breathing with ambulation; reported syncopal episode but only remembers part of it. Pt has recently been seen by  PCP for productive cough with thick green sputum; took penicillin for 10 days which was finished. Now cough is coming back.  EMS gave albuterol neb and pt began to breathe easier

## 2012-05-23 MED ORDER — ALBUTEROL SULFATE HFA 108 (90 BASE) MCG/ACT IN AERS: 1.0000 | INHALATION_SPRAY | Freq: Four times a day (QID) | RESPIRATORY_TRACT | Status: DC | PRN

## 2012-05-23 MED ORDER — PREDNISONE 10 MG PO TABS: 20.0000 mg | ORAL_TABLET | Freq: Every day | ORAL | Status: DC

## 2012-05-23 MED ORDER — GUAIFENESIN-CODEINE 100-10 MG/5ML PO SYRP: 5.0000 mL | ORAL_SOLUTION | Freq: Three times a day (TID) | ORAL | Status: DC | PRN

## 2012-06-17 ENCOUNTER — Encounter (HOSPITAL_COMMUNITY): Payer: Self-pay | Admitting: Emergency Medicine

## 2012-06-17 ENCOUNTER — Observation Stay (HOSPITAL_COMMUNITY)
Admission: EM | Admit: 2012-06-17 | Discharge: 2012-06-18 | Disposition: A | Discharge status: Home / Self Care | Payer: MEDICAID | Attending: Internal Medicine | Admitting: Internal Medicine

## 2012-06-17 ENCOUNTER — Emergency Department (HOSPITAL_COMMUNITY): Payer: Self-pay

## 2012-06-17 DIAGNOSIS — R531 Weakness: Secondary | ICD-10-CM

## 2012-06-17 DIAGNOSIS — IMO0001 Reserved for inherently not codable concepts without codable children: Secondary | ICD-10-CM | POA: Insufficient documentation

## 2012-06-17 DIAGNOSIS — F1994 Other psychoactive substance use, unspecified with psychoactive substance-induced mood disorder: Secondary | ICD-10-CM

## 2012-06-17 DIAGNOSIS — K219 Gastro-esophageal reflux disease without esophagitis: Secondary | ICD-10-CM | POA: Insufficient documentation

## 2012-06-17 DIAGNOSIS — E876 Hypokalemia: Secondary | ICD-10-CM | POA: Diagnosis present

## 2012-06-17 DIAGNOSIS — R21 Rash and other nonspecific skin eruption: Secondary | ICD-10-CM | POA: Diagnosis present

## 2012-06-17 DIAGNOSIS — D649 Anemia, unspecified: Secondary | ICD-10-CM | POA: Diagnosis present

## 2012-06-17 DIAGNOSIS — J449 Chronic obstructive pulmonary disease, unspecified: Secondary | ICD-10-CM

## 2012-06-17 DIAGNOSIS — J4489 Other specified chronic obstructive pulmonary disease: Secondary | ICD-10-CM | POA: Insufficient documentation

## 2012-06-17 DIAGNOSIS — F1920 Other psychoactive substance dependence, uncomplicated: Secondary | ICD-10-CM

## 2012-06-17 DIAGNOSIS — K029 Dental caries, unspecified: Secondary | ICD-10-CM

## 2012-06-17 DIAGNOSIS — F1320 Sedative, hypnotic or anxiolytic dependence, uncomplicated: Secondary | ICD-10-CM | POA: Diagnosis present

## 2012-06-17 DIAGNOSIS — F411 Generalized anxiety disorder: Secondary | ICD-10-CM | POA: Insufficient documentation

## 2012-06-17 DIAGNOSIS — M797 Fibromyalgia: Secondary | ICD-10-CM | POA: Diagnosis present

## 2012-06-17 DIAGNOSIS — R7309 Other abnormal glucose: Secondary | ICD-10-CM | POA: Insufficient documentation

## 2012-06-17 DIAGNOSIS — R7303 Prediabetes: Secondary | ICD-10-CM | POA: Diagnosis present

## 2012-06-17 DIAGNOSIS — R002 Palpitations: Secondary | ICD-10-CM | POA: Diagnosis present

## 2012-06-17 DIAGNOSIS — I10 Essential (primary) hypertension: Secondary | ICD-10-CM | POA: Diagnosis present

## 2012-06-17 DIAGNOSIS — F419 Anxiety disorder, unspecified: Secondary | ICD-10-CM | POA: Diagnosis present

## 2012-06-17 DIAGNOSIS — G479 Sleep disorder, unspecified: Secondary | ICD-10-CM

## 2012-06-17 DIAGNOSIS — G43909 Migraine, unspecified, not intractable, without status migrainosus: Secondary | ICD-10-CM

## 2012-06-17 DIAGNOSIS — R000 Tachycardia, unspecified: Secondary | ICD-10-CM | POA: Insufficient documentation

## 2012-06-17 DIAGNOSIS — F17200 Nicotine dependence, unspecified, uncomplicated: Secondary | ICD-10-CM | POA: Diagnosis present

## 2012-06-17 DIAGNOSIS — R079 Chest pain, unspecified: Secondary | ICD-10-CM | POA: Diagnosis present

## 2012-06-17 DIAGNOSIS — R1013 Epigastric pain: Secondary | ICD-10-CM | POA: Insufficient documentation

## 2012-06-17 DIAGNOSIS — K120 Recurrent oral aphthae: Secondary | ICD-10-CM

## 2012-06-17 DIAGNOSIS — F1120 Opioid dependence, uncomplicated: Secondary | ICD-10-CM | POA: Diagnosis present

## 2012-06-17 HISTORY — DX: Chest pain, unspecified: R07.9

## 2012-06-17 HISTORY — DX: Prediabetes: R73.03

## 2012-06-17 HISTORY — DX: Rash and other nonspecific skin eruption: R21

## 2012-06-17 HISTORY — DX: Gastro-esophageal reflux disease without esophagitis: K21.9

## 2012-06-17 HISTORY — DX: Anemia, unspecified: D64.9

## 2012-06-17 LAB — BASIC METABOLIC PANEL
Calcium: 9.1 mg/dL (ref 8.4–10.5)
Chloride: 101 mEq/L (ref 96–112)
Creatinine, Ser: 1.06 mg/dL (ref 0.50–1.35)
GFR calc Af Amer: >90 mL/min (ref >90)
Sodium: 137 mEq/L (ref 135–145)

## 2012-06-17 LAB — TROPONIN I: Troponin I: <0.3 ng/mL (ref <0.30)

## 2012-06-17 LAB — CBC WITH DIFFERENTIAL/PLATELET
Basophils Absolute: 0 10*3/uL (ref 0.0–0.1)
Basophils Relative: 0 % (ref 0–1)
HCT: 38.2 % — ABNORMAL LOW (ref 39.0–52.0)
Lymphocytes Relative: 22 % (ref 12–46)
MCHC: 34.8 g/dL (ref 30.0–36.0)
Monocytes Absolute: 0.6 10*3/uL (ref 0.1–1.0)
Neutro Abs: 8.1 10*3/uL — ABNORMAL HIGH (ref 1.7–7.7)
Platelets: 298 10*3/uL (ref 150–400)
RDW: 12.9 % (ref 11.5–15.5)
WBC: 11.2 10*3/uL — ABNORMAL HIGH (ref 4.0–10.5)

## 2012-06-17 MED ORDER — BUSPIRONE HCL 5 MG PO TABS
10.0000 mg | ORAL_TABLET | Freq: Two times a day (BID) | ORAL | Status: DC
  Administered 2012-06-18 (×2): 10 mg via ORAL
  Filled 2012-06-17 (×2): qty 2

## 2012-06-17 MED ORDER — LEVALBUTEROL HCL 1.25 MG/0.5ML IN NEBU
1.2500 mg | INHALATION_SOLUTION | Freq: Once | RESPIRATORY_TRACT | Status: AC
  Administered 2012-06-17: 1.25 mg via RESPIRATORY_TRACT
  Filled 2012-06-17: qty 0.5

## 2012-06-17 MED ORDER — HYDROCHLOROTHIAZIDE 12.5 MG PO CAPS
12.5000 mg | ORAL_CAPSULE | Freq: Every day | ORAL | Status: DC
  Administered 2012-06-18: 12.5 mg via ORAL
  Filled 2012-06-17: qty 1

## 2012-06-17 MED ORDER — ALPRAZOLAM 1 MG PO TABS
1.0000 mg | ORAL_TABLET | Freq: Two times a day (BID) | ORAL | Status: DC
  Administered 2012-06-18 (×2): 1 mg via ORAL
  Filled 2012-06-17 (×2): qty 1

## 2012-06-17 MED ORDER — POTASSIUM CHLORIDE CRYS ER 20 MEQ PO TBCR
40.0000 meq | EXTENDED_RELEASE_TABLET | Freq: Once | ORAL | Status: AC
  Administered 2012-06-17: 40 meq via ORAL
  Filled 2012-06-17: qty 2

## 2012-06-17 MED ORDER — ALBUTEROL SULFATE (5 MG/ML) 0.5% IN NEBU: 2.5000 mg | INHALATION_SOLUTION | Freq: Four times a day (QID) | RESPIRATORY_TRACT | Status: DC | PRN

## 2012-06-17 MED ORDER — POTASSIUM CHLORIDE 10 MEQ/100ML IV SOLN
10.0000 meq | Freq: Once | INTRAVENOUS | Status: AC
  Administered 2012-06-17: 10 meq via INTRAVENOUS
  Filled 2012-06-17: qty 100

## 2012-06-17 MED ORDER — IPRATROPIUM BROMIDE 0.02 % IN SOLN: 0.5000 mg | Freq: Four times a day (QID) | RESPIRATORY_TRACT | Status: DC | PRN

## 2012-06-17 MED ORDER — POTASSIUM CHLORIDE IN NACL 20-0.9 MEQ/L-% IV SOLN
INTRAVENOUS | Status: DC
  Administered 2012-06-18: via INTRAVENOUS

## 2012-06-17 MED ORDER — AMITRIPTYLINE HCL 25 MG PO TABS
25.0000 mg | ORAL_TABLET | Freq: Every day | ORAL | Status: DC
  Administered 2012-06-18: 25 mg via ORAL
  Filled 2012-06-17: qty 1

## 2012-06-17 MED ORDER — METHYLPREDNISOLONE SODIUM SUCC 125 MG IJ SOLR
125.0000 mg | Freq: Once | INTRAMUSCULAR | Status: AC
  Administered 2012-06-17: 125 mg via INTRAVENOUS
  Filled 2012-06-17: qty 2

## 2012-06-17 MED ORDER — POTASSIUM CHLORIDE 10 MEQ/100ML IV SOLN
10.0000 meq | INTRAVENOUS | Status: AC
  Administered 2012-06-18 (×3): 10 meq via INTRAVENOUS
  Filled 2012-06-17: qty 100
  Filled 2012-06-17: qty 200

## 2012-06-17 MED ORDER — ENOXAPARIN SODIUM 40 MG/0.4ML ~~LOC~~ SOLN
40.0000 mg | SUBCUTANEOUS | Status: DC
  Administered 2012-06-18: 40 mg via SUBCUTANEOUS
  Filled 2012-06-17 (×2): qty 0.4

## 2012-06-17 MED ORDER — ONDANSETRON HCL 4 MG PO TABS: 4.0000 mg | ORAL_TABLET | Freq: Four times a day (QID) | ORAL | Status: DC | PRN

## 2012-06-17 MED ORDER — POTASSIUM CHLORIDE CRYS ER 20 MEQ PO TBCR
40.0000 meq | EXTENDED_RELEASE_TABLET | ORAL | Status: AC
  Administered 2012-06-18 (×3): 40 meq via ORAL
  Filled 2012-06-17 (×3): qty 2

## 2012-06-17 MED ORDER — ACETAMINOPHEN 650 MG RE SUPP: 650.0000 mg | Freq: Four times a day (QID) | RECTAL | Status: DC | PRN

## 2012-06-17 MED ORDER — ASPIRIN EC 81 MG PO TBEC
81.0000 mg | DELAYED_RELEASE_TABLET | Freq: Every morning | ORAL | Status: DC
  Administered 2012-06-18: 81 mg via ORAL
  Filled 2012-06-17: qty 1

## 2012-06-17 MED ORDER — MORPHINE SULFATE 2 MG/ML IJ SOLN
2.0000 mg | INTRAMUSCULAR | Status: DC | PRN
  Administered 2012-06-17 – 2012-06-18 (×6): 2 mg via INTRAVENOUS
  Filled 2012-06-17 (×6): qty 1

## 2012-06-17 MED ORDER — SODIUM CHLORIDE 0.9 % IJ SOLN
3.0000 mL | Freq: Two times a day (BID) | INTRAMUSCULAR | Status: DC
  Administered 2012-06-18 (×2): 3 mL via INTRAVENOUS

## 2012-06-17 MED ORDER — NICOTINE 21 MG/24HR TD PT24
21.0000 mg | MEDICATED_PATCH | Freq: Every day | TRANSDERMAL | Status: DC | PRN
  Administered 2012-06-18: 21 mg via TRANSDERMAL
  Filled 2012-06-17: qty 1

## 2012-06-17 MED ORDER — TRAZODONE HCL 50 MG PO TABS
50.0000 mg | ORAL_TABLET | Freq: Every evening | ORAL | Status: DC | PRN
  Administered 2012-06-18: 50 mg via ORAL
  Filled 2012-06-17: qty 1

## 2012-06-17 MED ORDER — ACETAMINOPHEN 325 MG PO TABS: 650.0000 mg | ORAL_TABLET | ORAL | Status: DC | PRN

## 2012-06-17 MED ORDER — ONDANSETRON HCL 4 MG/2ML IJ SOLN: 4.0000 mg | Freq: Four times a day (QID) | INTRAMUSCULAR | Status: DC | PRN

## 2012-06-17 MED ORDER — LORAZEPAM 2 MG/ML IJ SOLN
1.0000 mg | Freq: Once | INTRAMUSCULAR | Status: AC
  Administered 2012-06-17: 1 mg via INTRAVENOUS
  Filled 2012-06-17: qty 1

## 2012-06-17 MED ORDER — OXYCODONE HCL 5 MG PO TABS
5.0000 mg | ORAL_TABLET | ORAL | Status: DC | PRN
  Administered 2012-06-18: 5 mg via ORAL
  Filled 2012-06-17: qty 1

## 2012-06-17 MED ORDER — PANTOPRAZOLE SODIUM 40 MG PO TBEC
40.0000 mg | DELAYED_RELEASE_TABLET | Freq: Every day | ORAL | Status: DC
  Administered 2012-06-18: 40 mg via ORAL
  Filled 2012-06-17: qty 1

## 2012-06-17 NOTE — ED Provider Notes (Signed)
History     CSN: [id number]  Arrival date & time 06/17/12  1753   First MD Initiated Contact with Patient 06/17/12 1800      Chief Complaint  Patient presents with  . Chest Pain    (Consider location/radiation/quality/duration/timing/severity/associated sxs/prior treatment) Patient is a 46 y.o. male presenting with chest pain. The history is provided by the patient (pt complains of palpitations and some chest pain). No language interpreter was used.  Chest Pain The chest pain began 1 - 2 hours ago. Chest pain occurs intermittently. The chest pain is resolved. The pain is associated with exertion. At its most intense, the pain is at 3/10. The pain is currently at 0/10. The severity of the pain is moderate. The quality of the pain is described as aching. The pain does not radiate. Chest pain is worsened by exertion. Primary symptoms include shortness of breath and wheezing. Pertinent negatives for primary symptoms include no fatigue, no cough and no abdominal pain.  Pertinent negatives for past medical history include no seizures.     Past Medical History  Diagnosis Date  . Hypertension   . Arthritis   . Fibromyalgia   . COPD (chronic obstructive pulmonary disease)   . Anxiety   . Acid reflux     History reviewed. No pertinent past surgical history.  History reviewed. No pertinent family history.  History  Substance Use Topics  . Smoking status: Current Every Day Smoker -- 2.0 packs/day for 10 years  . Smokeless tobacco: Not on file  . Alcohol Use: No      Review of Systems  Constitutional: Negative for fatigue.  HENT: Negative for congestion, sinus pressure and ear discharge.   Eyes: Negative for discharge.  Respiratory: Positive for shortness of breath and wheezing. Negative for cough.   Cardiovascular: Positive for chest pain.  Gastrointestinal: Negative for abdominal pain and diarrhea.  Genitourinary: Negative for frequency and hematuria.  Musculoskeletal:  Negative for back pain.  Skin: Negative for rash.  Neurological: Negative for seizures and headaches.  Hematological: Negative.   Psychiatric/Behavioral: Negative for hallucinations.    Allergies  Gabapentin  Home Medications   Current Outpatient Rx  Name  Route  Sig  Dispense  Refill  . ALBUTEROL SULFATE HFA 108 (90 BASE) MCG/ACT IN AERS   Inhalation   Inhale 1-2 puffs into the lungs every 6 (six) hours as needed for wheezing.   1 Inhaler   0   . ALPRAZOLAM 1 MG PO TABS   Oral   Take 1 mg by mouth 2 (two) times daily.         Marland Kitchen AMITRIPTYLINE HCL 25 MG PO TABS   Oral   Take 1 tablet (25 mg total) by mouth at bedtime. For sleep and chronic pain.   30 tablet   0   . ASPIRIN 81 MG PO TBEC   Oral   Take 81 mg by mouth every morning. For blood thinner.         . BUSPIRONE HCL 10 MG PO TABS   Oral   Take 10 mg by mouth 2 (two) times daily.         Marland Kitchen HYDROCHLOROTHIAZIDE 12.5 MG PO CAPS   Oral   Take 1 capsule (12.5 mg total) by mouth daily. For blood pressure.   30 capsule   0   . OXYCODONE-ACETAMINOPHEN 5-325 MG PO TABS   Oral   Take 1 tablet by mouth every 4 (four) hours as needed. For pain         .  PANTOPRAZOLE SODIUM 20 MG PO TBEC   Oral   Take 20 mg by mouth daily as needed. For reflux.           BP 140/75  Pulse 125  Temp 99.2 F (37.3 C) (Oral)  Resp 18  Ht 5\' 9"  (1.753 m)  Wt 222 lb (100.699 kg)  BMI 32.78 kg/m2  SpO2 98%  Physical Exam  Constitutional: He is oriented to person, place, and time. He appears well-developed.  HENT:  Head: Normocephalic and atraumatic.  Eyes: Conjunctivae normal and EOM are normal. No scleral icterus.  Neck: Neck supple. No thyromegaly present.  Cardiovascular: Normal rate and regular rhythm.  Exam reveals no gallop and no friction rub.   No murmur heard. Pulmonary/Chest: No stridor. He has wheezes. He has no rales. He exhibits no tenderness.  Abdominal: He exhibits no distension. There is no  tenderness. There is no rebound.  Musculoskeletal: Normal range of motion. He exhibits no edema.  Lymphadenopathy:    He has no cervical adenopathy.  Neurological: He is oriented to person, place, and time. Coordination normal.  Skin: No rash noted. No erythema.  Psychiatric: He has a normal mood and affect. His behavior is normal.    ED Course  Procedures (including critical care time)  Labs Reviewed  CBC WITH DIFFERENTIAL - Abnormal; Notable for the following:    WBC 11.2 (*)     RBC 4.12 (*)     HCT 38.2 (*)     Neutro Abs 8.1 (*)     All other components within normal limits  BASIC METABOLIC PANEL - Abnormal; Notable for the following:    Potassium 2.5 (*)     Glucose, Bld 225 (*)     GFR calc non Af Amer 82 (*)     All other components within normal limits  TROPONIN I  D-DIMER, QUANTITATIVE   Dg Chest Portable 1 View  06/17/2012  *RADIOLOGY REPORT*  Clinical Data: Chest pain, shortness of breath, history of COPD and smoking  PORTABLE CHEST - 1 VIEW  Comparison: Portable chest x-ray of 04/08/2008  Findings: There is streaky opacity at the left lung base which could simply represent atelectasis.  Follow-up chest x-ray is recommended.  Otherwise the lungs are clear.  No effusion is seen. Mediastinal contours are stable.  The heart is within normal limits in size.  No bony abnormality is seen.  IMPRESSION: Streaky opacity at the left lung base most consistent with atelectasis.  Consider follow-up.   Original Report Authenticated By: Dwyane Dee, M.D.      1. COPD (chronic obstructive pulmonary disease)   2. Palpitations       Date: 06/17/2012  Rate:129  Rhythm: sinus tachycardia  QRS Axis: normal  Intervals: normal  ST/T Wave abnormalities: normal  Conduction Disutrbances:none  Narrative Interpretation:   Old EKG Reviewed: none available   MDM          Benny Lennert, MD 06/17/12 2104

## 2012-06-17 NOTE — ED Notes (Signed)
Pt has been out of bp meds x 4 days. Were called in today. Pt c/o L sided chest "tightness and pressure" that radiated into neck and shoulders per pt. C/o sob and dizziness with it. Rating cp 6  Now. Slight sob noted, pt breathing hard. Denies dizziness/sob now. Pt slightly pale. States felt like heart was beating fast until 02 was put on. lpt received one ntg sl, 4 baby asa and 02 en route. Pt feeling palpitations at this time

## 2012-06-17 NOTE — ED Notes (Signed)
Patient to be admitted; awaiting admission assessment.

## 2012-06-17 NOTE — ED Notes (Signed)
Patient resting comfortably in bed; family remains at bedside. 

## 2012-06-17 NOTE — ED Notes (Signed)
CRITICAL VALUE ALERT  Critical value received:  Potassium 2.5  Date of notification:  06/17/12 Time of notification:  1919  Critical value read back:yes  Nurse who received alert:  Thornton Dales, RN  MD notified (1st page):  [place]  Time of first page:  1920  MD notified (2nd page):  Time of second page:  Responding MD:  Estell Harpin  Time MD responded:  [phone]

## 2012-06-17 NOTE — H&P (Signed)
[facility] History and Physical  Mathew Brady  XBM:[id number]  DOB: [birth year]   DOA: 06/17/2012   PCP:  Mickle Plumb, NP, at [facility] in [place]  Chief Complaint:  Chest pressure and palpitations for about an hour this afternoon  HPI: Mathew Brady is an 46 y.o. male.    middle-aged Caucasian gentleman with a history COPD as well as  GERD,f anxiety and depression, fibromyalgia, polysubstance dependence including narcotics and tobacco Presents to the emergency room with a history of one or other central pressing chest discomfort, associated with clamminess, dizziness, palpitations and pounding in his ears, not relieved by aspirin administered by his wife, but relieved by sublingual nitroglycerin administered by EMS, intensity decreasing from 9/10-2/10. It was associated with nausea for which she took one of his wife's prescribed Phenergan.  Patient also suffers with COPD but has only been using it is inhaler once or twice per day.  On arrival to the emergency room patient was markedly tachycardic in the 130s, and was wheezing.  He received nebulizations with Xopenex in the emergency room and wheezing resolved but she remains extremely tachycardic and the hospitalist service was called to assist with management.  He continues to smoke 2 packs of cigarettes per day.    Rewiew of Systems:  All systems negative except as marked bold or noted in the HPI;  Constitutional: Negative for malaise, fever and chills. ;  Eyes: Negative for eye pain, redness and discharge. ;  ENMT: Negative for ear pain, hoarseness, nasal congestion, sinus pressure and sore throat. ;  Cardiovascular: Negative for chest pain, palpitations, diaphoresis, dyspnea ;  peripheral edema. ;  Gastrointestinal: Negative for nausea, vomiting, diarrhea, constipation, abdominal pain, melena, blood in stool, hematemesis, jaundice and rectal bleeding. unusual weight loss..  Genitourinary: Negative for  frequency, dysuria, incontinence,flank pain and hematuria;hezitancy  Musculoskeletal: Positive for back pain and neck pain. Negative for swelling and trauma.;  Skin: . Negative for pruritus, episodic bulous rash, abrasions, bruising and skin lesion.; ulcerations  Neuro: Negative for headache, lightheadedness and neck stiffness. Negative for weakness, altered level of consciousness , altered mental status, extremity weakness, burning feet, involuntary movement, seizure and syncope.  Psych: negative forinsomnia, tearfulness, panic attacks, hallucinations, paranoia, suicidal or homicidal ideation;  anxiety, depression,      Past Medical History  Diagnosis Date  . Hypertension   . Arthritis   . Fibromyalgia   . COPD (chronic obstructive pulmonary disease)   . Anxiety   . Acid reflux     Past Surgical History  Procedure Date  . Esophageal dilation 1993    Medications:  HOME MEDS: Prior to Admission medications   Medication Sig Start Date End Date Taking? Authorizing Provider  albuterol (PROVENTIL HFA;VENTOLIN HFA) 108 (90 BASE) MCG/ACT inhaler Inhale 1-2 puffs into the lungs every 6 (six) hours as needed for wheezing. 05/23/12  Yes Nicoletta Dress. Colon Branch, MD  ALPRAZolam Prudy Feeler) 1 MG tablet Take 1 mg by mouth 2 (two) times daily.   Yes Historical Provider, MD  amitriptyline (ELAVIL) 25 MG tablet Take 1 tablet (25 mg total) by mouth at bedtime. For sleep and chronic pain. 02/14/12 02/13/13 Yes Curlene Labrum Readling, MD  aspirin 81 MG EC tablet Take 81 mg by mouth every morning. For blood thinner. 02/14/12 02/13/13 Yes Curlene Labrum Readling, MD  busPIRone (BUSPAR) 10 MG tablet Take 10 mg by mouth 2 (two) times daily.   Yes Historical Provider, MD  hydrochlorothiazide (MICROZIDE) 12.5 MG capsule Take 1 capsule (  12.5 mg total) by mouth daily. For blood pressure. 02/14/12 02/13/13 Yes Curlene Labrum Readling, MD  oxyCODONE-acetaminophen (PERCOCET/ROXICET) 5-325 MG per tablet Take 1 tablet by mouth every 4 (four) hours as  needed. For pain   Yes Historical Provider, MD  pantoprazole (PROTONIX) 20 MG tablet Take 20 mg by mouth daily as needed. For reflux. 02/14/12 02/13/13 Yes Ronny Bacon, MD     Allergies:  Allergies  Allergen Reactions  . Gabapentin Other (See Comments)    Thrush in mouth    Social History:   reports that he has been smoking.  He does not have any smokeless tobacco history on file. He reports that he does not drink alcohol or use illicit drugs.  Family History: Family History  Problem Relation Age of Onset  . Cancer Father 71  . Benign prostatic hyperplasia Father   . Fibromyalgia Mother      Physical Exam: Filed Vitals:   06/17/12 1802 06/17/12 1940 06/17/12 2003 06/17/12 2200  BP: 118/69  140/75 119/64  Pulse: 135  125 111  Temp: 99.2 F (37.3 C)     TempSrc: Oral     Resp: 32  18 20  Height: 5\' 9"  (1.753 m)     Weight: 100.699 kg (222 lb)     SpO2: 92% 95% 98% 96%   Blood pressure 119/64, pulse 111, temperature 99.2 F (37.3 C), temperature source Oral, resp. rate 20, height 5\' 9"  (1.753 m), weight 100.699 kg (222 lb), SpO2 96.00%. ; cooperative with exam PSYCH:  alert and oriented x4; anxious ; affect is appropriate. HEENT: Mucous membranes pink and anicteric; PERRLA; EOM intact; no cervical lymphadenopathy nor thyromegaly or carotid bruit; no JVD; thick neck  Breasts:: Not examined CHEST WALL: No tenderness CHEST:  shallow  respiration, clear to auscultation bilaterally HEART: tachycardia, regular rhythm ; no murmurs rubs or gallops BACK: No kyphosis or scoliosis; no CVA tenderness ABDOMEN: Obese, soft non-tender; no masses, no organomegaly, normal abdominal bowel sounds;; no intertriginous candida. Rectal Exam: Not done EXTREMITIES: age-appropriate arthropathy of the hands and knees;  trace edema right leg ; no ulcerations. Genitalia: not examined PULSES: 2+ and symmetric SKIN: Normal hydration;  extensive irregular well marginated ulcerated area right  forearm, which she says began as a bullous lesion. Treated with medicated soap now scabbing over. Multiple hypopigmented areas on his skin which she says is the sequela of previous bullous lesions  CNS: Cranial nerves 2-12 grossly intact no focal lateralizing neurologic deficit   Labs on Admission:  Basic Metabolic Panel:  Lab 06/17/12 4782  NA 137  K 2.5*  CL 101  CO2 23  GLUCOSE 225*  BUN 11  CREATININE 1.06  CALCIUM 9.1  MG --  PHOS --   Liver Function Tests: No results found for this basename: AST:5,ALT:5,ALKPHOS:5,BILITOT:5,PROT:5,ALBUMIN:5 in the last 168 hours No results found for this basename: LIPASE:5,AMYLASE:5 in the last 168 hours No results found for this basename: AMMONIA:5 in the last 168 hours CBC:  Lab 06/17/12 1808  WBC 11.2*  NEUTROABS 8.1*  HGB 13.3  HCT 38.2*  MCV 92.7  PLT 298   Cardiac Enzymes:  Lab 06/17/12 1808  CKTOTAL --  CKMB --  CKMBINDEX --  TROPONINI <0.30   BNP: No components found with this basename: POCBNP:5 D-dimer: No components found with this basename: D-DIMER:5 CBG: No results found for this basename: GLUCAP:5 in the last 168 hours  Radiological Exams on Admission: Dg Chest Portable 1 View  06/17/2012  *RADIOLOGY REPORT*  Clinical Data: Chest pain, shortness of breath, history of COPD and smoking  PORTABLE CHEST - 1 VIEW  Comparison: Portable chest x-ray of 04/08/2008  Findings: There is streaky opacity at the left lung base which could simply represent atelectasis.  Follow-up chest x-ray is recommended.  Otherwise the lungs are clear.  No effusion is seen. Mediastinal contours are stable.  The heart is within normal limits in size.  No bony abnormality is seen.  IMPRESSION: Streaky opacity at the left lung base most consistent with atelectasis.  Consider follow-up.   Original Report Authenticated By: Dwyane Dee, M.D.     EKG: Independently reviewed.  sinus tachycardia; no ST segment    Assessment/Plan Present on  Admission:  . TOBACCO DEPENDENCE . FIBROMYALGIA, FIBROMYOSITIS . Anxiety . Benign hypertension . Hypokalemia . Opiate dependence . Benzodiazepine dependence . Chest pain . Palpitations   PLAN:  the patient has anginal symptoms since he has chest pain relieved with nitroglycerin, weighted by tachycardia and hypokalemia, so will vigorously replete potassium, will bring this gentleman in on observation to rule out acute coronary event.  Narcotic withdrawal is also a possible association considering chronic dependence, but he does seem to be taking Percocet regularly. We'll check a urine drug screen.  Nicotine cessation counseling and nicotine replacement as desired  Other plans as per orders.  Code Status:  requests DO NOT RESUSCITATE  Family Communication:  wife at bedside for discussion of plan Disposition Plan: likely home in the morning if acute coronary syndrome ruled out     Kennetha Pearman Nocturnist [facility] Pager [phone]   06/17/2012, 11:37 PM

## 2012-06-18 ENCOUNTER — Observation Stay (HOSPITAL_COMMUNITY): Payer: Self-pay

## 2012-06-18 ENCOUNTER — Encounter (HOSPITAL_COMMUNITY): Payer: Self-pay | Admitting: *Deleted

## 2012-06-18 DIAGNOSIS — R7303 Prediabetes: Secondary | ICD-10-CM | POA: Diagnosis present

## 2012-06-18 DIAGNOSIS — F17200 Nicotine dependence, unspecified, uncomplicated: Secondary | ICD-10-CM

## 2012-06-18 DIAGNOSIS — I10 Essential (primary) hypertension: Secondary | ICD-10-CM

## 2012-06-18 DIAGNOSIS — R21 Rash and other nonspecific skin eruption: Secondary | ICD-10-CM | POA: Diagnosis present

## 2012-06-18 DIAGNOSIS — R002 Palpitations: Secondary | ICD-10-CM

## 2012-06-18 DIAGNOSIS — D649 Anemia, unspecified: Secondary | ICD-10-CM | POA: Diagnosis present

## 2012-06-18 HISTORY — DX: Anemia, unspecified: D64.9

## 2012-06-18 LAB — COMPREHENSIVE METABOLIC PANEL
BUN: 10 mg/dL (ref 6–23)
Calcium: 9.3 mg/dL (ref 8.4–10.5)
Creatinine, Ser: 0.88 mg/dL (ref 0.50–1.35)
GFR calc Af Amer: >90 mL/min (ref >90)
Glucose, Bld: 183 mg/dL — ABNORMAL HIGH (ref 70–99)
Sodium: 137 mEq/L (ref 135–145)
Total Protein: 7 g/dL (ref 6.0–8.3)

## 2012-06-18 LAB — RAPID URINE DRUG SCREEN, HOSP PERFORMED
Amphetamines: NOT DETECTED
Barbiturates: NOT DETECTED
Benzodiazepines: POSITIVE — AB
Cocaine: NOT DETECTED
Opiates: POSITIVE — AB
Tetrahydrocannabinol: NOT DETECTED

## 2012-06-18 LAB — CBC
Hemoglobin: 12.7 g/dL — ABNORMAL LOW (ref 13.0–17.0)
MCH: 31.8 pg (ref 26.0–34.0)
MCHC: 33.9 g/dL (ref 30.0–36.0)

## 2012-06-18 LAB — TROPONIN I
Troponin I: <0.3 ng/mL (ref <0.30)
Troponin I: <0.3 ng/mL (ref <0.30)
Troponin I: <0.3 ng/mL (ref <0.30)

## 2012-06-18 LAB — MAGNESIUM: Magnesium: 1.8 mg/dL (ref 1.5–2.5)

## 2012-06-18 MED ORDER — OXYCODONE-ACETAMINOPHEN 5-325 MG PO TABS: 1.0000 | ORAL_TABLET | ORAL | Status: DC | PRN

## 2012-06-18 MED ORDER — PANTOPRAZOLE SODIUM 40 MG PO TBEC: 40.0000 mg | DELAYED_RELEASE_TABLET | Freq: Every day | ORAL | Status: DC | PRN

## 2012-06-18 MED ORDER — HYDROCORTISONE 1 % EX CREA: TOPICAL_CREAM | Freq: Two times a day (BID) | CUTANEOUS | Status: DC

## 2012-06-18 MED ORDER — METOPROLOL TARTRATE 12.5 MG HALF TABLET: 12.5000 mg | ORAL_TABLET | Freq: Two times a day (BID) | ORAL | Status: DC

## 2012-06-18 MED ORDER — HYDROCHLOROTHIAZIDE 12.5 MG PO CAPS: 12.5000 mg | ORAL_CAPSULE | Freq: Every day | ORAL | Status: DC

## 2012-06-18 MED ORDER — HYDROCORTISONE 1 % EX CREA
TOPICAL_CREAM | Freq: Two times a day (BID) | CUTANEOUS | Status: DC
  Administered 2012-06-18: 1 via TOPICAL
  Filled 2012-06-18: qty 6

## 2012-06-18 MED ORDER — POTASSIUM CHLORIDE ER 10 MEQ PO TBCR: 20.0000 meq | EXTENDED_RELEASE_TABLET | Freq: Every day | ORAL | Status: DC

## 2012-06-18 MED ORDER — METOPROLOL TARTRATE 25 MG PO TABS
12.5000 mg | ORAL_TABLET | Freq: Two times a day (BID) | ORAL | Status: DC
Start: 2012-06-18 — End: 2012-06-18
  Administered 2012-06-18: 12.5 mg via ORAL
  Filled 2012-06-18: qty 1

## 2012-06-18 NOTE — Progress Notes (Signed)
Patient has abrasive skin rash covering @ half of his left anterior arm. This abrasive looking rash is also noted on posterior right forearm and back of neck. Has the appearance of abrasion but patient states that this is a chronic condition. Please evaluate.

## 2012-06-18 NOTE — Care Management Note (Unsigned)
    Page 1 of 1   06/18/2012     2:15:18 PM   CARE MANAGEMENT NOTE 06/18/2012  Patient:  BASHIR, MARCHETTI   Account Number:  [id number]  Date Initiated:  06/18/2012  Documentation initiated by:  Rosemary Holms  Subjective/Objective Assessment:   Pt admitted with chest pain.     Action/Plan:   Anticipated DC Date:  06/18/2012   Anticipated DC Plan:  HOME/SELF CARE         Choice offered to / List presented to:             Status of service:  In process, will continue to follow Medicare Important Message given?   (If response is "NO", the following Medicare IM given date fields will be blank) Date Medicare IM given:   Date Additional Medicare IM given:    Discharge Disposition:    Per UR Regulation:    If discussed at Long Length of Stay Meetings, dates discussed:    Comments:  06/18/12 Rosemary Holms RN BSN CM

## 2012-06-18 NOTE — Progress Notes (Signed)
Patient with orders to be discharged. Discharge instructions and prescriptions given, patient verbalized understanding via teach back method. Patient in stable condition upon discharge. Patient left with father via private vehicle.

## 2012-06-18 NOTE — Progress Notes (Signed)
UR Chart Review Completed  

## 2012-06-18 NOTE — Discharge Summary (Signed)
Physician Discharge Summary  Mathew Brady WUJ:[id number] DOB: April 28, 1966 DOA: 06/17/2012  PCP: Mickle Plumb, NP at [facility] in [place], [place]  Admit date: 06/17/2012 Discharge date: 06/18/2012  Time spent: Greater than 30 minutes   Recommendations for Outpatient Follow-up:  1. The patient was advised to followup with his Healthcare provider next week. 2. Hemoglobin A1c pending at the time of discharge. 3. D-dimer mildly elevated, but the patient refused VQ scan.   Discharge Diagnoses:  1. Chest pain. 2. Associated palpitations with tachycardia. 3. Hypertension. 4. Hyperglycemia, possibly secondary to prediabetes. Hemoglobin A1c pending. 5. Epigastric abdominal pain. Lipase normal. Ultrasound of the abdomen unremarkable except for hepatic steatosis. 6. Opiate and benzodiazepine dependence. 7. Fibromyalgia/fibromyositis, chronic. 8. Tobacco abuse. 9. Chronic anxiety. 10. Hypokalemia. 11. GERD. 12. Rash, query eczema.  Discharge Condition: Improved and stable.  Diet recommendation: heart healthy.  Filed Weights   06/17/12 1802 06/17/12 2336  Weight: 100.699 kg (222 lb) 97.7 kg (215 lb 6.2 oz)    History of present illness:  The patient is a 46 year old man with a history significant for GERD, fibromyalgia, opiate and benzodiazepine dependence, and hypertension, who presented to the emergency department on 06/17/2012 with a chief complaint of chest pressure and palpitations. In the emergency department, he was tachycardic with a heart rate of 110-135 beats per minute. His EKG revealed sinus tachycardia with a heart rate of 125 beats per minute. No other acute changes. His chest x-ray revealed left lower lobe atelectasis. His lab data were significant for a serum potassium of 2.5, glucose of 225, normal troponin I. of less than 0.30, and WBC of 11.2. He was admitted for further evaluation and management.  Hospital Course:  Apparently, the patient was given supplemental  nitroglycerin in the emergency department and it relieved his chest pressure. There was a concern about possible opiate and benzodiazepine withdrawal, and therefore, urine drug screen was ordered. The urine drug screen was actually positive for both opiates and benzodiazepines. He was started on potassium chloride supplementation in the IV fluids and orally. His pain was treated with as needed morphine and as needed supplemental nitroglycerin. He was maintained on most of not all of his chronic medications. IV fluids were started for IV fluid hydration as it appeared that the patient may have been mildly volume depleted. He was given albuterol nebulizer for mild wheezing that was reported in the emergency department. The nicotine replacement therapy was started with a nicotine patch daily. The patient was strongly advised to stop smoking.  For further evaluation, a number of studies were ordered. His blood magnesium level was within normal limits at 1.8. His followup WBC normalized to 9.1. All of his cardiac enzymes were negative x4. His TSH was within normal limits at 2.083. Hemoglobin A1c was pending at the time of discharge, however, the followup fasting capillary blood glucose was 128. The patient may have prediabetes or full-blown diabetes. His fasting blood glucose will need to be assessed in a non-stressful environment to truly assess whether or not he has type 2 diabetes mellitus.  The patient later complained of epigastric abdominal pain and pleurisy. Therefore, an ultrasound of his abdomen and a d-dimer ordered. The ultrasound of his abdomen revealed hepatic steatosis, but otherwise unremarkable. His d-dimer was mildly elevated. Therefore, CT angiogram of his chest was ordered. The CT scan or was an operable, and therefore, a VQ scan was ordered. However, the patient refused the VQ scan as he was symptomatically improved and wanted to go home.  I advised him to stay for further evaluation, but he  refused. My clinical suspicion for PE was low, but it warranted further evaluation.  The patient did improve clinically and symptomatically. With supportive treatment, his tachycardia decreased, but did not completely resolve. His serum potassium normalized. Metoprolol was started both for blood pressure control and tachycardia following IV fluid hydration. The dose of Protonix was also increased from 20 mg to 40 mg daily. He was again advised to stop smoking prior to discharge.  Procedures:  None   Consultations:  None  Discharge Exam: Filed Vitals:   06/17/12 2200 06/17/12 2336 06/18/12 0500 06/18/12 1535  BP: 119/64 141/64 143/47 149/75  Pulse: 111 110 104 108  Temp:  98.6 F (37 C) 97.5 F (36.4 C) 98.2 F (36.8 C)  TempSrc:  Oral Oral Oral  Resp: 20 20 20 20   Height:  5\' 8"  (1.727 m)    Weight:  97.7 kg (215 lb 6.2 oz)    SpO2: 96% 98% 100% 99%    General: 46 year old Caucasian man laying in bed, in no acute distress. Cardiovascular: S1, S2, with borderline tachycardia. Respiratory: clear to auscultation bilaterally. Abdomen: Obese, positive bowel sounds, soft, minimally tender in the epigastrium subjectively. No rigidity, no distention. Extremities: Trace of pedal edema bilaterally.  Discharge Instructions  Discharge Orders    Future Orders Please Complete By Expires   Diet - low sodium heart healthy      Increase activity slowly      Discharge instructions      Comments:   Stop smoking. Metoprolol was added as her new blood pressure medicine. Continue to take hydrochlorothiazide. Take potassium supplement with hydrochlorothiazide. The dose of Protonix was increased to 40 mg daily. Followup with your primary care physician on a regular basis to avoid frequent hospitalizations.       Medication List     As of 06/18/2012  7:23 PM    TAKE these medications         albuterol 108 (90 BASE) MCG/ACT inhaler   Commonly known as: PROVENTIL HFA;VENTOLIN HFA    Inhale 1-2 puffs into the lungs every 6 (six) hours as needed for wheezing.      ALPRAZolam 1 MG tablet   Commonly known as: XANAX   Take 1 mg by mouth 2 (two) times daily.      amitriptyline 25 MG tablet   Commonly known as: ELAVIL   Take 1 tablet (25 mg total) by mouth at bedtime. For sleep and chronic pain.      aspirin 81 MG EC tablet   Take 81 mg by mouth every morning. For blood thinner.      busPIRone 10 MG tablet   Commonly known as: BUSPAR   Take 10 mg by mouth 2 (two) times daily.      hydrochlorothiazide 12.5 MG capsule   Commonly known as: MICROZIDE   Take 1 capsule (12.5 mg total) by mouth daily. For blood pressure.      hydrocortisone cream 1 %   Apply topically 2 (two) times daily.      metoprolol tartrate 12.5 mg Tabs   Commonly known as: LOPRESSOR   Take 0.5 tablets (12.5 mg total) by mouth 2 (two) times daily.      oxyCODONE-acetaminophen 5-325 MG per tablet   Commonly known as: PERCOCET/ROXICET   Take 1 tablet by mouth every 4 (four) hours as needed. For pain      pantoprazole 40 MG tablet   Commonly known  as: PROTONIX   Take 1 tablet (40 mg total) by mouth daily as needed. For reflux.      potassium chloride 10 MEQ tablet   Commonly known as: K-DUR   Take 2 tablets (20 mEq total) by mouth daily.           Follow-up Information    Please follow up. (Followup with your primary care physician next week.)           The results of significant diagnostics from this hospitalization (including imaging, microbiology, ancillary and laboratory) are listed below for reference.    Significant Diagnostic Studies: US Abdomen Complete  06/18/2012  *RADIOLOGY REPORT*  Clinical Data:  Mid abdominal pain.  Elevated liver function tests.  COMPLETE ABDOMINAL ULTRASOUND  Comparison:  CT dated 09/10/2003.  Findings:  Gallbladder:  No gallstones, gallbladder wall thickening, or pericholecystic fluid.  Common bile duct:  Borderline dilated, measuring 5.7 mm in  maximum diameter proximally.  Liver:  Diffusely echogenic.  IVC:  Appears normal.  Pancreas:  Partially obscured by overlying bowel gas.  The visualized portions appear normal.  Spleen:  Normal, measuring 4.9 cm in length.  Right Kidney:  Normal, measuring 12.9 cm in length.  Left Kidney:  Normal, measuring 11.9 cm in length.  Abdominal aorta:  Obscured by bowel gas distally.  The visualized portions are normal in caliber.  IMPRESSION:  1.  Echogenic liver, most likely due to steatosis. 2.  Borderline dilatation of the common duct.  This can be normal for this patient or due to a non-visualized, incompletely obstructing common duct stone or mass. 3.  Partially obscured pancreas and abdominal aorta.   Original Report Authenticated By: Beckie Salts, M.D.    Dg Chest Portable 1 View  06/17/2012  *RADIOLOGY REPORT*  Clinical Data: Chest pain, shortness of breath, history of COPD and smoking  PORTABLE CHEST - 1 VIEW  Comparison: Portable chest x-ray of 04/08/2008  Findings: There is streaky opacity at the left lung base which could simply represent atelectasis.  Follow-up chest x-ray is recommended.  Otherwise the lungs are clear.  No effusion is seen. Mediastinal contours are stable.  The heart is within normal limits in size.  No bony abnormality is seen.  IMPRESSION: Streaky opacity at the left lung base most consistent with atelectasis.  Consider follow-up.   Original Report Authenticated By: Dwyane Dee, M.D.     Microbiology: No results found for this or any previous visit (from the past 240 hour(s)).   Labs: Basic Metabolic Panel:  Lab 06/18/12 1610 06/17/12 2332 06/17/12 1808  NA 137 -- 137  K 4.5 -- 2.5*  CL 105 -- 101  CO2 19 -- 23  GLUCOSE 183* -- 225*  BUN 10 -- 11  CREATININE 0.88 -- 1.06  CALCIUM 9.3 -- 9.1  MG -- 1.8 --  PHOS -- -- --   Liver Function Tests:  Lab 06/18/12 0459  AST 19  ALT 24  ALKPHOS 70  BILITOT 0.2*  PROT 7.0  ALBUMIN 3.6    Lab 06/18/12 1028  LIPASE 17   AMYLASE --   No results found for this basename: AMMONIA:5 in the last 168 hours CBC:  Lab 06/18/12 0459 06/17/12 1808  WBC 9.1 11.2*  NEUTROABS -- 8.1*  HGB 12.7* 13.3  HCT 37.5* 38.2*  MCV 94.0 92.7  PLT 314 298   Cardiac Enzymes:  Lab 06/18/12 1018 06/18/12 0459 06/17/12 2332 06/17/12 1808  CKTOTAL -- -- -- --  CKMB -- -- -- --  CKMBINDEX -- -- -- --  TROPONINI <0.30 <0.30 <0.30 <0.30   BNP: BNP (last 3 results) No results found for this basename: PROBNP:3 in the last 8760 hours CBG:  Lab 06/18/12 1134  GLUCAP 128*       Signed:  Nicolaas Savo  [facility] 06/18/2012, 7:23 PM

## 2012-06-18 NOTE — Progress Notes (Signed)
Inpatient Diabetes Program Recommendations  AACE/ADA: New Consensus Statement on Inpatient Glycemic Control (2013)  Target Ranges:  Prepandial:   less than 140 mg/dL      Peak postprandial:   less than 180 mg/dL (1-2 hours)      Critically ill patients:  140 - 180 mg/dL   Results for HAYZEN, LORENSON (MRN [id number]) as of 06/18/2012 07:59  Ref. Range 06/17/2012 18:08 06/18/2012 04:59  Glucose Latest Range: 70-99 mg/dL 409 (H) 811 (H)    Inpatient Diabetes Program Recommendations Correction (SSI): May want to consider ordering CBGs ACHS with Novolog correction if necessary since patient is on steroids. HgbA1C: May want to order and A1C.  Last A1C was 5.5% on 01/27/2012.  Note: Patient does not have a documented history of diabetes.  Initial lab glucose on 06/17/2012 @ 18:08 was 225 mg/dl.  Patient was not given any steroids in the ED until 19:36.  Please consider ordering CBGs ACHS and Novolog correction if necessary while patient is on steroids.  Will continue to follow if necessary.  Thanks, Orlando Penner, RN, BSN, CCRN Diabetes Coordinator Inpatient Diabetes Program [phone]

## 2012-06-19 LAB — HEMOGLOBIN A1C: Hgb A1c MFr Bld: 5.7 % — ABNORMAL HIGH (ref <5.7)

## 2014-04-20 ENCOUNTER — Emergency Department (HOSPITAL_COMMUNITY): Payer: Self-pay

## 2014-04-20 ENCOUNTER — Encounter (HOSPITAL_COMMUNITY): Payer: Self-pay | Admitting: Emergency Medicine

## 2014-04-20 ENCOUNTER — Emergency Department (HOSPITAL_COMMUNITY)
Admission: EM | Admit: 2014-04-20 | Discharge: 2014-04-20 | Disposition: A | Discharge status: Home / Self Care | Payer: Self-pay | Attending: Emergency Medicine | Admitting: Emergency Medicine

## 2014-04-20 DIAGNOSIS — Z79899 Other long term (current) drug therapy: Secondary | ICD-10-CM | POA: Insufficient documentation

## 2014-04-20 DIAGNOSIS — Z7982 Long term (current) use of aspirin: Secondary | ICD-10-CM | POA: Insufficient documentation

## 2014-04-20 DIAGNOSIS — M949 Disorder of cartilage, unspecified: Secondary | ICD-10-CM | POA: Insufficient documentation

## 2014-04-20 DIAGNOSIS — Z862 Personal history of diseases of the blood and blood-forming organs and certain disorders involving the immune mechanism: Secondary | ICD-10-CM | POA: Insufficient documentation

## 2014-04-20 DIAGNOSIS — Z720 Tobacco use: Secondary | ICD-10-CM | POA: Insufficient documentation

## 2014-04-20 DIAGNOSIS — F419 Anxiety disorder, unspecified: Secondary | ICD-10-CM | POA: Insufficient documentation

## 2014-04-20 DIAGNOSIS — I10 Essential (primary) hypertension: Secondary | ICD-10-CM | POA: Insufficient documentation

## 2014-04-20 DIAGNOSIS — Z8719 Personal history of other diseases of the digestive system: Secondary | ICD-10-CM | POA: Insufficient documentation

## 2014-04-20 DIAGNOSIS — R109 Unspecified abdominal pain: Secondary | ICD-10-CM

## 2014-04-20 DIAGNOSIS — M1990 Unspecified osteoarthritis, unspecified site: Secondary | ICD-10-CM | POA: Insufficient documentation

## 2014-04-20 DIAGNOSIS — R110 Nausea: Secondary | ICD-10-CM | POA: Insufficient documentation

## 2014-04-20 DIAGNOSIS — R1012 Left upper quadrant pain: Secondary | ICD-10-CM | POA: Insufficient documentation

## 2014-04-20 DIAGNOSIS — G8929 Other chronic pain: Secondary | ICD-10-CM | POA: Insufficient documentation

## 2014-04-20 DIAGNOSIS — J441 Chronic obstructive pulmonary disease with (acute) exacerbation: Secondary | ICD-10-CM | POA: Insufficient documentation

## 2014-04-20 DIAGNOSIS — R197 Diarrhea, unspecified: Secondary | ICD-10-CM | POA: Insufficient documentation

## 2014-04-20 DIAGNOSIS — R0789 Other chest pain: Secondary | ICD-10-CM

## 2014-04-20 DIAGNOSIS — R1013 Epigastric pain: Secondary | ICD-10-CM | POA: Insufficient documentation

## 2014-04-20 HISTORY — DX: Palpitations: R00.2

## 2014-04-20 HISTORY — DX: Other long term (current) drug therapy: Z79.899

## 2014-04-20 HISTORY — DX: Unspecified abdominal pain: R10.9

## 2014-04-20 HISTORY — DX: Opioid use, unspecified, uncomplicated: F11.90

## 2014-04-20 HISTORY — DX: Other chronic pain: G89.29

## 2014-04-20 LAB — CBC
HCT: 46 % (ref 39.0–52.0)
HEMOGLOBIN: 15.9 g/dL (ref 13.0–17.0)
MCH: 33.5 pg (ref 26.0–34.0)
MCHC: 34.6 g/dL (ref 30.0–36.0)
MCV: 97 fL (ref 78.0–100.0)
PLATELETS: 265 10*3/uL (ref 150–400)
RBC: 4.74 MIL/uL (ref 4.22–5.81)
RDW: 13.2 % (ref 11.5–15.5)
WBC: 8.7 10*3/uL (ref 4.0–10.5)

## 2014-04-20 LAB — TROPONIN I

## 2014-04-20 LAB — URINALYSIS, ROUTINE W REFLEX MICROSCOPIC
Bilirubin Urine: NEGATIVE
GLUCOSE, UA: NEGATIVE mg/dL
HGB URINE DIPSTICK: NEGATIVE
Ketones, ur: NEGATIVE mg/dL
LEUKOCYTES UA: NEGATIVE
Nitrite: NEGATIVE
PH: 7 (ref 5.0–8.0)
PROTEIN: NEGATIVE mg/dL
Urobilinogen, UA: 0.2 mg/dL (ref 0.0–1.0)

## 2014-04-20 LAB — HEPATIC FUNCTION PANEL
ALBUMIN: 4.3 g/dL (ref 3.5–5.2)
ALT: 23 U/L (ref 0–53)
AST: 29 U/L (ref 0–37)
Alkaline Phosphatase: 101 U/L (ref 39–117)
TOTAL PROTEIN: 7.5 g/dL (ref 6.0–8.3)
Total Bilirubin: 0.3 mg/dL (ref 0.3–1.2)

## 2014-04-20 LAB — BASIC METABOLIC PANEL
ANION GAP: 13 (ref 5–15)
BUN: 8 mg/dL (ref 6–23)
CHLORIDE: 102 meq/L (ref 96–112)
CO2: 26 meq/L (ref 19–32)
Calcium: 9.4 mg/dL (ref 8.4–10.5)
Creatinine, Ser: 0.82 mg/dL (ref 0.50–1.35)
GFR calc Af Amer: >90 mL/min (ref >90)
GFR calc non Af Amer: >90 mL/min (ref >90)
GLUCOSE: 113 mg/dL — AB (ref 70–99)
POTASSIUM: 3.6 meq/L — AB (ref 3.7–5.3)
SODIUM: 141 meq/L (ref 137–147)

## 2014-04-20 LAB — LIPASE, BLOOD: Lipase: 24 U/L (ref 11–59)

## 2014-04-20 MED ORDER — MORPHINE SULFATE 4 MG/ML IJ SOLN: 4.0000 mg | Freq: Once | INTRAMUSCULAR | Status: DC

## 2014-04-20 MED ORDER — HYDROMORPHONE HCL 1 MG/ML IJ SOLN
1.0000 mg | Freq: Once | INTRAMUSCULAR | Status: AC
  Administered 2014-04-20: 1 mg via INTRAVENOUS
  Filled 2014-04-20: qty 1

## 2014-04-20 MED ORDER — ONDANSETRON HCL 4 MG/2ML IJ SOLN
4.0000 mg | Freq: Once | INTRAMUSCULAR | Status: AC
  Administered 2014-04-20: 4 mg via INTRAVENOUS
  Filled 2014-04-20: qty 2

## 2014-04-20 MED ORDER — IOHEXOL 300 MG/ML  SOLN
100.0000 mL | Freq: Once | INTRAMUSCULAR | Status: AC | PRN
  Administered 2014-04-20: 100 mL via INTRAVENOUS

## 2014-04-20 MED ORDER — KETOROLAC TROMETHAMINE 30 MG/ML IJ SOLN
30.0000 mg | Freq: Once | INTRAMUSCULAR | Status: AC
  Administered 2014-04-20: 30 mg via INTRAVENOUS
  Filled 2014-04-20: qty 1

## 2014-04-20 MED ORDER — IOHEXOL 300 MG/ML  SOLN
50.0000 mL | Freq: Once | INTRAMUSCULAR | Status: AC | PRN
  Administered 2014-04-20: 50 mL via ORAL

## 2014-04-20 MED ORDER — OXYCODONE-ACETAMINOPHEN 5-325 MG PO TABS: 1.0000 | ORAL_TABLET | ORAL | Status: DC | PRN

## 2014-04-20 MED ORDER — GI COCKTAIL ~~LOC~~
30.0000 mL | Freq: Once | ORAL | Status: AC
  Administered 2014-04-20: 30 mL via ORAL
  Filled 2014-04-20: qty 30

## 2014-04-20 NOTE — ED Provider Notes (Signed)
CSN: [id number]     Arrival date & time 04/20/14  1040 History   First MD Initiated Contact with Patient 04/20/14 1116     Chief Complaint  Patient presents with  . Chest Pain     (Consider location/radiation/quality/duration/timing/severity/associated sxs/prior Treatment) The history is provided by the patient.   Mathew Brady is a 48 y.o. male with a history of chronic abdominal pain, fibromyalgia and GERD presenting with a one week history of midsternal chest pain, upper abdominal pain, shortness of breath and nausea for the past week, but worse since last night.  He describes a sharp pain at the site of a "knot" which was much bigger last night at the base of his sternum which causes sharp pain that radiates to his back between his shoulder blades at rest but worse with palpation.  He denies cough, fever, chills, vomiting but does endorse diarrhea, stating he has had 3-4 episodes of non bloody watery stools since yesterday.  He endorses increased abdominal distention, normal passage of flatus.  He has a history  of chest pain and was admitted less than 2 years ago at which time cardiac source was ruled out.  He also has a history of chronic opioid and benzodiazepine use.     Past Medical History  Diagnosis Date  . Hypertension   . Arthritis   . Fibromyalgia   . COPD (chronic obstructive pulmonary disease)   . Anxiety   . Acid reflux   . Prediabetes 06/18/2012  . Anemia 06/18/2012  . Chest pain 06/17/2012    MI ruled out  . Rash 06/18/2012    Query eczema  . Chronic abdominal pain   . Opiate use   . Chronic prescription benzodiazepine use   . Palpitations    Past Surgical History  Procedure Laterality Date  . Esophageal dilation  1993   Family History  Problem Relation Age of Onset  . Cancer Father [id number]  . Benign prostatic hyperplasia Father   . Fibromyalgia Mother    History  Substance Use Topics  . Smoking status: Current Every Day Smoker -- 2.00 packs/day for 10  years    Types: Cigarettes  . Smokeless tobacco: Not on file  . Alcohol Use: No    Review of Systems  Constitutional: Negative for fever.  HENT: Negative for congestion and sore throat.   Eyes: Negative.   Respiratory: Positive for shortness of breath. Negative for chest tightness.   Cardiovascular: Positive for chest pain. Negative for palpitations and leg swelling.  Gastrointestinal: Positive for nausea, abdominal pain and diarrhea. Negative for vomiting and blood in stool.  Genitourinary: Negative.   Musculoskeletal: Negative for arthralgias, joint swelling and neck pain.  Skin: Negative.  Negative for rash and wound.  Neurological: Negative for dizziness, weakness, light-headedness, numbness and headaches.  Psychiatric/Behavioral: The patient is nervous/anxious.       Allergies  Gabapentin  Home Medications   Prior to Admission medications   Medication Sig Start Date End Date Taking? Authorizing Provider  alprazolam Prudy Feeler(XANAX) 2 MG tablet Take 2 mg by mouth 2 (two) times daily.   Yes Historical Provider, MD  aspirin EC 81 MG tablet Take 81 mg by mouth daily.   Yes Historical Provider, MD  gabapentin (NEURONTIN) 800 MG tablet Take 800 mg by mouth 3 (three) times daily.   Yes Historical Provider, MD  hydrochlorothiazide (HYDRODIURIL) 25 MG tablet Take 25 mg by mouth daily.   Yes Historical Provider, MD  metoprolol tartrate (LOPRESSOR) 25 MG  tablet Take 25 mg by mouth every evening.   Yes Historical Provider, MD  oxyCODONE-acetaminophen (PERCOCET) 10-325 MG per tablet Take 1 tablet by mouth every 8 (eight) hours as needed for pain.   Yes Historical Provider, MD  oxyCODONE-acetaminophen (PERCOCET/ROXICET) 5-325 MG per tablet Take 1 tablet by mouth every 4 (four) hours as needed. 04/20/14   Burgess Amor, PA-C   BP 122/85  Pulse 56  Temp(Src) 98.2 F (36.8 C) (Oral)  Resp 14  Ht 5\' 9"  (1.753 m)  Wt 188 lb (85.276 kg)  BMI 27.75 kg/m2  SpO2 99% Physical Exam  Nursing note and  vitals reviewed. Constitutional: He appears well-developed and well-nourished.  HENT:  Head: Normocephalic and atraumatic.  Eyes: Conjunctivae are normal.  Neck: Normal range of motion.  Cardiovascular: Normal rate, regular rhythm, normal heart sounds and intact distal pulses.   Pulmonary/Chest: Effort normal and breath sounds normal. He has no wheezes.    ttp over slightly prominent xiphoid.  Abdominal: Soft. Bowel sounds are normal. There is no hepatosplenomegaly. There is tenderness in the epigastric area and left upper quadrant. There is no guarding, no CVA tenderness and negative Murphy's sign.  Musculoskeletal: Normal range of motion.  Neurological: He is alert.  Skin: Skin is warm and dry.  Psychiatric: His mood appears anxious.    ED Course  Procedures (including critical care time) Labs Review Labs Reviewed  BASIC METABOLIC PANEL - Abnormal; Notable for the following:    Potassium 3.6 (*)    Glucose, Bld 113 (*)    All other components within normal limits  URINALYSIS, ROUTINE W REFLEX MICROSCOPIC - Abnormal; Notable for the following:    Specific Gravity, Urine <1.005 (*)    All other components within normal limits  CBC  TROPONIN I  HEPATIC FUNCTION PANEL  LIPASE, BLOOD    Imaging Review Ct Abdomen Pelvis W Contrast  04/20/2014   CLINICAL DATA:  Abdominal pain  EXAM: CT ABDOMEN AND PELVIS WITH CONTRAST  TECHNIQUE: Multidetector CT imaging of the abdomen and pelvis was performed using the standard protocol following bolus administration of intravenous contrast.  CONTRAST:  OMNIPAQUE IOHEXOL 300 MG/ML SOLN, 50mL OMNIPAQUE IOHEXOL 300 MG/ML SOLN  COMPARISON:  CT abdomen 09/10/2003  FINDINGS: Lung bases are clear.  Liver gallbladder and bile ducts are normal. Pancreas spleen and kidneys are normal. No renal mass or calculus. Negative for hydronephrosis. Urine bladder is normal.  Normal appearing bowel. No bowel obstruction or thickening. Normal appendix.  Negative  for mass or adenopathy. No free fluid. No acute skeletal abnormality.  IMPRESSION: Negative   Electronically Signed   By: Marlan Palau M.D.   On: 04/20/2014 14:02   Dg Chest Port 1 View  04/20/2014   CLINICAL DATA:  Mid chest pain, back pain. Lump just below sternum. Shortness of breath at times. Weight loss. Smoker with history of COPD and hypertension.  EXAM: PORTABLE CHEST - 1 VIEW  COMPARISON:  06/17/2012  FINDINGS: The heart size and mediastinal contours are within normal limits. Both lungs are clear. The visualized skeletal structures are unremarkable.  IMPRESSION: No active disease.   Electronically Signed   By: Charlett Nose M.D.   On: 04/20/2014 11:22     EKG Interpretation   Date/Time:  Wednesday April 20 2014 10:49:12 EDT Ventricular Rate:  76 PR Interval:  144 QRS Duration: 104 QT Interval:  404 QTC Calculation: 454 R Axis:   9 Text Interpretation:  Sinus rhythm Normal ECG When compared with ECG of  01/26/2012 No significant change was found Confirmed by [facility]  MD,  Nicholos JohnsKATHLEEN [phone]([id number]) on 04/20/2014 11:41:18 AM      MDM   Final diagnoses:  Xiphoidalgia  Chronic abdominal pain    Patients labs and/or radiological studies were viewed and considered during the medical decision making and disposition process. Wells criteria 0.0.  PE unlikely.    Prior to dc, pt reports he his last oxycodone tablet early this morning which did not adequately improve his pain.  He is currently out of his pain medications at this time and is concerned about getting refills as he is currently without a primary Dr.  He is on these medications for his chronic fibromyalgia and arthritis pain.  I suspect this was the main motivation for todays visit now that he revealed this information.   Labs unremarkable, ekg, cxr, Ct abd/pelvis negative for acute pathology. Doubt gallbladder issues given normal labs.   Pt was prescribed a small course of oxycodone and referrals given to establish primary care.     The patient appears reasonably screened and/or stabilized for discharge and I doubt any other medical condition or other [facility] requiring further screening, evaluation, or treatment in the ED at this time prior to discharge.     Burgess AmorJulie Virjean Boman, PA-C 04/20/14 1711

## 2014-04-20 NOTE — ED Notes (Signed)
Mid cp radiating to left chest with sob and nausea x 1 wk, worsening last night.

## 2014-04-20 NOTE — Discharge Instructions (Signed)
Abdominal Pain Many things can cause abdominal pain. Usually, abdominal pain is not caused by a disease and will improve without treatment. It can often be observed and treated at home. Your health care provider will do a physical exam and possibly order blood tests and X-rays to help determine the seriousness of your pain. However, in many cases, more time must pass before a clear cause of the pain can be found. Before that point, your health care provider may not know if you need more testing or further treatment. HOME CARE INSTRUCTIONS  Monitor your abdominal pain for any changes. The following actions may help to alleviate any discomfort you are experiencing:  Only take over-the-counter or prescription medicines as directed by your health care provider.  Do not take laxatives unless directed to do so by your health care provider.  Try a clear liquid diet (broth, tea, or water) as directed by your health care provider. Slowly move to a bland diet as tolerated. SEEK MEDICAL CARE IF:  You have unexplained abdominal pain.  You have abdominal pain associated with nausea or diarrhea.  You have pain when you urinate or have a bowel movement.  You experience abdominal pain that wakes you in the night.  You have abdominal pain that is worsened or improved by eating food.  You have abdominal pain that is worsened with eating fatty foods.  You have a fever. SEEK IMMEDIATE MEDICAL CARE IF:   Your pain does not go away within 2 hours.  You keep throwing up (vomiting).  Your pain is felt only in portions of the abdomen, such as the right side or the left lower portion of the abdomen.  You pass bloody or black tarry stools. MAKE SURE YOU:  Understand these instructions.   Will watch your condition.   Will get help right away if you are not doing well or get worse.  Document Released: 04/03/2005 Document Revised: 06/29/2013 Document Reviewed: 03/03/2013 [facility] Patient Information  2015 [place], [place]. This information is not intended to replace advice given to you by your health care provider. Make sure you discuss any questions you have with your health care provider.    Emergency Department Resource Guide 1) Find a Doctor and Pay Out of Pocket Although you won't have to find out who is covered by your insurance plan, it is a good idea to ask around and get recommendations. You will then need to call the office and see if the doctor you have chosen will accept you as a new patient and what types of options they offer for patients who are self-pay. Some doctors offer discounts or will set up payment plans for their patients who do not have insurance, but you will need to ask so you aren't surprised when you get to your appointment.  2) Contact Your Local Health Department Not all health departments have doctors that can see patients for sick visits, but many do, so it is worth a call to see if yours does. If you don't know where your local health department is, you can check in your phone book. The CDC also has a tool to help you locate your state's health department, and many state websites also have listings of all of their local health departments.  3) Find a [facility] If your illness is not likely to be very severe or complicated, you may want to try a walk in clinic. These are popping up all over the country in pharmacies, drugstores, and shopping centers.  They're usually staffed by nurse practitioners or physician assistants that have been trained to treat common illnesses and complaints. They're usually fairly quick and inexpensive. However, if you have serious medical issues or chronic medical problems, these are probably not your best option.  No Primary Care Doctor: - Call Health Connect at  [phone] - they can help you locate a primary care doctor that  accepts your insurance, provides certain services, etc. - Physician Referral Service-  [phone]  Chronic Pain Problems: Organization         Address  Phone   Notes  [facility]  [phone] Patients need to be referred by their primary care doctor.   Medication Assistance: Organization         Address  Phone   Notes  [facility] [address] [place], [place] [address] [phone] --Must be a resident of [facility] -- Must have NO insurance coverage whatsoever (no Medicaid/ Medicare, etc.) -- The pt. MUST have a primary care doctor that directs their care regularly and follows them in the community   [facility]ist  [phone]   [employer]  [phone]    Agencies that provide inexpensive medical care: Organization         Address  Phone   Notes  [place]  [phone]   [facility]    [phone]   [facility] [place], [place] [address] [phone]   [place] [address], [place] [phone]   Planned Parenthood    [phone]   [facility]    [phone]   [place] [address], [place] Phone:  [phone], Fax:  [phone] Hours of Operation:  9 am - 6 pm, M-F.  Also accepts Medicaid/Medicare and self-pay.  [facility] for [place] [address], [address], [place] Phone: [phone], Fax: [phone]. Hours of Operation:  8:30 am - 5:30 pm, M-F.  Also accepts Medicaid and self-pay.  [facility] [place] [address], [place] Phone: [phone]   [place], [place], [place] [phone], Ext. [phone] Mondays & Thursdays: 7-9 AM.  First 15 patients are seen on a first come, first serve basis.    [place] Providers:  Organization         Address  Phone   Notes  [facility] [address],  [place] [phone] Also accepts self-pay patients.  [facility] [id number] [place], [place]  [phone]   [place], [address], [place] [phone]   [facility] [address], [place] [phone]   Lucianne Lei [address], [place]   [phone] Only accepts [place] patients after they have their name applied to their card.   Self-Pay (no insurance) in [facility]:  Organization         Address  Phone   Notes  Sickle Cell Patients, [facility] [place] [phone]   [facility] [place] [phone]   [facility]  [place] [address], [place], [place] [phone]  [facility]/Dr. Osei-Bonsu ° [address] [place] [address], [place] or [address], [place] [phone] Phone number for both [place] and [place] locations is the same.  °[facility] [address], [place] [phone]   °[facility] [address] [place] [address], [place] or [address] ([phone]) [phone] °([phone]) [phone]   °[facility] [address], [place] ([phone]) [phone], phone; [phone], fax Sees patients 1st and 3rd Saturday of every month.  Must not qualify for public or private insurance (i.e. Medicaid, Medicare, [place] Health Choice, Veterans' Benefits) • Household income should be no more than 200% of the poverty level •The clinic cannot treat you if you are pregnant or think you are pregnant • Sexually transmitted diseases are not treated at the clinic.  ° ° °Dental Care: °Organization         Address  Phone  Notes  °[facility] [address], [place] ([phone]) [phone] Accepts children up to age 21 who are enrolled in  Medicaid or [place] Health Choice; pregnant women with a Medicaid card; and children who have applied for Medicaid or [place] Health Choice, but were declined, whose parents can pay a reduced fee at time of service.  °[facility] [place]  [address], [place] ([phone]) [phone] Accepts children up to age 21 who are enrolled in Medicaid or [place] Health Choice; pregnant women with a Medicaid card; and children who have applied for Medicaid or [place] Health Choice, but were declined, whose parents can pay a reduced fee at time of service.  °[facility] ° [address], [place] ([phone]) [phone] Patients are seen by appointment only. Walk-ins are not accepted. [facility] will see patients 18 years of age and older. °Monday - Tuesday (8am-5pm) °Most Wednesdays (8:30-5pm) °$30 per visit, cash only  °[facility] ° [address], [place] ([phone]) [phone] Patients are seen by appointment only. Walk-ins are not accepted. [facility] will see patients 18 years of age and older. °One Wednesday Evening (Monthly: Volunteer Based).  $30 per visit, cash only  °[facility]  ([phone] for adults; Children under age 4, call Graduate Pediatric Dentistry at [phone]. Children aged 4-14, please call [phone] to request a pediatric application. ° Dental services are provided in all areas of dental care including fillings, crowns and bridges, complete and partial dentures, implants, gum treatment, root canals, and extractions. Preventive care is also provided. Treatment is provided to both adults and children. °Patients are selected via a lottery and there is often a waiting list. °  °[facility] [address], °[place] ° ([phone]) [phone] [email or web address] °  °[facility] [address], [place], [place] (336)[phone], Ext. [phone] Second and Fourth Thursday of each month, opens at 6:30  AM; Clinic ends at 9 AM.  Patients are seen on a first-come first-served basis, and a limited number are seen during each clinic.  ° °[facility] ° [address], [place], [place] ([phone]) [phone]   Eligibility Requirements °You must have lived in [place], [place], or [place] counties for at least the last three months. °  You cannot be eligible for state or federal sponsored healthcare insurance, including Veterans Administration, Medicaid, or Medicare. °  You generally cannot be eligible for healthcare insurance   through your employer.  °  How to apply: °Eligibility screenings are held every Tuesday and Wednesday afternoon from 1:00 pm until 4:00 pm. You do not need an appointment for the interview!  °[facility] [address], [place], [place] 336-631-2330   °[facility]  336-342-8273   °[facility]  336-703-3100   °[facility]  336-570-6415   ° °Behavioral Health Resources in the Community: °Intensive Outpatient Programs °Organization         Address  Phone  Notes  °[facility] [address], [place], [place] 336-878-6098   °[facility] [address], [place], [place] 336-832-9800   °[facility] [address], [place], [place] ° 336-882-2125   °[facility] [address],  °[place], [place] 1-800-853-5163 or 336-641-4981   °Substance Abuse Resources °Organization         Address  Phone  Notes  °[facility]  336-882-2125   °[facility]  336-784-9470   °[facility]  336-285-9073   °[facility]  336-845-3988   °Residential & Outpatient Substance Abuse Program  1-800-659-3381   °Psychological Services °Organization         Address  Phone  Notes  °[facility]  336- 832-9600   °[facility]  336- 378-7881   °[facility] [address], [place] 1-800-853-5163 or  336-641-4981   ° °Mobile Crisis Teams °Organization         Address  Phone  Notes  °[facility], Mobile Crisis Care Unit  1-877-626-1772   °Assertive °[facility] ° [address] [place], [place] 336-834-9664   °Sharon DeEsch [address] °[place] 336-554-5454   ° °Self-Help/Support Groups °Organization         Address  Phone             Notes  °[facility] - variety of support groups  336- 373-1402 Call for more information  °Narcotics Anonymous (NA), [facility] [address], °[place]  2 meetings at this location  ° °Residential Treatment Programs °Organization         Address  Phone  Notes  °[facility] [address],    °[place]  1-866-801-8205   °[facility] ° [address], [place], [place] 704-293-8524   °[facility] [address], [place] 336-845-3988 Admissions: 8am-3pm M-F  °[facility] [address]    °[place], [place] 336-841-1104   °[facility] [address], [place], [place] 336-379-7146   °[facility] [address]  °[place], [place] 336-285-9073   °[facility] - Intensive Outpatient [address], [place], [place] 336-852-3033   °[facility] [address]  °[place], [place] 1-877-615-2722 or 336-784-9470   °[facility] ([facility]) [address] , [place] 336-227-7417 Accepts Medicaid  °[facility] [address]  °[place] 1-800-659-3381 Substance Abuse/Addiction Treatment  ° °[place] [facility] Health Resources °Organization         Address  Phone  Notes  °[facility]  (888) 581-9988   °Abran Gavigan Brannon, PhD [address] [place], [place]   (336) 349-5553 or (336) 951-0000   °[facility]   [address] °[place], [place] (336) 349-4454   °  [facility] [address],  [place], [place] ([phone](336) [phone] Insurance/Medicaid/sponsorship through [employer] [facility] [address]                                    [place], [place] [phone](336) [phone] Therapy/tele-psych/case  [facility] [address]   [place], [place] [phone](336) ([phone]    Dr. Lolly MustacheArfeen  [phone]) [phone](336) [phone]   [facility]  [employer] [facility] 1) [address], [place] 2) [address], [place] 3)  [address], [place] [phone](336) [phone](336) ([phone]  [phone](336) [phone]   [facility] (346)198-8431(336) 252 470 1933 or 225-756-7921(336) 256-804-2507 (After Hours)      You may take the oxycodone prescribed for pain relief.  This will make you drowsy - do not drive within 4 hours of taking this medication.

## 2014-04-22 NOTE — ED Provider Notes (Signed)
Medical screening examination/treatment/procedure(s) were performed by non-physician practitioner and as supervising physician I was immediately available for consultation/collaboration.   EKG Interpretation   Date/Time:  Wednesday April 20 2014 10:49:12 EDT Ventricular Rate:  76 PR Interval:  144 QRS Duration: 104 QT Interval:  404 QTC Calculation: 454 R Axis:   9 Text Interpretation:  Sinus rhythm Normal ECG When compared with ECG of  01/26/2012 No significant change was found Confirmed by [facility] [place] [facility]  MD,  Custer Pimenta ([id number]) on 04/20/2014 11:41:18 AM        Samuel JesterKathleen Meela Wareing, DO 04/22/14 1950

## 2014-05-14 ENCOUNTER — Encounter (HOSPITAL_COMMUNITY): Payer: Self-pay | Admitting: *Deleted

## 2014-05-14 ENCOUNTER — Emergency Department (HOSPITAL_COMMUNITY)
Admission: EM | Admit: 2014-05-14 | Discharge: 2014-05-14 | Disposition: A | Discharge status: Home / Self Care | Payer: Self-pay | Attending: Emergency Medicine | Admitting: Emergency Medicine

## 2014-05-14 DIAGNOSIS — Z7982 Long term (current) use of aspirin: Secondary | ICD-10-CM | POA: Insufficient documentation

## 2014-05-14 DIAGNOSIS — F419 Anxiety disorder, unspecified: Secondary | ICD-10-CM | POA: Insufficient documentation

## 2014-05-14 DIAGNOSIS — Y9389 Activity, other specified: Secondary | ICD-10-CM | POA: Insufficient documentation

## 2014-05-14 DIAGNOSIS — Z8719 Personal history of other diseases of the digestive system: Secondary | ICD-10-CM | POA: Insufficient documentation

## 2014-05-14 DIAGNOSIS — I10 Essential (primary) hypertension: Secondary | ICD-10-CM | POA: Insufficient documentation

## 2014-05-14 DIAGNOSIS — Y9289 Other specified places as the place of occurrence of the external cause: Secondary | ICD-10-CM | POA: Insufficient documentation

## 2014-05-14 DIAGNOSIS — S46211A Strain of muscle, fascia and tendon of other parts of biceps, right arm, initial encounter: Secondary | ICD-10-CM

## 2014-05-14 DIAGNOSIS — S41152A Open bite of left upper arm, initial encounter: Secondary | ICD-10-CM | POA: Insufficient documentation

## 2014-05-14 DIAGNOSIS — Z862 Personal history of diseases of the blood and blood-forming organs and certain disorders involving the immune mechanism: Secondary | ICD-10-CM | POA: Insufficient documentation

## 2014-05-14 DIAGNOSIS — Z720 Tobacco use: Secondary | ICD-10-CM | POA: Insufficient documentation

## 2014-05-14 DIAGNOSIS — W5511XA Bitten by horse, initial encounter: Secondary | ICD-10-CM | POA: Insufficient documentation

## 2014-05-14 DIAGNOSIS — M1990 Unspecified osteoarthritis, unspecified site: Secondary | ICD-10-CM | POA: Insufficient documentation

## 2014-05-14 DIAGNOSIS — Z79899 Other long term (current) drug therapy: Secondary | ICD-10-CM | POA: Insufficient documentation

## 2014-05-14 DIAGNOSIS — Z23 Encounter for immunization: Secondary | ICD-10-CM | POA: Insufficient documentation

## 2014-05-14 DIAGNOSIS — G8929 Other chronic pain: Secondary | ICD-10-CM | POA: Insufficient documentation

## 2014-05-14 DIAGNOSIS — J449 Chronic obstructive pulmonary disease, unspecified: Secondary | ICD-10-CM | POA: Insufficient documentation

## 2014-05-14 MED ORDER — TETANUS-DIPHTH-ACELL PERTUSSIS 5-2.5-18.5 LF-MCG/0.5 IM SUSP
0.5000 mL | Freq: Once | INTRAMUSCULAR | Status: AC
Start: 2014-05-14 — End: 2014-05-14
  Administered 2014-05-14: 0.5 mL via INTRAMUSCULAR
  Filled 2014-05-14: qty 0.5

## 2014-05-14 MED ORDER — OXYCODONE-ACETAMINOPHEN 10-325 MG PO TABS: 1.0000 | ORAL_TABLET | Freq: Three times a day (TID) | ORAL | Status: DC | PRN

## 2014-05-14 MED ORDER — METOPROLOL TARTRATE 25 MG PO TABS: 25.0000 mg | ORAL_TABLET | Freq: Every evening | ORAL | Status: DC

## 2014-05-14 MED ORDER — HYDROCHLOROTHIAZIDE 25 MG PO TABS: 25.0000 mg | ORAL_TABLET | Freq: Every day | ORAL | Status: DC

## 2014-05-14 NOTE — ED Notes (Signed)
Pt states a horse bit him on right arm last Friday. Scabbed areas present to right upper arm along with a large knot (golfball size). Pt states pain now radiates up to his right neck and has weakness to right arm and hand. Pt has not been seen by anyone since this occurred.

## 2014-05-14 NOTE — Discharge Instructions (Signed)
Biceps Tendon Disruption (Proximal) with Rehab The biceps tendon attaches the biceps muscle to the bones of the elbow and the shoulder. A proximal biceps tendon disruption is a tear of the long head of the tendon that attaches near the shoulder (more common) or a tear in the muscle near the muscle tendon junction (less common). These injuries usually involve a complete tear of the tendon from the bone. However, partial tears are also possible. The biceps muscle works with other muscles to bend the elbow and rotate the palm upward (supinate). A complete biceps rupture will result in about a 10% decrease in elbow bending strength and a 20% decrease in your ability to turn the palm upward, using the wrist. SYMPTOMS   Pain, tenderness, swelling, warmth, or redness over the front of the shoulder.  Pain that gets worse with shoulder and elbow use, especially against resistance (lifting or carrying).  Bruising (contusion) in the arm or elbow after 24 to 48 hours.  Bulge can be seen and felt in the arm.  Limited motion of the shoulder or elbow.  Weakness with attempted elbow bending or rotation of the wrist, such as when using a screwdriver.  Crackling sound (crepitation) when the tendon or shoulder is moved or touched. CAUSES  A biceps tendon rupture occurs when the tendon is subjected to a force that is greater than it can withstand. For example, a sudden force straightening the elbow while the biceps is flexed, or a direct hit (trauma) (rare). RISK INCREASES WITH:   Sports that involve contact, or throwing and overhead activities (racquet sports, gymnastics, weightlifting, bodybuilding).  Heavy labor.  Poor strength and flexibility.  Failure to warm up properly before activity. PREVENTION  Warm up and stretch properly before activity.  Maintain physical fitness:  Strength, flexibility, and endurance.  Cardiovascular fitness.  Allow your body to recover between practices and  competition.  Learn and use proper exercise technique. PROGNOSIS  If treated properly, the symptoms of a proximal biceps tendon disruption usually go away within 12 weeks of injury.  RELATED COMPLICATIONS  Longer healing time if not properly treated, or if not given enough time to heal.  Recurring symptoms, especially if activity is resumed too soon.  Weakness of elbow bending and forearm rotation.  Prolonged disability (uncommon). TREATMENT Treatment first involves the use of ice and medicine, to reduce pain and inflammation. It is also important to perform strengthening and stretching exercises and to modify activities that cause symptoms to get worse. These exercises may be performed at home or with a therapist. It is not possible to surgically fix the tendon (sew it together). However, surgery may be performed to reinsert the tendon into the arm bone. This will make the arm look normal again. Surgery is most often advised for younger, active individuals, especially those who require strength of wrist rotation.  MEDICATION  If pain medicine is needed, nonsteroidal anti-inflammatory medicines (aspirin and ibuprofen), or other minor pain relievers (acetaminophen), are often advised.  Do not take pain medicine for 7 days before surgery.  Prescription pain relievers may be given if your caregiver thinks they are needed. Use only as directed and only as much as you need.  Corticosteroid injections may be given to help reduce inflammation, but are not usually recommended for this injury. HEAT AND COLD  Cold treatment (icing) should be applied for 10 to 15 minutes every 2 to 3 hours for inflammation and pain, and immediately after activity that aggravates your symptoms. Use ice packs   or an ice massage.  Heat treatment may be used before performing stretching and strengthening activities prescribed by your caregiver, physical therapist, or athletic trainer. Use a heat pack or a warm water  soak. SEEK MEDICAL CARE IF:   Symptoms get worse or do not improve in 2 weeks, despite treatment.  New, unexplained symptoms develop. (Drugs used in treatment may produce side effects.) EXERCISES RANGE OF MOTION (ROM) AND STRETCHING EXERCISES - Biceps Tendon Disruption (Proximal) These exercises may help you when beginning to rehabilitate your injury. Your symptoms may go away with or without further involvement from your physician, physical therapist or athletic trainer. While completing these exercises, remember:   Restoring tissue flexibility helps normal motion to return to the joints. This allows healthier, less painful movement and activity.  An effective stretch should be held for at least 30 seconds.  A stretch should never be painful. You should only feel a gentle lengthening or release in the stretched tissue. STRETCH - Flexion, Standing  Stand with good posture. With an underhand grip on your right / left hand and an overhand grip on the opposite hand, grasp a broomstick or cane so that your hands are a little more than shoulder width apart.  Keeping your right / left elbow straight and shoulder muscles relaxed, push the stick with your opposite hand to raise your right / left arm in front of your body and then overhead. Raise your arm until you feel a stretch in your right / left shoulder, but before you have increased shoulder pain.  Try to avoid shrugging your right / left shoulder as your arm rises by keeping your shoulder blade tucked down and toward your mid-back spine. Hold __________ seconds.  Slowly return to the starting position. Repeat __________ times. Complete this exercise __________ times per day. STRETCH - Abduction, Supine  Stand with good posture. With an underhand grip on your right / left hand and an overhand grip on the opposite hand, grasp a broomstick or cane so that your hands are a little more than shoulder-width apart.  Keeping your right / left  elbow straight and shoulder muscles relaxed, push the stick with your opposite hand to raise your right / left arm out to the side of your body and then overhead. Raise your arm until you feel a stretch in your right / left shoulder, but before you have increased shoulder pain.  Try to avoid shrugging your right / left shoulder as your arm rises, by keeping your shoulder blade tucked down and toward your mid-back spine. Hold for __________ seconds.  Slowly return to the starting position. Repeat __________ times. Complete this exercise __________ times per day. ROM - Flexion, Active-Assisted  Lie on your back. You may bend your knees for comfort.  Grasp a broomstick or cane so your hands are about shoulder width apart. Your right / left hand should grip the end of the stick so that your hand is positioned "thumbs-up," as if you were about to shake hands.  Using your healthy arm to lead, raise your right / left arm overhead until you feel a gentle stretch in your shoulder. Hold for right / left seconds.  Use the stick to assist in returning your right / left arm to its starting position. Repeat __________ times. Complete this exercise __________ times per day.  STRETCH - Flexion, Standing   Stand facing a wall. Walk your right / left fingers up the wall until you feel a moderate stretch in your   shoulder. As your hand gets higher, you may need to step closer to the wall or use a door frame to walk through.  Try to avoid shrugging your right / left shoulder as your arm rises, by keeping your shoulder blade tucked down and toward your mid-back spine.  Hold for __________ seconds. Use your other hand, if needed, to ease out of the stretch and return to the starting position. Repeat __________ times. Complete this exercise __________ times per day.  ROM - Internal Rotation   Using underhand grips, grasp a stick behind your back with both hands.  While standing upright with good posture, slide  the stick up your back until you feel a mild stretch in the front of your shoulder.  Hold for __________ seconds. Slowly return to your starting position. Repeat __________ times. Complete this exercise __________ times per day.  STRETCH - Internal Rotation  Place your right / left hand behind your back, palm-up.  Throw a towel or belt over your opposite shoulder. Grasp the towel with your right / left hand.  While keeping an upright posture, gently pull up on the towel until you feel a stretch in the front of your right / left shoulder.  Avoid shrugging your right / left shoulder as your arm rises, by keeping your shoulder blade tucked down and toward your mid-back spine.  Hold for __________ seconds. Release the stretch by lowering your opposite hand. Repeat __________ times. Complete this exercise __________ times per day. STRETCH - Elbow Flexors   Lie on a firm bed or countertop on your back. Be sure that you are in a comfortable position which will allow you to relax your arm muscles.  Place a folded towel under your right / left upper arm, so that your elbow and shoulder are at the same height. Extend your arm; your elbow should not rest on the bed or towel.  Allow the weight of your hand to straighten your elbow. Keep your arm and chest muscles relaxed. Your caregiver may ask you to increase the intensity of your stretch by adding a small wrist or hand weight.  Hold for __________ seconds. You should feel a stretch on the inside of your elbow. Slowly return to the starting position. Repeat __________ times. Complete this exercise __________ times per day. STRENGTHENING EXERCISES - Biceps Tendon Disruption (Proximal) These exercises may help you regain your strength after your physician has discontinued your restraint in a cast or brace. They may resolve your symptoms with or without further involvement from your physician, physical therapist or athletic trainer. While completing  these exercises, remember:   Muscles can gain both the endurance and the strength needed for everyday activities through controlled exercises.  Complete these exercises as instructed by your physician, physical therapist or athletic trainer. Progress the resistance and repetitions only as guided.  You may experience muscle soreness or fatigue, but the pain or discomfort you are trying to eliminate should never worsen during these exercises. If this pain does get worse, stop and make sure you are following the directions exactly. If the pain is still present after adjustments, discontinue the exercise until you can discuss the trouble with your clinician. STRENGTH - Elbow Flexors, Isometric   Stand or sit upright on a firm surface. Place your right / left arm so that your hand is palm-up and at the height of your waist.  Place your opposite hand on top of your forearm. Gently push down as your right / left arm   resists. Push as hard as you can with both arms, without causing any pain or movement at your right / left elbow. Hold this stationary position for __________ seconds.  Gradually release the tension in both arms. Allow your muscles to relax completely before repeating. Repeat __________ times. Complete this exercise __________ times per day. STRENGTH - Shoulder Flexion, Isometric  With good posture and facing a wall, stand or sit about 4-6 inches away.  Keeping your right / left elbow straight, gently press the top of your fist into the wall. Increase the pressure gradually until you are pressing as hard as you can without shrugging your shoulder or increasing any shoulder discomfort.  Hold for __________ seconds.  Release the tension slowly. Relax your shoulder muscles completely before you the next repetition. Repeat __________ times. Complete this exercise __________ times per day.  STRENGTH - Elbow Flexors, Supinated  With good posture, stand or sit on a firm chair without  armrests. Allow your right / left arm to rest at your side with your palm facing forward.  Holding a __________weight or gripping a rubber exercise band or tubing, bring your hand toward your shoulder.  Allow your muscles to control the resistance as your hand returns to your side. Repeat __________ times. Complete this exercise __________ times per day.  STRENGTH - Elbow Flexors, Neutral  With good posture, stand or sit on a firm chair without armrests. Allow your right / left arm to rest at your side with your thumb facing forward.  Holding a __________ weight or gripping a rubber exercise band or tubing, bring your hand toward your shoulder.  Allow your muscles to control the resistance as your hand returns to your side. Repeat __________ times. Complete this exercise __________ times per day.  STRENGTH - Shoulder Flexion   Stand or sit with good posture. Grasp a __________ weight, or an exercise band or tubing, so that your hand is "thumbs-up," like when you shake hands.  Slowly lift your right / left arm as far as you can without increasing any shoulder pain. Initially, many people can only raise their hand to shoulder height.  Avoid shrugging your right / left shoulder as your arm rises, by keeping your shoulder blade tucked down and toward your mid-back spine.  Hold for __________ seconds. Control the descent of your hand as you slowly return to your starting position. Repeat __________ times. Complete this exercise __________ times per day.  STRENGTH - Forearm Supinators   Sit with your right / left forearm supported on a table, keeping your elbow below shoulder height. Rest your hand over the edge, palm down.  Gently grip a hammer or a soup ladle.  Without moving your elbow, slowly turn your palm and hand upward to a "thumbs-up" position.  Hold this position for __________ seconds. Slowly return to the starting position. Repeat __________ times. Complete this exercise  __________ times per day.  STRENGTH - Forearm Pronators   Sit with your right / left forearm supported on a table, keeping your elbow below shoulder height. Rest your hand over the edge, palm up.  Gently grip a hammer or a soup ladle.  Without moving your elbow, slowly turn your palm and hand upward to a "thumbs-up" position.  Hold this position for __________ seconds. Slowly return to the starting position. Repeat __________ times. Complete this exercise __________ times per day.  Document Released: 06/24/2005 Document Revised: 09/16/2011 Document Reviewed: 10/06/2008 [employer] Patient Information 2015 [employer]. This information is not   intended to replace advice given to you by your health care provider. Make sure you discuss any questions you have with your health care provider.  

## 2014-05-14 NOTE — ED Provider Notes (Signed)
CSN: [id number]     Arrival date & time 05/14/14  1548 History   First MD Initiated Contact with Patient 05/14/14 1603     Chief Complaint  Patient presents with  . Arm Injury     (Consider location/radiation/quality/duration/timing/severity/associated sxs/prior Treatment) HPI Comments: Patient presents to the ER for evaluation of right arm injury. Patient reports that he was bitten by his horse last week. Patient complains of a large knot on the upper arm and constant pain that significantly worsens when he moves the arm. There is no redness, drainage from the area.  Patient is a 48 y.o. male presenting with arm injury.  Arm Injury   Past Medical History  Diagnosis Date  . Hypertension   . Arthritis   . Fibromyalgia   . COPD (chronic obstructive pulmonary disease)   . Anxiety   . Acid reflux   . Prediabetes 06/18/2012  . Anemia 06/18/2012  . Chest pain 06/17/2012    MI ruled out  . Rash 06/18/2012    Query eczema  . Chronic abdominal pain   . Opiate use   . Chronic prescription benzodiazepine use   . Palpitations    Past Surgical History  Procedure Laterality Date  . Esophageal dilation  1993   Family History  Problem Relation Age of Onset  . Cancer Father [id number]  . Benign prostatic hyperplasia Father   . Fibromyalgia Mother    History  Substance Use Topics  . Smoking status: Current Every Day Smoker -- 2.00 packs/day for 10 years    Types: Cigarettes  . Smokeless tobacco: Not on file  . Alcohol Use: No    Review of Systems  Musculoskeletal:       Arm pain  Skin: Positive for wound.  All other systems reviewed and are negative.     Allergies  Gabapentin  Home Medications   Prior to Admission medications   Medication Sig Start Date End Date Taking? Authorizing Provider  alprazolam Prudy Feeler(XANAX) 2 MG tablet Take 2 mg by mouth 2 (two) times daily.    Historical Provider, MD  aspirin EC 81 MG tablet Take 81 mg by mouth daily.    Historical Provider, MD    gabapentin (NEURONTIN) 800 MG tablet Take 800 mg by mouth 3 (three) times daily.    Historical Provider, MD  hydrochlorothiazide (HYDRODIURIL) 25 MG tablet Take 25 mg by mouth daily.    Historical Provider, MD  metoprolol tartrate (LOPRESSOR) 25 MG tablet Take 25 mg by mouth every evening.    Historical Provider, MD  oxyCODONE-acetaminophen (PERCOCET) 10-325 MG per tablet Take 1 tablet by mouth every 8 (eight) hours as needed for pain.    Historical Provider, MD  oxyCODONE-acetaminophen (PERCOCET/ROXICET) 5-325 MG per tablet Take 1 tablet by mouth every 4 (four) hours as needed. 04/20/14   Burgess AmorJulie Idol, PA-C   BP 140/72 mmHg  Pulse 88  Temp(Src) 98 F (36.7 C) (Oral)  Resp 16  Ht 5\' 9"  (1.753 m)  Wt 188 lb (85.276 kg)  BMI 27.75 kg/m2  SpO2 100% Physical Exam  Constitutional: He is oriented to person, place, and time. He appears well-developed and well-nourished. No distress.  HENT:  Head: Normocephalic and atraumatic.  Right Ear: Hearing normal.  Left Ear: Hearing normal.  Nose: Nose normal.  Mouth/Throat: Oropharynx is clear and moist and mucous membranes are normal.  Eyes: Conjunctivae and EOM are normal. Pupils are equal, round, and reactive to light.  Neck: Normal range of motion. Neck supple.  Cardiovascular:  Regular rhythm, S1 normal and S2 normal.  Exam reveals no gallop and no friction rub.   No murmur heard. Pulmonary/Chest: Effort normal and breath sounds normal. No respiratory distress. He exhibits no tenderness.  Abdominal: Soft. Normal appearance and bowel sounds are normal. There is no hepatosplenomegaly. There is no tenderness. There is no rebound, no guarding, no tenderness at McBurney's point and negative Murphy's sign. No hernia.  Musculoskeletal: Normal range of motion.       Left upper arm: He exhibits tenderness and deformity (proximal lateral muscle belly deformity).  Neurological: He is alert and oriented to person, place, and time. He has normal strength. No  cranial nerve deficit or sensory deficit. Coordination normal. GCS eye subscore is 4. GCS verbal subscore is 5. GCS motor subscore is 6.  Skin: Skin is warm, dry and intact. No rash noted. No cyanosis.     Psychiatric: He has a normal mood and affect. His speech is normal and behavior is normal. Thought content normal.  Nursing note and vitals reviewed.   ED Course  Procedures (including critical care time) Labs Review Labs Reviewed - No data to display  Imaging Review No results found.   EKG Interpretation None      MDM   Final diagnoses:  None  horse bite Biceps injury  Patient presents to the ER for evaluation of injury to the right upper arm. Patient reports that he was bitten by a horse last week. There are 2 wounds on the upper arm that appear to be healing well without any sign of infection. There is, however, evidence of what appears to be at least partial tear of the lateral aspect of the bicep muscle bellywith defect. Patient has normal flexion, extension at the elbow, normal supination and pronation of the forearm.    Gilda Creasehristopher J. Rayson Rando, MD 05/14/14 [id number]

## 2014-05-17 ENCOUNTER — Encounter (HOSPITAL_COMMUNITY): Payer: Self-pay | Admitting: *Deleted

## 2014-05-17 ENCOUNTER — Emergency Department (HOSPITAL_COMMUNITY)
Admission: EM | Admit: 2014-05-17 | Discharge: 2014-05-17 | Disposition: A | Discharge status: Home / Self Care | Payer: Self-pay | Attending: Emergency Medicine | Admitting: Emergency Medicine

## 2014-05-17 DIAGNOSIS — I10 Essential (primary) hypertension: Secondary | ICD-10-CM | POA: Insufficient documentation

## 2014-05-17 DIAGNOSIS — M79601 Pain in right arm: Secondary | ICD-10-CM | POA: Insufficient documentation

## 2014-05-17 DIAGNOSIS — J449 Chronic obstructive pulmonary disease, unspecified: Secondary | ICD-10-CM | POA: Insufficient documentation

## 2014-05-17 DIAGNOSIS — Z7982 Long term (current) use of aspirin: Secondary | ICD-10-CM | POA: Insufficient documentation

## 2014-05-17 DIAGNOSIS — Z79899 Other long term (current) drug therapy: Secondary | ICD-10-CM | POA: Insufficient documentation

## 2014-05-17 DIAGNOSIS — Z862 Personal history of diseases of the blood and blood-forming organs and certain disorders involving the immune mechanism: Secondary | ICD-10-CM | POA: Insufficient documentation

## 2014-05-17 DIAGNOSIS — M1990 Unspecified osteoarthritis, unspecified site: Secondary | ICD-10-CM | POA: Insufficient documentation

## 2014-05-17 DIAGNOSIS — Z8719 Personal history of other diseases of the digestive system: Secondary | ICD-10-CM | POA: Insufficient documentation

## 2014-05-17 DIAGNOSIS — G8929 Other chronic pain: Secondary | ICD-10-CM | POA: Insufficient documentation

## 2014-05-17 DIAGNOSIS — Z720 Tobacco use: Secondary | ICD-10-CM | POA: Insufficient documentation

## 2014-05-17 DIAGNOSIS — M79621 Pain in right upper arm: Secondary | ICD-10-CM

## 2014-05-17 MED ORDER — DICLOFENAC SODIUM 75 MG PO TBEC: 75.0000 mg | DELAYED_RELEASE_TABLET | Freq: Two times a day (BID) | ORAL | Status: DC

## 2014-05-17 MED ORDER — HYDROCODONE-ACETAMINOPHEN 5-325 MG PO TABS: 1.0000 | ORAL_TABLET | ORAL | Status: DC | PRN

## 2014-05-17 NOTE — Discharge Instructions (Signed)
Please see the orthopedic consultant you were referred to or one of the orthopedic clinic for follow up of your arm injury. Please see MD at [facility] for assistance with obtaining a primary MD. Musculoskeletal Pain Musculoskeletal pain is muscle and boney aches and pains. These pains can occur in any part of the body. Your caregiver may treat you without knowing the cause of the pain. They may treat you if blood or urine tests, X-rays, and other tests were normal.  CAUSES There is often not a definite cause or reason for these pains. These pains may be caused by a type of germ (virus). The discomfort may also come from overuse. Overuse includes working out too hard when your body is not fit. Boney aches also come from weather changes. Bone is sensitive to atmospheric pressure changes. HOME CARE INSTRUCTIONS   Ask when your test results will be ready. Make sure you get your test results.  Only take over-the-counter or prescription medicines for pain, discomfort, or fever as directed by your caregiver. If you were given medications for your condition, do not drive, operate machinery or power tools, or sign legal documents for 24 hours. Do not drink alcohol. Do not take sleeping pills or other medications that may interfere with treatment.  Continue all activities unless the activities cause more pain. When the pain lessens, slowly resume normal activities. Gradually increase the intensity and duration of the activities or exercise.  During periods of severe pain, bed rest may be helpful. Lay or sit in any position that is comfortable.  Putting ice on the injured area.  Put ice in a bag.  Place a towel between your skin and the bag.  Leave the ice on for 15 to 20 minutes, 3 to 4 times a day.  Follow up with your caregiver for continued problems and no reason can be found for the pain. If the pain becomes worse or does not go away, it may be necessary to repeat tests or do  additional testing. Your caregiver may need to look further for a possible cause. SEEK IMMEDIATE MEDICAL CARE IF:  You have pain that is getting worse and is not relieved by medications.  You develop chest pain that is associated with shortness or breath, sweating, feeling sick to your stomach (nauseous), or throw up (vomit).  Your pain becomes localized to the abdomen.  You develop any new symptoms that seem different or that concern you. MAKE SURE YOU:   Understand these instructions.  Will watch your condition.  Will get help right away if you are not doing well or get worse. Document Released: 06/24/2005 Document Revised: 09/16/2011 Document Reviewed: 02/26/2013 [facility] Patient Information 2015 [place], [place]. This information is not intended to replace advice given to you by your health care provider. Make sure you discuss any questions you have with your health care provider.

## 2014-05-17 NOTE — ED Notes (Signed)
Bitten by horse , seen here 11/7  Referred to ortho, but could not afford to go there.  Out of pain meds  Says muscle is torn

## 2014-05-17 NOTE — ED Provider Notes (Signed)
CSN: [id number]     Arrival date & time 05/17/14  1534 History   First MD Initiated Contact with Patient 05/17/14 1610     Chief Complaint  Patient presents with  . Arm Pain     (Consider location/radiation/quality/duration/timing/severity/associated sxs/prior Treatment) Patient is a 48 y.o. male presenting with arm pain. The history is provided by the patient.  Arm Pain The current episode started 1 to 4 weeks ago. The problem occurs intermittently. The problem has been unchanged. Pertinent negatives include no chills, fever, nausea, numbness or vomiting. Exacerbated by: flexing and extention of the right arm.    Past Medical History  Diagnosis Date  . Hypertension   . Arthritis   . Fibromyalgia   . COPD (chronic obstructive pulmonary disease)   . Anxiety   . Acid reflux   . Prediabetes 06/18/2012  . Anemia 06/18/2012  . Chest pain 06/17/2012    MI ruled out  . Rash 06/18/2012    Query eczema  . Chronic abdominal pain   . Opiate use   . Chronic prescription benzodiazepine use   . Palpitations    Past Surgical History  Procedure Laterality Date  . Esophageal dilation  1993   Family History  Problem Relation Age of Onset  . Cancer Father [id number]  . Benign prostatic hyperplasia Father   . Fibromyalgia Mother    History  Substance Use Topics  . Smoking status: Current Every Day Smoker -- 2.00 packs/day for 10 years    Types: Cigarettes  . Smokeless tobacco: Not on file  . Alcohol Use: No    Review of Systems  Constitutional: Negative for fever and chills.  Gastrointestinal: Negative for nausea and vomiting.  Neurological: Negative for numbness.      Allergies  Gabapentin  Home Medications   Prior to Admission medications   Medication Sig Start Date End Date Taking? Authorizing Provider  alprazolam Prudy Feeler(XANAX) 2 MG tablet Take 2 mg by mouth 2 (two) times daily.    Historical Provider, MD  aspirin EC 81 MG tablet Take 81 mg by mouth daily.    Historical  Provider, MD  gabapentin (NEURONTIN) 800 MG tablet Take 800 mg by mouth 3 (three) times daily.    Historical Provider, MD  hydrochlorothiazide (HYDRODIURIL) 25 MG tablet Take 1 tablet (25 mg total) by mouth daily. 05/14/14   Gilda Creasehristopher J. Pollina, MD  metoprolol tartrate (LOPRESSOR) 25 MG tablet Take 1 tablet (25 mg total) by mouth every evening. 05/14/14   Gilda Creasehristopher J. Pollina, MD  oxyCODONE-acetaminophen (PERCOCET) 10-325 MG per tablet Take 1 tablet by mouth every 8 (eight) hours as needed for pain. 05/14/14   Gilda Creasehristopher J. Pollina, MD   BP 154/126 mmHg  Pulse 89  Temp(Src) 98.8 F (37.1 C) (Oral)  Resp 18  Ht 5\' 9"  (1.753 m)  Wt 180 lb (81.647 kg)  BMI 26.57 kg/m2  SpO2 99% Physical Exam  Constitutional: He is oriented to person, place, and time. He appears well-developed and well-nourished.  Non-toxic appearance.  HENT:  Head: Normocephalic.  Right Ear: Tympanic membrane and external ear normal.  Left Ear: Tympanic membrane and external ear normal.  Eyes: EOM and lids are normal. Pupils are equal, round, and reactive to light.  Neck: Normal range of motion. Neck supple. Carotid bruit is not present.  Cardiovascular: Normal rate, regular rhythm, normal heart sounds, intact distal pulses and normal pulses.   Pulmonary/Chest: Breath sounds normal. No respiratory distress.  Abdominal: Soft. Bowel sounds are normal. There is no  tenderness. There is no guarding.  Musculoskeletal: Normal range of motion.  There is FROM of the fingers, wrist,and shoulder of the right upper extremity. There  Are 2 well healed wounds of the upper right bicep/tricep area. There is a raised deformity of the bicep area. No hot areas. No red streaks.  There is good flex/Extention of the elbow. Cap refill is less than 2 sec.Marland Kitchen.  Lymphadenopathy:       Head (right side): No submandibular adenopathy present.       Head (left side): No submandibular adenopathy present.    He has no cervical adenopathy.   Neurological: He is alert and oriented to person, place, and time. He has normal strength. No cranial nerve deficit or sensory deficit.  Skin: Skin is warm and dry.  Psychiatric: He has a normal mood and affect. His speech is normal.  Nursing note and vitals reviewed.   ED Course  Procedures (including critical care time) Labs Review Labs Reviewed - No data to display  Imaging Review No results found.   EKG Interpretation None      MDM  Pt was referred to orthopedics for possible partial bicep tear following a bite by a horse nearly 3 weeks ago. Pt c/o continues pain. He states he is not financially able to see the orthopedic MD and he does not have a PCP. No neurovascular deficit on todays exam. Suggested to patient to [facility], and to call [facility] to obtain a PCP to assist him. Rx for diclofenac and norco given to the patient.   Final diagnoses:  Pain of right upper arm    *I have reviewed nursing notes, vital signs, and all appropriate lab and imaging results for this patient.[id number]**    Mathew Brady M Maizey Menendez, PA-C 05/17/14 1646  Mathew RollerBrian D Miller, MD 05/18/14 1600

## 2015-02-09 ENCOUNTER — Telehealth: Payer: Self-pay | Admitting: General Practice

## 2015-02-15 NOTE — Telephone Encounter (Signed)
New patient appointment made with Dr. Louanne Skye.  Patient has no insurance and is currently taking BP meds and gabapentin.

## 2015-02-17 ENCOUNTER — Encounter: Payer: Self-pay | Admitting: Family Medicine

## 2015-02-17 ENCOUNTER — Ambulatory Visit (INDEPENDENT_AMBULATORY_CARE_PROVIDER_SITE_OTHER): Payer: Self-pay | Admitting: Family Medicine

## 2015-02-17 ENCOUNTER — Encounter (INDEPENDENT_AMBULATORY_CARE_PROVIDER_SITE_OTHER): Payer: Self-pay

## 2015-02-17 VITALS — BP 174/100 | HR 92 | Temp 97.6°F | Ht 69.0 in | Wt 194.0 lb

## 2015-02-17 DIAGNOSIS — I10 Essential (primary) hypertension: Secondary | ICD-10-CM

## 2015-02-17 DIAGNOSIS — M797 Fibromyalgia: Secondary | ICD-10-CM

## 2015-02-17 MED ORDER — HYDROCHLOROTHIAZIDE 25 MG PO TABS: 25.0000 mg | ORAL_TABLET | Freq: Every day | ORAL | Status: DC

## 2015-02-17 MED ORDER — GABAPENTIN 800 MG PO TABS: 800.0000 mg | ORAL_TABLET | Freq: Three times a day (TID) | ORAL | Status: DC

## 2015-02-17 MED ORDER — ASPIRIN EC 81 MG PO TBEC: 81.0000 mg | DELAYED_RELEASE_TABLET | Freq: Every day | ORAL | Status: DC

## 2015-02-17 NOTE — Assessment & Plan Note (Signed)
BP is up

## 2015-02-17 NOTE — Progress Notes (Signed)
BP 174/100 mmHg  Pulse 92  Temp(Src) 97.6 F (36.4 C) (Oral)  Ht _0  (1.753 m)  Wt 194 lb (87.998 kg)  BMI 28.64 kg/m2   Subjective:    Patient ID: Mathew Brady, male    DOB: Jun 15, 1966, 48 y.o.   MRN: [id number]  HPI: Mathew Brady is a 49 y.o. male presenting on 02/17/2015 for Establish Care   HPI Hypertension Patient presents today as a new patient to establish care, he does have a history of hypertension for which she takes hydrochlorothiazide some of the time. He did not take it today. His blood pressure today is 174/100. He does have a headache today but denies vision changes, chest pains or shortness of breath. He denies any history of renal damage from his blood pressure but has not had labs since October.  Fibromyalgia Has a history of being diagnosed with multiple fibromyalgia for many years and has been on Neurontin and controlled on that dose. He describes pain in his shoulders and his lower legs and in his back.  Relevant past medical, surgical, family and social history reviewed and updated as indicated. Interim medical history since our last visit reviewed. Allergies and medications reviewed and updated.  Review of Systems  Constitutional: Negative for fever and chills.  HENT: Negative for ear discharge and ear pain.   Eyes: Negative for discharge and visual disturbance.  Respiratory: Negative for chest tightness, shortness of breath and wheezing.   Cardiovascular: Negative for chest pain and leg swelling.  Gastrointestinal: Negative for abdominal pain, diarrhea and constipation.  Genitourinary: Negative for difficulty urinating.  Musculoskeletal: Positive for myalgias. Negative for back pain and gait problem.  Skin: Negative for rash.  Neurological: Positive for headaches. Negative for dizziness, syncope and light-headedness.  All other systems reviewed and are negative.   Per HPI unless specifically indicated above     Medication List       This list is  accurate as of: 02/17/15  4:37 PM.  Always use your most recent med list.               aspirin EC 81 MG tablet  Take 1 tablet (81 mg total) by mouth daily.     gabapentin 800 MG tablet  Commonly known as:  NEURONTIN  Take 1 tablet (800 mg total) by mouth 3 (three) times daily.     hydrochlorothiazide 25 MG tablet  Commonly known as:  HYDRODIURIL  Take 1 tablet (25 mg total) by mouth daily.           Objective:    BP 174/100 mmHg  Pulse 92  Temp(Src) 97.6 F (36.4 C) (Oral)  Ht _1  (1.753 m)  Wt 194 lb (87.998 kg)  BMI 28.64 kg/m2  Wt Readings from Last 3 Encounters:  02/17/15 194 lb (87.998 kg)  05/17/14 180 lb (81.647 kg)  05/14/14 188 lb (85.276 kg)    Physical Exam  Constitutional: He is oriented to person, place, and time. He appears well-developed and well-nourished. No distress.  Eyes: Conjunctivae and EOM are normal. Pupils are equal, round, and reactive to light. Right eye exhibits no discharge. No scleral icterus.  Cardiovascular: Normal rate, regular rhythm, normal heart sounds and intact distal pulses.   No murmur heard. Pulmonary/Chest: Effort normal and breath sounds normal. No respiratory distress. He has no wheezes.  Abdominal: He exhibits no distension.  Musculoskeletal: Normal range of motion. He exhibits tenderness (diffuse tenderness at multiple points). He exhibits no edema.  Neurological: He is alert and oriented to person, place, and time. Coordination normal.  Skin: Skin is warm and dry. No rash noted. He is not diaphoretic.  Psychiatric: He has a normal mood and affect. His behavior is normal.  Vitals reviewed.   Results for orders placed or performed during the hospital encounter of 04/20/14  CBC  Result Value Ref Range   WBC 8.7 4.0 - 10.5 K/uL   RBC 4.74 4.22 - 5.81 MIL/uL   Hemoglobin 15.9 13.0 - 17.0 g/dL   HCT 46.0 39.0 - 52.0 %   MCV 97.0 78.0 - 100.0 fL   MCH 33.5 26.0 - 34.0 pg   MCHC 34.6 30.0 - 36.0 g/dL   RDW 13.2 11.5 -  15.5 %   Platelets 265 150 - 400 K/uL  Basic metabolic panel  Result Value Ref Range   Sodium 141 137 - 147 mEq/L   Potassium 3.6 (L) 3.7 - 5.3 mEq/L   Chloride 102 96 - 112 mEq/L   CO2 26 19 - 32 mEq/L   Glucose, Bld 113 (H) 70 - 99 mg/dL   BUN 8 6 - 23 mg/dL   Creatinine, Ser 0.82 0.50 - 1.35 mg/dL   Calcium 9.4 8.4 - 10.5 mg/dL   GFR calc non Af Amer >90 >90 mL/min   GFR calc Af Amer >90 >90 mL/min   Anion gap 13 5 - 15  Troponin I (MHP)  Result Value Ref Range   Troponin I <0.30 <0.30 ng/mL  Hepatic function panel  Result Value Ref Range   Total Protein 7.5 6.0 - 8.3 g/dL   Albumin 4.3 3.5 - 5.2 g/dL   AST 29 0 - 37 U/L   ALT 23 0 - 53 U/L   Alkaline Phosphatase 101 39 - 117 U/L   Total Bilirubin 0.3 0.3 - 1.2 mg/dL   Bilirubin, Direct <0.2 0.0 - 0.3 mg/dL   Indirect Bilirubin NOT CALCULATED 0.3 - 0.9 mg/dL  Lipase, blood  Result Value Ref Range   Lipase 24 11 - 59 U/L  Urinalysis, Routine w reflex microscopic  Result Value Ref Range   Color, Urine YELLOW YELLOW   APPearance CLEAR CLEAR   Specific Gravity, Urine <1.005 (L) 1.005 - 1.030   pH 7.0 5.0 - 8.0   Glucose, UA NEGATIVE NEGATIVE mg/dL   Hgb urine dipstick NEGATIVE NEGATIVE   Bilirubin Urine NEGATIVE NEGATIVE   Ketones, ur NEGATIVE NEGATIVE mg/dL   Protein, ur NEGATIVE NEGATIVE mg/dL   Urobilinogen, UA 0.2 0.0 - 1.0 mg/dL   Nitrite NEGATIVE NEGATIVE   Leukocytes, UA NEGATIVE NEGATIVE      Assessment & Plan:   Problem List Items Addressed This Visit      Cardiovascular and Mediastinum   Benign hypertension - Primary    BP is up.      Relevant Medications   aspirin EC 81 MG tablet   hydrochlorothiazide (HYDRODIURIL) 25 MG tablet   Other Relevant Orders   BMP8+EGFR   Lipid panel     Musculoskeletal and Integument   Fibromyalgia    Takes neurontin and is controlled      Relevant Medications   aspirin EC 81 MG tablet       Follow up plan: Return in about 4 weeks (around 03/17/2015), or if  symptoms worsen or fail to improve.  Caryl Pina, MD [facility] 02/17/2015, 4:37 PM

## 2015-02-17 NOTE — Assessment & Plan Note (Signed)
Takes neurontin and is controlled

## 2015-02-17 NOTE — Patient Instructions (Signed)

## 2015-03-17 ENCOUNTER — Encounter: Payer: Self-pay | Admitting: Family Medicine

## 2015-03-17 ENCOUNTER — Ambulatory Visit: Payer: Self-pay | Admitting: Family Medicine

## 2015-03-17 VITALS — BP 158/95 | HR 84 | Temp 98.7°F | Ht 69.0 in | Wt 198.8 lb

## 2015-03-17 DIAGNOSIS — M797 Fibromyalgia: Secondary | ICD-10-CM

## 2015-03-17 DIAGNOSIS — Z125 Encounter for screening for malignant neoplasm of prostate: Secondary | ICD-10-CM

## 2015-03-17 DIAGNOSIS — I10 Essential (primary) hypertension: Secondary | ICD-10-CM

## 2015-03-17 MED ORDER — LISINOPRIL 20 MG PO TABS: 20.0000 mg | ORAL_TABLET | Freq: Every day | ORAL | Status: DC

## 2015-03-17 MED ORDER — AMITRIPTYLINE HCL 25 MG PO TABS: 25.0000 mg | ORAL_TABLET | Freq: Every day | ORAL | Status: DC

## 2015-03-17 NOTE — Assessment & Plan Note (Signed)
Blood pressure is still elevated. It is improved from before now is back on his hydrochlorothiazide. We will add lisinopril and have him check his labs.

## 2015-03-17 NOTE — Progress Notes (Signed)
BP 158/95 mmHg  Pulse 84  Temp(Src) 98.7 F (37.1 C) (Oral)  Ht 5' 9" (1.753 m)  Wt 198 lb 12.8 oz (90.175 kg)  BMI 29.34 kg/m2   Subjective:    Patient ID: Mathew Brady, male    DOB: 1965/09/24, 49 y.o.   MRN: [id number]  HPI: Mathew Brady is a 49 y.o. male presenting on 03/17/2015 for Followup hypertension and Pain   HPI Hypertension Patient has elevated blood pressures still but is improved from before, he continues to take his hydrochlorothiazide without any major side effects. At home he is getting readings in the 140s range but he says he never gets them down in the 1:30 range so the likelihood is he still needs a second medication. He denies any headaches, blurred vision, chest pain, shortness of breath.  Fibromyalgia Patient is currently taking Neurontin for his neuropathic and fibromyalgia he says he needs something more to go at nighttime. He rates his neuropathic pain as 10 out of 10 when it is causing him problems and it wakes him up at night.  Relevant past medical, surgical, family and social history reviewed and updated as indicated. Interim medical history since our last visit reviewed. Allergies and medications reviewed and updated.  Review of Systems  Constitutional: Negative for fever and chills.  HENT: Negative for ear discharge and ear pain.   Eyes: Negative for discharge and visual disturbance.  Respiratory: Negative for shortness of breath and wheezing.   Cardiovascular: Negative for chest pain and leg swelling.  Gastrointestinal: Negative for abdominal pain, diarrhea and constipation.  Genitourinary: Negative for difficulty urinating.  Musculoskeletal: Negative for back pain and gait problem.  Skin: Negative for rash.  Neurological: Positive for numbness (and tingling in his feet). Negative for dizziness, syncope, light-headedness and headaches.  All other systems reviewed and are negative.   Per HPI unless specifically indicated above     Medication  List       This list is accurate as of: 03/17/15  4:10 PM.  Always use your most recent med list.               amitriptyline 25 MG tablet  Commonly known as:  ELAVIL  Take 1 tablet (25 mg total) by mouth at bedtime.     aspirin EC 81 MG tablet  Take 1 tablet (81 mg total) by mouth daily.     gabapentin 800 MG tablet  Commonly known as:  NEURONTIN  Take 1 tablet (800 mg total) by mouth 3 (three) times daily.     hydrochlorothiazide 25 MG tablet  Commonly known as:  HYDRODIURIL  Take 1 tablet (25 mg total) by mouth daily.     lisinopril 20 MG tablet  Commonly known as:  PRINIVIL,ZESTRIL  Take 1 tablet (20 mg total) by mouth daily.          Objective:    BP 158/95 mmHg  Pulse 84  Temp(Src) 98.7 F (37.1 C) (Oral)  Ht 5' 9" (1.753 m)  Wt 198 lb 12.8 oz (90.175 kg)  BMI 29.34 kg/m2  Wt Readings from Last 3 Encounters:  03/17/15 198 lb 12.8 oz (90.175 kg)  02/17/15 194 lb (87.998 kg)  05/17/14 180 lb (81.647 kg)    Physical Exam  Constitutional: He is oriented to person, place, and time. He appears well-developed and well-nourished. No distress.  Eyes: Conjunctivae and EOM are normal. Pupils are equal, round, and reactive to light. Right eye exhibits no discharge. No scleral  icterus.  Cardiovascular: Normal rate, regular rhythm, normal heart sounds and intact distal pulses.   No murmur heard. Pulmonary/Chest: Effort normal and breath sounds normal. No respiratory distress. He has no wheezes.  Abdominal: He exhibits no distension.  Musculoskeletal: Normal range of motion. He exhibits no edema.  Neurological: He is alert and oriented to person, place, and time. A sensory deficit (Decreased sensation per patient in feet and legs) is present. He displays a negative Romberg sign. Coordination and gait normal.  Skin: Skin is warm and dry. No rash noted. He is not diaphoretic.  Psychiatric: He has a normal mood and affect. His behavior is normal.  Vitals  reviewed.   Results for orders placed or performed during the hospital encounter of 04/20/14  CBC  Result Value Ref Range   WBC 8.7 4.0 - 10.5 K/uL   RBC 4.74 4.22 - 5.81 MIL/uL   Hemoglobin 15.9 13.0 - 17.0 g/dL   HCT 46.0 39.0 - 52.0 %   MCV 97.0 78.0 - 100.0 fL   MCH 33.5 26.0 - 34.0 pg   MCHC 34.6 30.0 - 36.0 g/dL   RDW 13.2 11.5 - 15.5 %   Platelets 265 150 - 400 K/uL  Basic metabolic panel  Result Value Ref Range   Sodium 141 137 - 147 mEq/L   Potassium 3.6 (L) 3.7 - 5.3 mEq/L   Chloride 102 96 - 112 mEq/L   CO2 26 19 - 32 mEq/L   Glucose, Bld 113 (H) 70 - 99 mg/dL   BUN 8 6 - 23 mg/dL   Creatinine, Ser 0.82 0.50 - 1.35 mg/dL   Calcium 9.4 8.4 - 10.5 mg/dL   GFR calc non Af Amer >90 >90 mL/min   GFR calc Af Amer >90 >90 mL/min   Anion gap 13 5 - 15  Troponin I (MHP)  Result Value Ref Range   Troponin I <0.30 <0.30 ng/mL  Hepatic function panel  Result Value Ref Range   Total Protein 7.5 6.0 - 8.3 g/dL   Albumin 4.3 3.5 - 5.2 g/dL   AST 29 0 - 37 U/L   ALT 23 0 - 53 U/L   Alkaline Phosphatase 101 39 - 117 U/L   Total Bilirubin 0.3 0.3 - 1.2 mg/dL   Bilirubin, Direct <0.2 0.0 - 0.3 mg/dL   Indirect Bilirubin NOT CALCULATED 0.3 - 0.9 mg/dL  Lipase, blood  Result Value Ref Range   Lipase 24 11 - 59 U/L  Urinalysis, Routine w reflex microscopic  Result Value Ref Range   Color, Urine YELLOW YELLOW   APPearance CLEAR CLEAR   Specific Gravity, Urine <1.005 (L) 1.005 - 1.030   pH 7.0 5.0 - 8.0   Glucose, UA NEGATIVE NEGATIVE mg/dL   Hgb urine dipstick NEGATIVE NEGATIVE   Bilirubin Urine NEGATIVE NEGATIVE   Ketones, ur NEGATIVE NEGATIVE mg/dL   Protein, ur NEGATIVE NEGATIVE mg/dL   Urobilinogen, UA 0.2 0.0 - 1.0 mg/dL   Nitrite NEGATIVE NEGATIVE   Leukocytes, UA NEGATIVE NEGATIVE      Assessment & Plan:   Problem List Items Addressed This Visit      Cardiovascular and Mediastinum   Benign hypertension    Blood pressure is still elevated. It is improved  from before now is back on his hydrochlorothiazide. We will add lisinopril and have him check his labs.      Relevant Medications   lisinopril (PRINIVIL,ZESTRIL) 20 MG tablet   Other Relevant Orders   CMP14+EGFR   Lipid panel  Musculoskeletal and Integument   Fibromyalgia - Primary    Patient has worsening fibromyalgia and neuropathic pain in his feet. He feels like the Neurontin helps during the day but is not helping at night. We will try to add Elavil at night to help with the pain.      Relevant Medications   amitriptyline (ELAVIL) 25 MG tablet   Other Relevant Orders   Vit D  25 hydroxy (rtn osteoporosis monitoring)    Other Visit Diagnoses    Prostate cancer screening        Relevant Orders    PSA, total and free       The patient did not check his labs since last visit we will add the labs back on and have him recheck it fasting in the next week or so.  Follow up plan: Return in about 3 months (around 06/16/2015), or if symptoms worsen or fail to improve, for hypertension.  Caryl Pina, MD [facility] 03/17/2015, 4:10 PM

## 2015-03-17 NOTE — Assessment & Plan Note (Signed)
Patient has worsening fibromyalgia and neuropathic pain in his feet. He feels like the Neurontin helps during the day but is not helping at night. We will try to add Elavil at night to help with the pain.

## 2015-03-22 ENCOUNTER — Emergency Department (HOSPITAL_COMMUNITY): Payer: BLUE CROSS/BLUE SHIELD

## 2015-03-22 ENCOUNTER — Encounter (HOSPITAL_COMMUNITY): Payer: Self-pay | Admitting: *Deleted

## 2015-03-22 ENCOUNTER — Emergency Department (HOSPITAL_COMMUNITY)
Admission: EM | Admit: 2015-03-22 | Discharge: 2015-03-22 | Disposition: A | Discharge status: Home / Self Care | Payer: BLUE CROSS/BLUE SHIELD | Attending: Emergency Medicine | Admitting: Emergency Medicine

## 2015-03-22 DIAGNOSIS — Z79899 Other long term (current) drug therapy: Secondary | ICD-10-CM | POA: Diagnosis not present

## 2015-03-22 DIAGNOSIS — F419 Anxiety disorder, unspecified: Secondary | ICD-10-CM | POA: Diagnosis not present

## 2015-03-22 DIAGNOSIS — S0083XA Contusion of other part of head, initial encounter: Secondary | ICD-10-CM | POA: Diagnosis not present

## 2015-03-22 DIAGNOSIS — J449 Chronic obstructive pulmonary disease, unspecified: Secondary | ICD-10-CM | POA: Insufficient documentation

## 2015-03-22 DIAGNOSIS — S20411A Abrasion of right back wall of thorax, initial encounter: Secondary | ICD-10-CM | POA: Insufficient documentation

## 2015-03-22 DIAGNOSIS — S40011A Contusion of right shoulder, initial encounter: Secondary | ICD-10-CM | POA: Diagnosis not present

## 2015-03-22 DIAGNOSIS — Z862 Personal history of diseases of the blood and blood-forming organs and certain disorders involving the immune mechanism: Secondary | ICD-10-CM | POA: Insufficient documentation

## 2015-03-22 DIAGNOSIS — Z7982 Long term (current) use of aspirin: Secondary | ICD-10-CM | POA: Diagnosis not present

## 2015-03-22 DIAGNOSIS — Z8719 Personal history of other diseases of the digestive system: Secondary | ICD-10-CM | POA: Insufficient documentation

## 2015-03-22 DIAGNOSIS — S20221A Contusion of right back wall of thorax, initial encounter: Secondary | ICD-10-CM | POA: Insufficient documentation

## 2015-03-22 DIAGNOSIS — G8929 Other chronic pain: Secondary | ICD-10-CM | POA: Insufficient documentation

## 2015-03-22 DIAGNOSIS — Z720 Tobacco use: Secondary | ICD-10-CM | POA: Insufficient documentation

## 2015-03-22 DIAGNOSIS — M797 Fibromyalgia: Secondary | ICD-10-CM | POA: Insufficient documentation

## 2015-03-22 DIAGNOSIS — Y9389 Activity, other specified: Secondary | ICD-10-CM | POA: Insufficient documentation

## 2015-03-22 DIAGNOSIS — I10 Essential (primary) hypertension: Secondary | ICD-10-CM | POA: Insufficient documentation

## 2015-03-22 DIAGNOSIS — S79922A Unspecified injury of left thigh, initial encounter: Secondary | ICD-10-CM | POA: Insufficient documentation

## 2015-03-22 DIAGNOSIS — M1990 Unspecified osteoarthritis, unspecified site: Secondary | ICD-10-CM | POA: Diagnosis not present

## 2015-03-22 DIAGNOSIS — Y998 Other external cause status: Secondary | ICD-10-CM | POA: Diagnosis not present

## 2015-03-22 DIAGNOSIS — Y9289 Other specified places as the place of occurrence of the external cause: Secondary | ICD-10-CM | POA: Insufficient documentation

## 2015-03-22 DIAGNOSIS — T07XXXA Unspecified multiple injuries, initial encounter: Secondary | ICD-10-CM

## 2015-03-22 DIAGNOSIS — S060X0A Concussion without loss of consciousness, initial encounter: Secondary | ICD-10-CM | POA: Insufficient documentation

## 2015-03-22 DIAGNOSIS — S0990XA Unspecified injury of head, initial encounter: Secondary | ICD-10-CM | POA: Diagnosis present

## 2015-03-22 LAB — BASIC METABOLIC PANEL
ANION GAP: 9 (ref 5–15)
BUN: 15 mg/dL (ref 6–20)
CO2: 25 mmol/L (ref 22–32)
Calcium: 8.8 mg/dL — ABNORMAL LOW (ref 8.9–10.3)
Chloride: 106 mmol/L (ref 101–111)
Creatinine, Ser: 0.96 mg/dL (ref 0.61–1.24)
GFR calc Af Amer: >60 mL/min (ref >60)
GLUCOSE: 89 mg/dL (ref 65–99)
Potassium: 3.7 mmol/L (ref 3.5–5.1)
SODIUM: 140 mmol/L (ref 135–145)

## 2015-03-22 LAB — CBC
HCT: 41.8 % (ref 39.0–52.0)
Hemoglobin: 14.2 g/dL (ref 13.0–17.0)
MCH: 33.8 pg (ref 26.0–34.0)
MCHC: 34 g/dL (ref 30.0–36.0)
MCV: 99.5 fL (ref 78.0–100.0)
PLATELETS: 338 10*3/uL (ref 150–400)
RBC: 4.2 MIL/uL — AB (ref 4.22–5.81)
RDW: 12.8 % (ref 11.5–15.5)
WBC: 11.2 10*3/uL — ABNORMAL HIGH (ref 4.0–10.5)

## 2015-03-22 MED ORDER — OXYCODONE-ACETAMINOPHEN 5-325 MG PO TABS: 2.0000 | ORAL_TABLET | Freq: Four times a day (QID) | ORAL | Status: DC | PRN

## 2015-03-22 MED ORDER — HYDROMORPHONE HCL 1 MG/ML IJ SOLN
1.0000 mg | Freq: Once | INTRAMUSCULAR | Status: AC
  Administered 2015-03-22: 1 mg via INTRAVENOUS
  Filled 2015-03-22: qty 1

## 2015-03-22 MED ORDER — OXYCODONE-ACETAMINOPHEN 5-325 MG PO TABS: 2.0000 | ORAL_TABLET | Freq: Once | ORAL | Status: DC

## 2015-03-22 MED ORDER — IBUPROFEN 800 MG PO TABS: 800.0000 mg | ORAL_TABLET | Freq: Three times a day (TID) | ORAL | Status: DC
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
## 2015-03-22 NOTE — Discharge Instructions (Signed)

## 2015-04-10 ENCOUNTER — Other Ambulatory Visit: Payer: Self-pay | Admitting: Family Medicine

## 2015-04-27 ENCOUNTER — Ambulatory Visit (INDEPENDENT_AMBULATORY_CARE_PROVIDER_SITE_OTHER): Payer: BLUE CROSS/BLUE SHIELD | Admitting: Family Medicine

## 2015-04-27 ENCOUNTER — Encounter: Payer: Self-pay | Admitting: Family Medicine

## 2015-04-27 DIAGNOSIS — G5793 Unspecified mononeuropathy of bilateral lower limbs: Secondary | ICD-10-CM | POA: Diagnosis not present

## 2015-04-27 MED ORDER — GABAPENTIN 600 MG PO TABS: 1200.0000 mg | ORAL_TABLET | Freq: Three times a day (TID) | ORAL | Status: DC

## 2015-04-27 NOTE — Patient Instructions (Signed)

## 2015-04-27 NOTE — Progress Notes (Signed)
There were no vitals taken for this visit.   Subjective:    Patient ID: Mathew Brady, male    DOB: July 15, 1965, 49 y.o.   MRN: [id number]  HPI: Mathew Brady is a 49 y.o. male presenting on 04/27/2015 for Leg Pain   HPI Worsening neuropathy Patient has been having worsening neuropathy over the past week. It is been keeping him up at night. He describes it as a burning sensation going down into both feet. He has it during the day but it is especially worse at night. The Neurontin that he has had has not been helping and the amitriptyline wasn't really helping either. Is wondering if he can get something increase for the pain.  Relevant past medical, surgical, family and social history reviewed and updated as indicated. Interim medical history since our last visit reviewed. Allergies and medications reviewed and updated.  Review of Systems  Constitutional: Negative for fever.  HENT: Negative for ear discharge and ear pain.   Eyes: Negative for discharge and visual disturbance.  Respiratory: Negative for shortness of breath and wheezing.   Cardiovascular: Negative for chest pain and leg swelling.  Gastrointestinal: Negative for abdominal pain, diarrhea and constipation.  Genitourinary: Negative for difficulty urinating.  Musculoskeletal: Negative for back pain and gait problem.  Skin: Negative for rash.  Neurological: Positive for weakness and numbness (tingling and burning). Negative for syncope, light-headedness and headaches.  All other systems reviewed and are negative.   Per HPI unless specifically indicated above     Medication List       This list is accurate as of: 04/27/15 11:24 AM.  Always use your most recent med list.               amitriptyline 25 MG tablet  Commonly known as:  ELAVIL  Take 1 tablet (25 mg total) by mouth at bedtime.     aspirin EC 81 MG tablet  Take 1 tablet (81 mg total) by mouth daily.     gabapentin 600 MG tablet  Commonly known as:   NEURONTIN  Take 2 tablets (1,200 mg total) by mouth 3 (three) times daily.     hydrochlorothiazide 25 MG tablet  Commonly known as:  HYDRODIURIL  Take 1 tablet (25 mg total) by mouth daily.     ibuprofen 800 MG tablet  Commonly known as:  ADVIL,MOTRIN  Take 1 tablet (800 mg total) by mouth 3 (three) times daily.     lisinopril 20 MG tablet  Commonly known as:  PRINIVIL,ZESTRIL  Take 1 tablet (20 mg total) by mouth daily.     oxyCODONE-acetaminophen 5-325 MG tablet  Commonly known as:  ROXICET  Take 2 tablets by mouth every 6 (six) hours as needed for severe pain.           Objective:    There were no vitals taken for this visit.  Wt Readings from Last 3 Encounters:  03/22/15 199 lb (90.266 kg)  03/17/15 198 lb 12.8 oz (90.175 kg)  02/17/15 194 lb (87.998 kg)    Physical Exam  Constitutional: He is oriented to person, place, and time. He appears well-developed and well-nourished. No distress.  Eyes: Conjunctivae and EOM are normal. Pupils are equal, round, and reactive to light. Right eye exhibits no discharge. No scleral icterus.  Cardiovascular: Normal rate, regular rhythm, normal heart sounds and intact distal pulses.   No murmur heard. Pulmonary/Chest: Effort normal and breath sounds normal. No respiratory distress. He has no wheezes.  Musculoskeletal: Normal range of motion. He exhibits no edema or tenderness.  No tenderness to palpation on examination of the feet, skin is intact, sensation is intact.  Neurological: He is alert and oriented to person, place, and time. Coordination normal.  Skin: Skin is warm and dry. No rash noted. He is not diaphoretic.  Psychiatric: He has a normal mood and affect. His behavior is normal.  Vitals reviewed.   Results for orders placed or performed during the hospital encounter of 03/22/15  CBC  Result Value Ref Range   WBC 11.2 (H) 4.0 - 10.5 K/uL   RBC 4.20 (L) 4.22 - 5.81 MIL/uL   Hemoglobin 14.2 13.0 - 17.0 g/dL   HCT 16.141.8  09.639.0 - 04.552.0 %   MCV 99.5 78.0 - 100.0 fL   MCH 33.8 26.0 - 34.0 pg   MCHC 34.0 30.0 - 36.0 g/dL   RDW 40.912.8 81.111.5 - 91.415.5 %   Platelets 338 150 - 400 K/uL  Basic metabolic panel  Result Value Ref Range   Sodium 140 135 - 145 mmol/L   Potassium 3.7 3.5 - 5.1 mmol/L   Chloride 106 101 - 111 mmol/L   CO2 25 22 - 32 mmol/L   Glucose, Bld 89 65 - 99 mg/dL   BUN 15 6 - 20 mg/dL   Creatinine, Ser 7.820.96 0.61 - 1.24 mg/dL   Calcium 8.8 (L) 8.9 - 10.3 mg/dL   GFR calc non Af Amer >60 >60 mL/min   GFR calc Af Amer >60 >60 mL/min   Anion gap 9 5 - 15      Assessment & Plan:   Problem List Items Addressed This Visit    None    Visit Diagnoses    Neuropathy involving both lower extremities (HCC)    -  Primary    Would like to go see neurology, will increase gabapentin.    Relevant Medications    gabapentin (NEURONTIN) 600 MG tablet    Other Relevant Orders    Ambulatory referral to Neurology        Follow up plan: Return if symptoms worsen or fail to improve.  Arville CareJoshua Dettinger, MD [facility] 04/27/2015, 11:24 AM

## 2015-06-05 ENCOUNTER — Ambulatory Visit (INDEPENDENT_AMBULATORY_CARE_PROVIDER_SITE_OTHER): Payer: BLUE CROSS/BLUE SHIELD | Admitting: Neurology

## 2015-06-05 ENCOUNTER — Encounter: Payer: Self-pay | Admitting: Neurology

## 2015-06-05 ENCOUNTER — Other Ambulatory Visit (INDEPENDENT_AMBULATORY_CARE_PROVIDER_SITE_OTHER): Payer: BLUE CROSS/BLUE SHIELD

## 2015-06-05 VITALS — BP 160/104 | HR 91 | Ht 69.0 in | Wt 204.5 lb

## 2015-06-05 DIAGNOSIS — R7303 Prediabetes: Secondary | ICD-10-CM

## 2015-06-05 DIAGNOSIS — G609 Hereditary and idiopathic neuropathy, unspecified: Secondary | ICD-10-CM

## 2015-06-05 LAB — TSH: TSH: 0.82 u[IU]/mL (ref 0.35–4.50)

## 2015-06-05 LAB — HEMOGLOBIN A1C: HEMOGLOBIN A1C: 5.5 % (ref 4.6–6.5)

## 2015-06-05 MED ORDER — DESIPRAMINE HCL 10 MG PO TABS
ORAL_TABLET | ORAL | Status: DC
Start: 2015-06-05 — End: 2015-08-08

## 2015-06-05 NOTE — Progress Notes (Signed)
[facility] Clinic Note - Initial Visit   Date: 06/05/2015  Mathew Brady MRN: [id number] DOB: 18-Mar-1966   Dear Dr. Warrick Parisian:  Thank you for your kind referral of Mathew Brady for consultation of neuropathy. Although his history is well known to you, please allow [place] to reiterate it for the purpose of our medical record. The patient was accompanied to the clinic by wife who also provides collateral information.     History of Present Illness: Mathew Brady is a 49 y.o. right-handed Caucasian male with COPD, hypertension, fibromyalgia, GERD, and diet-controlled prediabetes presenting for evaluation of neurpathy.  Starting around 2015, he noticed burning and tingling sensation involving the sole and dorsum of the feet.  Over the year, it has progressed to involve his lower legs.  Symptoms are worse at night time.  He legs feel weak with prolonged walking.  He endorses low back pain.  Rest generally improves his pain. No identifiable triggers.  No bowel/bladder problems.  Denies paresthesias of the hands.  He is currently taking gabapentin 1278m TID which helps some and amitriptyline 254mas needed.    He has diet-controlled diabetes.  No history of alcohol use or thyroid disorder.  No family history of neuropathy.  Out-side paper records, electronic medical record, and images have been reviewed where available and summarized as:  Lab Results  Component Value Date   TSH 2.083 06/17/2012   Lab Results  Component Value Date   HGBA1C 5.7* 06/18/2012   No results found for: [id number] Past Medical History  Diagnosis Date  . Hypertension   . Arthritis   . Fibromyalgia   . COPD [place]ch[place] obstructive pulmonary disease) ([place]  . Anxiety   . Acid reflux   . Prediabetes 06/18/2012  . Anemia 06/18/2012  . Chest pain 06/17/2012    MI ruled out  . Rash 06/18/2012    Query eczema  . Chronic abdominal pain   . Opiate use   . Chronic prescription  benzodiazepine use   . Palpitations     Past Surgical History  Procedure Laterality Date  . Esophageal dilation  1993     Medications:  Outpatient Encounter Prescriptions as of 06/05/2015  Medication Sig  . amitriptyline (ELAVIL) 25 MG tablet Take 1 tablet (25 mg total) by mouth at bedtime.  . Marland Kitchenspirin EC 81 MG tablet Take 1 tablet (81 mg total) by mouth daily.  . Marland Kitchenabapentin (NEURONTIN) 600 MG tablet Take 2 tablets (1,200 mg total) by mouth 3 (three) times daily.  . hydrochlorothiazide (HYDRODIURIL) 25 MG tablet Take 1 tablet (25 mg total) by mouth daily.  . Marland Kitchenbuprofen (ADVIL,MOTRIN) 800 MG tablet Take 1 tablet (800 mg total) by mouth 3 (three) times daily.  . Marland Kitchenisinopril (PRINIVIL,ZESTRIL) 20 MG tablet Take 1 tablet (20 mg total) by mouth daily.  . [DISCONTINUED] oxyCODONE-acetaminophen (ROXICET) 5-325 MG per tablet Take 2 tablets by mouth every 6 (six) hours as needed for severe pain.   No facility-administered encounter medications on file as of 06/05/2015.     Allergies: No Known Allergies  Family History: Family History  Problem Relation Age of Onset  . Bladder Cancer Father [id number]. Benign prostatic hyperplasia Father   . Fibromyalgia Mother     Social History: Social History  Substance Use Topics  . Smoking status: Former Smoker -- 2.00 packs/day for 10 years    Types: Cigarettes    Quit date: 03/09/2015  . Smokeless tobacco: Never Used  .  Alcohol Use: No   Social History   Social History Narrative    Review of Systems:  CONSTITUTIONAL: No fevers, chills, night sweats, or weight loss.   EYES: No visual changes or eye pain ENT: No hearing changes.  No history of nose bleeds.   RESPIRATORY: No cough, wheezing and shortness of breath.   CARDIOVASCULAR: Negative for chest pain, and palpitations.   GI: Negative for abdominal discomfort, blood in stools or black stools.  No recent change in bowel habits.   GU:  No history of incontinence.   MUSCLOSKELETAL: +history  of joint pain or swelling.  +myalgias.   SKIN: Negative for lesions, rash, and itching.   HEMATOLOGY/ONCOLOGY: Negative for prolonged bleeding, bruising easily, and swollen nodes.  No history of cancer.   ENDOCRINE: Negative for cold or heat intolerance, polydipsia or goiter.   PSYCH:  +depression or anxiety symptoms.   NEURO: As Above.   Vital Signs:  BP 160/104 mmHg  Pulse 91  Ht _0  (1.753 m)  Wt 204 lb 8 oz (92.761 kg)  BMI 30.19 kg/m2  SpO2 99% Pain Scale: 0 on a scale of 0-10   General Medical Exam:   General:  Well appearing, comfortable.   Eyes/ENT: see cranial nerve examination.   Neck: No masses appreciated.  Full range of motion without tenderness.  No carotid bruits. Respiratory:  Clear to auscultation, good air entry bilaterally.   Cardiac:  Regular rate and rhythm, no murmur.   Extremities:  No deformities, edema, or skin discoloration.  Skin:  No rashes or lesions.  Neurological Exam: MENTAL STATUS including orientation to time, place, person, recent and remote memory, attention span and concentration, language, and fund of knowledge is normal.  Speech is not dysarthric.  CRANIAL NERVES: II:  No visual field defects.  Unremarkable fundi.   III-IV-VI: Pupils equal round and reactive to light.  Normal conjugate, extra-ocular eye movements in all directions of gaze.  No nystagmus.  No ptosis.   V:  Normal facial sensation.    VII:  Normal facial symmetry and movements.  No pathologic facial reflexes.  VIII:  Normal hearing and vestibular function.   IX-X:  Normal palatal movement.   XI:  Normal shoulder shrug and head rotation.   XII:  Normal tongue strength and range of motion, no deviation or fasciculation.  MOTOR:  No atrophy, fasciculations or abnormal movements.  No pronator drift.  Tone is normal.    Right Upper Extremity:    Left Upper Extremity:    Deltoid  5/5   Deltoid  5/5   Biceps  5/5   Biceps  5/5   Triceps  5/5   Triceps  5/5   Wrist extensors   5/5   Wrist extensors  5/5   Wrist flexors  5/5   Wrist flexors  5/5   Finger extensors  5/5   Finger extensors  5/5   Finger flexors  5/5   Finger flexors  5/5   Dorsal interossei  5/5   Dorsal interossei  5/5   Abductor pollicis  5/5   Abductor pollicis  5/5   Tone (Ashworth scale)  0  Tone (Ashworth scale)  0   Right Lower Extremity:    Left Lower Extremity:    Hip flexors  5/5   Hip flexors  5/5   Hip extensors  5/5   Hip extensors  5/5   Knee flexors  5/5   Knee flexors  5/5   Knee extensors  5/5   Knee extensors  5/5   Dorsiflexors  5/5   Dorsiflexors  5/5   Plantarflexors  5/5   Plantarflexors  5/5   Toe extensors  5/5   Toe extensors  5/5   Toe flexors  5/5   Toe flexors  5/5   Tone (Ashworth scale)  0  Tone (Ashworth scale)  0   MSRs:  Right                                                                 Left brachioradialis 2+  brachioradialis 2+  biceps 2+  biceps 2+  triceps 2+  triceps 2+  patellar 3+  patellar 3+  ankle jerk 2+  ankle jerk 2+  Hoffman no  Hoffman no  plantar response down  plantar response down   SENSORY: Reduced pin prick and temperature distal to ankles bilaterally.  Vibration is 25% at the ankles, 20% at great toe.  Proprioception at the great toe intact.  Mild sway with Rhomberg testing.   COORDINATION/GAIT: Normal finger-to- nose-finger and heel-to-shin.  Intact rapid alternating movements bilaterally.  Able to rise from a chair without using arms.  Gait narrow based and stable. Unsteadiness with tandem and stressed gait.    IMPRESSION: 1.  Peripheral neuropathy manifesting with painful paresthesias of the feet and legs 2.  Possible neurogenic claudication   PLAN/RECOMMENDATIONS:  1.  Check ESR, TSH, vitamin B12, copper, SPEP with IFE, HbA1c 2.  NCS/EMG of the legs 3.  Stop amitriptyline 4.  Start desimpramine 87m at bedtime for two weeks, then increase to 269mat bedtime 5.  Continue gabapentin 120074mhree times daily 6.  If no  improvement, consider switching gabapentin to Lyrica and/or MRI lumbar spine  Return to clinic in 2-3 months.   The duration of this appointment visit was 40 minutes of face-to-face time with the patient.  Greater than 50% of this time was spent in counseling, explanation of diagnosis, planning of further management, and coordination of care.   Thank you for allowing me to participate in patient's care.  If I can answer any additional questions, I would be pleased to do so.    Sincerely,    Donika K. PatPosey ProntoO

## 2015-06-05 NOTE — Patient Instructions (Signed)
1.  Stop amitriptyline 2.  Start desimpramine 10mg  at bedtime for two weeks, then increase to 20mg  at bedtime 3.  Continue gabapentin 1200mg  three times daily 4.  Check blood work 5.  NCS/EMG of the legs 6.  Call with update in 6 weeks.  If no improvement, consider Lyrica   Return to clinic in 2-3 months

## 2015-06-06 LAB — VITAMIN B12: VITAMIN B 12: 150 pg/mL — AB (ref 211–911)

## 2015-06-06 LAB — SEDIMENTATION RATE: Sed Rate: 5 mm/hr (ref 0–22)

## 2015-06-07 LAB — SPEP & IFE WITH QIG
ALBUMIN ELP: 4.6 g/dL (ref 3.8–4.8)
ALPHA-1-GLOBULIN: 0.3 g/dL (ref 0.2–0.3)
ALPHA-2-GLOBULIN: 0.7 g/dL (ref 0.5–0.9)
BETA 2: 0.3 g/dL (ref 0.2–0.5)
Beta Globulin: 0.5 g/dL (ref 0.4–0.6)
GAMMA GLOBULIN: 0.9 g/dL (ref 0.8–1.7)
IgA: 97 mg/dL (ref 68–379)
IgG (Immunoglobin G), Serum: 1050 mg/dL (ref 650–1600)
IgM, Serum: 106 mg/dL (ref 41–251)
TOTAL PROTEIN, SERUM ELECTROPHOR: 7.3 g/dL (ref 6.1–8.1)

## 2015-06-08 ENCOUNTER — Other Ambulatory Visit: Payer: Self-pay | Admitting: Family Medicine

## 2015-06-09 LAB — COPPER, SERUM: COPPER: 80 ug/dL (ref 70–175)

## 2015-06-12 ENCOUNTER — Other Ambulatory Visit: Payer: Self-pay | Admitting: *Deleted

## 2015-06-12 DIAGNOSIS — E538 Deficiency of other specified B group vitamins: Secondary | ICD-10-CM

## 2015-06-12 MED ORDER — "SYRINGE 23G X 1"" 3 ML MISC": 1.0000 | Freq: Once | Status: DC

## 2015-06-12 MED ORDER — CYANOCOBALAMIN 1000 MCG/ML IJ SOLN
1000.0000 ug | Freq: Once | INTRAMUSCULAR | Status: DC
Start: 2015-06-12 — End: 2017-05-13

## 2015-06-15 ENCOUNTER — Ambulatory Visit (INDEPENDENT_AMBULATORY_CARE_PROVIDER_SITE_OTHER): Payer: BLUE CROSS/BLUE SHIELD

## 2015-06-15 DIAGNOSIS — E538 Deficiency of other specified B group vitamins: Secondary | ICD-10-CM | POA: Diagnosis not present

## 2015-06-15 MED ORDER — CYANOCOBALAMIN 1000 MCG/ML IJ SOLN: 1000.0000 ug | Freq: Once | INTRAMUSCULAR | Status: DC

## 2015-06-15 NOTE — Progress Notes (Signed)
Pt presented to clinic for B12 injection. Administered in right deltoid. Tolerated well.

## 2015-06-19 ENCOUNTER — Ambulatory Visit (INDEPENDENT_AMBULATORY_CARE_PROVIDER_SITE_OTHER): Payer: BLUE CROSS/BLUE SHIELD | Admitting: Family Medicine

## 2015-06-19 ENCOUNTER — Encounter: Payer: Self-pay | Admitting: Family Medicine

## 2015-06-19 VITALS — BP 165/103 | HR 82 | Temp 98.8°F | Ht 69.0 in | Wt 203.4 lb

## 2015-06-19 DIAGNOSIS — Z1211 Encounter for screening for malignant neoplasm of colon: Secondary | ICD-10-CM

## 2015-06-19 DIAGNOSIS — G6289 Other specified polyneuropathies: Secondary | ICD-10-CM

## 2015-06-19 DIAGNOSIS — I10 Essential (primary) hypertension: Secondary | ICD-10-CM | POA: Diagnosis not present

## 2015-06-19 DIAGNOSIS — G629 Polyneuropathy, unspecified: Secondary | ICD-10-CM | POA: Insufficient documentation

## 2015-06-19 DIAGNOSIS — E785 Hyperlipidemia, unspecified: Secondary | ICD-10-CM

## 2015-06-19 MED ORDER — LISINOPRIL-HYDROCHLOROTHIAZIDE 20-25 MG PO TABS: 1.0000 | ORAL_TABLET | Freq: Every day | ORAL | Status: DC

## 2015-06-19 MED ORDER — PREGABALIN 100 MG PO CAPS: 100.0000 mg | ORAL_CAPSULE | Freq: Three times a day (TID) | ORAL | Status: DC

## 2015-06-19 MED ORDER — AMLODIPINE BESYLATE 5 MG PO TABS: 5.0000 mg | ORAL_TABLET | Freq: Every day | ORAL | Status: DC

## 2015-06-19 NOTE — Progress Notes (Signed)
BP 165/103 mmHg  Pulse 82  Temp(Src) 98.8 F (37.1 C) (Oral)  Ht  (1.753 m)  Wt 203 lb 6.4 oz (92.262 kg)  BMI 30.02 kg/m2   Subjective:    Patient ID: Mathew Brady, male    DOB: 01/26/1966, 49 y.o.   MRN: [id number]  HPI: Mathew Brady is a 49 y.o. male presenting on 06/19/2015 for Peripheral Neuropathy   HPI Hypertension Patient is coming in for a hypertension recheck today. He is currently taking lisinopril hydrochlorothiazide 20-12.5. His blood pressure today is 165/103 and per his wife she has been getting similar numbers when she checks it at home. He claims that this is because of his neuropathic pain. Patient denies headaches, blurred vision, chest pains, shortness of breath, or weakness. Denies any side effects from medication and is content with current medication.   Peripheral neuropathy Patient has had persistent peripheral neuropathy in bilateral lower extremities. He has been taking Neurontin 1200 mg 3 times daily without success. He would like to discuss other options. He is seen a neurologist still currently and they are working him up for his neuropathy. They did start him on desipramine on top of the Neurontin. He feels like it is not helping much at this point. He would like to try something differently. He describes it as a tingling and burning sensation in bilateral lower feet and legs. Per him the neurology testing did come back positive for neuropathy on the nerve function testing.  Hyperlipidemia Patient has had elevated triglycerides specifically and was started on TriCor. He is doing well on TriCor so far and is due for recheck.  Relevant past medical, surgical, family and social history reviewed and updated as indicated. Interim medical history since our last visit reviewed. Allergies and medications reviewed and updated.  Review of Systems  Constitutional: Negative for fever.  HENT: Negative for ear discharge and ear pain.   Eyes: Negative for discharge  and visual disturbance.  Respiratory: Negative for chest tightness, shortness of breath and wheezing.   Cardiovascular: Negative for chest pain and leg swelling.  Gastrointestinal: Negative for abdominal pain, diarrhea and constipation.  Genitourinary: Negative for difficulty urinating.  Musculoskeletal: Negative for back pain and gait problem.  Skin: Negative for rash.  Neurological: Positive for weakness and numbness (tingling and burning). Negative for dizziness, syncope, light-headedness and headaches.  All other systems reviewed and are negative.   Per HPI unless specifically indicated above     Medication List       This list is accurate as of: 06/19/15  5:25 PM.  Always use your most recent med list.               amLODipine 5 MG tablet  Commonly known as:  NORVASC  Take 1 tablet (5 mg total) by mouth daily.     aspirin EC 81 MG tablet  Take 1 tablet (81 mg total) by mouth daily.     cyanocobalamin 1000 MCG/ML injection  Commonly known as:  (VITAMIN B-12)  Inject 1 mL (1,000 mcg total) into the muscle once.     desipramine 10 MG tablet  Commonly known as:  NOPRAMIN  Take 1 tablet at bedtime for two weeks, then increase to 2 tablets at bedtime.     ibuprofen 800 MG tablet  Commonly known as:  ADVIL,MOTRIN  Take 1 tablet (800 mg total) by mouth 3 (three) times daily.     lisinopril-hydrochlorothiazide 20-25 MG tablet  Commonly known as:  PRINZIDE,ZESTORETIC  Take 1 tablet by mouth daily.     pregabalin 100 MG capsule  Commonly known as:  LYRICA  Take 1 capsule (100 mg total) by mouth 3 (three) times daily.     SYRINGE 3CC/23GX1" 23G X 1" 3 ML Misc  1 Package by Does not apply route once.           Objective:    BP 165/103 mmHg  Pulse 82  Temp(Src) 98.8 F (37.1 C) (Oral)  Ht  (1.753 m)  Wt 203 lb 6.4 oz (92.262 kg)  BMI 30.02 kg/m2  Wt Readings from Last 3 Encounters:  06/19/15 203 lb 6.4 oz (92.262 kg)  06/05/15 204 lb 8 oz (92.761 kg)    03/22/15 199 lb (90.266 kg)    Physical Exam  Constitutional: He is oriented to person, place, and time. He appears well-developed and well-nourished. No distress.  Eyes: Conjunctivae and EOM are normal. Pupils are equal, round, and reactive to light. Right eye exhibits no discharge. No scleral icterus.  Neck: Neck supple. No thyromegaly present.  Cardiovascular: Normal rate, regular rhythm, normal heart sounds and intact distal pulses.   No murmur heard. Pulmonary/Chest: Effort normal and breath sounds normal. No respiratory distress. He has no wheezes.  Musculoskeletal: Normal range of motion. He exhibits no edema or tenderness.  No tenderness to palpation on examination of the feet, skin is intact, sensation is intact.  Lymphadenopathy:    He has no cervical adenopathy.  Neurological: He is alert and oriented to person, place, and time. Coordination normal.  Skin: Skin is warm and dry. No rash noted. He is not diaphoretic.  Psychiatric: He has a normal mood and affect. His behavior is normal.  Vitals reviewed.   Results for orders placed or performed in visit on 06/05/15  Copper, serum  Result Value Ref Range   Copper 80 70 - 175 mcg/dL  Sedimentation rate  Result Value Ref Range   Sed Rate 5 0 - 22 mm/hr  TSH  Result Value Ref Range   TSH 0.82 0.35 - 4.50 uIU/mL  Vitamin B12  Result Value Ref Range   Vitamin B-12 150 (L) 211 - 911 pg/mL  SPEP & IFE with QIG  Result Value Ref Range   IgG (Immunoglobin G), Serum 1050 650 - 1600 mg/dL   IgA 97 68 - 161 mg/dL   IgM, Serum 096 41 - 045 mg/dL   Immunofix Electr Int SEE NOTE    Total Protein, Serum Electrophoresis 7.3 6.1 - 8.1 g/dL   Albumin ELP 4.6 3.8 - 4.8 g/dL   WUJWJ-1-BJYNWGNF 0.3 0.2 - 0.3 g/dL   AOZHY-8-MVHQIONG 0.7 0.5 - 0.9 g/dL   Beta Globulin 0.5 0.4 - 0.6 g/dL   Beta 2 0.3 0.2 - 0.5 g/dL   Gamma Globulin 0.9 0.8 - 1.7 g/dL   Abnormal Protein Band1 NOT DET g/dL   SPE Interp. SEE NOTE    COMMENT (PROTEIN  ELECTROPHOR) SEE NOTE    Abnormal Protein Band2 NOT DET g/dL   Abnormal Protein Band3 NOT DET g/dL  Hemoglobin E9B  Result Value Ref Range   Hgb A1c MFr Bld 5.5 4.6 - 6.5 %      Assessment & Plan:   Problem List Items Addressed This Visit      Cardiovascular and Mediastinum   Benign hypertension   Relevant Medications   lisinopril-hydrochlorothiazide (PRINZIDE,ZESTORETIC) 20-25 MG tablet   amLODipine (NORVASC) 5 MG tablet   fenofibrate (TRICOR) 145 MG tablet  Nervous and Auditory   Neuropathy, peripheral (HCC)   Relevant Medications   pregabalin (LYRICA) 100 MG capsule    Other Visit Diagnoses    Special screening for malignant neoplasms, colon    -  Primary    Relevant Orders    Ambulatory referral to Gastroenterology    Fecal occult blood, imunochemical    Hyperlipidemia LDL goal <130        Relevant Medications    lisinopril-hydrochlorothiazide (PRINZIDE,ZESTORETIC) 20-25 MG tablet    amLODipine (NORVASC) 5 MG tablet    fenofibrate (TRICOR) 145 MG tablet    Other Relevant Orders    Lipid panel (Completed)        Follow up plan: Return in about 4 weeks (around 07/17/2015), or if symptoms worsen or fail to improve, for F/u Lyrica.  Counseling provided for all of the vaccine components Orders Placed This Encounter  Procedures  . Fecal occult blood, imunochemical  . Lipid panel  . Ambulatory referral to Gastroenterology    Arville CareJoshua Epimenio Schetter, MD [facility] 06/19/2015, 5:25 PM

## 2015-06-20 LAB — LIPID PANEL
CHOL/HDL RATIO: 6.7 ratio — AB (ref 0.0–5.0)
Cholesterol, Total: 241 mg/dL — ABNORMAL HIGH (ref 100–199)
HDL: 36 mg/dL — ABNORMAL LOW (ref >39)
LDL Calculated: 136 mg/dL — ABNORMAL HIGH (ref 0–99)
TRIGLYCERIDES: 346 mg/dL — AB (ref 0–149)
VLDL Cholesterol Cal: 69 mg/dL — ABNORMAL HIGH (ref 5–40)

## 2015-06-20 MED ORDER — FENOFIBRATE 145 MG PO TABS: 145.0000 mg | ORAL_TABLET | Freq: Every day | ORAL | Status: DC

## 2015-06-21 ENCOUNTER — Encounter (HOSPITAL_COMMUNITY): Payer: Self-pay | Admitting: Emergency Medicine

## 2015-06-21 ENCOUNTER — Emergency Department (HOSPITAL_COMMUNITY)
Admission: EM | Admit: 2015-06-21 | Discharge: 2015-06-22 | Disposition: A | Discharge status: Home / Self Care | Payer: BLUE CROSS/BLUE SHIELD | Attending: Emergency Medicine | Admitting: Emergency Medicine

## 2015-06-21 DIAGNOSIS — I10 Essential (primary) hypertension: Secondary | ICD-10-CM | POA: Insufficient documentation

## 2015-06-21 DIAGNOSIS — R197 Diarrhea, unspecified: Secondary | ICD-10-CM | POA: Insufficient documentation

## 2015-06-21 DIAGNOSIS — Z87891 Personal history of nicotine dependence: Secondary | ICD-10-CM | POA: Insufficient documentation

## 2015-06-21 DIAGNOSIS — Z791 Long term (current) use of non-steroidal anti-inflammatories (NSAID): Secondary | ICD-10-CM | POA: Diagnosis not present

## 2015-06-21 DIAGNOSIS — Z8719 Personal history of other diseases of the digestive system: Secondary | ICD-10-CM | POA: Insufficient documentation

## 2015-06-21 DIAGNOSIS — Z79899 Other long term (current) drug therapy: Secondary | ICD-10-CM | POA: Diagnosis not present

## 2015-06-21 DIAGNOSIS — Z8659 Personal history of other mental and behavioral disorders: Secondary | ICD-10-CM | POA: Insufficient documentation

## 2015-06-21 DIAGNOSIS — G8929 Other chronic pain: Secondary | ICD-10-CM | POA: Diagnosis not present

## 2015-06-21 DIAGNOSIS — Z862 Personal history of diseases of the blood and blood-forming organs and certain disorders involving the immune mechanism: Secondary | ICD-10-CM | POA: Diagnosis not present

## 2015-06-21 DIAGNOSIS — J449 Chronic obstructive pulmonary disease, unspecified: Secondary | ICD-10-CM | POA: Insufficient documentation

## 2015-06-21 DIAGNOSIS — Z7982 Long term (current) use of aspirin: Secondary | ICD-10-CM | POA: Insufficient documentation

## 2015-06-21 DIAGNOSIS — M797 Fibromyalgia: Secondary | ICD-10-CM | POA: Diagnosis not present

## 2015-06-21 MED ORDER — AMLODIPINE BESYLATE 5 MG PO TABS: 5.0000 mg | ORAL_TABLET | Freq: Every day | ORAL | Status: DC

## 2015-06-21 MED ORDER — HYDROCODONE-ACETAMINOPHEN 5-325 MG PO TABS: 1.0000 | ORAL_TABLET | ORAL | Status: DC | PRN

## 2015-06-21 MED ORDER — AMLODIPINE BESYLATE 10 MG PO TABS
10.0000 mg | ORAL_TABLET | Freq: Every day | ORAL | Status: DC
Start: 2015-06-21 — End: 2015-06-21

## 2015-06-21 MED ORDER — HYDROCODONE-ACETAMINOPHEN 5-325 MG PO TABS
1.0000 | ORAL_TABLET | Freq: Once | ORAL | Status: AC
  Administered 2015-06-21: 1 via ORAL
  Filled 2015-06-21: qty 1

## 2015-06-21 MED ORDER — AMLODIPINE BESYLATE 10 MG PO TABS: 10.0000 mg | ORAL_TABLET | Freq: Every day | ORAL | Status: DC

## 2015-06-21 NOTE — ED Provider Notes (Signed)
CSN: [id number]     Arrival date & time 06/21/15  2159 History   First MD Initiated Contact with Patient 06/21/15 2205     Chief Complaint  Patient presents with  . Hypertension   (Consider location/radiation/quality/duration/timing/severity/associated sxs/prior Treatment) HPI 49 y.o. male with  has a past medical history of Hypertension; Arthritis; Fibromyalgia; COPD (chronic obstructive pulmonary disease) (HCC); Anxiety; Acid reflux; Prediabetes (06/18/2012); Anemia (06/18/2012); Chest pain (06/17/2012); Rash (06/18/2012); Chronic abdominal pain; Opiate use; Chronic prescription benzodiazepine use; and Palpitations., presents to the Emergency Department today complaining of hypertension. States that he was in church this evening sitting down until he started feeling flush in his face. Proceeded to go to the bathroom to cool his face. His wife took his blood pressure and noticed that it was elevated at 190/100 and wanted to get checked out in the ED. No changes in vision currently. No H/A. Has nausea with no vomiting.    Past Medical History  Diagnosis Date  . Hypertension   . Arthritis   . Fibromyalgia   . COPD (chronic obstructive pulmonary disease) (HCC)   . Anxiety   . Acid reflux   . Prediabetes 06/18/2012  . Anemia 06/18/2012  . Chest pain 06/17/2012    MI ruled out  . Rash 06/18/2012    Query eczema  . Chronic abdominal pain   . Opiate use   . Chronic prescription benzodiazepine use   . Palpitations    Past Surgical History  Procedure Laterality Date  . Esophageal dilation  1993   Family History  Problem Relation Age of Onset  . Bladder Cancer Father 33  . Benign prostatic hyperplasia Father   . Fibromyalgia Mother   . Healthy Sister   . Healthy Son    Social History  Substance Use Topics  . Smoking status: Former Smoker -- 2.00 packs/day for 10 years    Types: Cigarettes    Quit date: 03/09/2015  . Smokeless tobacco: Never Used  . Alcohol Use: No     Review of Systems  Constitutional: Positive for diaphoresis. Negative for fever, chills and fatigue.  HENT: Negative for congestion, sinus pressure, sore throat and tinnitus.   Eyes: Positive for visual disturbance. Negative for pain.  Respiratory: Negative for cough and shortness of breath.   Cardiovascular: Negative for chest pain.  Gastrointestinal: Positive for nausea and diarrhea. Negative for vomiting, abdominal pain and constipation.  Endocrine: Negative for cold intolerance and heat intolerance.  Musculoskeletal: Negative for back pain.  Skin: Negative for color change.  Neurological: Negative for dizziness, syncope, weakness, light-headedness, numbness and headaches.   Allergies  Review of patient's allergies indicates no known allergies.  Home Medications   Prior to Admission medications   Medication Sig Start Date End Date Taking? Authorizing Provider  amLODipine (NORVASC) 5 MG tablet Take 1 tablet (5 mg total) by mouth daily. 06/19/15   Elige Radon Dettinger, MD  aspirin EC 81 MG tablet Take 1 tablet (81 mg total) by mouth daily. 02/17/15   Elige Radon Dettinger, MD  cyanocobalamin (,VITAMIN B-12,) 1000 MCG/ML injection Inject 1 mL (1,000 mcg total) into the muscle once. 06/12/15   Donika K Patel, DO  desipramine (NOPRAMIN) 10 MG tablet Take 1 tablet at bedtime for two weeks, then increase to 2 tablets at bedtime. 06/05/15   Donika K Patel, DO  fenofibrate (TRICOR) 145 MG tablet Take 1 tablet (145 mg total) by mouth daily. 06/20/15   Elige Radon Dettinger, MD  ibuprofen (ADVIL,MOTRIN) 800 MG tablet  Take 1 tablet (800 mg total) by mouth 3 (three) times daily. 03/22/15   Eber HongBrian Miller, MD  lisinopril-hydrochlorothiazide (PRINZIDE,ZESTORETIC) 20-25 MG tablet Take 1 tablet by mouth daily. 06/19/15   Elige RadonJoshua A Dettinger, MD  pregabalin (LYRICA) 100 MG capsule Take 1 capsule (100 mg total) by mouth 3 (three) times daily. 06/19/15   Elige RadonJoshua A Dettinger, MD  Syringe/Needle, Disp, (SYRINGE  3CC/23GX1") 23G X 1" 3 ML MISC 1 Package by Does not apply route once. 06/12/15   Donika K Patel, DO   BP 111/78 mmHg  Pulse 99  Temp(Src) 98 F (36.7 C) (Oral)  Resp 20  Ht 5\' 9"  (1.753 m)  Wt 92.08 kg  BMI 29.96 kg/m2  SpO2 100% Physical Exam  Constitutional: He is oriented to person, place, and time. He appears well-developed and well-nourished.  HENT:  Head: Normocephalic and atraumatic.  Eyes: Conjunctivae and EOM are normal. Pupils are equal, round, and reactive to light.  Fundoscopic exam:      The right eye shows no AV nicking, no hemorrhage and no papilledema.       The left eye shows no AV nicking, no hemorrhage and no papilledema.  Neck: Normal range of motion. Neck supple.  Cardiovascular: Normal rate, regular rhythm, S1 normal, S2 normal and intact distal pulses.   Pulmonary/Chest: Effort normal and breath sounds normal.  Abdominal: Soft. Bowel sounds are normal.  Musculoskeletal: Normal range of motion.  Neurological: He is alert and oriented to person, place, and time. He has normal strength. No cranial nerve deficit or sensory deficit. He displays a negative Romberg sign.  Skin: Skin is warm and dry.  Psychiatric: He has a normal mood and affect. His behavior is normal.  Vitals reviewed.   ED Course  Procedures (including critical care time) Labs Review Labs Reviewed - No data to display  Imaging Review No results found. I have personally reviewed and evaluated these images and lab results as part of my medical decision-making.   EKG Interpretation None     MDM  I have reviewed the relevant previous healthcare records. I obtained HPI from historian. Cases discussed with Attending Physician  ED Course: 11:40 PM- Given 1 tab Norco   Assessment: [age]y M presents with HTN. Currently no visual changes, H/A, chest pain. Able to ambulate well. Pulses equal bilateral extremities. Low suspicion for serious neurological/cardiac event.    Disposition/Plan:   Changed Norvasc to 10mg  from 5mg   Given Norco for pain relief  D/C home with f/u in 48 hours with PCP to monitor BP medications     Patient was discussed with Eber HongBrian Miller, MD    Final diagnoses:  Benign hypertension       Audry Piliyler Tilda Samudio, PA-C 06/21/15 2350  Eber HongBrian Miller, MD 06/25/15 1321

## 2015-06-21 NOTE — ED Provider Notes (Signed)
49 y/o male, hx of htn Has had a couple of headaches this week with some light headedness, no syncope, no CP, no SOB and no palpitations Has had evaluation by PCP - started norvasc for persistent hypertension with no improvement Has been taking other meds including HCTZ and Lisinopril but spaces them all out throughout the day.   On exam has normal neuro exam including speech, coordination, strength and CN 3-12 - he has no murmus, clear heart sounds, normal pulses and normal abdominal exam - clear lungs, no distress  BP has been labile which is what the pt describes at home as well. Meds for headache Home with f/u in 1 week - double norvac Take BP 2h after meds Pt in agreement Stable for d/c.  Filed Vitals:   06/21/15 2202 06/21/15 2303 06/21/15 2348  BP: 111/78 172/106 172/112  Pulse: 99  87  Temp: 98 F (36.7 C)  98.2 F (36.8 C)  TempSrc: Oral  Oral  Resp: 20  14  Height: 5\' 9"  (1.753 m)    Weight: 203 lb (92.08 kg)    SpO2: 100%  100%   Meds given in ED:  Medications  HYDROcodone-acetaminophen (NORCO/VICODIN) 5-325 MG per tablet 1 tablet (1 tablet Oral Given 06/21/15 2356)    Discharge Medication List as of 06/21/2015 11:45 PM    START taking these medications   Details  HYDROcodone-acetaminophen (NORCO/VICODIN) 5-325 MG tablet Take 1 tablet by mouth every 4 (four) hours as needed., Starting 06/21/2015, Until Discontinued, Print          Eber HongBrian Rodrick Payson, MD 06/25/15 1321

## 2015-06-21 NOTE — Discharge Instructions (Signed)
Please read and follow all provided instructions.  Your diagnoses today include:  1. Benign hypertension     Tests performed today include:  Vital signs. See below for your results today.   Medications prescribed:   Changed Norvasc (Amlodipine) to 10mg  from 5mg . Take Norvasc regularly as scheduled by your primary care provider.    Take Vicodin PRN for pain relief    Home care instructions:  Follow any educational materials contained in this packet.  Follow-up instructions: Please follow-up with your primary care provider in the next 48 hours for management of your hypertension.   Return instructions:   Please return to the Emergency Department if you experience worsening symptoms OR  - Fever (temperature greater than 101.16F)  - Bleeding that does not stop with holding pressure to the area    -Severe pain (please note that you may be more sore the day after your accident)  - Chest Pain  - Difficulty breathing  - Severe nausea or vomiting  - Inability to tolerate food and liquids  - Passing out  - Skin becoming red around your wounds  - Change in mental status (confusion or lethargy)  - New numbness or weakness     Please return if you have any other emergent concerns.  Additional Information:  Measure your blood pressure 2 hours after you take your medications. Document these blood pressures and present to your PCP for management of hypertension.    You have been prescribed a narcotic medication on an "as needed" basis. Take only as prescribed. Do not drive, operate any machinery or make any important decisions while taking this medication as it is sedating. It may cause constipation take over the counter stool softeners or add fiber to your diet to treat this (Metamucil, Psyllium Fiber, Colace, Miralax) Further refills will need to be obtained from your primary care doctor and will not be prescribed through the Emergency Department. You will test positive on most drug  tests while taking this medciation.    Your vital signs today were: BP 172/106 mmHg   Pulse 99   Temp(Src) 98 F (36.7 C) (Oral)   Resp 20   Ht 5\' 9"  (1.753 m)   Wt 92.08 kg   BMI 29.96 kg/m2   SpO2 100% If your blood pressure (BP) was elevated above 135/85 this visit, please have this repeated by your doctor within one month. ---------------

## 2015-06-21 NOTE — ED Notes (Signed)
Pt c/o hypertension 191/108 at home and states the tingling in his legs is worse.

## 2015-07-11 ENCOUNTER — Other Ambulatory Visit: Payer: Self-pay | Admitting: Family Medicine

## 2015-07-18 ENCOUNTER — Ambulatory Visit (INDEPENDENT_AMBULATORY_CARE_PROVIDER_SITE_OTHER): Payer: BLUE CROSS/BLUE SHIELD | Admitting: Neurology

## 2015-07-18 DIAGNOSIS — R7303 Prediabetes: Secondary | ICD-10-CM | POA: Diagnosis not present

## 2015-07-18 DIAGNOSIS — G609 Hereditary and idiopathic neuropathy, unspecified: Secondary | ICD-10-CM | POA: Diagnosis not present

## 2015-07-18 NOTE — Procedures (Signed)
[facility]  [address], [address]  [place], [place] [address] Tel: [phone] Fax:  [phone] Test Date:  07/18/2015  Patient: Mathew EaringJohn Brady DOB: 12/13/1965 Physician: Nita Sickleonika Patel  Sex: Male Height: 5\' 9"  Ref Phys: Nita Sickleatel, Donika  ID#: [id number] Temp: 32.3C Technician: Judie PetitM. Dean   Patient Complaints: This is a 50 year old gentleman referred for evaluation of bilateral lower extremity pain, numbness, weakness and tingling.  NCV & EMG Findings: Extensive electrodiagnostic testing of the right lower extremity and additional studies of the left shows: 1. Bilateral sural and superficial peroneal sensory responses are within normal limits. 2. Bilateral tibial and peroneal motor responses are within normal limits. 3. Bilateral tibial H reflex studies are within normal limits. 4. There is no evidence of active or chronic motor axon loss changes affecting any of the tested muscles. Motor unit configuration and recruitment pattern is within normal limits.  Impression: This is a normal study of the lower extremities. In particular, there is no evidence of a large fiber sensorimotor polyneuropathy or right lumbosacral radiculopathy. Small fiber neuropathy cannot be excluded by this study.   _____________________________ Nita Sickleonika Patel, D.O.    Nerve Conduction Studies Anti Sensory Summary Table   Stim Site NR Peak (ms) Norm Peak (ms) P-T Amp (V) Norm P-T Amp  Left Sup Peroneal Anti Sensory (Ant Lat Mall)  12 cm    2.8 <4.6 11.6 >4  Right Sup Peroneal Anti Sensory (Ant Lat Mall)  12 cm    2.8 <4.6 8.8 >4  Left Sural Anti Sensory (Lat Mall)    Longer distance at ankle  Calf    4.9 <4.6 10.3 >4  Right Sural Anti Sensory (Lat Mall)  Calf    3.9 <4.6 10.5 >4   Motor Summary Table   Stim Site NR Onset (ms) Norm Onset (ms) O-P Amp (mV) Norm O-P Amp Site1 Site2 Delta-0 (ms) Dist (cm) Vel (m/s) Norm Vel (m/s)  Left Peroneal Motor (Ext Dig Brev)  Ankle    4.9 <6.0 4.4 >2.5 B  Fib Ankle 8.0 34.0 43 >40  B Fib    12.9  3.9  Poplt B Fib 1.8 10.0 56 >40  Poplt    14.7  3.8         Right Peroneal Motor (Ext Dig Brev)  Ankle    4.2 <6.0 4.1 >2.5 B Fib Ankle 8.4 35.0 42 >40  B Fib    12.6  3.2  Poplt B Fib 2.2 10.0 45 >40  Poplt    14.8  3.2         Left Tibial Motor (Abd Hall Brev)  Ankle    5.3 <6.0 7.3 >4 Knee Ankle 10.5 43.0 41 >40  Knee    15.8  6.5         Right Tibial Motor (Abd Hall Brev)  Ankle    5.0 <6.0 10.8 >4 Knee Ankle 10.9 44.5 41 >40  Knee    15.9  7.4          H Reflex Studies   NR H-Lat (ms) Lat Norm (ms) L-R H-Lat (ms)  Left Tibial (Gastroc)     34.01 <35 0.95  Right Tibial (Gastroc)     34.97 <35 0.95   EMG   Side Muscle Ins Act Fibs Psw Fasc Number Recrt Dur Dur. Amp Amp. Poly Poly. Comment  Right AntTibialis Nml Nml Nml Nml Nml Nml Nml Nml Nml Nml Nml Nml N/A  Right Gastroc Nml Nml Nml Nml Nml Nml  Nml Nml Nml Nml Nml Nml N/A  Right Flex Dig Long Nml Nml Nml Nml Nml Nml Nml Nml Nml Nml Nml Nml N/A  Right RectFemoris Nml Nml Nml Nml Nml Nml Nml Nml Nml Nml Nml Nml N/A  Right BicepsFemS Nml Nml Nml Nml Nml Nml Nml Nml Nml Nml Nml Nml N/A  Right GluteusMed Nml Nml Nml Nml Nml Nml Nml Nml Nml Nml Nml Nml N/A      Waveforms:

## 2015-07-20 ENCOUNTER — Other Ambulatory Visit: Payer: Self-pay | Admitting: *Deleted

## 2015-07-20 DIAGNOSIS — G609 Hereditary and idiopathic neuropathy, unspecified: Secondary | ICD-10-CM

## 2015-08-08 ENCOUNTER — Encounter (HOSPITAL_COMMUNITY): Payer: Self-pay

## 2015-08-08 ENCOUNTER — Emergency Department (HOSPITAL_COMMUNITY)
Admission: EM | Admit: 2015-08-08 | Discharge: 2015-08-08 | Disposition: A | Discharge status: Home / Self Care | Payer: BLUE CROSS/BLUE SHIELD | Attending: Emergency Medicine | Admitting: Emergency Medicine

## 2015-08-08 DIAGNOSIS — S51812A Laceration without foreign body of left forearm, initial encounter: Secondary | ICD-10-CM | POA: Insufficient documentation

## 2015-08-08 DIAGNOSIS — Z79899 Other long term (current) drug therapy: Secondary | ICD-10-CM | POA: Insufficient documentation

## 2015-08-08 DIAGNOSIS — F419 Anxiety disorder, unspecified: Secondary | ICD-10-CM | POA: Insufficient documentation

## 2015-08-08 DIAGNOSIS — Z862 Personal history of diseases of the blood and blood-forming organs and certain disorders involving the immune mechanism: Secondary | ICD-10-CM | POA: Diagnosis not present

## 2015-08-08 DIAGNOSIS — S59912A Unspecified injury of left forearm, initial encounter: Secondary | ICD-10-CM | POA: Diagnosis present

## 2015-08-08 DIAGNOSIS — Z87891 Personal history of nicotine dependence: Secondary | ICD-10-CM | POA: Diagnosis not present

## 2015-08-08 DIAGNOSIS — Z8719 Personal history of other diseases of the digestive system: Secondary | ICD-10-CM | POA: Diagnosis not present

## 2015-08-08 DIAGNOSIS — M1990 Unspecified osteoarthritis, unspecified site: Secondary | ICD-10-CM | POA: Diagnosis not present

## 2015-08-08 DIAGNOSIS — M797 Fibromyalgia: Secondary | ICD-10-CM | POA: Diagnosis not present

## 2015-08-08 DIAGNOSIS — I10 Essential (primary) hypertension: Secondary | ICD-10-CM | POA: Diagnosis not present

## 2015-08-08 DIAGNOSIS — Y9289 Other specified places as the place of occurrence of the external cause: Secondary | ICD-10-CM | POA: Diagnosis not present

## 2015-08-08 DIAGNOSIS — J449 Chronic obstructive pulmonary disease, unspecified: Secondary | ICD-10-CM | POA: Diagnosis not present

## 2015-08-08 DIAGNOSIS — Y9389 Activity, other specified: Secondary | ICD-10-CM | POA: Diagnosis not present

## 2015-08-08 DIAGNOSIS — Y990 Civilian activity done for income or pay: Secondary | ICD-10-CM | POA: Insufficient documentation

## 2015-08-08 DIAGNOSIS — G8929 Other chronic pain: Secondary | ICD-10-CM | POA: Insufficient documentation

## 2015-08-08 DIAGNOSIS — Z7982 Long term (current) use of aspirin: Secondary | ICD-10-CM | POA: Insufficient documentation

## 2015-08-08 DIAGNOSIS — W260XXA Contact with knife, initial encounter: Secondary | ICD-10-CM | POA: Insufficient documentation

## 2015-08-08 MED ORDER — HYDROCODONE-ACETAMINOPHEN 5-325 MG PO TABS: 1.0000 | ORAL_TABLET | ORAL | Status: DC | PRN

## 2015-08-08 MED ORDER — LIDOCAINE-EPINEPHRINE (PF) 1 %-1:200000 IJ SOLN: 20.0000 mL | Freq: Once | INTRAMUSCULAR | Status: DC

## 2015-08-08 MED ORDER — LIDOCAINE-EPINEPHRINE (PF) 1 %-1:200000 IJ SOLN
20.0000 mL | Freq: Once | INTRAMUSCULAR | Status: AC
  Administered 2015-08-08: 20 mL
  Filled 2015-08-08: qty 20

## 2015-08-08 MED ORDER — OXYCODONE-ACETAMINOPHEN 5-325 MG PO TABS
1.0000 | ORAL_TABLET | Freq: Once | ORAL | Status: AC
  Administered 2015-08-08: 1 via ORAL
  Filled 2015-08-08: qty 1

## 2015-08-08 MED ORDER — TETANUS-DIPHTH-ACELL PERTUSSIS 5-2.5-18.5 LF-MCG/0.5 IM SUSP
0.5000 mL | Freq: Once | INTRAMUSCULAR | Status: AC
  Administered 2015-08-08: 0.5 mL via INTRAMUSCULAR
  Filled 2015-08-08: qty 0.5

## 2015-08-08 NOTE — ED Notes (Signed)
Pt reports accidentally cut left forearm with pocket knife.  Bleeding controlled.  Wound dressed in triage.

## 2015-08-08 NOTE — ED Provider Notes (Signed)
CSN: [id number]     Arrival date & time 08/08/15  1403 History   First MD Initiated Contact with Patient 08/08/15 1423     Chief Complaint  Patient presents with  . Laceration     (Consider location/radiation/quality/duration/timing/severity/associated sxs/prior Treatment) Patient is a 50 y.o. male presenting with skin laceration. The history is provided by the patient and medical records.  Laceration    50 year old male with history of hypertension, arthritis, fibromyalgia, COPD, anxiety, anemia, presenting to the ED for a laceration of his left volar forearm. Patient states he was working in his garage cutting some leather straps with his pocket knife when the blade slipped and he cut his forearm. Patient states there was some spewing of blood, however he applied pressure and this stopped. He has minimal bleeding on arrival. Patient is on daily aspirin, no other anticoagulation.  Past Medical History  Diagnosis Date  . Hypertension   . Arthritis   . Fibromyalgia   . COPD (chronic obstructive pulmonary disease) (HCC)   . Anxiety   . Acid reflux   . Prediabetes 06/18/2012  . Anemia 06/18/2012  . Chest pain 06/17/2012    MI ruled out  . Rash 06/18/2012    Query eczema  . Chronic abdominal pain   . Opiate use   . Chronic prescription benzodiazepine use   . Palpitations    Past Surgical History  Procedure Laterality Date  . Esophageal dilation  1993   Family History  Problem Relation Age of Onset  . Bladder Cancer Father 53  . Benign prostatic hyperplasia Father   . Fibromyalgia Mother   . Healthy Sister   . Healthy Son    Social History  Substance Use Topics  . Smoking status: Former Smoker -- 2.00 packs/day for 10 years    Types: Cigarettes    Quit date: 03/09/2015  . Smokeless tobacco: Never Used  . Alcohol Use: No    Review of Systems  Skin: Positive for wound.  All other systems reviewed and are negative.     Allergies  Review of patient's allergies  indicates no known allergies.  Home Medications   Prior to Admission medications   Medication Sig Start Date End Date Taking? Authorizing Provider  amLODipine (NORVASC) 10 MG tablet Take 1 tablet (10 mg total) by mouth daily. 06/21/15  Yes Audry Pili, PA-C  aspirin EC 81 MG tablet Take 1 tablet (81 mg total) by mouth daily. 02/17/15  Yes Elige Radon Dettinger, MD  cyanocobalamin (,VITAMIN B-12,) 1000 MCG/ML injection Inject 1 mL (1,000 mcg total) into the muscle once. 06/12/15  Yes Donika K Patel, DO  fenofibrate (TRICOR) 145 MG tablet Take 1 tablet (145 mg total) by mouth daily. 06/20/15  Yes Elige Radon Dettinger, MD  gabapentin (NEURONTIN) 600 MG tablet TAKE 2 TABLETS (1,200 MG TOTAL) BY MOUTH 3 (THREE) TIMES DAILY. 07/11/15  Yes Elige Radon Dettinger, MD  HYDROcodone-acetaminophen (NORCO/VICODIN) 5-325 MG tablet Take 1 tablet by mouth every 4 (four) hours as needed. 06/21/15  Yes Audry Pili, PA-C  lisinopril-hydrochlorothiazide (PRINZIDE,ZESTORETIC) 20-25 MG tablet Take 1 tablet by mouth daily. 06/19/15  Yes Elige Radon Dettinger, MD   BP 149/96 mmHg  Pulse 94  Temp(Src) 99 F (37.2 C) (Temporal)  Resp 18  Ht  (1.778 m)  Wt 90.719 kg  BMI 28.70 kg/m2  SpO2 97%   Physical Exam  Constitutional: He is oriented to person, place, and time. He appears well-developed and well-nourished.  HENT:  Head: Normocephalic and atraumatic.  Mouth/Throat: Oropharynx is clear and moist.  Eyes: Conjunctivae and EOM are normal. Pupils are equal, round, and reactive to light.  Neck: Normal range of motion.  Cardiovascular: Normal rate, regular rhythm and normal heart sounds.   Pulmonary/Chest: Effort normal and breath sounds normal. No respiratory distress. He has no wheezes.  Musculoskeletal: Normal range of motion.  4cm laceration to left volar forearm that is gaping open; no active bleeding currently; no evidence of deep tissue, vessel, or tendon involvement; full ROM of wrist and all fingers; strong radial  pulse and cap refill; normal sensation throughout hand  Neurological: He is alert and oriented to person, place, and time.  Skin: Skin is warm and dry.  Psychiatric: He has a normal mood and affect.  Nursing note and vitals reviewed.   ED Course  Procedures (including critical care time)  LACERATION REPAIR Performed by: Garlon Hatchet Authorized by: Garlon Hatchet Consent: Verbal consent obtained. Risks and benefits: risks, benefits and alternatives were discussed Consent given by: patient Patient identity confirmed: provided demographic data Prepped and Draped in normal sterile fashion Wound explored  Laceration Location: left volar forearm  Laceration Length: 4cm  No Foreign Bodies seen or palpated  Anesthesia: local infiltration  Local anesthetic: lidocaine 1% with epinephrine  Anesthetic total: 6 ml  Irrigation method: syringe Amount of cleaning: standard  Skin closure: 4-0 chromic gut (deep) and 4-0 prolene  Number of sutures: 3 deep, 5 superficial  Technique: simple interrupted  Patient tolerance: Patient tolerated the procedure well with no immediate complications.  Labs Review Labs Reviewed - No data to display  Imaging Review No results found. I have personally reviewed and evaluated these images and lab results as part of my medical decision-making.   EKG Interpretation None      MDM   Final diagnoses:  Forearm laceration, left, initial encounter   50 y.o. M here with left forearm laceration that occurred while cutting leather strap with pocket knife.  Bleeding well controlled on arrival.  Wound is gaping open and easily explored-- no evidence of deep tissue, vessel, or tendon involvement.  Hand remains neurovascularly intact with normal perfusion.  Tetanus updated here in ED.  Wound repaired as above, patient tolerated well.  Follow-up with PCP in 1 week for suture removal.  Discussed home wound care.  Short supply pain meds given at patient's  request.  Discussed plan with patient, he acknowledged understanding and agreed with plan of care.  Return precautions given for new or worsening symptoms.  Garlon Hatchet, PA-C 08/08/15 1610  Zadie Rhine, MD 08/09/15 [phone]

## 2015-08-08 NOTE — Discharge Instructions (Signed)
Take the prescribed medication as directed for pain.  Use caution, it can make you sleepy. Follow-up with your primary care physician for suture removal in 1 week.  Keep sutures clean and dry until this time. Return to the ED for new or worsening symptoms.

## 2015-08-15 ENCOUNTER — Ambulatory Visit (INDEPENDENT_AMBULATORY_CARE_PROVIDER_SITE_OTHER): Payer: BLUE CROSS/BLUE SHIELD | Admitting: Family Medicine

## 2015-08-15 ENCOUNTER — Encounter: Payer: Self-pay | Admitting: Family Medicine

## 2015-08-15 VITALS — BP 124/78 | HR 80 | Temp 99.0°F | Ht 70.0 in | Wt 214.0 lb

## 2015-08-15 DIAGNOSIS — T8133XA Disruption of traumatic injury wound repair, initial encounter: Secondary | ICD-10-CM

## 2015-08-15 DIAGNOSIS — L03114 Cellulitis of left upper limb: Secondary | ICD-10-CM

## 2015-08-15 DIAGNOSIS — T8130XA Disruption of wound, unspecified, initial encounter: Secondary | ICD-10-CM

## 2015-08-15 MED ORDER — CLINDAMYCIN HCL 300 MG PO CAPS: 600.0000 mg | ORAL_CAPSULE | Freq: Three times a day (TID) | ORAL | Status: DC

## 2015-08-15 MED ORDER — HYDROCODONE-ACETAMINOPHEN 5-325 MG PO TABS: 1.0000 | ORAL_TABLET | Freq: Four times a day (QID) | ORAL | Status: DC | PRN

## 2015-08-15 NOTE — Progress Notes (Signed)
BP 124/78 mmHg  Pulse 80  Temp(Src) 99 F (37.2 C) (Oral)  Ht  (1.778 m)  Wt 214 lb (97.07 kg)  BMI 30.71 kg/m2   Subjective:    Patient ID: Mathew Brady, male    DOB: Apr 04, 1966, 50 y.o.   MRN: [id number]  HPI: Mathew Brady is a 50 y.o. male presenting on 08/15/2015 for Laceration to right arm.  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 He went to the emergency department and the wound was sutured up. Over the past 7 days the wound has had increased swelling day by day and increased pain over the past 7 days as well. He denies any fevers or chills or decreased appetite or nausea or vomiting. Last night the wound opened up and purulent material came out of it along with blood. Currently the wound is open with swelling extending from about his elbow down through his hand. He denies any numbness or loss of circulation that he can tell. The drainage has reduced today. He has not been on antibiotic  Relevant past medical, surgical, family and social history reviewed and updated as indicated. Interim medical history since our last visit reviewed. Allergies and medications reviewed and updated.  Review of Systems  Constitutional: Negative for fever and chills.  HENT: Negative for ear discharge and ear pain.   Eyes: Negative for discharge and visual disturbance.  Respiratory: Negative for shortness of breath and wheezing.   Cardiovascular: Negative for chest pain and leg swelling.  Gastrointestinal: Negative for abdominal pain, diarrhea and constipation.  Genitourinary: Negative for difficulty urinating.  Musculoskeletal: Negative for back pain and gait problem.  Skin: Positive for color change and wound. Negative for rash.  Neurological: Negative for dizziness, syncope, light-headedness and headaches.  All other systems reviewed and are  negative.   Per HPI unless specifically indicated above     Medication List       This list is accurate as of: 08/15/15 12:13 PM.  Always use your most recent med list.               amLODipine 10 MG tablet  Commonly known as:  NORVASC  Take 1 tablet (10 mg total) by mouth daily.     aspirin EC 81 MG tablet  Take 1 tablet (81 mg total) by mouth daily.     clindamycin 300 MG capsule  Commonly known as:  CLEOCIN  Take 2 capsules (600 mg total) by mouth 3 (three) times daily.     cyanocobalamin 1000 MCG/ML injection  Commonly known as:  (VITAMIN B-12)  Inject 1 mL (1,000 mcg total) into the muscle once.     fenofibrate 145 MG tablet  Commonly known as:  TRICOR  Take 1 tablet (145 mg total) by mouth daily.     gabapentin 600 MG tablet  Commonly known as:  NEURONTIN  TAKE 2 TABLETS (1,200 MG TOTAL) BY MOUTH 3 (THREE) TIMES DAILY.     HYDROcodone-acetaminophen 5-325 MG tablet  Commonly known as:  NORCO/VICODIN  Take 1 tablet by mouth every 4 (four) hours as needed.     lisinopril-hydrochlorothiazide 20-25 MG tablet  Commonly known as:  PRINZIDE,ZESTORETIC  Take 1 tablet by mouth daily.           Objective:    BP 124/78 mmHg  Pulse 80  Temp(Src) 99 F (37.2 C) (Oral)  Ht  (1.778 m)  Wt 214 lb (97.07 kg)  BMI 30.71 kg/m2  Wt Readings from Last 3 Encounters:  08/15/15 214 lb (97.07 kg)  08/08/15 200 lb (90.719 kg)  06/21/15 203 lb (92.08 kg)    Physical Exam  Constitutional: He is oriented to person, place, and time. He appears well-developed and well-nourished. No distress.  Eyes: Conjunctivae and EOM are normal. Pupils are equal, round, and reactive to light. Right eye exhibits no discharge. No scleral icterus.  Cardiovascular: Normal rate, regular rhythm, normal heart sounds and intact distal pulses.   No murmur heard. Pulmonary/Chest: Effort normal and breath sounds normal. No respiratory distress. He has no wheezes.  Musculoskeletal: Normal range  of motion. He exhibits no edema.  Neurological: He is alert and oriented to person, place, and time. Coordination normal.  Skin: Skin is warm and dry. No rash noted. He is not diaphoretic.     Psychiatric: He has a normal mood and affect. His behavior is normal.  Vitals reviewed.   Results for orders placed or performed in visit on 06/19/15  Lipid panel  Result Value Ref Range   Cholesterol, Total 241 (H) 100 - 199 mg/dL   Triglycerides 161 (H) 0 - 149 mg/dL   HDL 36 (L) >09 mg/dL   VLDL Cholesterol Cal 69 (H) 5 - 40 mg/dL   LDL Calculated 604 (H) 0 - 99 mg/dL   Chol/HDL Ratio 6.7 (H) 0.0 - 5.0 ratio units      Assessment & Plan:   Problem List Items Addressed This Visit    None    Visit Diagnoses    Disruption of wound, initial encounter    -  Primary    Wound on left forearm that was sutured by ED and has opened up. Occurred 1 week ago. Concern for cellulitis with swelling down to hand    Relevant Medications    clindamycin (CLEOCIN) 300 MG capsule    HYDROcodone-acetaminophen (NORCO/VICODIN) 5-325 MG tablet    Cellulitis of left upper extremity        Relevant Medications    clindamycin (CLEOCIN) 300 MG capsule    HYDROcodone-acetaminophen (NORCO/VICODIN) 5-325 MG tablet        Follow up plan: Return in about 2 days (around 08/17/2015), or if symptoms worsen or fail to improve, for Follow up cellulitis and wound.  Counseling provided for all of the vaccine components No orders of the defined types were placed in this encounter.    Arville Care, MD [facility] 08/15/2015, 12:13 PM

## 2015-08-17 ENCOUNTER — Encounter: Payer: Self-pay | Admitting: Family Medicine

## 2015-08-17 ENCOUNTER — Ambulatory Visit (INDEPENDENT_AMBULATORY_CARE_PROVIDER_SITE_OTHER): Payer: BLUE CROSS/BLUE SHIELD

## 2015-08-17 ENCOUNTER — Ambulatory Visit (INDEPENDENT_AMBULATORY_CARE_PROVIDER_SITE_OTHER): Payer: BLUE CROSS/BLUE SHIELD | Admitting: Family Medicine

## 2015-08-17 VITALS — BP 167/102 | HR 84 | Temp 98.0°F | Ht 70.0 in | Wt 214.0 lb

## 2015-08-17 DIAGNOSIS — I10 Essential (primary) hypertension: Secondary | ICD-10-CM

## 2015-08-17 DIAGNOSIS — T8130XA Disruption of wound, unspecified, initial encounter: Secondary | ICD-10-CM | POA: Insufficient documentation

## 2015-08-17 DIAGNOSIS — T8130XD Disruption of wound, unspecified, subsequent encounter: Secondary | ICD-10-CM

## 2015-08-17 MED ORDER — FENOFIBRATE 145 MG PO TABS: 145.0000 mg | ORAL_TABLET | Freq: Every day | ORAL | Status: DC

## 2015-08-17 MED ORDER — LISINOPRIL-HYDROCHLOROTHIAZIDE 20-25 MG PO TABS: 1.0000 | ORAL_TABLET | Freq: Every day | ORAL | Status: DC

## 2015-08-17 MED ORDER — GABAPENTIN 600 MG PO TABS
ORAL_TABLET | ORAL | Status: DC
Start: 2015-08-17 — End: 2015-09-20

## 2015-08-17 MED ORDER — AMLODIPINE BESYLATE 10 MG PO TABS: 10.0000 mg | ORAL_TABLET | Freq: Every day | ORAL | Status: DC

## 2015-08-17 NOTE — Assessment & Plan Note (Signed)
BP elevated, will monitor for now because of current pain and infection.

## 2015-08-17 NOTE — Assessment & Plan Note (Signed)
Improved greatly, still open and has swelling in arms but swelling has reduced in hand

## 2015-08-17 NOTE — Progress Notes (Addendum)
BP 167/102 mmHg  Pulse 84  Temp(Src) 98 F (36.7 C) (Oral)  Ht  (1.778 m)  Wt 214 lb (97.07 kg)  BMI 30.71 kg/m2   Subjective:    Patient ID: Mathew Brady, male    DOB: 12/11/1965, 51 y.o.   MRN: [id number]  HPI: Mathew Brady is a 50 y.o. male presenting on 08/17/2015 for Followup wound of arm   HPI Wound recheck Patient was on 2 days worth of clindamycin 600 mg and the swelling has reduced greatly over the past 2 days. He denies any fevers or chills. He still has full range of motion in that arm and blood flow and sensation is still intact in the arm. He is concerned about the possibility of foreign body and is wondering if an x-ray might help. The incision site on his left anterior forearm is still open and draining mostly serosanguineous fluid. He denies any purulent fluid. His blood pressure is elevated because he is still having some pain with this and is still taking his Norco to control.  Relevant past medical, surgical, family and social history reviewed and updated as indicated. Interim medical history since our last visit reviewed. Allergies and medications reviewed and updated.  Review of Systems  Constitutional: Negative for fever and chills.  HENT: Negative for ear discharge and ear pain.   Eyes: Negative for discharge and visual disturbance.  Respiratory: Negative for shortness of breath and wheezing.   Cardiovascular: Negative for chest pain and leg swelling.  Gastrointestinal: Negative for abdominal pain, diarrhea and constipation.  Genitourinary: Negative for difficulty urinating.  Musculoskeletal: Negative for back pain and gait problem.  Skin: Positive for wound. Negative for rash.  Neurological: Negative for syncope, light-headedness and headaches.  All other systems reviewed and are negative.   Per HPI unless specifically indicated above     Medication List       This list is accurate as of: 08/17/15 12:31 PM.  Always use your most recent med list.               amLODipine 10 MG tablet  Commonly known as:  NORVASC  Take 1 tablet (10 mg total) by mouth daily.     aspirin EC 81 MG tablet  Take 1 tablet (81 mg total) by mouth daily.     clindamycin 300 MG capsule  Commonly known as:  CLEOCIN  Take 2 capsules (600 mg total) by mouth 3 (three) times daily.     cyanocobalamin 1000 MCG/ML injection  Commonly known as:  (VITAMIN B-12)  Inject 1 mL (1,000 mcg total) into the muscle once.     fenofibrate 145 MG tablet  Commonly known as:  TRICOR  Take 1 tablet (145 mg total) by mouth daily.     gabapentin 600 MG tablet  Commonly known as:  NEURONTIN  TAKE 2 TABLETS (1,200 MG TOTAL) BY MOUTH 3 (THREE) TIMES DAILY.     HYDROcodone-acetaminophen 5-325 MG tablet  Commonly known as:  NORCO/VICODIN  Take 1 tablet by mouth every 6 (six) hours as needed.     lisinopril-hydrochlorothiazide 20-25 MG tablet  Commonly known as:  PRINZIDE,ZESTORETIC  Take 1 tablet by mouth daily.           Objective:    BP 167/102 mmHg  Pulse 84  Temp(Src) 98 F (36.7 C) (Oral)  Ht  (1.778 m)  Wt 214 lb (97.07 kg)  BMI 30.71 kg/m2  Wt Readings from Last 3 Encounters:  08/17/15 214 lb (97.07 kg)  08/15/15 214 lb (97.07 kg)  08/08/15 200 lb (90.719 kg)    Physical Exam  Constitutional: He is oriented to person, place, and time. He appears well-developed and well-nourished. No distress.  Eyes: Conjunctivae and EOM are normal. Pupils are equal, round, and reactive to light. Right eye exhibits no discharge. No scleral icterus.  Cardiovascular: Normal rate, regular rhythm, normal heart sounds and intact distal pulses.   No murmur heard. Pulmonary/Chest: Effort normal and breath sounds normal. No respiratory distress. He has no wheezes.  Musculoskeletal: Normal range of motion. He exhibits no edema.  Neurological: He is alert and oriented to person, place, and time. Coordination normal.  Skin: Skin is warm and dry. No rash noted. He is not  diaphoretic.     Psychiatric: He has a normal mood and affect. His behavior is normal.  Vitals reviewed.   Results for orders placed or performed in visit on 06/19/15  Lipid panel  Result Value Ref Range   Cholesterol, Total 241 (H) 100 - 199 mg/dL   Triglycerides 409 (H) 0 - 149 mg/dL   HDL 36 (L) >81 mg/dL   VLDL Cholesterol Cal 69 (H) 5 - 40 mg/dL   LDL Calculated 191 (H) 0 - 99 mg/dL   Chol/HDL Ratio 6.7 (H) 0.0 - 5.0 ratio units      Assessment & Plan:   Problem List Items Addressed This Visit      Cardiovascular and Mediastinum   Benign hypertension - Primary    BP elevated, will monitor for now because of current pain and infection.      Relevant Medications   lisinopril-hydrochlorothiazide (PRINZIDE,ZESTORETIC) 20-25 MG tablet   fenofibrate (TRICOR) 145 MG tablet   amLODipine (NORVASC) 10 MG tablet     Musculoskeletal and Integument   Disruption of wound    Improved greatly, still open and has swelling in arms but swelling has reduced in hand      Relevant Orders   DG Forearm Left       Follow up plan: Return in about 4 days (around 08/21/2015), or if symptoms worsen or fail to improve, for Wound recheck.  Counseling provided for all of the vaccine components No orders of the defined types were placed in this encounter.    Arville Care, MD [facility] 08/17/2015, 12:31 PM

## 2015-08-18 ENCOUNTER — Ambulatory Visit (INDEPENDENT_AMBULATORY_CARE_PROVIDER_SITE_OTHER): Payer: BLUE CROSS/BLUE SHIELD | Admitting: Neurology

## 2015-08-18 ENCOUNTER — Encounter: Payer: Self-pay | Admitting: Neurology

## 2015-08-18 VITALS — BP 164/90 | HR 82 | Wt 211.2 lb

## 2015-08-18 DIAGNOSIS — M4806 Spinal stenosis, lumbar region: Secondary | ICD-10-CM | POA: Diagnosis not present

## 2015-08-18 DIAGNOSIS — G609 Hereditary and idiopathic neuropathy, unspecified: Secondary | ICD-10-CM | POA: Diagnosis not present

## 2015-08-18 DIAGNOSIS — R292 Abnormal reflex: Secondary | ICD-10-CM | POA: Diagnosis not present

## 2015-08-18 DIAGNOSIS — E538 Deficiency of other specified B group vitamins: Secondary | ICD-10-CM | POA: Diagnosis not present

## 2015-08-18 DIAGNOSIS — M48062 Spinal stenosis, lumbar region with neurogenic claudication: Secondary | ICD-10-CM
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
## 2015-08-18 NOTE — Patient Instructions (Addendum)
1.  MRI lumbar spine  2.  We will call you with the results  Return to clinic 4 months

## 2015-08-21 ENCOUNTER — Encounter: Payer: Self-pay | Admitting: Family Medicine

## 2015-08-21 ENCOUNTER — Ambulatory Visit (INDEPENDENT_AMBULATORY_CARE_PROVIDER_SITE_OTHER): Payer: BLUE CROSS/BLUE SHIELD | Admitting: Family Medicine

## 2015-08-21 VITALS — BP 169/101 | HR 88 | Temp 98.6°F | Ht 70.0 in | Wt 210.8 lb

## 2015-08-21 DIAGNOSIS — L03114 Cellulitis of left upper limb: Secondary | ICD-10-CM

## 2015-08-21 DIAGNOSIS — I10 Essential (primary) hypertension: Secondary | ICD-10-CM | POA: Diagnosis not present

## 2015-08-21 DIAGNOSIS — T8130XD Disruption of wound, unspecified, subsequent encounter: Secondary | ICD-10-CM | POA: Diagnosis not present

## 2015-08-21 MED ORDER — CLINDAMYCIN HCL 300 MG PO CAPS: 300.0000 mg | ORAL_CAPSULE | Freq: Three times a day (TID) | ORAL | Status: DC

## 2015-08-21 MED ORDER — HYDROCODONE-ACETAMINOPHEN 5-325 MG PO TABS
1.0000 | ORAL_TABLET | Freq: Four times a day (QID) | ORAL | Status: DC | PRN
Start: 2015-08-21 — End: 2015-09-20

## 2015-08-21 NOTE — Assessment & Plan Note (Signed)
Much improved continue wound care and antibiotic and pain meds

## 2015-08-21 NOTE — Assessment & Plan Note (Signed)
Continue to monitor as patient is still in significant amount of pain from wound site. Refill pain medications 

## 2015-08-21 NOTE — Assessment & Plan Note (Deleted)
Continue to monitor as patient is still in significant amount of pain from wound site. Refill pain medications

## 2015-08-21 NOTE — Progress Notes (Signed)
BP 169/101 mmHg  Pulse 88  Temp(Src) 98.6 F (37 C) (Oral)  Ht  (1.778 m)  Wt 210 lb 12.8 oz (95.618 kg)  BMI 30.25 kg/m2   Subjective:    Patient ID: Mathew Brady, male    DOB: 1965/12/14, 49 y.o.   MRN: [id number]  HPI: Mathew Brady is a 50 y.o. male presenting on 08/21/2015 for Followup wound of left arm   HPI Wound recheck Patient comes in today for a wound recheck on his anterior forearm. The swelling has significantly reduced on that forearm. He is still having drainage and the drainage is serosanguineous and a little bit of purulence. He denies any fevers or chills. The erythema is almost gone. The swelling is down to about 5 cm surrounding the actual wound site. He has full range of motion and sensation in his hand and fingers and blood flow was intact as well  Relevant past medical, surgical, family and social history reviewed and updated as indicated. Interim medical history since our last visit reviewed. Allergies and medications reviewed and updated.  Review of Systems  Constitutional: Negative for fever and chills.  HENT: Negative for ear discharge and ear pain.   Eyes: Negative for discharge and visual disturbance.  Respiratory: Negative for shortness of breath and wheezing.   Cardiovascular: Negative for chest pain and leg swelling.  Gastrointestinal: Negative for abdominal pain, diarrhea and constipation.  Genitourinary: Negative for difficulty urinating.  Musculoskeletal: Negative for back pain and gait problem.  Skin: Positive for wound. Negative for color change and rash.  Neurological: Negative for syncope, light-headedness and headaches.  All other systems reviewed and are negative.   Per HPI unless specifically indicated above     Medication List       This list is accurate as of: 08/21/15  3:29 PM.  Always use your most recent med list.               amLODipine 10 MG tablet  Commonly known as:  NORVASC  Take 1 tablet (10 mg total) by mouth  daily.     aspirin EC 81 MG tablet  Take 1 tablet (81 mg total) by mouth daily.     clindamycin 300 MG capsule  Commonly known as:  CLEOCIN  Take 1 capsule (300 mg total) by mouth 3 (three) times daily.     cyanocobalamin 1000 MCG/ML injection  Commonly known as:  (VITAMIN B-12)  Inject 1 mL (1,000 mcg total) into the muscle once.     fenofibrate 145 MG tablet  Commonly known as:  TRICOR  Take 1 tablet (145 mg total) by mouth daily.     gabapentin 600 MG tablet  Commonly known as:  NEURONTIN  TAKE 2 TABLETS (1,200 MG TOTAL) BY MOUTH 3 (THREE) TIMES DAILY.     HYDROcodone-acetaminophen 5-325 MG tablet  Commonly known as:  NORCO/VICODIN  Take 1 tablet by mouth every 6 (six) hours as needed.     lisinopril-hydrochlorothiazide 20-25 MG tablet  Commonly known as:  PRINZIDE,ZESTORETIC  Take 1 tablet by mouth daily.     LYRICA 100 MG capsule  Generic drug:  pregabalin           Objective:    BP 169/101 mmHg  Pulse 88  Temp(Src) 98.6 F (37 C) (Oral)  Ht  (1.778 m)  Wt 210 lb 12.8 oz (95.618 kg)  BMI 30.25 kg/m2  Wt Readings from Last 3 Encounters:  08/21/15 210 lb 12.8 oz (  95.618 kg)  08/18/15 211 lb 4 oz (95.822 kg)  08/17/15 214 lb (97.07 kg)    Physical Exam  Constitutional: He is oriented to person, place, and time. He appears well-developed and well-nourished. No distress.  Eyes: Conjunctivae and EOM are normal. Pupils are equal, round, and reactive to light. Right eye exhibits no discharge. No scleral icterus.  Cardiovascular: Normal rate, regular rhythm, normal heart sounds and intact distal pulses.   No murmur heard. Pulmonary/Chest: Effort normal and breath sounds normal. No respiratory distress. He has no wheezes.  Musculoskeletal: Normal range of motion. He exhibits no edema.  Neurological: He is alert and oriented to person, place, and time. Coordination normal.  Skin: Skin is warm and dry. No rash noted. He is not diaphoretic.     Psychiatric:  He has a normal mood and affect. His behavior is normal.  Vitals reviewed.   Results for orders placed or performed in visit on 06/19/15  Lipid panel  Result Value Ref Range   Cholesterol, Total 241 (H) 100 - 199 mg/dL   Triglycerides 161 (H) 0 - 149 mg/dL   HDL 36 (L) >09 mg/dL   VLDL Cholesterol Cal 69 (H) 5 - 40 mg/dL   LDL Calculated 604 (H) 0 - 99 mg/dL   Chol/HDL Ratio 6.7 (H) 0.0 - 5.0 ratio units      Assessment & Plan:   Problem List Items Addressed This Visit      Cardiovascular and Mediastinum   Benign hypertension    Continue to monitor as patient is still in significant amount of pain from wound site. Refill pain medications        Musculoskeletal and Integument   Disruption of wound - Primary    Much improved continue wound care and antibiotic and pain meds      Relevant Medications   clindamycin (CLEOCIN) 300 MG capsule   HYDROcodone-acetaminophen (NORCO/VICODIN) 5-325 MG tablet    Other Visit Diagnoses    Cellulitis of left upper extremity        Relevant Medications    clindamycin (CLEOCIN) 300 MG capsule    HYDROcodone-acetaminophen (NORCO/VICODIN) 5-325 MG tablet        Follow up plan: Return in about 1 week (around 08/28/2015), or if symptoms worsen or fail to improve, for Follow-up wound.  Counseling provided for all of the vaccine components No orders of the defined types were placed in this encounter.    Arville Care, MD [facility] 08/21/2015, 3:29 PM

## 2015-08-28 ENCOUNTER — Other Ambulatory Visit: Payer: Self-pay

## 2015-08-28 ENCOUNTER — Encounter: Payer: Self-pay | Admitting: Family Medicine

## 2015-08-28 ENCOUNTER — Ambulatory Visit (INDEPENDENT_AMBULATORY_CARE_PROVIDER_SITE_OTHER): Payer: BLUE CROSS/BLUE SHIELD | Admitting: Family Medicine

## 2015-08-28 ENCOUNTER — Ambulatory Visit: Payer: BLUE CROSS/BLUE SHIELD | Admitting: Family Medicine

## 2015-08-28 VITALS — BP 143/94 | HR 95 | Temp 98.6°F | Wt 210.4 lb

## 2015-08-28 DIAGNOSIS — L03114 Cellulitis of left upper limb: Secondary | ICD-10-CM

## 2015-08-28 DIAGNOSIS — T8130XD Disruption of wound, unspecified, subsequent encounter: Secondary | ICD-10-CM | POA: Diagnosis not present

## 2015-08-28 DIAGNOSIS — J209 Acute bronchitis, unspecified: Secondary | ICD-10-CM | POA: Diagnosis not present

## 2015-08-28 MED ORDER — PREDNISONE 20 MG PO TABS: ORAL_TABLET | ORAL | Status: DC

## 2015-08-28 NOTE — Progress Notes (Signed)
BP 143/94 mmHg  Pulse 95  Temp(Src) 98.6 F (37 C) (Oral)  Wt 210 lb 6.4 oz (95.437 kg)   Subjective:    Patient ID: Mathew Brady, male    DOB: 01-17-1966, 50 y.o.   MRN: [id number]  HPI: Mathew Brady is a 50 y.o. male presenting on 08/28/2015 for Wound Check; URI; Sinusitis; and Cough   HPI Wound recheck Patient comes in here today for a wound care recheck. He had a little drainage a day or 2 ago but since then it is been doing a lot better. He says the pain is very minimal at this point. He denies any fevers or chills. There is no redness or warmth to the side anymore. He still has some swelling and pain with grip and movement of the underlying musculature but it is improving.  Sinus congestion and cough Patient comes in today as well for a sinus congestion and cough that has been going on for the past few days. He denies any fevers or chills. He has been having a productive cough of yellow-green sputum. He is also been having some chest congestion but no shortness of breath or wheezing. He does admit that he has some postnasal drainage that is worse at night. His cough is also worse at night. He has tried some over-the-counter Mucinex and some Flonase and an antihistamine and they have been helping some.  Relevant past medical, surgical, family and social history reviewed and updated as indicated. Interim medical history since our last visit reviewed. Allergies and medications reviewed and updated.  Review of Systems  Constitutional: Negative for fever and chills.  HENT: Positive for congestion, postnasal drip, rhinorrhea, sinus pressure, sneezing and sore throat. Negative for ear discharge, ear pain and voice change.   Eyes: Negative for pain, discharge, redness and visual disturbance.  Respiratory: Positive for cough. Negative for chest tightness, shortness of breath and wheezing.   Cardiovascular: Negative for chest pain and leg swelling.  Gastrointestinal: Negative for abdominal  pain, diarrhea and constipation.  Genitourinary: Negative for difficulty urinating.  Musculoskeletal: Negative for back pain and gait problem.  Skin: Positive for wound. Negative for color change and rash.  Neurological: Negative for syncope, light-headedness and headaches.  All other systems reviewed and are negative.   Per HPI unless specifically indicated above     Medication List       This list is accurate as of: 08/28/15  3:13 PM.  Always use your most recent med list.               amLODipine 10 MG tablet  Commonly known as:  NORVASC  Take 1 tablet (10 mg total) by mouth daily.     aspirin EC 81 MG tablet  Take 1 tablet (81 mg total) by mouth daily.     clindamycin 300 MG capsule  Commonly known as:  CLEOCIN  Take 1 capsule (300 mg total) by mouth 3 (three) times daily.     cyanocobalamin 1000 MCG/ML injection  Commonly known as:  (VITAMIN B-12)  Inject 1 mL (1,000 mcg total) into the muscle once.     fenofibrate 145 MG tablet  Commonly known as:  TRICOR  Take 1 tablet (145 mg total) by mouth daily.     gabapentin 600 MG tablet  Commonly known as:  NEURONTIN  TAKE 2 TABLETS (1,200 MG TOTAL) BY MOUTH 3 (THREE) TIMES DAILY.     HYDROcodone-acetaminophen 5-325 MG tablet  Commonly known as:  NORCO/VICODIN  Take 1 tablet by mouth every 6 (six) hours as needed.     lisinopril-hydrochlorothiazide 20-25 MG tablet  Commonly known as:  PRINZIDE,ZESTORETIC  Take 1 tablet by mouth daily.     LYRICA 100 MG capsule  Generic drug:  pregabalin     predniSONE 20 MG tablet  Commonly known as:  DELTASONE  2 po at same time daily for 5 days           Objective:    BP 143/94 mmHg  Pulse 95  Temp(Src) 98.6 F (37 C) (Oral)  Wt 210 lb 6.4 oz (95.437 kg)  Wt Readings from Last 3 Encounters:  08/28/15 210 lb 6.4 oz (95.437 kg)  08/21/15 210 lb 12.8 oz (95.618 kg)  08/18/15 211 lb 4 oz (95.822 kg)    Physical Exam  Constitutional: He is oriented to person,  place, and time. He appears well-developed and well-nourished. No distress.  HENT:  Right Ear: Tympanic membrane, external ear and ear canal normal.  Left Ear: Tympanic membrane, external ear and ear canal normal.  Nose: Mucosal edema and rhinorrhea present. No sinus tenderness. No epistaxis. Right sinus exhibits maxillary sinus tenderness. Right sinus exhibits no frontal sinus tenderness. Left sinus exhibits maxillary sinus tenderness. Left sinus exhibits no frontal sinus tenderness.  Mouth/Throat: Uvula is midline and mucous membranes are normal. Posterior oropharyngeal edema and posterior oropharyngeal erythema present. No oropharyngeal exudate or tonsillar abscesses.  Eyes: Conjunctivae and EOM are normal. Pupils are equal, round, and reactive to light. Right eye exhibits no discharge. No scleral icterus.  Neck: Neck supple. No thyromegaly present.  Cardiovascular: Normal rate, regular rhythm, normal heart sounds and intact distal pulses.   No murmur heard. Pulmonary/Chest: Effort normal and breath sounds normal. No respiratory distress. He has no wheezes.  Musculoskeletal: Normal range of motion. He exhibits no edema.  Lymphadenopathy:    He has no cervical adenopathy.  Neurological: He is alert and oriented to person, place, and time. Coordination normal.  Skin: Skin is warm and dry. No rash noted. He is not diaphoretic.     Psychiatric: He has a normal mood and affect. His behavior is normal.  Vitals reviewed.   Results for orders placed or performed in visit on 06/19/15  Lipid panel  Result Value Ref Range   Cholesterol, Total 241 (H) 100 - 199 mg/dL   Triglycerides 742 (H) 0 - 149 mg/dL   HDL 36 (L) >59 mg/dL   VLDL Cholesterol Cal 69 (H) 5 - 40 mg/dL   LDL Calculated 563 (H) 0 - 99 mg/dL   Chol/HDL Ratio 6.7 (H) 0.0 - 5.0 ratio units      Assessment & Plan:   Problem List Items Addressed This Visit      Musculoskeletal and Integument   Disruption of wound - Primary      Other Visit Diagnoses    Acute bronchitis, unspecified organism        Relevant Medications    predniSONE (DELTASONE) 20 MG tablet       Follow up plan: Return in about 1 week (around 09/04/2015), or if symptoms worsen or fail to improve, for Follow-up wound care.  Counseling provided for all of the vaccine components No orders of the defined types were placed in this encounter.    Arville Care, MD [facility] 08/28/2015, 3:13 PM

## 2015-09-06 ENCOUNTER — Ambulatory Visit: Payer: BLUE CROSS/BLUE SHIELD | Admitting: Family Medicine

## 2015-09-07 ENCOUNTER — Encounter: Payer: Self-pay | Admitting: Family Medicine

## 2015-09-09 ENCOUNTER — Other Ambulatory Visit: Payer: Self-pay

## 2015-09-16 ENCOUNTER — Inpatient Hospital Stay: Admission: RE | Admit: 2015-09-16 | Payer: Self-pay | Source: Ambulatory Visit

## 2015-09-20 ENCOUNTER — Encounter: Payer: Self-pay | Admitting: Family Medicine

## 2015-09-20 ENCOUNTER — Ambulatory Visit (INDEPENDENT_AMBULATORY_CARE_PROVIDER_SITE_OTHER): Payer: BLUE CROSS/BLUE SHIELD | Admitting: Family Medicine

## 2015-09-20 VITALS — BP 138/82 | HR 87 | Temp 97.7°F | Ht 70.0 in | Wt 204.4 lb

## 2015-09-20 DIAGNOSIS — M5441 Lumbago with sciatica, right side: Secondary | ICD-10-CM

## 2015-09-20 DIAGNOSIS — I10 Essential (primary) hypertension: Secondary | ICD-10-CM | POA: Diagnosis not present

## 2015-09-20 MED ORDER — GABAPENTIN 600 MG PO TABS: ORAL_TABLET | ORAL | Status: DC

## 2015-09-20 MED ORDER — HYDROCODONE-ACETAMINOPHEN 5-325 MG PO TABS: 1.0000 | ORAL_TABLET | Freq: Four times a day (QID) | ORAL | Status: DC | PRN

## 2015-09-20 NOTE — Progress Notes (Signed)
BP 138/82 mmHg  Pulse 87  Temp(Src) 97.7 F (36.5 C) (Oral)  Ht 5\' 10"  (1.778 m)  Wt 204 lb 6.4 oz (92.715 kg)  BMI 29.33 kg/m2   Subjective:    Patient ID: Mathew Brady, male    DOB: 10/20/1965, 50 y.o.   MRN: [id number]  HPI: Mathew Brady is a 50 y.o. male presenting on 09/20/2015 for Followup left arm wound; Back Pain; and Medication Refill   HPI Back pain Patient has had an acute exacerbation of the chronic back pain that is bilateral lumbar in nature with sciatic pain going down his right leg. He denies any weakness or numbness. He is seen a neurologist for this and they are doing an MRI that is scheduled for this weekend. He is taking both gabapentin and Norco as needed for this.  Hypertension recheck Patient is currently on lisinopril-hydrochlorothiazide and blood pressure today is 138/82. Patient denies headaches, blurred vision, chest pains, shortness of breath, or weakness. Denies any side effects from medication and is content with current medication.   Relevant past medical, surgical, family and social history reviewed and updated as indicated. Interim medical history since our last visit reviewed. Allergies and medications reviewed and updated.  Review of Systems  Constitutional: Negative for fever and chills.  HENT: Negative for congestion, ear discharge and ear pain.   Eyes: Negative for discharge and visual disturbance.  Respiratory: Negative for shortness of breath and wheezing.   Cardiovascular: Negative for chest pain and leg swelling.  Gastrointestinal: Negative for abdominal pain, diarrhea and constipation.  Genitourinary: Negative for difficulty urinating.  Musculoskeletal: Positive for myalgias, back pain and arthralgias. Negative for gait problem.  Skin: Negative for rash.  Neurological: Negative for dizziness, syncope, weakness, light-headedness, numbness and headaches.  All other systems reviewed and are negative.   Per HPI unless specifically indicated  above     Medication List       This list is accurate as of: 09/20/15 11:38 AM.  Always use your most recent med list.               amLODipine 10 MG tablet  Commonly known as:  NORVASC  Take 1 tablet (10 mg total) by mouth daily.     aspirin EC 81 MG tablet  Take 1 tablet (81 mg total) by mouth daily.     cyanocobalamin 1000 MCG/ML injection  Commonly known as:  (VITAMIN B-12)  Inject 1 mL (1,000 mcg total) into the muscle once.     fenofibrate 145 MG tablet  Commonly known as:  TRICOR  Take 1 tablet (145 mg total) by mouth daily.     gabapentin 600 MG tablet  Commonly known as:  NEURONTIN  TAKE 2 TABLETS (1,200 MG TOTAL) BY MOUTH 3 (THREE) TIMES DAILY.     HYDROcodone-acetaminophen 5-325 MG tablet  Commonly known as:  NORCO/VICODIN  Take 1 tablet by mouth every 6 (six) hours as needed.     lisinopril-hydrochlorothiazide 20-25 MG tablet  Commonly known as:  PRINZIDE,ZESTORETIC  Take 1 tablet by mouth daily.     LYRICA 100 MG capsule  Generic drug:  pregabalin           Objective:    BP 138/82 mmHg  Pulse 87  Temp(Src) 97.7 F (36.5 C) (Oral)  Ht 5\' 10"  (1.778 m)  Wt 204 lb 6.4 oz (92.715 kg)  BMI 29.33 kg/m2  Wt Readings from Last 3 Encounters:  09/20/15 204 lb 6.4 oz (92.715 kg)  08/28/15 210 lb 6.4 oz (95.437 kg)  08/21/15 210 lb 12.8 oz (95.618 kg)    Physical Exam  Constitutional: He is oriented to person, place, and time. He appears well-developed and well-nourished. No distress.  Eyes: Conjunctivae and EOM are normal. Pupils are equal, round, and reactive to light. Right eye exhibits no discharge. No scleral icterus.  Neck: Neck supple. No thyromegaly present.  Cardiovascular: Normal rate, regular rhythm, normal heart sounds and intact distal pulses.   No murmur heard. Pulmonary/Chest: Effort normal and breath sounds normal. No respiratory distress. He has no wheezes.  Musculoskeletal: Normal range of motion. He exhibits no edema.    Lymphadenopathy:    He has no cervical adenopathy.  Neurological: He is alert and oriented to person, place, and time. Coordination normal.  Skin: Skin is warm and dry. No rash noted. He is not diaphoretic.  Psychiatric: He has a normal mood and affect. His behavior is normal.  Vitals reviewed.     Assessment & Plan:   Problem List Items Addressed This Visit      Cardiovascular and Mediastinum   Benign hypertension - Primary    Other Visit Diagnoses    Bilateral low back pain with right-sided sciatica        Has MRI scheduled with orthopedic, will give a little bit more Norco to bridge through the MRI and see if they can find what the issue is.    Relevant Medications    gabapentin (NEURONTIN) 600 MG tablet    HYDROcodone-acetaminophen (NORCO/VICODIN) 5-325 MG tablet       Follow up plan: Return in about 3 months (around 12/21/2015), or if symptoms worsen or fail to improve, for htn recheck.  Counseling provided for all of the vaccine components No orders of the defined types were placed in this encounter.    Arville Care, MD [facility] 09/20/2015, 11:38 AM

## 2015-09-23 ENCOUNTER — Ambulatory Visit
Admission: RE | Admit: 2015-09-23 | Discharge: 2015-09-23 | Disposition: A | Discharge status: Home / Self Care | Payer: BLUE CROSS/BLUE SHIELD | Source: Ambulatory Visit | Attending: Neurology | Admitting: Neurology

## 2015-09-23 DIAGNOSIS — G609 Hereditary and idiopathic neuropathy, unspecified: Secondary | ICD-10-CM

## 2015-10-03 ENCOUNTER — Telehealth: Payer: Self-pay | Admitting: Neurology

## 2015-10-03 NOTE — Telephone Encounter (Signed)
I spoke with patient's wife and informed her that I would re-send the referral.

## 2015-10-03 NOTE — Telephone Encounter (Signed)
Pt wife called and was wondering about when PT would be set up for her husband. Please call back.

## 2015-11-16 DIAGNOSIS — M47817 Spondylosis without myelopathy or radiculopathy, lumbosacral region: Secondary | ICD-10-CM | POA: Diagnosis not present

## 2015-11-27 DIAGNOSIS — Z79899 Other long term (current) drug therapy: Secondary | ICD-10-CM | POA: Diagnosis not present

## 2015-11-27 DIAGNOSIS — G894 Chronic pain syndrome: Secondary | ICD-10-CM | POA: Diagnosis not present

## 2015-11-27 DIAGNOSIS — Z79891 Long term (current) use of opiate analgesic: Secondary | ICD-10-CM | POA: Diagnosis not present

## 2015-11-27 DIAGNOSIS — M47817 Spondylosis without myelopathy or radiculopathy, lumbosacral region: Secondary | ICD-10-CM | POA: Diagnosis not present

## 2015-11-27 DIAGNOSIS — M79606 Pain in leg, unspecified: Secondary | ICD-10-CM | POA: Diagnosis not present

## 2015-11-27 DIAGNOSIS — M5137 Other intervertebral disc degeneration, lumbosacral region: Secondary | ICD-10-CM | POA: Diagnosis not present

## 2015-12-21 ENCOUNTER — Encounter: Payer: Self-pay | Admitting: Family Medicine

## 2015-12-21 ENCOUNTER — Ambulatory Visit: Payer: Self-pay | Admitting: Neurology

## 2015-12-21 ENCOUNTER — Ambulatory Visit (INDEPENDENT_AMBULATORY_CARE_PROVIDER_SITE_OTHER): Payer: BLUE CROSS/BLUE SHIELD | Admitting: Family Medicine

## 2015-12-21 VITALS — BP 154/83 | HR 88 | Temp 98.4°F | Ht 70.0 in | Wt 202.6 lb

## 2015-12-21 DIAGNOSIS — N400 Enlarged prostate without lower urinary tract symptoms: Secondary | ICD-10-CM

## 2015-12-21 DIAGNOSIS — I10 Essential (primary) hypertension: Secondary | ICD-10-CM | POA: Diagnosis not present

## 2015-12-21 DIAGNOSIS — E785 Hyperlipidemia, unspecified: Secondary | ICD-10-CM

## 2015-12-21 DIAGNOSIS — M797 Fibromyalgia: Secondary | ICD-10-CM

## 2015-12-21 MED ORDER — GABAPENTIN 600 MG PO TABS: ORAL_TABLET | ORAL | Status: DC

## 2015-12-21 NOTE — Progress Notes (Signed)
BP 154/83 mmHg  Pulse 88  Temp(Src) 98.4 F (36.9 C) (Oral)  Ht 5' 10" (1.778 m)  Wt 202 lb 9.6 oz (91.899 kg)  BMI 29.07 kg/m2   Subjective:    Patient ID: Mathew Brady, male    DOB: [birth year], 50 y.o.   MRN: [id number]  HPI: Mathew Brady is a 50 y.o. male presenting on 12/21/2015 for Hypertension   HPI Hypertension recheck Patient is coming in today for hypertension recheck. His blood pressure today is 154/83. He says it fluctuates around there or lower typically at home. He says it's a little high today because of pain. Patient denies headaches, blurred vision, chest pains, shortness of breath, or weakness. Denies any side effects from medication and is content with current medication.   Hyperlipidemia Patient is coming in today for a cholesterol recheck. Currently he is on TriCor. He denies any side effects from medication. He has not really changed his diet much. We will see where he is running again today. Ref  Fibromyalgia Patient is seen pain management now for his back pain and fibromyalgia and they have started him on some higher dose narcotics. Patient says the gabapentin has been helping him greatly as well and would like a refill of gabapentin today. He says it helps with the burning in pain sensation that he gets in his feet and lower legs. He has seen neurology for testing and they did not find any nerve issues.  Urinary stream issues Patient has been having some difficulty starting his stream and difficulty maintaining his stream. His flow is also been very light. He also says when he "masturbates" he has trouble having ejaculation. He is able to maintain an erection but cannot ejaculate. He denies any urinary burning or pain when he urinates. He denies any fevers or chills or flank pain. He does have a significant family history of prostate issues including prostate cancer.  Relevant past medical, surgical, family and social history reviewed and updated as indicated.  Interim medical history since our last visit reviewed. Allergies and medications reviewed and updated.  Review of Systems  Constitutional: Negative for fever and chills.  HENT: Negative for congestion, ear discharge and ear pain.   Eyes: Negative for discharge and visual disturbance.  Respiratory: Negative for shortness of breath and wheezing.   Cardiovascular: Negative for chest pain and leg swelling.  Gastrointestinal: Negative for abdominal pain, diarrhea and constipation.  Genitourinary: Positive for decreased urine volume. Negative for dysuria and difficulty urinating.  Musculoskeletal: Positive for myalgias, back pain and arthralgias. Negative for gait problem.  Skin: Negative for rash.  Neurological: Negative for dizziness, syncope, weakness, light-headedness, numbness and headaches.  All other systems reviewed and are negative.   Per HPI unless specifically indicated above     Medication List       This list is accurate as of: 12/21/15  2:03 PM.  Always use your most recent med list.               amLODipine 10 MG tablet  Commonly known as:  NORVASC  Take 1 tablet (10 mg total) by mouth daily.     aspirin EC 81 MG tablet  Take 1 tablet (81 mg total) by mouth daily.     baclofen 10 MG tablet  Commonly known as:  LIORESAL  Take 1 tablet by mouth 2 (two) times daily.     cyanocobalamin 1000 MCG/ML injection  Commonly known as:  (VITAMIN B-12)  Inject 1  mL (1,000 mcg total) into the muscle once.     EMBEDA 30-1.2 MG Cpcr  Generic drug:  Morphine-Naltrexone     fenofibrate 145 MG tablet  Commonly known as:  TRICOR  Take 1 tablet (145 mg total) by mouth daily.     gabapentin 600 MG tablet  Commonly known as:  NEURONTIN  TAKE 2 TABLETS (1,200 MG TOTAL) BY MOUTH 3 (THREE) TIMES DAILY.     lisinopril-hydrochlorothiazide 20-25 MG tablet  Commonly known as:  PRINZIDE,ZESTORETIC  Take 1 tablet by mouth daily.     morphine 15 MG tablet  Commonly known as:  MSIR    TAKE 1 TABLET 3 TIMES A DAY AS NEEDED           Objective:    BP 154/83 mmHg  Pulse 88  Temp(Src) 98.4 F (36.9 C) (Oral)  Ht 5' 10" (1.778 m)  Wt 202 lb 9.6 oz (91.899 kg)  BMI 29.07 kg/m2  Wt Readings from Last 3 Encounters:  12/21/15 202 lb 9.6 oz (91.899 kg)  09/20/15 204 lb 6.4 oz (92.715 kg)  08/28/15 210 lb 6.4 oz (95.437 kg)    Physical Exam  Constitutional: He is oriented to person, place, and time. He appears well-developed and well-nourished. No distress.  Eyes: Conjunctivae and EOM are normal. Pupils are equal, round, and reactive to light. Right eye exhibits no discharge. No scleral icterus.  Neck: Neck supple. No thyromegaly present.  Cardiovascular: Normal rate, regular rhythm, normal heart sounds and intact distal pulses.   No murmur heard. Pulmonary/Chest: Effort normal and breath sounds normal. No respiratory distress. He has no wheezes.  Musculoskeletal: Normal range of motion. He exhibits tenderness (Bilateral lower back tenderness). He exhibits no edema.  Lymphadenopathy:    He has no cervical adenopathy.  Neurological: He is alert and oriented to person, place, and time. Coordination normal.  Skin: Skin is warm and dry. No rash noted. He is not diaphoretic.  Psychiatric: He has a normal mood and affect. His behavior is normal.  Vitals reviewed.     Assessment & Plan:   Problem List Items Addressed This Visit      Cardiovascular and Mediastinum   Benign hypertension - Primary   Relevant Orders   CMP14+EGFR     Musculoskeletal and Integument   Fibromyalgia   Relevant Medications   gabapentin (NEURONTIN) 600 MG tablet     Other   Hyperlipidemia LDL goal <130   Relevant Orders   Lipid panel    Other Visit Diagnoses    BPH (benign prostatic hyperplasia)        Patient has symptoms of BPH. Will check PSA and refer to urology    Relevant Orders    Ambulatory referral to Urology    PSA, total and free        Follow up plan: Return in  about 3 months (around 03/22/2016), or if symptoms worsen or fail to improve, for Recheck hypertension.  Counseling provided for all of the vaccine components Orders Placed This Encounter  Procedures  . CMP14+EGFR  . Lipid panel  . PSA, total and free  . Ambulatory referral to Urology    Caryl Pina, MD [facility] 12/21/2015, 2:03 PM

## 2015-12-22 LAB — LIPID PANEL
CHOLESTEROL TOTAL: 192 mg/dL (ref 100–199)
Chol/HDL Ratio: 5.6 ratio units — ABNORMAL HIGH (ref 0.0–5.0)
HDL: 34 mg/dL — AB (ref >39)
LDL CALC: 94 mg/dL (ref 0–99)
Triglycerides: 322 mg/dL — ABNORMAL HIGH (ref 0–149)
VLDL CHOLESTEROL CAL: 64 mg/dL — AB (ref 5–40)

## 2015-12-22 LAB — CMP14+EGFR
ALK PHOS: 88 IU/L (ref 39–117)
ALT: 18 IU/L (ref 0–44)
AST: 19 IU/L (ref 0–40)
Albumin/Globulin Ratio: 1.7 (ref 1.2–2.2)
Albumin: 4.5 g/dL (ref 3.5–5.5)
BILIRUBIN TOTAL: 0.2 mg/dL (ref 0.0–1.2)
BUN/Creatinine Ratio: 8 — ABNORMAL LOW (ref 9–20)
BUN: 7 mg/dL (ref 6–24)
CHLORIDE: 98 mmol/L (ref 96–106)
CO2: 22 mmol/L (ref 18–29)
CREATININE: 0.86 mg/dL (ref 0.76–1.27)
Calcium: 9.1 mg/dL (ref 8.7–10.2)
GFR calc Af Amer: 117 mL/min/{1.73_m2} (ref >59)
GFR calc non Af Amer: 101 mL/min/{1.73_m2} (ref >59)
GLUCOSE: 79 mg/dL (ref 65–99)
Globulin, Total: 2.6 g/dL (ref 1.5–4.5)
Potassium: 4.6 mmol/L (ref 3.5–5.2)
Sodium: 139 mmol/L (ref 134–144)
TOTAL PROTEIN: 7.1 g/dL (ref 6.0–8.5)

## 2015-12-22 LAB — PSA, TOTAL AND FREE
PSA, Free Pct: 27.5 %
PSA, Free: 0.11 ng/mL
Prostate Specific Ag, Serum: 0.4 ng/mL (ref 0.0–4.0)

## 2015-12-28 DIAGNOSIS — Z79899 Other long term (current) drug therapy: Secondary | ICD-10-CM | POA: Diagnosis not present

## 2015-12-28 DIAGNOSIS — M47817 Spondylosis without myelopathy or radiculopathy, lumbosacral region: Secondary | ICD-10-CM | POA: Diagnosis not present

## 2015-12-28 DIAGNOSIS — G894 Chronic pain syndrome: Secondary | ICD-10-CM | POA: Diagnosis not present

## 2015-12-28 DIAGNOSIS — Z79891 Long term (current) use of opiate analgesic: Secondary | ICD-10-CM | POA: Diagnosis not present

## 2016-01-03 ENCOUNTER — Telehealth: Payer: Self-pay | Admitting: Family Medicine

## 2016-01-03 NOTE — Telephone Encounter (Signed)
Noted in chart.

## 2016-01-25 DIAGNOSIS — M47817 Spondylosis without myelopathy or radiculopathy, lumbosacral region: Secondary | ICD-10-CM | POA: Diagnosis not present

## 2016-01-25 DIAGNOSIS — Z79891 Long term (current) use of opiate analgesic: Secondary | ICD-10-CM | POA: Diagnosis not present

## 2016-01-25 DIAGNOSIS — G894 Chronic pain syndrome: Secondary | ICD-10-CM | POA: Diagnosis not present

## 2016-01-25 DIAGNOSIS — M79606 Pain in leg, unspecified: Secondary | ICD-10-CM | POA: Diagnosis not present

## 2016-01-25 DIAGNOSIS — M5137 Other intervertebral disc degeneration, lumbosacral region: Secondary | ICD-10-CM | POA: Diagnosis not present

## 2016-01-25 DIAGNOSIS — Z79899 Other long term (current) drug therapy: Secondary | ICD-10-CM | POA: Diagnosis not present

## 2016-02-22 DIAGNOSIS — M545 Low back pain: Secondary | ICD-10-CM | POA: Diagnosis not present

## 2016-02-22 DIAGNOSIS — Z79891 Long term (current) use of opiate analgesic: Secondary | ICD-10-CM | POA: Diagnosis not present

## 2016-02-22 DIAGNOSIS — M47817 Spondylosis without myelopathy or radiculopathy, lumbosacral region: Secondary | ICD-10-CM | POA: Diagnosis not present

## 2016-02-22 DIAGNOSIS — G894 Chronic pain syndrome: Secondary | ICD-10-CM | POA: Diagnosis not present

## 2016-02-22 DIAGNOSIS — Z79899 Other long term (current) drug therapy: Secondary | ICD-10-CM | POA: Diagnosis not present

## 2016-02-22 DIAGNOSIS — M5137 Other intervertebral disc degeneration, lumbosacral region: Secondary | ICD-10-CM | POA: Diagnosis not present

## 2016-02-27 ENCOUNTER — Telehealth: Payer: Self-pay | Admitting: Family Medicine

## 2016-02-29 DIAGNOSIS — M79606 Pain in leg, unspecified: Secondary | ICD-10-CM | POA: Diagnosis not present

## 2016-02-29 DIAGNOSIS — M545 Low back pain: Secondary | ICD-10-CM | POA: Diagnosis not present

## 2016-02-29 DIAGNOSIS — M5137 Other intervertebral disc degeneration, lumbosacral region: Secondary | ICD-10-CM | POA: Diagnosis not present

## 2016-03-04 NOTE — Telephone Encounter (Signed)
Scheduled and appointment date/time sent in a letter. [place] not be seen sooner in [place]

## 2016-03-21 DIAGNOSIS — G894 Chronic pain syndrome: Secondary | ICD-10-CM | POA: Diagnosis not present

## 2016-03-21 DIAGNOSIS — Z79899 Other long term (current) drug therapy: Secondary | ICD-10-CM | POA: Diagnosis not present

## 2016-03-21 DIAGNOSIS — Z79891 Long term (current) use of opiate analgesic: Secondary | ICD-10-CM | POA: Diagnosis not present

## 2016-03-21 DIAGNOSIS — M5137 Other intervertebral disc degeneration, lumbosacral region: Secondary | ICD-10-CM | POA: Diagnosis not present

## 2016-03-21 DIAGNOSIS — M47817 Spondylosis without myelopathy or radiculopathy, lumbosacral region: Secondary | ICD-10-CM | POA: Diagnosis not present

## 2016-03-21 DIAGNOSIS — M79606 Pain in leg, unspecified: Secondary | ICD-10-CM | POA: Diagnosis not present

## 2016-03-25 ENCOUNTER — Ambulatory Visit (INDEPENDENT_AMBULATORY_CARE_PROVIDER_SITE_OTHER): Payer: BLUE CROSS/BLUE SHIELD | Admitting: Family Medicine

## 2016-03-25 ENCOUNTER — Encounter: Payer: Self-pay | Admitting: Family Medicine

## 2016-03-25 VITALS — BP 130/89 | HR 93 | Temp 98.5°F | Ht 70.0 in | Wt 200.5 lb

## 2016-03-25 DIAGNOSIS — E785 Hyperlipidemia, unspecified: Secondary | ICD-10-CM | POA: Diagnosis not present

## 2016-03-25 DIAGNOSIS — M797 Fibromyalgia: Secondary | ICD-10-CM

## 2016-03-25 DIAGNOSIS — F419 Anxiety disorder, unspecified: Secondary | ICD-10-CM

## 2016-03-25 DIAGNOSIS — I10 Essential (primary) hypertension: Secondary | ICD-10-CM | POA: Diagnosis not present

## 2016-03-25 DIAGNOSIS — Z1211 Encounter for screening for malignant neoplasm of colon: Secondary | ICD-10-CM | POA: Diagnosis not present

## 2016-03-25 MED ORDER — GABAPENTIN 600 MG PO TABS: ORAL_TABLET | ORAL | 3 refills | Status: DC

## 2016-03-25 MED ORDER — AMITRIPTYLINE HCL 25 MG PO TABS: 50.0000 mg | ORAL_TABLET | Freq: Every day | ORAL | 1 refills | Status: DC

## 2016-03-25 NOTE — Progress Notes (Signed)
BP 130/89   Pulse 93   Temp 98.5 F (36.9 C) (Oral)   Ht 5' 10" (1.778 m)   Wt 200 lb 8 oz (90.9 kg)   BMI 28.77 kg/m    Subjective:    Patient ID: Mathew Brady, male    DOB: 1965/07/24, 50 y.o.   MRN: [id number]  HPI: Mathew Brady is a 50 y.o. male presenting on 03/25/2016 for Hypertension (3 month followup) and Hyperlipidemia   HPI Hypertension recheck Patient is coming in for a blood pressure recheck today. His blood pressure is 130/89. He is currently taking Norvasc and lisinopril-hydrochlorothiazide. Patient denies headaches, blurred vision, chest pains, shortness of breath, or weakness. Denies any side effects from medication and is content with current medication.   Hyperlipidemia recheck Patient was given a fenofibrate because he had high triglycerides but says it caused him to have myalgias and he stopped taking it many months ago. He has been trying to do dietary changes and has lost a few pounds. He would like to continue to avoid medications but is coming in fasting so is ready to do labs today to see where he sat if he needs anything.  Fibromyalgia and anxiety Patient is coming in for recheck on his fibromyalgia and his anxiety. He was given Neurontin previously but it helps some. He is also been on Lyrica previously. He is also been on Remeron in the distant past. He feels like the Lyrica which is the most recent one does not really help much and it cost a lot. He wants something more to control his anxiety and that he has occasional panic attacks. He also has difficulty sleeping at night and has insomnia and on top of that he has the fibromyalgia and some neuropathy pains. He denies any feelings of depression he denies any suicidal ideations or thoughts of hurting himself.  Relevant past medical, surgical, family and social history reviewed and updated as indicated. Interim medical history since our last visit reviewed. Allergies and medications reviewed and  updated.  Review of Systems  Constitutional: Negative for chills and fever.  Eyes: Negative for discharge.  Respiratory: Negative for shortness of breath and wheezing.   Cardiovascular: Negative for chest pain and leg swelling.  Musculoskeletal: Negative for back pain and gait problem.  Skin: Negative for rash.  Psychiatric/Behavioral: Positive for decreased concentration and sleep disturbance. Negative for dysphoric mood, self-injury and suicidal ideas. The patient is nervous/anxious.   All other systems reviewed and are negative.   Per HPI unless specifically indicated above     Medication List       Accurate as of 03/25/16  3:35 PM. Always use your most recent med list.          amitriptyline 25 MG tablet Commonly known as:  ELAVIL Take 2 tablets (50 mg total) by mouth at bedtime.   amLODipine 10 MG tablet Commonly known as:  NORVASC Take 1 tablet (10 mg total) by mouth daily.   aspirin EC 81 MG tablet Take 1 tablet (81 mg total) by mouth daily.   baclofen 10 MG tablet Commonly known as:  LIORESAL Take 1 tablet by mouth 2 (two) times daily.   cyanocobalamin 1000 MCG/ML injection Commonly known as:  (VITAMIN B-12) Inject 1 mL (1,000 mcg total) into the muscle once.   EMBEDA 30-1.2 MG Cpcr Generic drug:  Morphine-Naltrexone   fenofibrate 145 MG tablet Commonly known as:  TRICOR Take 1 tablet (145 mg total) by mouth daily.  gabapentin 600 MG tablet Commonly known as:  NEURONTIN TAKE 2 TABLETS (1,200 MG TOTAL) BY MOUTH 3 (THREE) TIMES DAILY.   lisinopril-hydrochlorothiazide 20-25 MG tablet Commonly known as:  PRINZIDE,ZESTORETIC Take 1 tablet by mouth daily.   morphine 15 MG tablet Commonly known as:  MSIR TAKE 1 TABLET 3 TIMES A DAY AS NEEDED          Objective:    BP 130/89   Pulse 93   Temp 98.5 F (36.9 C) (Oral)   Ht 5' 10" (1.778 m)   Wt 200 lb 8 oz (90.9 kg)   BMI 28.77 kg/m   Wt Readings from Last 3 Encounters:  03/25/16 200 lb 8 oz  (90.9 kg)  12/21/15 202 lb 9.6 oz (91.9 kg)  09/20/15 204 lb 6.4 oz (92.7 kg)    Physical Exam  Constitutional: He is oriented to person, place, and time. He appears well-developed and well-nourished. No distress.  Eyes: Conjunctivae are normal. Right eye exhibits no discharge. No scleral icterus.  Neck: Neck supple. No thyromegaly present.  Cardiovascular: Normal rate, regular rhythm, normal heart sounds and intact distal pulses.   No murmur heard. Pulmonary/Chest: Effort normal and breath sounds normal. No respiratory distress. He has no wheezes.  Musculoskeletal: Normal range of motion. He exhibits no edema.  Lymphadenopathy:    He has no cervical adenopathy.  Neurological: He is alert and oriented to person, place, and time. Coordination normal.  Skin: Skin is warm and dry. No rash noted. He is not diaphoretic.  Psychiatric: His behavior is normal. Judgment and thought content normal. His mood appears anxious. He does not exhibit a depressed mood. He expresses no suicidal ideation. He expresses no suicidal plans.  Nursing note and vitals reviewed.   Results for orders placed or performed in visit on 12/21/15  CMP14+EGFR  Result Value Ref Range   Glucose 79 65 - 99 mg/dL   BUN 7 6 - 24 mg/dL   Creatinine, Ser 0.86 0.76 - 1.27 mg/dL   GFR calc non Af Amer 101 >59 mL/min/1.73   GFR calc Af Amer 117 >59 mL/min/1.73   BUN/Creatinine Ratio 8 (L) 9 - 20   Sodium 139 134 - 144 mmol/L   Potassium 4.6 3.5 - 5.2 mmol/L   Chloride 98 96 - 106 mmol/L   CO2 22 18 - 29 mmol/L   Calcium 9.1 8.7 - 10.2 mg/dL   Total Protein 7.1 6.0 - 8.5 g/dL   Albumin 4.5 3.5 - 5.5 g/dL   Globulin, Total 2.6 1.5 - 4.5 g/dL   Albumin/Globulin Ratio 1.7 1.2 - 2.2   Bilirubin Total 0.2 0.0 - 1.2 mg/dL   Alkaline Phosphatase 88 39 - 117 IU/L   AST 19 0 - 40 IU/L   ALT 18 0 - 44 IU/L  Lipid panel  Result Value Ref Range   Cholesterol, Total 192 100 - 199 mg/dL   Triglycerides 322 (H) 0 - 149 mg/dL   HDL  34 (L) >39 mg/dL   VLDL Cholesterol Cal 64 (H) 5 - 40 mg/dL   LDL Calculated 94 0 - 99 mg/dL   Chol/HDL Ratio 5.6 (H) 0.0 - 5.0 ratio units  PSA, total and free  Result Value Ref Range   Prostate Specific Ag, Serum 0.4 0.0 - 4.0 ng/mL   PSA, Free 0.11 N/A ng/mL   PSA, Free Pct 27.5 %      Assessment & Plan:   Problem List Items Addressed This Visit  Cardiovascular and Mediastinum   Benign hypertension - Primary     Musculoskeletal and Integument   Fibromyalgia   Relevant Medications   gabapentin (NEURONTIN) 600 MG tablet     Other   Anxiety   Relevant Medications   amitriptyline (ELAVIL) 25 MG tablet   Hyperlipidemia LDL goal <130    Other Visit Diagnoses    Special screening for malignant neoplasms, colon       Relevant Orders   Ambulatory referral to Gastroenterology       Follow up plan: Return in about 4 weeks (around 04/22/2016), or if symptoms worsen or fail to improve, for Recheck anxiety and sleep.  Counseling provided for all of the vaccine components No orders of the defined types were placed in this encounter.   Caryl Pina, MD [facility] 03/25/2016, 3:35 PM

## 2016-03-28 DIAGNOSIS — M47817 Spondylosis without myelopathy or radiculopathy, lumbosacral region: Secondary | ICD-10-CM | POA: Diagnosis not present

## 2016-03-28 DIAGNOSIS — M545 Low back pain: Secondary | ICD-10-CM | POA: Diagnosis not present

## 2016-03-28 DIAGNOSIS — M79606 Pain in leg, unspecified: Secondary | ICD-10-CM | POA: Diagnosis not present

## 2016-03-28 DIAGNOSIS — M5137 Other intervertebral disc degeneration, lumbosacral region: Secondary | ICD-10-CM | POA: Diagnosis not present

## 2016-04-16 ENCOUNTER — Ambulatory Visit (INDEPENDENT_AMBULATORY_CARE_PROVIDER_SITE_OTHER): Payer: BLUE CROSS/BLUE SHIELD | Admitting: Urology

## 2016-04-16 DIAGNOSIS — N5319 Other ejaculatory dysfunction: Secondary | ICD-10-CM

## 2016-04-16 DIAGNOSIS — A630 Anogenital (venereal) warts: Secondary | ICD-10-CM | POA: Diagnosis not present

## 2016-04-16 DIAGNOSIS — N401 Enlarged prostate with lower urinary tract symptoms: Secondary | ICD-10-CM | POA: Diagnosis not present

## 2016-04-18 DIAGNOSIS — Z79899 Other long term (current) drug therapy: Secondary | ICD-10-CM | POA: Diagnosis not present

## 2016-04-18 DIAGNOSIS — M5137 Other intervertebral disc degeneration, lumbosacral region: Secondary | ICD-10-CM | POA: Diagnosis not present

## 2016-04-18 DIAGNOSIS — Z79891 Long term (current) use of opiate analgesic: Secondary | ICD-10-CM | POA: Diagnosis not present

## 2016-04-18 DIAGNOSIS — M545 Low back pain: Secondary | ICD-10-CM | POA: Diagnosis not present

## 2016-04-18 DIAGNOSIS — G894 Chronic pain syndrome: Secondary | ICD-10-CM | POA: Diagnosis not present

## 2016-04-18 DIAGNOSIS — M47817 Spondylosis without myelopathy or radiculopathy, lumbosacral region: Secondary | ICD-10-CM | POA: Diagnosis not present

## 2016-04-19 ENCOUNTER — Ambulatory Visit: Payer: BLUE CROSS/BLUE SHIELD | Admitting: Family Medicine

## 2016-05-16 DIAGNOSIS — G894 Chronic pain syndrome: Secondary | ICD-10-CM | POA: Diagnosis not present

## 2016-05-16 DIAGNOSIS — M5137 Other intervertebral disc degeneration, lumbosacral region: Secondary | ICD-10-CM | POA: Diagnosis not present

## 2016-05-16 DIAGNOSIS — M545 Low back pain: Secondary | ICD-10-CM | POA: Diagnosis not present

## 2016-05-16 DIAGNOSIS — Z79891 Long term (current) use of opiate analgesic: Secondary | ICD-10-CM | POA: Diagnosis not present

## 2016-05-16 DIAGNOSIS — M47817 Spondylosis without myelopathy or radiculopathy, lumbosacral region: Secondary | ICD-10-CM | POA: Diagnosis not present

## 2016-05-16 DIAGNOSIS — Z79899 Other long term (current) drug therapy: Secondary | ICD-10-CM | POA: Diagnosis not present

## 2016-06-04 ENCOUNTER — Ambulatory Visit (INDEPENDENT_AMBULATORY_CARE_PROVIDER_SITE_OTHER): Payer: BLUE CROSS/BLUE SHIELD | Admitting: Orthopaedic Surgery

## 2016-06-04 ENCOUNTER — Ambulatory Visit (INDEPENDENT_AMBULATORY_CARE_PROVIDER_SITE_OTHER): Payer: Self-pay

## 2016-06-04 ENCOUNTER — Encounter (INDEPENDENT_AMBULATORY_CARE_PROVIDER_SITE_OTHER): Payer: Self-pay | Admitting: Orthopaedic Surgery

## 2016-06-04 VITALS — BP 146/93 | HR 88 | Ht 69.0 in | Wt 209.0 lb

## 2016-06-04 DIAGNOSIS — F1920 Other psychoactive substance dependence, uncomplicated: Secondary | ICD-10-CM

## 2016-06-04 DIAGNOSIS — M545 Low back pain, unspecified: Secondary | ICD-10-CM

## 2016-06-04 DIAGNOSIS — G8929 Other chronic pain: Secondary | ICD-10-CM | POA: Diagnosis not present

## 2016-06-04 DIAGNOSIS — M5441 Lumbago with sciatica, right side: Secondary | ICD-10-CM | POA: Diagnosis not present

## 2016-06-04 NOTE — Progress Notes (Signed)
Office Visit Note   Patient: Mathew CorollaJohn H Brady           Date of Birth: 05/29/1966           MRN: [id number] Visit Date: 06/04/2016              Requested by: Nils PyleJoshua A Dettinger, MD [address] [place], [place] [id number] PCP: Elige RadonJoshua A Dettinger, MD   Assessment & Plan: Visit Diagnoses:  1. Chronic low back pain without sciatica                              2. Chronic prescription narcotic use  Plan: Long discussion was held with the patient after review MRI and plain radiographs and exam. He has some mild borderline narrowing at L4-5 without nerve compression. No surgeries indicated. He is neurologically intact. I discussed with them I would recommend weaning off his pain medication and continuing his workout activities with his wife. I discussed with him I'd be happy to see him back in follow him. Long discussion was held with the patient concerning his multiple narcotic prescription in the past. We discussed the withdrawal symptoms in detail. His wife is present with him. She related the all the injections that he's had and his failure to improve. Follow-Up Instructions: No Follow-up on file.   Orders:  Orders Placed This Encounter  Procedures  . XR Lumbar Spine 2-3 Views   No orders of the defined types were placed in this encounter.     Procedures: No procedures performed   Clinical Data: No additional findings.   Subjective: Chief Complaint  Patient presents with  . Lower Back - Pain    Patient states that he has had low back pain for years.  He has been going to [facility] and getting injections with no relief. He states after the last injection, he was in bad shape for about a month.  He states that the pain radiates down his right leg all of the way to his great toe. It sometimes feels like he cannot step down on it. The pain feels like it goes all of the way to the bone. He states that he has been diagnosed with neuropathy and he takes Gabapentin for that.  He takes Hydrocodone 10/325 4 times a day and Hysingla that is extended release. He cannot remember name of that.   The patient is here with his wife. 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 They go to the gym together he's currently on the above Norco 10/325 4 tablets a day. Prior to that he was on morphine, , Embeda, for pain control. He had multiple epidural steroid injections and is in a pain clinic patient not worked in over a year. In addition he takes the Neurontin 3600 mg a day total, prescription computer med list reviewed shows Hysingla ER 40mg  prescribed on 05/19/2016. Patient also has been on baclofen. Patient denies associated bowel or bladder symptoms, no chills or fever Review of Systems  Constitutional: Negative for chills and diaphoresis.  HENT: Negative for ear discharge, ear pain and nosebleeds.   Eyes: Negative for discharge and visual disturbance.  Respiratory: Negative for cough, choking and shortness of breath.   Cardiovascular: Negative for chest pain and palpitations.  Gastrointestinal: Negative for abdominal distention and abdominal pain.  Endocrine: Negative for cold intolerance and heat intolerance.  Genitourinary: Negative for flank pain and  hematuria.  Musculoskeletal: Positive for back pain.  Skin: Negative for rash and wound.  Neurological: Negative for seizures and speech difficulty.  Hematological: Negative for adenopathy. Does not bruise/bleed easily.  Psychiatric/Behavioral: Negative for agitation and suicidal ideas.     Objective: Vital Signs: BP (!) 146/93   Pulse 88   Ht 5\' 9"  (1.753 m)   Wt 209 lb (94.8 kg)   BMI 30.86 kg/m   Physical Exam  Constitutional: He is oriented to person, place, and time. He appears well-developed and well-nourished.  HENT:  Head: Normocephalic and atraumatic.  Eyes: EOM are normal. Pupils are equal, round, and reactive to light.  Pupil small reactive to light  Neck: No tracheal deviation present.  No thyromegaly present.  Cardiovascular: Normal rate.   Pulmonary/Chest: Effort normal. He has no wheezes.  Abdominal: Soft. Bowel sounds are normal.  Musculoskeletal:  Patient's able to heel and toe walk balance is fair to good. Knee and ankle jerk 2+. Negative Faber. Normal hip range of motion negative logroll. Knee and ankle jerk are 1+ and symmetrical. Anterior tib EHL gastrocsoleus are normal no rash or expose skin distal pulses are 2+. He has tenderness with palpation of the lumbosacral junction in the midline. Pain radiates to his right buttocks.  Neurological: He is oriented to person, place, and time.  Alertness decreased secondary to polypharmacy  Skin: Skin is warm and dry. Capillary refill takes less than 2 seconds.  Psychiatric: He has a normal mood and affect. His behavior is normal. Judgment and thought content normal.    Ortho Exam patient is intact motor and sensory lower extremities. Full range of motion knees hips ankles.  Specialty Comments:  No specialty comments available.  Imaging: Xr Lumbar Spine 2-3 Views  Result Date: 06/04/2016 AP lateral lumbar spine x-rays obtained and reviewed with patient. This shows some mild narrowing at L3-4 with the small endplate spurs. Pelvis and hip joints are normal. Impression: Mild L3-4 disc degeneration. No spondylolisthesis no scoliosis  MR Lumbar Spine Wo Contrast (Accession [id number]) (Order [id number])  Imaging  Date: 09/23/2015 Department: [facility] AT [address] Released By: Leslie Dales Authorizing: Glendale Chard, DO  Exam Information   Status Exam Begun  Exam Ended   Final [99] 09/23/2015 3:11 PM 09/23/2015 3:52 PM  PACS Images   Show images for MR Lumbar Spine Wo Contrast  Study Result   CLINICAL DATA:  Low back pain. Burning and tingling sensation along the sole and dorsum of the feet.  EXAM: MRI LUMBAR SPINE WITHOUT CONTRAST  TECHNIQUE: Multiplanar, multisequence MR imaging of  the lumbar spine was performed. No intravenous contrast was administered.  COMPARISON:  04/20/2014  FINDINGS: The lowest lumbar type non-rib-bearing vertebra is labeled as L5. The conus medullaris appears normal. Conus level: L1.  No vertebral subluxation is observed. No significant vertebral marrow edema is identified.  No significant paravertebral abnormality.  Additional findings at individual levels are as follows:  L1-2:  Unremarkable.  L2-3:  No impingement.  Mild disc bulge.  L3-4:  No impingement.  Mild disc bulge.  L4-5: Borderline central narrowing of the thecal sac due to disc bulge.  L5-S1:  Unremarkable.  IMPRESSION: 1. There is lumbar degenerative disc disease at several levels, causing borderline central narrowing of the thecal sac at L4-5, but no overt impingement is identified.   Electronically Signed   By: Gaylyn Rong M.D.   On     PMFS History: Patient Active Problem List   Diagnosis  Date Noted  . Hyperlipidemia LDL goal <130 12/21/2015  . Neuropathy, peripheral (HCC) 06/19/2015  . Anemia 06/18/2012  . Substance induced mood disorder (HCC) 02/14/2012  . Polysubstance dependence (HCC) 02/12/2012  . Sleep difficulties 02/11/2012  . Anxiety 01/27/2012  . Benign hypertension 01/27/2012  . Left-sided weakness 01/27/2012  . Opiate dependence (HCC) 01/27/2012  . Benzodiazepine dependence (HCC) 01/27/2012  . Dental caries 01/27/2012  . TOBACCO DEPENDENCE 09/04/2006  . MIGRAINE, UNSPEC., W/O INTRACTABLE MIGRAINE 09/04/2006  . Fibromyalgia 09/04/2006   Past Medical History:  Diagnosis Date  . Acid reflux   . Anemia 06/18/2012  . Anxiety   . Arthritis   . Chest pain 06/17/2012   MI ruled out  . Chronic abdominal pain   . Chronic prescription benzodiazepine use   . COPD (chronic obstructive pulmonary disease) (HCC)   . Fibromyalgia   . Hypertension   . Opiate use   . Palpitations   . Prediabetes 06/18/2012  . Rash  06/18/2012   Query eczema    Family History  Problem Relation Age of Onset  . Fibromyalgia Mother   . Bladder Cancer Father [id number]  . Benign prostatic hyperplasia Father   . Healthy Sister   . Healthy Son     Past Surgical History:  Procedure Laterality Date  . ESOPHAGEAL DILATION  1993   Social History   Occupational History  . Not on file.   Social History Main Topics  . Smoking status: Former Smoker    Packs/day: 2.00    Years: 10.00    Types: Cigarettes    Quit date: 03/09/2015  . Smokeless tobacco: Never Used     Comment: pt vapes occasionally  . Alcohol use No  . Drug use: No  . Sexual activity: Yes    Partners: Female

## 2016-06-20 DIAGNOSIS — K640 First degree hemorrhoids: Secondary | ICD-10-CM | POA: Diagnosis not present

## 2016-06-20 DIAGNOSIS — Z1211 Encounter for screening for malignant neoplasm of colon: Secondary | ICD-10-CM | POA: Diagnosis not present

## 2016-06-20 DIAGNOSIS — D127 Benign neoplasm of rectosigmoid junction: Secondary | ICD-10-CM | POA: Diagnosis not present

## 2016-06-20 DIAGNOSIS — K635 Polyp of colon: Secondary | ICD-10-CM | POA: Diagnosis not present

## 2016-06-25 DIAGNOSIS — Z1211 Encounter for screening for malignant neoplasm of colon: Secondary | ICD-10-CM | POA: Diagnosis not present

## 2016-06-25 DIAGNOSIS — K635 Polyp of colon: Secondary | ICD-10-CM | POA: Diagnosis not present

## 2016-06-26 ENCOUNTER — Encounter: Payer: Self-pay | Admitting: Family Medicine

## 2016-06-26 ENCOUNTER — Ambulatory Visit (INDEPENDENT_AMBULATORY_CARE_PROVIDER_SITE_OTHER): Payer: BLUE CROSS/BLUE SHIELD | Admitting: Family Medicine

## 2016-06-26 VITALS — BP 158/101 | HR 92 | Temp 97.1°F | Ht 69.0 in | Wt 206.0 lb

## 2016-06-26 DIAGNOSIS — E785 Hyperlipidemia, unspecified: Secondary | ICD-10-CM | POA: Diagnosis not present

## 2016-06-26 DIAGNOSIS — M797 Fibromyalgia: Secondary | ICD-10-CM

## 2016-06-26 DIAGNOSIS — I10 Essential (primary) hypertension: Secondary | ICD-10-CM

## 2016-06-26 MED ORDER — GABAPENTIN 800 MG PO TABS: 800.0000 mg | ORAL_TABLET | Freq: Three times a day (TID) | ORAL | 1 refills | Status: DC

## 2016-06-26 NOTE — Progress Notes (Signed)
BP (!) 158/101   Pulse 92   Temp 97.1 F (36.2 C) (Oral)   Ht _0  (1.753 m)   Wt 206 lb (93.4 kg)   BMI 30.42 kg/m    Subjective:    Patient ID: Mathew Brady, male    DOB: October 29, 1965, 50 y.o.   MRN: [id number]  HPI: Mathew Brady is a 50 y.o. male presenting on 06/26/2016 for 3 month follow up   HPI Hypertension Patient comes in for blood pressure recheck today. His blood pressure is 158/101. He says that home he has been checking it quite consistently and has been running in the 130s and that it just comes up here in the office. He is currently on amlodipine and lisinopril hydrochlorothiazide and is taking both ends of both today. Patient denies headaches, blurred vision, chest pains, shortness of breath, or weakness. Denies any side effects from medication and is content with current medication.   Hyperlipidemia. Patient is coming in for a hyperlipidemia recheck today. He is fasting today. He is currently on fenofibrate because he was intolerant of statins. He denies any issues with this medication. He is fasting today and would like a recheck.  Fibromyalgia/chronic pain from his back Patient is currently on gabapentin and would like to go back down today 100 mg dose because he feels like it worked better than the 1200 that he is on currently. Otherwise he feels like it is pretty well controlled.  Relevant past medical, surgical, family and social history reviewed and updated as indicated. Interim medical history since our last visit reviewed. Allergies and medications reviewed and updated.  Review of Systems  Constitutional: Negative for chills and fever.  Eyes: Negative for discharge.  Respiratory: Negative for shortness of breath and wheezing.   Cardiovascular: Negative for chest pain and leg swelling.  Musculoskeletal: Positive for back pain. Negative for gait problem.  Skin: Negative for rash.  Neurological: Positive for numbness. Negative for dizziness and headaches.    All other systems reviewed and are negative.   Per HPI unless specifically indicated above      Objective:    BP (!) 158/101   Pulse 92   Temp 97.1 F (36.2 C) (Oral)   Ht _1  (1.753 m)   Wt 206 lb (93.4 kg)   BMI 30.42 kg/m   Wt Readings from Last 3 Encounters:  06/26/16 206 lb (93.4 kg)  06/04/16 209 lb (94.8 kg)  03/25/16 200 lb 8 oz (90.9 kg)    Physical Exam  Constitutional: He is oriented to person, place, and time. He appears well-developed and well-nourished. No distress.  Eyes: Conjunctivae are normal. Right eye exhibits no discharge. Left eye exhibits no discharge. No scleral icterus.  Cardiovascular: Normal rate, regular rhythm, normal heart sounds and intact distal pulses.   No murmur heard. Pulmonary/Chest: Effort normal and breath sounds normal. No respiratory distress. He has no wheezes. He has no rales.  Musculoskeletal: Normal range of motion. He exhibits tenderness (Chronic back pain with subjective neuropathy in his lower legs). He exhibits no edema.  Neurological: He is alert and oriented to person, place, and time. Coordination normal.  Skin: Skin is warm and dry. No rash noted. He is not diaphoretic.  Psychiatric: He has a normal mood and affect. His behavior is normal.  Nursing note and vitals reviewed.     Assessment & Plan:   Problem List Items Addressed This Visit      Cardiovascular and Mediastinum   Benign  hypertension - Primary   Relevant Orders   CMP14+EGFR     Other   Fibromyalgia   Relevant Medications   gabapentin (NEURONTIN) 800 MG tablet   Hyperlipidemia LDL goal <130   Relevant Orders   Lipid panel       Follow up plan: Return in about 3 months (around 09/24/2016), or if symptoms worsen or fail to improve, for Hypertension recheck.  Counseling provided for all of the vaccine components Orders Placed This Encounter  Procedures  . CMP14+EGFR  . Lipid panel    Caryl Pina, MD [facility] 06/26/2016, 4:28 PM

## 2016-07-02 ENCOUNTER — Ambulatory Visit: Payer: Self-pay | Admitting: Urology

## 2016-07-10 ENCOUNTER — Telehealth: Payer: Self-pay | Admitting: Family Medicine

## 2016-07-10 NOTE — Telephone Encounter (Signed)
Aware, script called in correctly.

## 2016-07-29 DIAGNOSIS — Z6831 Body mass index (BMI) 31.0-31.9, adult: Secondary | ICD-10-CM | POA: Diagnosis not present

## 2016-07-29 DIAGNOSIS — Z125 Encounter for screening for malignant neoplasm of prostate: Secondary | ICD-10-CM | POA: Diagnosis not present

## 2016-07-29 DIAGNOSIS — Z0000 Encounter for general adult medical examination without abnormal findings: Secondary | ICD-10-CM | POA: Diagnosis not present

## 2016-07-29 DIAGNOSIS — I10 Essential (primary) hypertension: Secondary | ICD-10-CM | POA: Diagnosis not present

## 2016-07-29 DIAGNOSIS — G8929 Other chronic pain: Secondary | ICD-10-CM | POA: Diagnosis not present

## 2016-08-01 ENCOUNTER — Telehealth: Payer: Self-pay | Admitting: *Deleted

## 2016-08-01 NOTE — Telephone Encounter (Signed)
LMOVM to remind pt to have labs drawn

## 2016-08-01 NOTE — Telephone Encounter (Signed)
-----   Message from Nils PyleJoshua A Dettinger, MD sent at 08/01/2016  7:51 AM EST ----- Patient did not come in and get his labs   ----- Message ----- From: SYSTEM Sent: 08/01/2016  12:07 AM To: Elige RadonJoshua A Dettinger, MD

## 2016-08-25 ENCOUNTER — Emergency Department (HOSPITAL_COMMUNITY): Payer: BLUE CROSS/BLUE SHIELD

## 2016-08-25 ENCOUNTER — Encounter (HOSPITAL_COMMUNITY): Payer: Self-pay

## 2016-08-25 ENCOUNTER — Observation Stay (HOSPITAL_COMMUNITY)
Admission: EM | Admit: 2016-08-25 | Discharge: 2016-08-25 | Discharge status: Against Medical Advice | Payer: BLUE CROSS/BLUE SHIELD | Attending: Internal Medicine | Admitting: Internal Medicine

## 2016-08-25 DIAGNOSIS — K219 Gastro-esophageal reflux disease without esophagitis: Secondary | ICD-10-CM | POA: Diagnosis not present

## 2016-08-25 DIAGNOSIS — F419 Anxiety disorder, unspecified: Secondary | ICD-10-CM | POA: Diagnosis not present

## 2016-08-25 DIAGNOSIS — D649 Anemia, unspecified: Secondary | ICD-10-CM | POA: Diagnosis not present

## 2016-08-25 DIAGNOSIS — J449 Chronic obstructive pulmonary disease, unspecified: Secondary | ICD-10-CM | POA: Insufficient documentation

## 2016-08-25 DIAGNOSIS — R002 Palpitations: Secondary | ICD-10-CM | POA: Diagnosis not present

## 2016-08-25 DIAGNOSIS — I110 Hypertensive heart disease with heart failure: Secondary | ICD-10-CM | POA: Insufficient documentation

## 2016-08-25 DIAGNOSIS — R079 Chest pain, unspecified: Principal | ICD-10-CM | POA: Diagnosis present

## 2016-08-25 DIAGNOSIS — Z8249 Family history of ischemic heart disease and other diseases of the circulatory system: Secondary | ICD-10-CM | POA: Diagnosis not present

## 2016-08-25 DIAGNOSIS — G894 Chronic pain syndrome: Secondary | ICD-10-CM | POA: Diagnosis not present

## 2016-08-25 DIAGNOSIS — R51 Headache: Secondary | ICD-10-CM

## 2016-08-25 DIAGNOSIS — K4090 Unilateral inguinal hernia, without obstruction or gangrene, not specified as recurrent: Secondary | ICD-10-CM | POA: Insufficient documentation

## 2016-08-25 DIAGNOSIS — G629 Polyneuropathy, unspecified: Secondary | ICD-10-CM | POA: Insufficient documentation

## 2016-08-25 DIAGNOSIS — M549 Dorsalgia, unspecified: Secondary | ICD-10-CM

## 2016-08-25 DIAGNOSIS — R202 Paresthesia of skin: Secondary | ICD-10-CM

## 2016-08-25 DIAGNOSIS — Z87891 Personal history of nicotine dependence: Secondary | ICD-10-CM | POA: Diagnosis not present

## 2016-08-25 DIAGNOSIS — R519 Headache, unspecified: Secondary | ICD-10-CM

## 2016-08-25 DIAGNOSIS — R2232 Localized swelling, mass and lump, left upper limb: Secondary | ICD-10-CM | POA: Diagnosis not present

## 2016-08-25 DIAGNOSIS — R29898 Other symptoms and signs involving the musculoskeletal system: Secondary | ICD-10-CM

## 2016-08-25 DIAGNOSIS — R2990 Unspecified symptoms and signs involving the nervous system: Secondary | ICD-10-CM | POA: Diagnosis not present

## 2016-08-25 DIAGNOSIS — M797 Fibromyalgia: Secondary | ICD-10-CM | POA: Diagnosis not present

## 2016-08-25 DIAGNOSIS — K429 Umbilical hernia without obstruction or gangrene: Secondary | ICD-10-CM | POA: Insufficient documentation

## 2016-08-25 DIAGNOSIS — I509 Heart failure, unspecified: Secondary | ICD-10-CM | POA: Diagnosis not present

## 2016-08-25 HISTORY — DX: Polyneuropathy, unspecified: G62.9

## 2016-08-25 LAB — HEPATIC FUNCTION PANEL
ALK PHOS: 73 U/L (ref 38–126)
ALT: 21 U/L (ref 17–63)
AST: 29 U/L (ref 15–41)
Albumin: 4.2 g/dL (ref 3.5–5.0)
BILIRUBIN INDIRECT: 0.7 mg/dL (ref 0.3–0.9)
Bilirubin, Direct: 0.4 mg/dL (ref 0.1–0.5)
TOTAL PROTEIN: 6.6 g/dL (ref 6.5–8.1)
Total Bilirubin: 1.1 mg/dL (ref 0.3–1.2)

## 2016-08-25 LAB — BASIC METABOLIC PANEL
Anion gap: 7 (ref 5–15)
BUN: 13 mg/dL (ref 6–20)
CO2: 27 mmol/L (ref 22–32)
Calcium: 9.1 mg/dL (ref 8.9–10.3)
Chloride: 105 mmol/L (ref 101–111)
Creatinine, Ser: 1.05 mg/dL (ref 0.61–1.24)
GFR calc Af Amer: >60 mL/min (ref >60)
GFR calc non Af Amer: >60 mL/min (ref >60)
GLUCOSE: 95 mg/dL (ref 65–99)
POTASSIUM: 4.3 mmol/L (ref 3.5–5.1)
Sodium: 139 mmol/L (ref 135–145)

## 2016-08-25 LAB — CBC WITH DIFFERENTIAL/PLATELET
BASOS ABS: 0 10*3/uL (ref 0.0–0.1)
Basophils Relative: 0 %
Eosinophils Absolute: 0.1 10*3/uL (ref 0.0–0.7)
Eosinophils Relative: 1 %
HEMATOCRIT: 42.1 % (ref 39.0–52.0)
Hemoglobin: 14.4 g/dL (ref 13.0–17.0)
LYMPHS PCT: 37 %
Lymphs Abs: 3 10*3/uL (ref 0.7–4.0)
MCH: 33 pg (ref 26.0–34.0)
MCHC: 34.2 g/dL (ref 30.0–36.0)
MCV: 96.3 fL (ref 78.0–100.0)
MONO ABS: 0.7 10*3/uL (ref 0.1–1.0)
MONOS PCT: 8 %
NEUTROS ABS: 4.3 10*3/uL (ref 1.7–7.7)
NEUTROS PCT: 54 %
Platelets: 179 10*3/uL (ref 150–400)
RBC: 4.37 MIL/uL (ref 4.22–5.81)
RDW: 12.4 % (ref 11.5–15.5)
WBC: 8.1 10*3/uL (ref 4.0–10.5)

## 2016-08-25 LAB — I-STAT TROPONIN, ED: Troponin i, poc: 0.01 ng/mL (ref 0.00–0.08)

## 2016-08-25 LAB — PROTIME-INR
INR: 0.97
Prothrombin Time: 12.9 seconds (ref 11.4–15.2)

## 2016-08-25 LAB — I-STAT CHEM 8, ED
BUN: 15 mg/dL (ref 6–20)
CREATININE: 1.1 mg/dL (ref 0.61–1.24)
Calcium, Ion: 1.09 mmol/L — ABNORMAL LOW (ref 1.15–1.40)
Chloride: 105 mmol/L (ref 101–111)
Glucose, Bld: 94 mg/dL (ref 65–99)
HEMATOCRIT: 41 % (ref 39.0–52.0)
HEMOGLOBIN: 13.9 g/dL (ref 13.0–17.0)
POTASSIUM: 4.7 mmol/L (ref 3.5–5.1)
SODIUM: 139 mmol/L (ref 135–145)
TCO2: 30 mmol/L (ref 0–100)

## 2016-08-25 LAB — APTT: APTT: 28 s (ref 24–36)

## 2016-08-25 MED ORDER — IOPAMIDOL (ISOVUE-370) INJECTION 76%
120.0000 mL | Freq: Once | INTRAVENOUS | Status: AC | PRN
  Administered 2016-08-25: 120 mL via INTRAVENOUS

## 2016-08-25 NOTE — ED Provider Notes (Signed)
[facility]Y DEPT Provider Note   CSN: [id number] Arrival date & time: 08/25/16  1748     History   Chief Complaint Chief Complaint  Patient presents with  . Chest Pain    HPI Mathew Brady is a 51 y.o. male.  The history is provided by the patient and the spouse. The history is limited by the condition of the patient (acuity of the pt).  Chest Pain      Pt was seen at 1800.  Per pt and his wife, c/o gradual onset and worsening of persistent multiple symptoms that began at 0300 this morning. Pt states he woke up at 0300 with his LUE "swollen" and feeling "numb and tingling." Pt's wife states she had to apply ice to pt's finger to take off his wedding band. Pt states he also had "mild" headache, left side shoulder and neck pain at that time. Pt states while he was in church approximately 1700/1715 he developed chest pain that worsened and radiated into his back between his shoulder blades, his neck and his left arm. Pt also states he developed a worsening headache and noticed his left leg was "weak" when he tried to walk out of church. Pt states he also "felt like I was going to pass out." Pt states since arrival to the ED, he has developed left eye "blurry vision." Pt does not feel his left arm feels weak. Denies abd pain, no SOB/cough, no palpitations, no N/V/D, no facial droop, no slurred speech.    Past Medical History:  Diagnosis Date  . Acid reflux   . Anemia 06/18/2012  . Anxiety   . Arthritis   . Chest pain 06/17/2012   MI ruled out  . Chronic abdominal pain   . Chronic prescription benzodiazepine use   . COPD (chronic obstructive pulmonary disease) (HCC)   . Fibromyalgia   . Hypertension   . Opiate use   . Palpitations   . Prediabetes 06/18/2012  . Rash 06/18/2012   Query eczema    Patient Active Problem List   Diagnosis Date Noted  . Hyperlipidemia LDL goal <130 12/21/2015  . Neuropathy, peripheral (HCC) 06/19/2015  . Anemia 06/18/2012  . Substance induced  mood disorder (HCC) 02/14/2012  . Polysubstance dependence (HCC) 02/12/2012  . Sleep difficulties 02/11/2012  . Anxiety 01/27/2012  . Benign hypertension 01/27/2012  . Left-sided weakness 01/27/2012  . Opiate dependence (HCC) 01/27/2012  . Benzodiazepine dependence (HCC) 01/27/2012  . Dental caries 01/27/2012  . TOBACCO DEPENDENCE 09/04/2006  . MIGRAINE, UNSPEC., W/O INTRACTABLE MIGRAINE 09/04/2006  . Fibromyalgia 09/04/2006    Past Surgical History:  Procedure Laterality Date  . ESOPHAGEAL DILATION  1993       Home Medications    Prior to Admission medications   Medication Sig Start Date End Date Taking? Authorizing Provider  amLODipine (NORVASC) 10 MG tablet Take 1 tablet (10 mg total) by mouth daily. 08/17/15   Elige Radon Dettinger, MD  aspirin EC 81 MG tablet Take 1 tablet (81 mg total) by mouth daily. 02/17/15   Elige Radon Dettinger, MD  cyanocobalamin (,VITAMIN B-12,) 1000 MCG/ML injection Inject 1 mL (1,000 mcg total) into the muscle once. 06/12/15   Donika Concha Se, DO  fenofibrate (TRICOR) 145 MG tablet Take 1 tablet (145 mg total) by mouth daily. 08/17/15   Elige Radon Dettinger, MD  gabapentin (NEURONTIN) 800 MG tablet Take 1 tablet (800 mg total) by mouth 3 (three) times daily. 06/26/16   Elige Radon Dettinger, MD  lisinopril-hydrochlorothiazide (  PRINZIDE,ZESTORETIC) 20-25 MG tablet Take 1 tablet by mouth daily. 08/17/15   Elige Radon Dettinger, MD    Family History Family History  Problem Relation Age of Onset  . Fibromyalgia Mother   . Bladder Cancer Father 66  . Benign prostatic hyperplasia Father   . Healthy Sister   . Healthy Son     Social History Social History  Substance Use Topics  . Smoking status: Former Smoker    Packs/day: 2.00    Years: 10.00    Types: Cigarettes    Quit date: 03/09/2015  . Smokeless tobacco: Never Used     Comment: pt vapes occasionally  . Alcohol use No     Allergies   Patient has no known allergies.   Review of Systems Review of  Systems  Unable to perform ROS: Acuity of condition  Cardiovascular: Positive for chest pain.     Physical Exam Updated Vital Signs BP 141/86 (BP Location: Left Arm)   Pulse 79   Temp 98 F (36.7 C) (Oral)   Resp 18   Ht 5\' 9"  (1.753 m)   Wt 202 lb (91.6 kg)   SpO2 96%   BMI 29.83 kg/m   Physical Exam 1805: Physical examination:  Nursing notes reviewed; Vital signs and O2 SAT reviewed;  Constitutional: Well developed, Well nourished, Well hydrated, In no acute distress; Head:  Normocephalic, atraumatic; Eyes: EOMI, PERRL, No scleral icterus; ENMT: Mouth and pharynx normal, Mucous membranes moist; Neck: Supple, Full range of motion, No lymphadenopathy; Cardiovascular: Regular rate and rhythm, No gallop; Respiratory: Breath sounds clear & equal bilaterally, No wheezes.  Speaking full sentences with ease, Normal respiratory effort/excursion; Chest: Nontender, Movement normal; Abdomen: Soft, Nontender, Nondistended, Normal bowel sounds; Genitourinary: No CVA tenderness; Spine:  No midline CS, TS, LS tenderness.;; Extremities: Pulses normal, No tenderness, No edema, No calf edema or asymmetry.; Neuro: AA&Ox3, Major CN grossly intact. Speech clear.  No facial droop. Grips equal. Strength 5/5 equal bilat UE's and RLE, strength 4/5 LLE.  +subjective decreased sensation left face, LUE and LLE. Normal cerebellar testing bilat UE's (finger-nose) and LE's (heel-shin)..; Skin: Color normal, Warm, Dry.   ED Treatments / Results  Labs (all labs ordered are listed, but only abnormal results are displayed)   EKG  EKG Interpretation  Date/Time:  Sunday August 25 2016 17:57:14 EST Ventricular Rate:  74 PR Interval:  144 QRS Duration: 96 QT Interval:  386 QTC Calculation: 428 R Axis:   -46 Text Interpretation:  Normal sinus rhythm Left anterior fascicular block Left axis deviation When compared with ECG of 04/20/2014 No significant change was found Confirmed by [facility]  MD, Nicholos Johns [phone]) on  08/25/2016 6:28:50 PM       Radiology   Procedures Procedures (including critical care time)  Medications Ordered in ED Medications - No data to display   Initial Impression / Assessment and Plan / ED Course  I have reviewed the triage vital signs and the nursing notes.  Pertinent labs & imaging results that were available during my care of the patient were reviewed by me and considered in my medical decision making (see chart for details).  MDM Reviewed: nursing note, vitals and previous chart Reviewed previous: ECG and labs Interpretation: labs, ECG, x-ray and CT scan Total time providing critical care: 30-74 minutes. This excludes time spent performing separately reportable procedures and services. Consults: neurology   CRITICAL CARE Performed by: Laray Anger Total critical care time: 35 minutes Critical care time was exclusive of separately billable  procedures and treating other patients. Critical care was necessary to treat or prevent imminent or life-threatening deterioration. Critical care was time spent personally by me on the following activities: development of treatment plan with patient and/or surrogate as well as nursing, discussions with consultants, evaluation of patient's response to treatment, examination of patient, obtaining history from patient or surrogate, ordering and performing treatments and interventions, ordering and review of laboratory studies, ordering and review of radiographic studies, pulse oximetry and re-evaluation of patient's condition.   Results for orders placed or performed during the hospital encounter of 08/25/16  Basic metabolic panel  Result Value Ref Range   Sodium 139 135 - 145 mmol/L   Potassium 4.3 3.5 - 5.1 mmol/L   Chloride 105 101 - 111 mmol/L   CO2 27 22 - 32 mmol/L   Glucose, Bld 95 65 - 99 mg/dL   BUN 13 6 - 20 mg/dL   Creatinine, Ser 1.61 0.61 - 1.24 mg/dL   Calcium 9.1 8.9 - 09.6 mg/dL   GFR calc non Af Amer  >60 >60 mL/min   GFR calc Af Amer >60 >60 mL/min   Anion gap 7 5 - 15  CBC with Differential/Platelet  Result Value Ref Range   WBC 8.1 4.0 - 10.5 K/uL   RBC 4.37 4.22 - 5.81 MIL/uL   Hemoglobin 14.4 13.0 - 17.0 g/dL   HCT 04.5 40.9 - 81.1 %   MCV 96.3 78.0 - 100.0 fL   MCH 33.0 26.0 - 34.0 pg   MCHC 34.2 30.0 - 36.0 g/dL   RDW 91.4 78.2 - 95.6 %   Platelets 179 150 - 400 K/uL   Neutrophils Relative % 54 %   Neutro Abs 4.3 1.7 - 7.7 K/uL   Lymphocytes Relative 37 %   Lymphs Abs 3.0 0.7 - 4.0 K/uL   Monocytes Relative 8 %   Monocytes Absolute 0.7 0.1 - 1.0 K/uL   Eosinophils Relative 1 %   Eosinophils Absolute 0.1 0.0 - 0.7 K/uL   Basophils Relative 0 %   Basophils Absolute 0.0 0.0 - 0.1 K/uL  Protime-INR  Result Value Ref Range   Prothrombin Time 12.9 11.4 - 15.2 seconds   INR 0.97   APTT  Result Value Ref Range   aPTT 28 24 - 36 seconds  Hepatic function panel  Result Value Ref Range   Total Protein 6.6 6.5 - 8.1 g/dL   Albumin 4.2 3.5 - 5.0 g/dL   AST 29 15 - 41 U/L   ALT 21 17 - 63 U/L   Alkaline Phosphatase 73 38 - 126 U/L   Total Bilirubin 1.1 0.3 - 1.2 mg/dL   Bilirubin, Direct 0.4 0.1 - 0.5 mg/dL   Indirect Bilirubin 0.7 0.3 - 0.9 mg/dL  I-stat troponin, ED  Result Value Ref Range   Troponin i, poc 0.01 0.00 - 0.08 ng/mL   Comment 3          I-stat Chem 8, ED  Result Value Ref Range   Sodium 139 135 - 145 mmol/L   Potassium 4.7 3.5 - 5.1 mmol/L   Chloride 105 101 - 111 mmol/L   BUN 15 6 - 20 mg/dL   Creatinine, Ser 2.13 0.61 - 1.24 mg/dL   Glucose, Bld 94 65 - 99 mg/dL   Calcium, Ion 0.86 (L) 1.15 - 1.40 mmol/L   TCO2 30 0 - 100 mmol/L   Hemoglobin 13.9 13.0 - 17.0 g/dL   HCT 57.8 46.9 - 62.9 %   Dg  Chest 2 View Result Date: 08/25/2016 CLINICAL DATA:  Patient with chest pain radiating to the back. EXAM: CHEST  2 VIEW COMPARISON:  Chest radiograph 03/22/2015. FINDINGS: Stable cardiac and mediastinal contours. No consolidative pulmonary opacities. There  is a 1.2 cm nodular opacity within the left lower lung. Thoracic spine degenerative changes. IMPRESSION: 1.2 cm nodular opacity within the left lower lung. This needs dedicated evaluation with chest CT. No acute cardiopulmonary process. Electronically Signed   By: Annia Beltrew  Davis M.D.   On: 08/25/2016 18:33   Ct Head Wo Contrast Result Date: 08/25/2016 CLINICAL DATA:  Sudden onset of headache with blurry vision EXAM: CT HEAD WITHOUT CONTRAST TECHNIQUE: Contiguous axial images were obtained from the base of the skull through the vertex without intravenous contrast. COMPARISON:  03/22/2015 FINDINGS: Brain: No evidence of acute infarction, hemorrhage, hydrocephalus, extra-axial collection or mass lesion/mass effect. Vascular: No hyperdense vessel or unexpected calcification. Skull: Normal. Negative for fracture or focal lesion. Sinuses/Orbits: Mild mucosal thickening in the ethmoid sinuses. No acute orbital abnormality. Other: None IMPRESSION: No CT evidence for acute intracranial abnormality. Electronically Signed   By: Jasmine PangKim  Fujinaga M.D.   On: 08/25/2016 19:06   Ct Angio Chest/abd/pel For Dissection W And/or W/wo Result Date: 08/25/2016 CLINICAL DATA:  Hypertension COPD chest pain radiating to the back EXAM: CT ANGIOGRAPHY CHEST, ABDOMEN AND PELVIS TECHNIQUE: Multidetector CT imaging through the chest, abdomen and pelvis was performed using the standard protocol during bolus administration of intravenous contrast. Multiplanar reconstructed images and MIPs were obtained and reviewed to evaluate the vascular anatomy. CONTRAST:  120 mL Isovue 370 intravenous COMPARISON:  Chest x-ray 08/25/2016, CT abdomen pelvis 04/20/2014 FINDINGS: CTA CHEST FINDINGS Cardiovascular: Non contrasted images of the chest demonstrate no evidence for intramural hematoma. No evidence of thoracic aortic aneurysm or dissection. Normal heart size. No pericardial effusion. Mediastinum/Nodes: Nonspecific subcentimeter mediastinal lymph nodes. No  hilar adenopathy. Esophagus unremarkable. Thyroid within normal limits. Trachea midline. Lungs/Pleura: No acute infiltrate, pleural effusion or pneumothorax. In the lingula there is a 10 x 9 mm nodule (10 mm mean diameter) on image 46 of series 7. No other pulmonary nodules are visualized. Musculoskeletal: No chest wall abnormality. No acute or significant osseous findings. Review of the MIP images confirms the above findings. CTA ABDOMEN AND PELVIS FINDINGS VASCULAR Aorta: Normal caliber aorta without aneurysm, dissection, vasculitis or significant stenosis. Minimal atherosclerotic calcifications. Celiac: Patent without evidence of aneurysm, dissection, vasculitis or significant stenosis. SMA: Patent without evidence of aneurysm, dissection, vasculitis or significant stenosis. Renals: Both renal arteries are patent without evidence of aneurysm, dissection, vasculitis, fibromuscular dysplasia or significant stenosis. IMA: Patent without evidence of aneurysm, dissection, vasculitis or significant stenosis. Inflow: Patent without evidence of aneurysm, dissection, vasculitis or significant stenosis. Veins: No obvious venous abnormality within the limitations of this arterial phase study. Review of the MIP images confirms the above findings. NON-VASCULAR Hepatobiliary: No focal liver abnormality is seen. No gallstones, gallbladder wall thickening, or biliary dilatation. Pancreas: Unremarkable. No pancreatic ductal dilatation or surrounding inflammatory changes. Spleen: Normal in size without focal abnormality. Adrenals/Urinary Tract: Adrenal glands are unremarkable. Kidneys are normal, without renal calculi, focal lesion, or hydronephrosis. Bladder is unremarkable. Stomach/Bowel: Stomach is within normal limits. Appendix appears normal. No evidence of bowel wall thickening, distention, or inflammatory changes. Lymphatic: No significant vascular findings are present. No enlarged abdominal or pelvic lymph nodes.  Reproductive: Prostate is unremarkable. Other: Small fatty inguinal hernias. Tiny fat containing umbilical hernia. No free air Musculoskeletal: Stable mild sclerosis within the femoral  heads. No acute osseous abnormality. Review of the MIP images confirms the above findings. IMPRESSION: 1. No evidence for aortic aneurysm or dissection. No hemodynamically significant stenoses involving the major branch vessels of the aorta. 2. 1 cm pulmonary nodule in the lingula. Consider one of the following in 3 months for both low-risk and high-risk individuals: (a) repeat chest CT, (b) follow-up PET-CT, or (c) tissue sampling. This recommendation follows the consensus statement: Guidelines for Management of Incidental Pulmonary Nodules Detected on CT Images: From the Fleischner Society 2017; Radiology 2017; 284:228-243. 3. No CT evidence for acute intra-abdominal or pelvic pathology. Electronically Signed   By: Jasmine Pang M.D.   On: 08/25/2016 19:27    1820:  Given CP with new neuro symptoms, high concern for aortic dissection.  T/C to [facility] Neuro Dr. Nicholas Lose, case discussed, including:  HPI, pertinent PM/SHx, VS/PE, dx testing, ED course and treatment:  Agrees need to take aortic dissection of DDx first before Code Stroke can be entertained, can call Code Stroke if negative dissection workup.  Workup in progress.   1945:  CT-A C/A/P has r/o aortic dissection. CT-H negative for acute pathology. Code Stroke called. T/C from [facility] Neuro Dr. Robb Matar, case discussed, including:  HPI, pertinent PM/SHx, VS/PE, dx testing, ED course and treatment:  Agrees with aortic dissection rule out, he does not believe pt is having acute stroke, pt told him all symptoms began at 0300 which places him out of the window for TPA, requests to admit pt for further workup.  T/C to [facility] Dr. Ophelia Charter, case discussed, including:  HPI, pertinent PM/SHx, VS/PE, dx testing, ED course and treatment:  Agreeable to admit.      Final Clinical Impressions(s)  / ED Diagnoses   Final diagnoses:  Back pain  Left leg weakness  Chest pain  Headache  Left face and left arm tingling    New Prescriptions New Prescriptions   No medications on file     Samuel Jester, DO 08/28/16 1626

## 2016-08-25 NOTE — ED Notes (Addendum)
EKG shown to Dr. Clarene DukeMcManus. Also spoke to Dr. Clarene DukeMcmanus about patients new symptoms of headache and visual changes in left eye. Per Dr. Clarene DukeMcManus, she would like to see patient prior to calling code stroke. No new orders given.

## 2016-08-25 NOTE — ED Notes (Signed)
Neuro tele in progress with Dr Robb Matarrtiz - consultation complete- Dr Robb Matarrtiz reports he does not think patient is having a stroke. Will call Dr Clarene DukeMcManus to discuss.

## 2016-08-25 NOTE — Consult Note (Signed)
Medical Consultation   SKYLEN SPIERING  ZOX:[id number]  DOB: 06-27-1966  DOA: 08/25/2016  PCP: Dwana Melena, MD Consultants:  None Patient coming from: home - lives with wife; NOK: wife, [phone]  Chief Complaint: chest pain  HPI: Mathew Brady is a 51 y.o. male with medical history significant of chronic pain syndrome, neuropathy, HTN, fibromyalgia, and anxiety presenting with chest pain and nonspecific neurological symptoms.  Last night/early morning he awoke with swelling of his left hand, two times the size it should be.  He awoke because his wedding band was cutting in to finger.  Left arm hurting, hurting into the neck.  Some heaviness in his chest, resolved spontaneously.  Used ice to get wedding band off.  Wife wanted him to come to the ER but he didn't want to.  Went back to sleep about 30 minutes later.  Arm still felt a little heavy but they were able to go to church.  Went back for evening church service and he noticed he didn't fell well at all.  Developed midsternal CP radiating into the back and to the shoulder blades.  Left arm still tingling.  Non-exertional.  Chest pain eased off.  Has headache and discomfort in the back of his neck.    Denies h/o heart disease.  +FH of CHF on both sides of family.  MGF CAD.  Has had a stress test remotely (appears to be in 2013).    Normal echo in 2013, no report of stress test in Epic.   ED Course:  Patient discussed with teleneurology; ruled out for aortic dissection; called Code Stroke; suggested admission for CP/CVA R/O.   Ambulatory Status:  ambulates without assistance   Review of Systems:  ROS As per HPI; otherwise 10 point review of systems reviewed and negative.   Past Medical History: Past Medical History:  Diagnosis Date  . Acid reflux   . Anemia 06/18/2012  . Anxiety   . Arthritis   . Chest pain 06/17/2012   MI ruled out  . Chronic abdominal pain   . Chronic prescription benzodiazepine use   .  Fibromyalgia   . Hypertension   . Opiate use   . Palpitations   . Peripheral neuropathy (HCC)   . Prediabetes 06/18/2012   resolved with weight loss  . Rash 06/18/2012   Query eczema    Past Surgical History: Past Surgical History:  Procedure Laterality Date  . ESOPHAGEAL DILATION  1993     Allergies:  No Known Allergies   Social History:  reports that he quit smoking about 17 months ago. His smoking use included Cigarettes. He has a 20.00 pack-year smoking history. He has never used smokeless tobacco. He reports that he does not drink alcohol or use drugs.   Family History: Family History  Problem Relation Age of Onset  . Fibromyalgia Mother   . Bladder Cancer Father 44  . Benign prostatic hyperplasia Father   . Healthy Sister   . Healthy Son     Physical Exam: Vitals:   08/25/16 1759 08/25/16 1830 08/25/16 1945  BP: 141/86 140/84 156/96  Pulse: 79 66 64  Resp: 18 14 14   Temp: 98 F (36.7 C)    TempSrc: Oral    SpO2: 96% 96% 97%  Weight: 91.6 kg (202 lb)    Height: 5\' 9"  (1.753 m)      Constitutional: Alert and awake, oriented x3, not  in any acute distress. Eyes: PERLA, EOMI, irises appear normal, anicteric sclera,  ENMT: external ears and nose appear normal, Lips appears normal, adentulous, oropharynx mucosa, tongue, posterior pharynx appear normal  Neck: neck appears normal, no masses, normal ROM, no thyromegaly, no JVD  CVS: S1-S2 clear, no murmur rubs or gallops, no LE edema, normal pedal pulses  Respiratory:  clear to auscultation bilaterally, no wheezing, rales or rhonchi. Respiratory effort normal. No accessory muscle use.  Abdomen: soft nontender, nondistended, normal bowel sounds, no hepatosplenomegaly, no hernias  Musculoskeletal: : no cyanosis, clubbing or edema noted bilaterally Neuro: Cranial nerves II-XII intact other than c/o diminished sensation in all 3 CN V distributions; decreased strength on left diffusely (4/5 upper and lower  extremities) with diminished sensation diffusely, but there does appear to be some inconsistency with effort Psych: judgement and insight appear normal, stable mood and affect, mental status Skin: no rashes or lesions or ulcers, no induration or nodules   Data reviewed:  I have personally reviewed following labs and imaging studies Labs:  CBC:  Recent Labs Lab 08/25/16 1834 08/25/16 1836  WBC  --  8.1  NEUTROABS  --  4.3  HGB 13.9 14.4  HCT 41.0 42.1  MCV  --  96.3  PLT  --  179    Basic Metabolic Panel:  Recent Labs Lab 08/25/16 1759 08/25/16 1834  NA 139 139  K 4.3 4.7  CL 105 105  CO2 27  --   GLUCOSE 95 94  BUN 13 15  CREATININE 1.05 1.10  CALCIUM 9.1  --    GFR Estimated Creatinine Clearance: 88.9 mL/min (by C-G formula based on SCr of 1.1 mg/dL). Liver Function Tests:  Recent Labs Lab 08/25/16 1839  AST 29  ALT 21  ALKPHOS 73  BILITOT 1.1  PROT 6.6  ALBUMIN 4.2   No results for input(s): LIPASE, AMYLASE in the last 168 hours. No results for input(s): AMMONIA in the last 168 hours. Coagulation profile  Recent Labs Lab 08/25/16 1839  INR 0.97    Cardiac Enzymes: No results for input(s): CKTOTAL, CKMB, CKMBINDEX, TROPONINI in the last 168 hours. BNP: Invalid input(s): POCBNP CBG: No results for input(s): GLUCAP in the last 168 hours. D-Dimer No results for input(s): DDIMER in the last 72 hours. Hgb A1c No results for input(s): HGBA1C in the last 72 hours. Lipid Profile No results for input(s): CHOL, HDL, LDLCALC, TRIG, CHOLHDL, LDLDIRECT in the last 72 hours. Thyroid function studies No results for input(s): TSH, T4TOTAL, T3FREE, THYROIDAB in the last 72 hours.  Invalid input(s): FREET3 Anemia work up No results for input(s): VITAMINB12, FOLATE, FERRITIN, TIBC, IRON, RETICCTPCT in the last 72 hours. Urinalysis    Component Value Date/Time   COLORURINE YELLOW 04/20/2014 1141   APPEARANCEUR CLEAR 04/20/2014 1141   LABSPEC <1.005 (L)  04/20/2014 1141   PHURINE 7.0 04/20/2014 1141   GLUCOSEU NEGATIVE 04/20/2014 1141   HGBUR NEGATIVE 04/20/2014 1141   BILIRUBINUR NEGATIVE 04/20/2014 1141   KETONESUR NEGATIVE 04/20/2014 1141   PROTEINUR NEGATIVE 04/20/2014 1141   UROBILINOGEN 0.2 04/20/2014 1141   NITRITE NEGATIVE 04/20/2014 1141   LEUKOCYTESUR NEGATIVE 04/20/2014 1141     Microbiology No results found for this or any previous visit (from the past 240 hour(s)).     Inpatient Medications:   Scheduled Meds: Continuous Infusions:   Radiological Exams on Admission: Dg Chest 2 View  Result Date: 08/25/2016 CLINICAL DATA:  Patient with chest pain radiating to the back. EXAM: CHEST  2 VIEW COMPARISON:  Chest radiograph 03/22/2015. FINDINGS: Stable cardiac and mediastinal contours. No consolidative pulmonary opacities. There is a 1.2 cm nodular opacity within the left lower lung. Thoracic spine degenerative changes. IMPRESSION: 1.2 cm nodular opacity within the left lower lung. This needs dedicated evaluation with chest CT. No acute cardiopulmonary process. Electronically Signed   By: Annia Belt M.D.   On: 08/25/2016 18:33   Ct Head Wo Contrast  Result Date: 08/25/2016 CLINICAL DATA:  Sudden onset of headache with blurry vision EXAM: CT HEAD WITHOUT CONTRAST TECHNIQUE: Contiguous axial images were obtained from the base of the skull through the vertex without intravenous contrast. COMPARISON:  03/22/2015 FINDINGS: Brain: No evidence of acute infarction, hemorrhage, hydrocephalus, extra-axial collection or mass lesion/mass effect. Vascular: No hyperdense vessel or unexpected calcification. Skull: Normal. Negative for fracture or focal lesion. Sinuses/Orbits: Mild mucosal thickening in the ethmoid sinuses. No acute orbital abnormality. Other: None IMPRESSION: No CT evidence for acute intracranial abnormality. Electronically Signed   By: Jasmine Pang M.D.   On: 08/25/2016 19:06   Ct Angio Chest/abd/pel For Dissection W And/or  W/wo  Result Date: 08/25/2016 CLINICAL DATA:  Hypertension COPD chest pain radiating to the back EXAM: CT ANGIOGRAPHY CHEST, ABDOMEN AND PELVIS TECHNIQUE: Multidetector CT imaging through the chest, abdomen and pelvis was performed using the standard protocol during bolus administration of intravenous contrast. Multiplanar reconstructed images and MIPs were obtained and reviewed to evaluate the vascular anatomy. CONTRAST:  120 mL Isovue 370 intravenous COMPARISON:  Chest x-ray 08/25/2016, CT abdomen pelvis 04/20/2014 FINDINGS: CTA CHEST FINDINGS Cardiovascular: Non contrasted images of the chest demonstrate no evidence for intramural hematoma. No evidence of thoracic aortic aneurysm or dissection. Normal heart size. No pericardial effusion. Mediastinum/Nodes: Nonspecific subcentimeter mediastinal lymph nodes. No hilar adenopathy. Esophagus unremarkable. Thyroid within normal limits. Trachea midline. Lungs/Pleura: No acute infiltrate, pleural effusion or pneumothorax. In the lingula there is a 10 x 9 mm nodule (10 mm mean diameter) on image 46 of series 7. No other pulmonary nodules are visualized. Musculoskeletal: No chest wall abnormality. No acute or significant osseous findings. Review of the MIP images confirms the above findings. CTA ABDOMEN AND PELVIS FINDINGS VASCULAR Aorta: Normal caliber aorta without aneurysm, dissection, vasculitis or significant stenosis. Minimal atherosclerotic calcifications. Celiac: Patent without evidence of aneurysm, dissection, vasculitis or significant stenosis. SMA: Patent without evidence of aneurysm, dissection, vasculitis or significant stenosis. Renals: Both renal arteries are patent without evidence of aneurysm, dissection, vasculitis, fibromuscular dysplasia or significant stenosis. IMA: Patent without evidence of aneurysm, dissection, vasculitis or significant stenosis. Inflow: Patent without evidence of aneurysm, dissection, vasculitis or significant stenosis. Veins: No  obvious venous abnormality within the limitations of this arterial phase study. Review of the MIP images confirms the above findings. NON-VASCULAR Hepatobiliary: No focal liver abnormality is seen. No gallstones, gallbladder wall thickening, or biliary dilatation. Pancreas: Unremarkable. No pancreatic ductal dilatation or surrounding inflammatory changes. Spleen: Normal in size without focal abnormality. Adrenals/Urinary Tract: Adrenal glands are unremarkable. Kidneys are normal, without renal calculi, focal lesion, or hydronephrosis. Bladder is unremarkable. Stomach/Bowel: Stomach is within normal limits. Appendix appears normal. No evidence of bowel wall thickening, distention, or inflammatory changes. Lymphatic: No significant vascular findings are present. No enlarged abdominal or pelvic lymph nodes. Reproductive: Prostate is unremarkable. Other: Small fatty inguinal hernias. Tiny fat containing umbilical hernia. No free air Musculoskeletal: Stable mild sclerosis within the femoral heads. No acute osseous abnormality. Review of the MIP images confirms the above findings. IMPRESSION: 1. No evidence for  aortic aneurysm or dissection. No hemodynamically significant stenoses involving the major branch vessels of the aorta. 2. 1 cm pulmonary nodule in the lingula. Consider one of the following in 3 months for both low-risk and high-risk individuals: (a) repeat chest CT, (b) follow-up PET-CT, or (c) tissue sampling. This recommendation follows the consensus statement: Guidelines for Management of Incidental Pulmonary Nodules Detected on CT Images: From the Fleischner Society 2017; Radiology 2017; 284:228-243. 3. No CT evidence for acute intra-abdominal or pelvic pathology. Electronically Signed   By: Jasmine Pang M.D.   On: 08/25/2016 19:27   EKG:  NSR, rate 74, LAFB, NSCSLT Troponin 0.01 today Lipid panel in 6/17: TC 192, HDL 34, LDL 94, TG 322 A1c 5.5 in 96/29  Impression/Recommendations Principal  Problem:   Chest pain Active Problems:   Neurological complaint  -Patient presenting with diffuse left-sided neurologic symptoms as well as with chest pain -Currently chest pain free but with a headache -Teleneurologist was consulted -Based on evaluation performed to date, recommendation was for admission for further chest pain rule-out as well as MRI brain and C-spine to evaluate for CVA as well as radiculopathy as cause for symptoms. -It would be hard to find a unifying diagnosis, but the evaluation is not yet complete. -Unfortunately, even after explanation of potential sequelae of refusing admission to include severe disability and/or death from stroke and death from MI, the patient signed out against medical advice. -He is encouraged to f/u with his PCP for outpatient MRI and stress testing as above.   -He also appears to have marked weakness diffusely on his left side and might benefit from PT/OT evaluation. -I have reviewed this patient in the [place] Controlled Substances Reporting System.  He is receiving his medications from only one provider and appears to be taking them as prescribed. -He did have a recent change in his opiate medication and is now taking Nucynta as of 08/19/16; this is likely unrelated but may be something to consider if other evaluation is negative and symptoms are ongoing. -Additionally, he does report a flu-like illness last week which is now completely resolved; his current presentation could also be post-viral in nature.   Thank you for this consultation.  Our [facility] hospitalist team would have been happy to admit this patient, but he refused admission and signed out Against Medical Advice.  Time Spent: 60 minutes  Jonah Blue M.D. [facility] 08/25/2016, 9:13 PM

## 2016-08-25 NOTE — ED Notes (Addendum)
IV access started by prior shift- IV was removed prior to pt leaving AMA- cath intact- bleeding controlled, gauze and Band-Aid applied.

## 2016-08-25 NOTE — ED Notes (Signed)
ED Provider at bedside. 

## 2016-08-25 NOTE — ED Notes (Signed)
Patient now complains of sudden onset of headache with left eye blurriness. Patient has decreased grip on left side, patient denies knowing when that began.

## 2016-08-25 NOTE — ED Triage Notes (Signed)
Patient reports of chest pain that radiates to back that began this morning at 0300 with left arm tingling. States pain got worse this evening while sitting in church.

## 2016-09-25 ENCOUNTER — Ambulatory Visit: Payer: BLUE CROSS/BLUE SHIELD | Admitting: Family Medicine

## 2016-09-26 ENCOUNTER — Telehealth: Payer: Self-pay | Admitting: Family Medicine

## 2016-09-26 ENCOUNTER — Encounter: Payer: Self-pay | Admitting: Internal Medicine

## 2016-09-26 NOTE — Telephone Encounter (Signed)
Left message to call us back if they need to reshedule appointment

## 2016-10-22 DIAGNOSIS — I10 Essential (primary) hypertension: Secondary | ICD-10-CM | POA: Diagnosis not present

## 2016-10-22 DIAGNOSIS — M797 Fibromyalgia: Secondary | ICD-10-CM | POA: Diagnosis not present

## 2016-10-22 DIAGNOSIS — G8929 Other chronic pain: Secondary | ICD-10-CM | POA: Diagnosis not present

## 2016-11-07 ENCOUNTER — Ambulatory Visit (HOSPITAL_COMMUNITY)
Admission: RE | Admit: 2016-11-07 | Discharge: 2016-11-07 | Disposition: A | Discharge status: Home / Self Care | Payer: Self-pay | Source: Ambulatory Visit | Attending: Internal Medicine | Admitting: Internal Medicine

## 2016-11-07 ENCOUNTER — Other Ambulatory Visit: Payer: Self-pay | Admitting: Internal Medicine

## 2016-11-07 DIAGNOSIS — M160 Bilateral primary osteoarthritis of hip: Secondary | ICD-10-CM | POA: Insufficient documentation

## 2016-11-07 DIAGNOSIS — R072 Precordial pain: Secondary | ICD-10-CM | POA: Diagnosis not present

## 2016-11-07 DIAGNOSIS — M5136 Other intervertebral disc degeneration, lumbar region: Secondary | ICD-10-CM | POA: Insufficient documentation

## 2016-11-07 DIAGNOSIS — M545 Low back pain: Secondary | ICD-10-CM

## 2016-11-07 DIAGNOSIS — I708 Atherosclerosis of other arteries: Secondary | ICD-10-CM | POA: Insufficient documentation

## 2016-11-07 DIAGNOSIS — I700 Atherosclerosis of aorta: Secondary | ICD-10-CM | POA: Insufficient documentation

## 2016-11-18 DIAGNOSIS — L989 Disorder of the skin and subcutaneous tissue, unspecified: Secondary | ICD-10-CM | POA: Diagnosis not present

## 2016-11-18 DIAGNOSIS — G8929 Other chronic pain: Secondary | ICD-10-CM | POA: Diagnosis not present

## 2016-11-18 DIAGNOSIS — F424 Excoriation (skin-picking) disorder: Secondary | ICD-10-CM | POA: Diagnosis not present

## 2016-11-27 ENCOUNTER — Encounter: Payer: Self-pay | Admitting: Physician Assistant

## 2016-11-27 NOTE — Progress Notes (Addendum)
Cardiology Office Note    Date:  11/28/2016  ID:  Mathew CorollaJohn H Maiers, DOB 07/02/1966, MRN [id number] PCP:  Benita StabileHall, Storm Z, MD  Cardiologist:  New, reviewed with Dr. Wyline MoodBranch  Chief Complaint: chest pain, night sweats, heart palpitations  History of Present Illness:  Mathew Brady is a 51 y.o. male with history of chronic pain syndrome, neuropathy, HTN, fibromyalgia, hypertriglyceridemia and anxiety who presents for new patient evaluation of chest pain at the request of Dr. Margo AyeHall. Prior cardiac studies include 2D echo 01/2012 showing EF 55-60%, RWMA cannot be excluded. He was seen in the ED 08/2016 with swelling of left hand, left arm heaviness, and intermittent chest pain. CT head did not show any acute intracranial abnormality. CT abd/pelvis showed no dissection or hemodynamically significant stenosis, 1cm pulm nodule with recommendation for f/u in 3 months, otherwise minimal atherosclerotic calcifications of the aorta but no mention of CAD. Admission for observation and further workup recommended but patient left AMA. Last CBC 08/25/16 showed Hgb 14.4, LFTs wnl, K 4.7, Cr 1.1. Last lipids 12/2015 showed trig 322, HDL 34, LDL 94.  He presents for evaluation of multiple complaints:  1) Heart palpitations - has had for years, intermittent, seem to happen out of the blue, have lasted up to half a day, feel like his heart is pounding erratically/rapidly. No syncope. No specific relationship to chest pain.  2) Has h/o CP back in 2013. Was told he has some narrowing of an artery behind his heart due to high blood pressure. Also was told he had blockages in his neck arteries. He never felt the need to f/u further because this resolved. Had brief episode in 08/2016 prompting ED visit but left AMA. Hadn't had much recurrent trouble until the last week. He fell off a ladder in a freak accident 11/07/16 (did not pass out). Lumbar spine imaging mentioned aortic atherosclerosis prompting cardiology referral. In the meantime over  the last 2 weeks he's begun to have intermittent episodes of stabbing chest pain radiating to his posterior shoulders, some intermittent nausea, vomiting, or generally feeling unwell. The chest pain can last minutes to hours. Several of these episodes have happened in the middle of the night, waking him up in drenching night sweats. He's also had other odd symptoms such as a burning sensation all over his body and associated gum pain. He weighed 225 about 5 month ago and now weighs 185lb. His wife feels he's been more physically active but he does not think he has been to account for the weight loss. His chest pain seems to be primarily when he is not doing anything at all - no specific relationship to activity. He has noted dyspnea on exertion for the last 5 months. For the last 2 days he's actually been feeling fine again and more like himself. He walked into clinic today without any chest pain. He's not had any dyspnea, orthopnea, LEE, hemoptysis, leg pain worrisome for claudication. His blood pressure has been running consistently high throughout all of these episodes.    Past Medical History:  Diagnosis Date  . Acid reflux   . Anemia 06/18/2012  . Anxiety   . Arthritis   . Chest pain 06/17/2012   MI ruled out  . Chronic abdominal pain   . Chronic prescription benzodiazepine use   . Fibromyalgia   . Hypertension   . Hypertriglyceridemia   . Opiate use   . Palpitations   . Peripheral neuropathy   . Prediabetes 06/18/2012  resolved with weight loss  . Pulmonary nodule   . Rash 06/18/2012   Query eczema    Past Surgical History:  Procedure Laterality Date  . ESOPHAGEAL DILATION  1993    Current Medications: Outpatient Medications Prior to Visit  Medication Sig Dispense Refill  . amLODipine (NORVASC) 5 MG tablet Take 5 mg by mouth daily.  4  . cyanocobalamin (,VITAMIN B-12,) 1000 MCG/ML injection Inject 1 mL (1,000 mcg total) into the muscle once. (Patient taking differently:  Inject 1,000 mcg into the muscle every 30 (thirty) days. ) 25 mL 0  . gabapentin (NEURONTIN) 800 MG tablet Take 1 tablet (800 mg total) by mouth 3 (three) times daily. 270 tablet 1  . lisinopril (PRINIVIL,ZESTRIL) 20 MG tablet Take 20 mg by mouth daily.  4  . meloxicam (MOBIC) 15 MG tablet Take 15 mg by mouth daily.  1  . NUCYNTA ER 50 MG 12 hr tablet Take 50 mg by mouth 2 (two) times daily.  0  . VASCEPA 1 g CAPS Take 1 g by mouth 2 (two) times daily.  4   No facility-administered medications prior to visit.      Allergies:   Patient has no known allergies.   Social History   Social History  . Marital status: Married    Spouse name: N/A  . Number of children: N/A  . 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 Smoking status: Former Smoker    Packs/day: 2.00    Types: Cigarettes    Quit date: 03/09/2015  . Smokeless tobacco: Never Used     Comment: since age 43, has continued to smoke on/off  . Alcohol use No  . Drug use: No  . Sexual activity: Yes    Partners: Female   Other Topics Concern  . None   Social History Narrative   Lives with wife in a 2 story home.  Has 5 children all together.  Not working at this time.  Trying to get disability.  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   Education: Some high school.     Family History:  Family History  Problem Relation Age of Onset  . Fibromyalgia Mother   . Bladder Cancer Father 63  . Benign prostatic hyperplasia Father   . Healthy Sister   . Healthy Son   . Heart failure Paternal Grandmother   . Heart failure Paternal Grandfather     ROS:   Please see the history of present illness. No hemoptysis, melena, BRBPR. All other systems are reviewed and otherwise negative.    PHYSICAL EXAM:   VS:  BP (!) 159/93 (BP Location: Right Arm, Cuff Size: Normal)   Pulse 84   Ht 5\' 9"  (1.753 m)   Wt 186 lb (84.4 kg)   SpO2 96%   BMI 27.47 kg/m    BMI: Body mass index is 27.47 kg/m. GEN: Well nourished, well developed WM, in no acute distress  HEENT: normocephalic, atraumatic Neck: no JVD, carotid bruits, or masses Cardiac: RRR; no murmurs, rubs, or gallops, no edema., no abd bruits Respiratory:  Diminished BS throughout, no wheezes rales or rhonchi, normal work of breathing GI: soft, nontender, nondistended, + BS MS: no deformity or atrophy  Skin: warm and dry, no rash Neuro:  Alert and Oriented x 3, Strength and sensation are intact, follows commands Psych: euthymic mood, full affect  Wt Readings from  Last 3 Encounters:  11/28/16 186 lb (84.4 kg)  08/25/16 202 lb (91.6 kg)  06/26/16 206 lb (93.4 kg)      Studies/Labs Reviewed:   EKG:  EKG was ordered today and personally reviewed by me and demonstrates NSR 80bpm, left axis deviation, otherwise no acute ST-T changes  Recent Labs: 08/25/2016: ALT 21; BUN 15; Creatinine, Ser 1.10; Hemoglobin 14.4; Platelets 179; Potassium 4.7; Sodium 139   Lipid Panel    Component Value Date/Time   CHOL 192 12/21/2015 1404   TRIG 322 (H) 12/21/2015 1404   HDL 34 (L) 12/21/2015 1404   CHOLHDL 5.6 (H) 12/21/2015 1404   CHOLHDL 7.4 01/27/2012 0535   VLDL UNABLE TO CALCULATE IF TRIGLYCERIDE OVER 400 mg/dL 72/53/6644 0347   LDLCALC 94 12/21/2015 1404    Additional studies/ records that were reviewed today include: Summarized above  ASSESSMENT & PLAN:   The patient's case was discussed with Dr. Wyline Mood as this is a new patient to our clinic.  1. Chest pain - mixed atypical/typical features. EKG is unremarkable. Recent troponin negative. Cardiac risk factors include uncontrolled HTN, dyslipidemia, longstanding intermittent tobacco abuse (currently abstinent), and known aortic atherosclerosis on imaging. Further complicating the clinical picture is what sounds like very concerning constitutional symptoms including a 40lb unintentional weight loss and night sweats in the context of a recent  CT 08/2016 with a 1cm pulmonary nodule requiring further workup which the patient states he deferred. Will proceed with exercise nuclear stress test. If unable to reach target, he will need to change to [facility]. If stress test is abnormal will likely need cardiac cath, but will need to also take into account probable further workup of #5. Start aspirin 81mg  daily. See below re: BB. He is not tachycardic, tachypneic or hypoxic and has no signs of DVT on exam. Warning sx reviewed. 2. Heart palpitations - longstanding, need to obtain monitor to exclude significant arrhythmia. They are not happening with enough regularity to catch in 24-48 hour monitor so will obtain 30-day monitor. Will also check basic labs to exclude obvious metabolic abnormality. 3. Essential HTN - add metoprolol 25mg  BID for HTN, palpitations and potential anti-anginal effect. Patient follows BP at home - advised him to call if SBP continues to run >130 even after several days of medication. 4. Hypertriglyceridemia - the patient has been intermittently not taking his medication for this because it "makes him ache." I stressed importance of making PCP aware so that his lipids can be aggressively treated in the context of known atherosclerosis. 5. Pulmonary nodule - as above, I am concerned with his weight loss, night sweats, and generalized fatigue that this may represent malignancy. He's not had any unusual contacts to suggest acquired infection such as TB. I shared my concern with the patient and his wife and urged him to contact his PCP to facilitate further workup of this. 6. Carotid artery disease - this is the least pressing of the issues, but he reports being told he had blockages in his neck arteries that would require follow-up eventually. Will arrange duplex.  Disposition: F/u with Dr. Wyline Mood or APP after above testing.   Medication Adjustments/Labs and Tests Ordered: Current medicines are reviewed at length with the patient  today.  Concerns regarding medicines are outlined above. Medication changes, Labs and Tests ordered today are summarized above and listed in the Patient Instructions accessible in Encounters.   Signed, Laurann Montana, PA-C  11/28/2016 3:31 PM    [facility] -  [facility] in [place]. [place], [place] [address] Ph: [phone]; Fax [phone]

## 2016-11-28 ENCOUNTER — Ambulatory Visit (INDEPENDENT_AMBULATORY_CARE_PROVIDER_SITE_OTHER): Payer: BLUE CROSS/BLUE SHIELD | Admitting: Physician Assistant

## 2016-11-28 ENCOUNTER — Encounter: Payer: Self-pay | Admitting: *Deleted

## 2016-11-28 ENCOUNTER — Other Ambulatory Visit (HOSPITAL_COMMUNITY)
Admission: RE | Admit: 2016-11-28 | Discharge: 2016-11-28 | Disposition: A | Discharge status: Home / Self Care | Payer: BLUE CROSS/BLUE SHIELD | Source: Ambulatory Visit | Attending: Physician Assistant | Admitting: Physician Assistant

## 2016-11-28 ENCOUNTER — Encounter: Payer: Self-pay | Admitting: Physician Assistant

## 2016-11-28 VITALS — BP 159/93 | HR 84 | Ht 69.0 in | Wt 186.0 lb

## 2016-11-28 DIAGNOSIS — I779 Disorder of arteries and arterioles, unspecified: Secondary | ICD-10-CM | POA: Diagnosis not present

## 2016-11-28 DIAGNOSIS — R002 Palpitations: Secondary | ICD-10-CM | POA: Diagnosis not present

## 2016-11-28 DIAGNOSIS — R079 Chest pain, unspecified: Secondary | ICD-10-CM

## 2016-11-28 DIAGNOSIS — E781 Pure hyperglyceridemia: Secondary | ICD-10-CM | POA: Diagnosis not present

## 2016-11-28 DIAGNOSIS — R911 Solitary pulmonary nodule: Secondary | ICD-10-CM

## 2016-11-28 DIAGNOSIS — I10 Essential (primary) hypertension: Secondary | ICD-10-CM | POA: Insufficient documentation

## 2016-11-28 DIAGNOSIS — I739 Peripheral vascular disease, unspecified: Secondary | ICD-10-CM

## 2016-11-28 LAB — BASIC METABOLIC PANEL
ANION GAP: 6 (ref 5–15)
BUN: 12 mg/dL (ref 6–20)
CHLORIDE: 105 mmol/L (ref 101–111)
CO2: 28 mmol/L (ref 22–32)
Calcium: 9.2 mg/dL (ref 8.9–10.3)
Creatinine, Ser: 0.81 mg/dL (ref 0.61–1.24)
GFR calc Af Amer: >60 mL/min (ref >60)
GFR calc non Af Amer: >60 mL/min (ref >60)
GLUCOSE: 88 mg/dL (ref 65–99)
POTASSIUM: 3.7 mmol/L (ref 3.5–5.1)
SODIUM: 139 mmol/L (ref 135–145)

## 2016-11-28 LAB — TSH: TSH: 2.407 u[IU]/mL (ref 0.350–4.500)

## 2016-11-28 LAB — CBC
HEMATOCRIT: 43 % (ref 39.0–52.0)
HEMOGLOBIN: 14.5 g/dL (ref 13.0–17.0)
MCH: 31.9 pg (ref 26.0–34.0)
MCHC: 33.7 g/dL (ref 30.0–36.0)
MCV: 94.7 fL (ref 78.0–100.0)
Platelets: 271 10*3/uL (ref 150–400)
RBC: 4.54 MIL/uL (ref 4.22–5.81)
RDW: 13.4 % (ref 11.5–15.5)
WBC: 7.9 10*3/uL (ref 4.0–10.5)

## 2016-11-28 LAB — MAGNESIUM: Magnesium: 2.1 mg/dL (ref 1.7–2.4)

## 2016-11-28 MED ORDER — ASPIRIN EC 81 MG PO TBEC: 81.0000 mg | DELAYED_RELEASE_TABLET | Freq: Every day | ORAL | 3 refills | Status: DC

## 2016-11-28 MED ORDER — METOPROLOL TARTRATE 25 MG PO TABS: 25.0000 mg | ORAL_TABLET | Freq: Two times a day (BID) | ORAL | 1 refills | Status: DC

## 2016-11-28 NOTE — Patient Instructions (Addendum)
Medication Instructions:   Your physician has recommended you make the following change in your medication:   Start metoprolol tartrate (lopressor) 25 mg by mouth twice daily.  Start aspirin 81 mg daily.  Continue all other medications the same.  Labwork:  Your physician recommends that you have lab work today to check your TSH, Magnesium, BMET, & CBC.  Testing/Procedures: Your physician has requested that you have en exercise stress myoview. For further information please visit [email or web address]. Please follow instruction sheet, as given. Your physician has recommended that you wear an event monitor for 30 days. Event monitors are medical devices that record the heart's electrical activity. Doctors most often us these monitors to diagnose arrhythmias. Arrhythmias are problems with the speed or rhythm of the heartbeat. The monitor is a small, portable device. You can wear one while you do your normal daily activities. This is usually used to diagnose what is causing palpitations/syncope (passing out). Your physician has requested that you have a carotid duplex. This test is an ultrasound of the carotid arteries in your neck. It looks at blood flow through these arteries that supply the brain with blood. Allow one hour for this exam. There are no restrictions or special instructions.  Follow-Up:  Your physician recommends that you schedule a follow-up appointment in: 4-6 weeks with Dr. Wyline MoodBranch after all test are completed.  Any Other Special Instructions Will Be Listed Below (If Applicable).  Call our office if the top number (systolic) of your blood pressure is greater than 130.  If you need a refill on your cardiac medications before your next appointment, please call your pharmacy.

## 2016-12-04 ENCOUNTER — Ambulatory Visit (HOSPITAL_COMMUNITY)
Admission: RE | Admit: 2016-12-04 | Discharge: 2016-12-04 | Disposition: A | Discharge status: Home / Self Care | Payer: BLUE CROSS/BLUE SHIELD | Source: Ambulatory Visit | Attending: Physician Assistant | Admitting: Physician Assistant

## 2016-12-04 DIAGNOSIS — I779 Disorder of arteries and arterioles, unspecified: Secondary | ICD-10-CM | POA: Diagnosis not present

## 2016-12-04 DIAGNOSIS — I739 Peripheral vascular disease, unspecified: Secondary | ICD-10-CM

## 2016-12-04 DIAGNOSIS — I6523 Occlusion and stenosis of bilateral carotid arteries: Secondary | ICD-10-CM | POA: Insufficient documentation

## 2016-12-05 ENCOUNTER — Encounter (HOSPITAL_COMMUNITY)
Admission: RE | Admit: 2016-12-05 | Discharge: 2016-12-05 | Disposition: A | Discharge status: Home / Self Care | Payer: BLUE CROSS/BLUE SHIELD | Source: Ambulatory Visit | Attending: Physician Assistant | Admitting: Physician Assistant

## 2016-12-05 ENCOUNTER — Encounter (HOSPITAL_COMMUNITY): Payer: Self-pay

## 2016-12-05 ENCOUNTER — Encounter (HOSPITAL_BASED_OUTPATIENT_CLINIC_OR_DEPARTMENT_OTHER)
Admission: RE | Admit: 2016-12-05 | Discharge: 2016-12-05 | Disposition: A | Discharge status: Home / Self Care | Payer: BLUE CROSS/BLUE SHIELD | Source: Ambulatory Visit | Attending: Physician Assistant | Admitting: Physician Assistant

## 2016-12-05 ENCOUNTER — Ambulatory Visit (INDEPENDENT_AMBULATORY_CARE_PROVIDER_SITE_OTHER): Payer: BLUE CROSS/BLUE SHIELD

## 2016-12-05 DIAGNOSIS — R002 Palpitations: Secondary | ICD-10-CM | POA: Diagnosis not present

## 2016-12-05 DIAGNOSIS — R079 Chest pain, unspecified: Secondary | ICD-10-CM

## 2016-12-05 LAB — NM MYOCAR MULTI W/SPECT W/WALL MOTION / EF
CHL CUP MPHR: 169 {beats}/min
CHL CUP NUCLEAR SRS: 0
CHL CUP NUCLEAR SSS: 3
CHL RATE OF PERCEIVED EXERTION: 17
CSEPED: 4 min
CSEPEW: 7 METS
CSEPHR: 59 %
Exercise duration (sec): 8 s
LHR: 0.34
LV dias vol: 113 mL (ref 62–150)
LV sys vol: 48 mL
NUC STRESS TID: 1.02
Peak HR: 100 {beats}/min
Rest HR: 53 {beats}/min
SDS: 3

## 2016-12-05 MED ORDER — REGADENOSON 0.4 MG/5ML IV SOLN
INTRAVENOUS | Status: AC
  Administered 2016-12-05: 0.4 mg via INTRAVENOUS
  Filled 2016-12-05: qty 5

## 2016-12-05 MED ORDER — TECHNETIUM TC 99M TETROFOSMIN IV KIT
10.0000 | PACK | Freq: Once | INTRAVENOUS | Status: AC | PRN
  Administered 2016-12-05: 10.2 via INTRAVENOUS

## 2016-12-05 MED ORDER — SODIUM CHLORIDE 0.9% FLUSH
INTRAVENOUS | Status: AC
  Administered 2016-12-05: 10 mL via INTRAVENOUS
  Filled 2016-12-05: qty 10

## 2016-12-05 MED ORDER — TECHNETIUM TC 99M TETROFOSMIN IV KIT
30.0000 | PACK | Freq: Once | INTRAVENOUS | Status: AC | PRN
  Administered 2016-12-05: 32 via INTRAVENOUS

## 2016-12-09 ENCOUNTER — Observation Stay (HOSPITAL_COMMUNITY)
Admission: EM | Admit: 2016-12-09 | Discharge: 2016-12-10 | Disposition: A | Discharge status: Home / Self Care | Payer: BLUE CROSS/BLUE SHIELD | Attending: Internal Medicine | Admitting: Internal Medicine

## 2016-12-09 ENCOUNTER — Other Ambulatory Visit: Payer: Self-pay

## 2016-12-09 ENCOUNTER — Encounter (HOSPITAL_COMMUNITY): Payer: Self-pay | Admitting: *Deleted

## 2016-12-09 ENCOUNTER — Encounter (HOSPITAL_COMMUNITY)
Admission: EM | Disposition: A | Discharge status: Home / Self Care | Payer: Self-pay | Source: Home / Self Care | Attending: Emergency Medicine

## 2016-12-09 ENCOUNTER — Emergency Department (HOSPITAL_COMMUNITY): Payer: BLUE CROSS/BLUE SHIELD

## 2016-12-09 DIAGNOSIS — M1990 Unspecified osteoarthritis, unspecified site: Secondary | ICD-10-CM | POA: Diagnosis not present

## 2016-12-09 DIAGNOSIS — R079 Chest pain, unspecified: Secondary | ICD-10-CM

## 2016-12-09 DIAGNOSIS — G8929 Other chronic pain: Secondary | ICD-10-CM | POA: Insufficient documentation

## 2016-12-09 DIAGNOSIS — M797 Fibromyalgia: Secondary | ICD-10-CM | POA: Insufficient documentation

## 2016-12-09 DIAGNOSIS — Z7982 Long term (current) use of aspirin: Secondary | ICD-10-CM | POA: Insufficient documentation

## 2016-12-09 DIAGNOSIS — R911 Solitary pulmonary nodule: Secondary | ICD-10-CM | POA: Insufficient documentation

## 2016-12-09 DIAGNOSIS — E785 Hyperlipidemia, unspecified: Secondary | ICD-10-CM | POA: Diagnosis not present

## 2016-12-09 DIAGNOSIS — I2510 Atherosclerotic heart disease of native coronary artery without angina pectoris: Secondary | ICD-10-CM | POA: Diagnosis not present

## 2016-12-09 DIAGNOSIS — R002 Palpitations: Secondary | ICD-10-CM | POA: Diagnosis not present

## 2016-12-09 DIAGNOSIS — K219 Gastro-esophageal reflux disease without esophagitis: Secondary | ICD-10-CM | POA: Diagnosis not present

## 2016-12-09 DIAGNOSIS — E781 Pure hyperglyceridemia: Secondary | ICD-10-CM | POA: Insufficient documentation

## 2016-12-09 DIAGNOSIS — R7303 Prediabetes: Secondary | ICD-10-CM | POA: Diagnosis not present

## 2016-12-09 DIAGNOSIS — G629 Polyneuropathy, unspecified: Secondary | ICD-10-CM | POA: Insufficient documentation

## 2016-12-09 DIAGNOSIS — R0789 Other chest pain: Secondary | ICD-10-CM | POA: Diagnosis not present

## 2016-12-09 DIAGNOSIS — I200 Unstable angina: Secondary | ICD-10-CM

## 2016-12-09 DIAGNOSIS — R109 Unspecified abdominal pain: Secondary | ICD-10-CM | POA: Diagnosis not present

## 2016-12-09 DIAGNOSIS — F419 Anxiety disorder, unspecified: Secondary | ICD-10-CM | POA: Diagnosis not present

## 2016-12-09 DIAGNOSIS — Z720 Tobacco use: Secondary | ICD-10-CM

## 2016-12-09 DIAGNOSIS — F17210 Nicotine dependence, cigarettes, uncomplicated: Secondary | ICD-10-CM | POA: Diagnosis not present

## 2016-12-09 DIAGNOSIS — Z8249 Family history of ischemic heart disease and other diseases of the circulatory system: Secondary | ICD-10-CM | POA: Insufficient documentation

## 2016-12-09 DIAGNOSIS — Z801 Family history of malignant neoplasm of trachea, bronchus and lung: Secondary | ICD-10-CM | POA: Insufficient documentation

## 2016-12-09 DIAGNOSIS — I10 Essential (primary) hypertension: Secondary | ICD-10-CM | POA: Insufficient documentation

## 2016-12-09 HISTORY — PX: LEFT HEART CATH AND CORONARY ANGIOGRAPHY: CATH118249

## 2016-12-09 LAB — BASIC METABOLIC PANEL
Anion gap: 10 (ref 5–15)
BUN: 10 mg/dL (ref 6–20)
CALCIUM: 9.3 mg/dL (ref 8.9–10.3)
CO2: 29 mmol/L (ref 22–32)
Chloride: 104 mmol/L (ref 101–111)
Creatinine, Ser: 1.04 mg/dL (ref 0.61–1.24)
GFR calc Af Amer: >60 mL/min (ref >60)
Glucose, Bld: 109 mg/dL — ABNORMAL HIGH (ref 65–99)
Potassium: 3.6 mmol/L (ref 3.5–5.1)
Sodium: 143 mmol/L (ref 135–145)

## 2016-12-09 LAB — CBC WITH DIFFERENTIAL/PLATELET
BASOS ABS: 0 10*3/uL (ref 0.0–0.1)
Basophils Relative: 0 %
EOS PCT: 1 %
Eosinophils Absolute: 0.1 10*3/uL (ref 0.0–0.7)
HCT: 44.6 % (ref 39.0–52.0)
Hemoglobin: 15 g/dL (ref 13.0–17.0)
LYMPHS PCT: 42 %
Lymphs Abs: 4.1 10*3/uL — ABNORMAL HIGH (ref 0.7–4.0)
MCH: 32.1 pg (ref 26.0–34.0)
MCHC: 33.6 g/dL (ref 30.0–36.0)
MCV: 95.5 fL (ref 78.0–100.0)
MONO ABS: 0.8 10*3/uL (ref 0.1–1.0)
Monocytes Relative: 9 %
Neutro Abs: 4.6 10*3/uL (ref 1.7–7.7)
Neutrophils Relative %: 48 %
Platelets: 237 10*3/uL (ref 150–400)
RBC: 4.67 MIL/uL (ref 4.22–5.81)
RDW: 13 % (ref 11.5–15.5)
WBC: 9.6 10*3/uL (ref 4.0–10.5)

## 2016-12-09 LAB — I-STAT TROPONIN, ED: Troponin i, poc: 0 ng/mL (ref 0.00–0.08)

## 2016-12-09 LAB — PROTIME-INR
INR: 0.9
Prothrombin Time: 12.2 seconds (ref 11.4–15.2)

## 2016-12-09 LAB — HIV ANTIBODY (ROUTINE TESTING W REFLEX): HIV SCREEN 4TH GENERATION: NONREACTIVE

## 2016-12-09 LAB — MRSA PCR SCREENING: MRSA by PCR: NEGATIVE

## 2016-12-09 LAB — TROPONIN I
Troponin I: <0.03 ng/mL (ref <0.03)
Troponin I: <0.03 ng/mL (ref <0.03)
Troponin I: <0.03 ng/mL (ref <0.03)

## 2016-12-09 LAB — HEPARIN LEVEL (UNFRACTIONATED): HEPARIN UNFRACTIONATED: 0.16 [IU]/mL — AB (ref 0.30–0.70)

## 2016-12-09 LAB — APTT: aPTT: 33 seconds (ref 24–36)

## 2016-12-09 SURGERY — LEFT HEART CATH AND CORONARY ANGIOGRAPHY
Anesthesia: LOCAL

## 2016-12-09 MED ORDER — FENTANYL CITRATE (PF) 100 MCG/2ML IJ SOLN
INTRAMUSCULAR | Status: AC
  Filled 2016-12-09: qty 2

## 2016-12-09 MED ORDER — ASPIRIN 81 MG PO CHEW
81.0000 mg | CHEWABLE_TABLET | Freq: Every day | ORAL | Status: DC
  Administered 2016-12-10: 81 mg via ORAL
  Filled 2016-12-09: qty 1

## 2016-12-09 MED ORDER — ASPIRIN 325 MG PO TABS: 325.0000 mg | ORAL_TABLET | Freq: Every day | ORAL | Status: DC

## 2016-12-09 MED ORDER — TAPENTADOL HCL ER 50 MG PO TB12: 50.0000 mg | ORAL_TABLET | Freq: Two times a day (BID) | ORAL | Status: DC

## 2016-12-09 MED ORDER — VERAPAMIL HCL 2.5 MG/ML IV SOLN
INTRAVENOUS | Status: DC | PRN
  Administered 2016-12-09: 10 mL via INTRA_ARTERIAL

## 2016-12-09 MED ORDER — SODIUM CHLORIDE 0.9 % IV SOLN
250.0000 mL | INTRAVENOUS | Status: DC | PRN
Start: 2016-12-09 — End: 2016-12-09

## 2016-12-09 MED ORDER — ICOSAPENT ETHYL 1 G PO CAPS: 1.0000 g | ORAL_CAPSULE | Freq: Two times a day (BID) | ORAL | Status: DC

## 2016-12-09 MED ORDER — MIDAZOLAM HCL 2 MG/2ML IJ SOLN
INTRAMUSCULAR | Status: DC | PRN
  Administered 2016-12-09 (×2): 1 mg via INTRAVENOUS

## 2016-12-09 MED ORDER — ASPIRIN 81 MG PO CHEW
324.0000 mg | CHEWABLE_TABLET | Freq: Once | ORAL | Status: AC
Start: 2016-12-09 — End: 2016-12-09
  Administered 2016-12-09: 324 mg via ORAL
  Filled 2016-12-09: qty 4

## 2016-12-09 MED ORDER — LIDOCAINE HCL (PF) 1 % IJ SOLN
INTRAMUSCULAR | Status: DC | PRN
  Administered 2016-12-09: 2 mL via SUBCUTANEOUS

## 2016-12-09 MED ORDER — IOPAMIDOL (ISOVUE-370) INJECTION 76%
INTRAVENOUS | Status: DC | PRN
  Administered 2016-12-09: 70 mL via INTRA_ARTERIAL

## 2016-12-09 MED ORDER — ONDANSETRON HCL 4 MG/2ML IJ SOLN: 4.0000 mg | Freq: Four times a day (QID) | INTRAMUSCULAR | Status: DC | PRN

## 2016-12-09 MED ORDER — NITROGLYCERIN IN D5W 200-5 MCG/ML-% IV SOLN
5.0000 ug/min | Freq: Once | INTRAVENOUS | Status: AC
  Administered 2016-12-09: 5 ug/min via INTRAVENOUS
  Filled 2016-12-09: qty 250

## 2016-12-09 MED ORDER — SODIUM CHLORIDE 0.9% FLUSH: 3.0000 mL | Freq: Two times a day (BID) | INTRAVENOUS | Status: DC

## 2016-12-09 MED ORDER — SODIUM CHLORIDE 0.9 % IV SOLN
250.0000 mL | INTRAVENOUS | Status: DC | PRN
  Administered 2016-12-10: 250 mL via INTRAVENOUS

## 2016-12-09 MED ORDER — HEPARIN (PORCINE) IN NACL 2-0.9 UNIT/ML-% IJ SOLN
INTRAMUSCULAR | Status: AC | PRN
  Administered 2016-12-09: 1000 mL

## 2016-12-09 MED ORDER — SODIUM CHLORIDE 0.9 % WEIGHT BASED INFUSION: 1.0000 mL/kg/h | INTRAVENOUS | Status: DC

## 2016-12-09 MED ORDER — IOPAMIDOL (ISOVUE-370) INJECTION 76%
INTRAVENOUS | Status: AC
  Filled 2016-12-09: qty 100

## 2016-12-09 MED ORDER — ASPIRIN 81 MG PO CHEW
81.0000 mg | CHEWABLE_TABLET | ORAL | Status: AC
  Administered 2016-12-09: 81 mg via ORAL
  Filled 2016-12-09: qty 1

## 2016-12-09 MED ORDER — VERAPAMIL HCL 2.5 MG/ML IV SOLN
INTRAVENOUS | Status: AC
  Filled 2016-12-09: qty 2

## 2016-12-09 MED ORDER — ACETAMINOPHEN 325 MG PO TABS: 650.0000 mg | ORAL_TABLET | ORAL | Status: DC | PRN

## 2016-12-09 MED ORDER — HEPARIN (PORCINE) IN NACL 100-0.45 UNIT/ML-% IJ SOLN
1300.0000 [IU]/h | INTRAMUSCULAR | Status: DC
  Administered 2016-12-09: 1000 [IU]/h via INTRAVENOUS
  Filled 2016-12-09: qty 250

## 2016-12-09 MED ORDER — HEPARIN SODIUM (PORCINE) 1000 UNIT/ML IJ SOLN
INTRAMUSCULAR | Status: AC
  Filled 2016-12-09: qty 1

## 2016-12-09 MED ORDER — LISINOPRIL 20 MG PO TABS
20.0000 mg | ORAL_TABLET | Freq: Every day | ORAL | Status: DC
  Administered 2016-12-09 – 2016-12-10 (×2): 20 mg via ORAL
  Filled 2016-12-09 (×2): qty 1

## 2016-12-09 MED ORDER — LIDOCAINE HCL 1 % IJ SOLN
INTRAMUSCULAR | Status: AC
  Filled 2016-12-09: qty 20

## 2016-12-09 MED ORDER — PANTOPRAZOLE SODIUM 40 MG PO TBEC
40.0000 mg | DELAYED_RELEASE_TABLET | Freq: Every day | ORAL | Status: DC
  Administered 2016-12-10 (×2): 40 mg via ORAL
  Filled 2016-12-09 (×2): qty 1

## 2016-12-09 MED ORDER — NITROGLYCERIN IN D5W 200-5 MCG/ML-% IV SOLN: 5.0000 ug/min | INTRAVENOUS | Status: DC

## 2016-12-09 MED ORDER — SODIUM CHLORIDE 0.9 % WEIGHT BASED INFUSION: 3.0000 mL/kg/h | INTRAVENOUS | Status: DC

## 2016-12-09 MED ORDER — SODIUM CHLORIDE 0.9 % IV SOLN
INTRAVENOUS | Status: AC
  Administered 2016-12-09: 19:00:00 via INTRAVENOUS

## 2016-12-09 MED ORDER — HYDROMORPHONE HCL 1 MG/ML IJ SOLN
1.0000 mg | Freq: Once | INTRAMUSCULAR | Status: AC
  Administered 2016-12-09: 1 mg via INTRAVENOUS
  Filled 2016-12-09: qty 1

## 2016-12-09 MED ORDER — SODIUM CHLORIDE 0.9% FLUSH: 3.0000 mL | INTRAVENOUS | Status: DC | PRN

## 2016-12-09 MED ORDER — FENTANYL CITRATE (PF) 100 MCG/2ML IJ SOLN
INTRAMUSCULAR | Status: DC | PRN
  Administered 2016-12-09: 25 ug via INTRAVENOUS
  Administered 2016-12-09: 50 ug via INTRAVENOUS

## 2016-12-09 MED ORDER — METOPROLOL TARTRATE 25 MG PO TABS
25.0000 mg | ORAL_TABLET | Freq: Two times a day (BID) | ORAL | Status: DC
  Administered 2016-12-09 – 2016-12-10 (×3): 25 mg via ORAL
  Filled 2016-12-09 (×3): qty 1

## 2016-12-09 MED ORDER — ONDANSETRON HCL 4 MG/2ML IJ SOLN
4.0000 mg | Freq: Once | INTRAMUSCULAR | Status: AC
  Administered 2016-12-09: 4 mg via INTRAVENOUS
  Filled 2016-12-09: qty 2

## 2016-12-09 MED ORDER — TAPENTADOL HCL 50 MG PO TABS
25.0000 mg | ORAL_TABLET | Freq: Four times a day (QID) | ORAL | Status: DC
  Administered 2016-12-09 – 2016-12-10 (×5): 25 mg via ORAL
  Filled 2016-12-09 (×5): qty 1

## 2016-12-09 MED ORDER — HEPARIN (PORCINE) IN NACL 2-0.9 UNIT/ML-% IJ SOLN
INTRAMUSCULAR | Status: AC
  Filled 2016-12-09: qty 500

## 2016-12-09 MED ORDER — GABAPENTIN 400 MG PO CAPS
800.0000 mg | ORAL_CAPSULE | Freq: Three times a day (TID) | ORAL | Status: DC
  Administered 2016-12-09 – 2016-12-10 (×4): 800 mg via ORAL
  Filled 2016-12-09 (×4): qty 2

## 2016-12-09 MED ORDER — HEPARIN (PORCINE) IN NACL 2-0.9 UNIT/ML-% IJ SOLN
INTRAMUSCULAR | Status: AC
Start: 2016-12-09 — End: 2016-12-09
  Filled 2016-12-09: qty 500

## 2016-12-09 MED ORDER — HEPARIN SODIUM (PORCINE) 1000 UNIT/ML IJ SOLN
INTRAMUSCULAR | Status: DC | PRN
  Administered 2016-12-09: 4500 [IU] via INTRAVENOUS

## 2016-12-09 MED ORDER — MIDAZOLAM HCL 2 MG/2ML IJ SOLN
INTRAMUSCULAR | Status: AC
  Filled 2016-12-09: qty 2

## 2016-12-09 MED ORDER — NITROGLYCERIN 0.4 MG SL SUBL
0.4000 mg | SUBLINGUAL_TABLET | SUBLINGUAL | Status: DC | PRN
  Administered 2016-12-09 (×2): 0.4 mg via SUBLINGUAL
  Filled 2016-12-09 (×2): qty 1

## 2016-12-09 MED ORDER — HEPARIN BOLUS VIA INFUSION
4000.0000 [IU] | Freq: Once | INTRAVENOUS | Status: AC
  Administered 2016-12-09: 4000 [IU] via INTRAVENOUS

## 2016-12-09 MED ORDER — AMLODIPINE BESYLATE 5 MG PO TABS
5.0000 mg | ORAL_TABLET | Freq: Every day | ORAL | Status: DC
  Administered 2016-12-09 – 2016-12-10 (×2): 5 mg via ORAL
  Filled 2016-12-09 (×2): qty 1

## 2016-12-09 MED ORDER — MORPHINE SULFATE (PF) 4 MG/ML IV SOLN
4.0000 mg | Freq: Once | INTRAVENOUS | Status: AC
  Administered 2016-12-09: 4 mg via INTRAVENOUS
  Filled 2016-12-09: qty 1

## 2016-12-09 MED ORDER — ENOXAPARIN SODIUM 40 MG/0.4ML ~~LOC~~ SOLN: 40.0000 mg | SUBCUTANEOUS | Status: DC

## 2016-12-09 MED ORDER — HYDROMORPHONE HCL 1 MG/ML IJ SOLN
1.0000 mg | INTRAMUSCULAR | Status: DC | PRN
  Filled 2016-12-09: qty 1

## 2016-12-09 SURGICAL SUPPLY — 13 items
CATH INFINITI 5 FR JL3.5 (CATHETERS) ×1 IMPLANT
CATH INFINITI 5FR ANG PIGTAIL (CATHETERS) ×1 IMPLANT
CATH INFINITI JR4 5F (CATHETERS) ×1 IMPLANT
DEVICE RAD COMP TR BAND LRG (VASCULAR PRODUCTS) ×1 IMPLANT
GLIDESHEATH SLEND A-KIT 6F 22G (SHEATH) ×1 IMPLANT
GLIDESHEATH SLEND SS 6F .021 (SHEATH) ×1 IMPLANT
GUIDEWIRE INQWIRE 1.5J.035X260 (WIRE) IMPLANT
INQWIRE 1.5J .035X260CM (WIRE) ×2
KIT HEART LEFT (KITS) ×2 IMPLANT
PACK CARDIAC CATHETERIZATION (CUSTOM PROCEDURE TRAY) ×2 IMPLANT
SYR MEDRAD MARK V 150ML (SYRINGE) ×1 IMPLANT
TRANSDUCER W/STOPCOCK (MISCELLANEOUS) ×2 IMPLANT
TUBING CIL FLEX 10 FLL-RA (TUBING) ×2 IMPLANT

## 2016-12-09 NOTE — ED Notes (Signed)
Pt off unit via [facility] at this time 

## 2016-12-09 NOTE — H&P (Signed)
TRH H&P    Patient Demographics:    Mathew Brady, is a 51 y.o. male  MRN: [id number]  DOB - 12-Dec-1965  Admit Date - 12/09/2016  Referring MD/NP/PA: Dr Blinda Leatherwood  Outpatient Primary MD for the patient is Benita Stabile, MD  Patient coming from: Home  Chief Complaint  Patient presents with  . Chest Pain      HPI:    Mathew Brady  is a 51 y.o. male, With history of hypertension, hyperlipidemia who came to hospital with chest pain which became worse last night. Patient says that he has been having chest pain off and on over past few months. Patient recently had nuclear Myoview stress test which showed indeterminate risk, finding consistent with prior septal myocardial infarction with moderate peri-infarct ischemia. Patient was supposed to follow-up with cardiology on Wednesday, but came to hospital with worsening chest pain.. Patient describes pain as pressure, substernal with radiation to back. Associated with nausea but denies sweating tonight. He denies shortness of breath, but has been coughing up thick phlegm. Patient says that he has lost 30 pounds in weight over past 5 months. His previous CT scan of chest from Feb, 2018 showed 1 cm pulmonary nodule. He does have strong family history of both heart disease as well as lung cancer. He denies fever, dysuria. No vomiting or diarrhea.  In the ED patient was found to have negative troponin, EKG showed normal sinus rhythm with no ST changes. Patient started on heparin protocol, nitroglycerin infusion and pain has improved at this time. Cardiology fellow was consulted by ED physician who recommended medicine admission.    Review of systems:      A full 10 point Review of Systems was done, except as stated above, all other Review of Systems were negative.   With Past History of the following :    Past Medical History:  Diagnosis Date  . Acid reflux   . Anemia  06/18/2012  . Anxiety   . Arthritis   . Chest pain 06/17/2012   MI ruled out  . Chronic abdominal pain   . Chronic prescription benzodiazepine use   . Fibromyalgia   . Hypertension   . Hypertriglyceridemia   . Opiate use   . Palpitations   . Peripheral neuropathy   . Prediabetes 06/18/2012   resolved with weight loss  . Pulmonary nodule   . Rash 06/18/2012   Query eczema      Past Surgical History:  Procedure Laterality Date  . ESOPHAGEAL DILATION  1993      Social History:      Social History  Substance Use Topics  . Smoking status: Former Smoker    Packs/day: 2.00    Types: Cigarettes    Quit date: 03/09/2015  . Smokeless tobacco: Never Used     Comment: since age 48, has continued to smoke on/off  . Alcohol use No       Family History :     Family History  Problem Relation Age of Onset  . Fibromyalgia Mother   .  Bladder Cancer Father [id number]  . Benign prostatic hyperplasia Father   . Healthy Sister   . Healthy Son   . Heart failure Paternal Grandmother   . Heart failure Paternal Grandfather       Home Medications:   Prior to Admission medications   Medication Sig Start Date End Date Taking? Authorizing Provider  amLODipine (NORVASC) 5 MG tablet Take 5 mg by mouth daily. 08/19/16   [provider]  aspirin EC 81 MG tablet Take 1 tablet (81 mg total) by mouth daily. 11/28/16   Dunn, Tacey Ruizayna N, PA-C  cyanocobalamin (,VITAMIN B-12,) 1000 MCG/ML injection Inject 1 mL (1,000 mcg total) into the muscle once. Patient taking differently: Inject 1,000 mcg into the muscle every 30 (thirty) days.  06/12/15   Patel, Roxana Hiresonika K, DO  gabapentin (NEURONTIN) 800 MG tablet Take 1 tablet (800 mg total) by mouth 3 (three) times daily. 06/26/16   Dettinger, Elige RadonJoshua A, MD  HYDROcodone-acetaminophen (NORCO/VICODIN) 5-325 MG tablet Take 1 tablet by mouth every 6 (six) hours as needed. 11/18/16   [provider]  [facility] ER 40 MG T24A Take 1 tablet by mouth daily.  10/23/16   [provider]  lisinopril (PRINIVIL,ZESTRIL) 20 MG tablet Take 20 mg by mouth daily. 08/19/16   [provider]  meloxicam (MOBIC) 15 MG tablet Take 15 mg by mouth daily. 08/19/16   [provider]  metoprolol tartrate (LOPRESSOR) 25 MG tablet Take 1 tablet (25 mg total) by mouth 2 (two) times daily. 11/28/16 02/26/17  Laurann Montanaunn, Dayna N, PA-C  NUCYNTA ER 50 MG 12 hr tablet Take 50 mg by mouth 2 (two) times daily. 08/19/16   [provider]  VASCEPA 1 g CAPS Take 1 g by mouth 2 (two) times daily. 08/19/16   [provider]     Allergies:    No Known Allergies   Physical Exam:   Vitals  Blood pressure (!) 142/78, pulse (!) 58, temperature 98.5 F (36.9 C), temperature source Oral, resp. rate (!) 9, height 5\' 9"  (1.753 m), weight 84.4 kg (186 lb), SpO2 94 %.  1.  General: Appears in no acute distress  2. Psychiatric:  Intact judgement and  insight, awake alert, oriented x 3.  3. Neurologic: No focal neurological deficits, all cranial nerves intact.Strength 5/5 all 4 extremities, sensation intact all 4 extremities, plantars down going.  4. Eyes :  anicteric sclerae, moist conjunctivae with no lid lag. PERRLA.  5. ENMT:  Oropharynx clear with moist mucous membranes and good dentition  6. Neck:  supple, no cervical lymphadenopathy appriciated, No thyromegaly  7. Respiratory : Normal respiratory effort, good air movement bilaterally,clear to  auscultation bilaterally  8. Cardiovascular : RRR, no gallops, rubs or murmurs, no leg edema  9. Gastrointestinal:  Positive bowel sounds, abdomen soft, non-tender to palpation,no hepatosplenomegaly, no rigidity or guarding       10. Skin:  No cyanosis, normal texture and turgor, no rash, lesions or ulcers  11.Musculoskeletal:  Good muscle tone,  joints appear normal , no effusions,  normal range of motion    Data Review:    CBC  Recent Labs Lab 12/09/16 0152  WBC 9.6  HGB 15.0    HCT 44.6  PLT 237  MCV 95.5  MCH 32.1  MCHC 33.6  RDW 13.0  LYMPHSABS 4.1*  MONOABS 0.8  EOSABS 0.1  BASOSABS 0.0   ------------------------------------------------------------------------------------------------------------------  Chemistries   Recent Labs Lab 12/09/16 0152  NA 143  K 3.6  CL  104  CO2 29  GLUCOSE 109*  BUN 10  CREATININE 1.04  CALCIUM 9.3   ------------------------------------------------------------------------------------------------------------------  ------------------------------------------------------------------------------------------------------------------ GFR: Estimated Creatinine Clearance: 84 mL/min (by C-G formula based on SCr of 1.04 mg/dL). Liver Function Tests: No results for input(s): AST, ALT, ALKPHOS, BILITOT, PROT, ALBUMIN in the last 168 hours. No results for input(s): LIPASE, AMYLASE in the last 168 hours. No results for input(s): AMMONIA in the last 168 hours. Coagulation Profile:  Recent Labs Lab 12/09/16 0152  INR 0.90    --------------------------------------------------------------------------------------------------------------- Urine analysis:    Component Value Date/Time   COLORURINE YELLOW 04/20/2014 1141   APPEARANCEUR CLEAR 04/20/2014 1141   LABSPEC <1.005 (L) 04/20/2014 1141   PHURINE 7.0 04/20/2014 1141   GLUCOSEU NEGATIVE 04/20/2014 1141   HGBUR NEGATIVE 04/20/2014 1141   BILIRUBINUR NEGATIVE 04/20/2014 1141   KETONESUR NEGATIVE 04/20/2014 1141   PROTEINUR NEGATIVE 04/20/2014 1141   UROBILINOGEN 0.2 04/20/2014 1141   NITRITE NEGATIVE 04/20/2014 1141   LEUKOCYTESUR NEGATIVE 04/20/2014 1141      Imaging Results:    Dg Chest Port 1 View  Result Date: 12/09/2016 CLINICAL DATA:  Chest pain since 1 hour ago. EXAM: PORTABLE CHEST 1 VIEW COMPARISON:  08/15/2016 CT and CXR FINDINGS: The heart size and mediastinal contours are within normal limits. Both lungs are clear. The previously documented lingular  nodular opacity overlying the left lung base is partially obscured by the posterior left eighth rib. The visualized skeletal structures are unremarkable. IMPRESSION: No active disease. Obscured lingular nodule due to overlapping left eighth rib. Electronically Signed   By: Tollie Eth M.D.   On: 12/09/2016 02:17    My personal review of EKG: Rhythm NSR,No ST changes   Assessment & Plan:    Active Problems:   Benign hypertension   Hyperlipidemia LDL goal <130   Chest pain   1. Chest pain, rule out ACS- patient has ongoing chest pain, though it has improved after he was started on  heparin per pharmacy, nitroglycerin infusion. Cardiology recommends admit to medicine. They will see the patient in consult. Will cycle troponin every 6 hours 3, transfer patient to [facility] in case he needs cardiac catheterization and PCI. Continue aspirin 325 mg by mouth daily. Dilaudid when necessary for pain. 2. Hypertension- continue amlodipine and metoprolol. 3. Pulmonary nodule-patient has 30 pound weight loss, strong family history of lung cancer. CT chest showed 1 cm pulmonary nodule. He will need a workup for this as outpatient versus inpatient depending upon patient's clinical condition, above. 4. Hypertriglyceridemia- Continue Vascepa.  Patient will be transferred to [facility], Dr. Lyda Perone is accepting physician.  DVT Prophylaxis-   Heparin  AM Labs Ordered, also please review Full Orders  Family Communication: Admission, patients condition and plan of care including tests being ordered have been discussed with the patient and his wife at bedside who indicate understanding and agree with the plan and Code Status.  Code Status:  Full code  Admission status: Observation  Time spent in minutes : 60 min   Male Minish S M.D on 12/09/2016 at 4:01 AM  Between 7am to 7pm - Pager - [phone]. After 7pm go to [email or web address] - password [facility]  [facility] - Office   [phone]

## 2016-12-09 NOTE — Progress Notes (Signed)
ANTICOAGULATION CONSULT NOTE - Follow Up Consult  Pharmacy Consult for Heparin Indication: chest pain/ACS  No Known Allergies  Patient Measurements: Height: 5\' 9"  (175.3 cm) Weight: 193 lb 4.8 oz (87.7 kg) IBW/kg (Calculated) : 70.7 Heparin Dosing Weight: *84 kg  Vital Signs: Temp: 98 F (36.7 C) (06/04 1126) Temp Source: Oral (06/04 1126) BP: 139/85 (06/04 0557) Pulse Rate: 62 (06/04 1126)  Labs:  Recent Labs  12/09/16 0152 12/09/16 0602 12/09/16 1034  HGB 15.0  --   --   HCT 44.6  --   --   PLT 237  --   --   APTT 33  --   --   LABPROT 12.2  --   --   INR 0.90  --   --   HEPARINUNFRC  --   --  0.16*  CREATININE 1.04  --   --   TROPONINI  --  <0.03 <0.03    Estimated Creatinine Clearance: 92.1 mL/min (by C-G formula based on SCr of 1.04 mg/dL).  Assessment:   51 yr old male on IV heparin for ACS.   Initial heparin level is subtherapeutic (0.16) on 1000 units/hr.   No chest pain reported, but on IV Nitro.   For cardiac cath today.  Goal of Therapy:  Heparin level 0.3-0.7 units/ml Monitor platelets by anticoagulation protocol: Yes   Plan:   Increase heparin drip to 1300 units/hr.  No follow-up heparin level ordered yet, as cath planned later today.  Follow up post-cath.  Dennie Fettersgan, Cynia Abruzzo Donovan, RPh Pager: [phone] 12/09/2016,12:18 PM

## 2016-12-09 NOTE — ED Provider Notes (Signed)
[facility]-EMERGENCY DEPT Provider Note   CSN: [id number] Arrival date & time: 12/09/16  0138     History   Chief Complaint Chief Complaint  Patient presents with  . Chest Pain    HPI Mathew Brady is a 51 y.o. male.  Patient presents to the emergency department for evaluation of chest pain. Patient has been experiencing constant substernal chest pain radiating to the left shoulder blade, left shoulder. Symptoms began one hour ago. He has not had any exertional chest pain. He feels slightly short of breath. He did not perform any interventions at home prior to coming to the ER. He reports that he has been having pain on and off all weekend. He recently had a stress test performed that was abnormal, was called by the cardiologist to be seen in the office this week to discuss further testing.      Past Medical History:  Diagnosis Date  . Acid reflux   . Anemia 06/18/2012  . Anxiety   . Arthritis   . Chest pain 06/17/2012   MI ruled out  . Chronic abdominal pain   . Chronic prescription benzodiazepine use   . Fibromyalgia   . Hypertension   . Hypertriglyceridemia   . Opiate use   . Palpitations   . Peripheral neuropathy   . Prediabetes 06/18/2012   resolved with weight loss  . Pulmonary nodule   . Rash 06/18/2012   Query eczema    Patient Active Problem List   Diagnosis Date Noted  . Chest pain 08/25/2016  . Neurological complaint 08/25/2016  . Hyperlipidemia LDL goal <130 12/21/2015  . Neuropathy, peripheral 06/19/2015  . Anemia 06/18/2012  . Substance induced mood disorder (HCC) 02/14/2012  . Polysubstance dependence (HCC) 02/12/2012  . Sleep difficulties 02/11/2012  . Anxiety 01/27/2012  . Benign hypertension 01/27/2012  . Left-sided weakness 01/27/2012  . Opiate dependence (HCC) 01/27/2012  . Benzodiazepine dependence (HCC) 01/27/2012  . Dental caries 01/27/2012  . TOBACCO DEPENDENCE 09/04/2006  . MIGRAINE, UNSPEC., W/O INTRACTABLE MIGRAINE 09/04/2006  .  Fibromyalgia 09/04/2006    Past Surgical History:  Procedure Laterality Date  . ESOPHAGEAL DILATION  1993       Home Medications    Prior to Admission medications   Medication Sig Start Date End Date Taking? Authorizing Provider  amLODipine (NORVASC) 5 MG tablet Take 5 mg by mouth daily. 08/19/16   [provider]  aspirin EC 81 MG tablet Take 1 tablet (81 mg total) by mouth daily. 11/28/16   Dunn, Tacey Ruiz, PA-C  cyanocobalamin (,VITAMIN B-12,) 1000 MCG/ML injection Inject 1 mL (1,000 mcg total) into the muscle once. Patient taking differently: Inject 1,000 mcg into the muscle every 30 (thirty) days.  06/12/15   Patel, Roxana Hires K, DO  gabapentin (NEURONTIN) 800 MG tablet Take 1 tablet (800 mg total) by mouth 3 (three) times daily. 06/26/16   Dettinger, Elige Radon, MD  HYDROcodone-acetaminophen (NORCO/VICODIN) 5-325 MG tablet Take 1 tablet by mouth every 6 (six) hours as needed. 11/18/16   [provider]  [facility] ER 40 MG T24A Take 1 tablet by mouth daily. 10/23/16   [provider]  lisinopril (PRINIVIL,ZESTRIL) 20 MG tablet Take 20 mg by mouth daily. 08/19/16   [provider]  meloxicam (MOBIC) 15 MG tablet Take 15 mg by mouth daily. 08/19/16   [provider]  metoprolol tartrate (LOPRESSOR) 25 MG tablet Take 1 tablet (25 mg total) by mouth 2 (two) times daily. 11/28/16 02/26/17  Laurann Montana,  PA-C  NUCYNTA ER 50 MG 12 hr tablet Take 50 mg by mouth 2 (two) times daily. 08/19/16   [provider]  VASCEPA 1 g CAPS Take 1 g by mouth 2 (two) times daily. 08/19/16   [provider]    Family History Family History  Problem Relation Age of Onset  . Fibromyalgia Mother   . Bladder Cancer Father 30  . Benign prostatic hyperplasia Father   . Healthy Sister   . Healthy Son   . Heart failure Paternal Grandmother   . Heart failure Paternal Grandfather     Social History Social History  Substance Use Topics  . Smoking status:  Former Smoker    Packs/day: 2.00    Types: Cigarettes    Quit date: 03/09/2015  . Smokeless tobacco: Never Used     Comment: since age 89, has continued to smoke on/off  . Alcohol use No     Allergies   Patient has no known allergies.   Review of Systems Review of Systems  Respiratory: Positive for shortness of breath.   Cardiovascular: Positive for chest pain.  All other systems reviewed and are negative.    Physical Exam Updated Vital Signs BP (!) 142/86   Pulse 65   Temp 98.5 F (36.9 C) (Oral)   Resp (!) 9   Ht 5\' 9"  (1.753 m)   Wt 84.4 kg (186 lb)   SpO2 93%   BMI 27.47 kg/m   Physical Exam  Constitutional: He is oriented to person, place, and time. He appears well-developed and well-nourished. No distress.  HENT:  Head: Normocephalic and atraumatic.  Right Ear: Hearing normal.  Left Ear: Hearing normal.  Nose: Nose normal.  Mouth/Throat: Oropharynx is clear and moist and mucous membranes are normal.  Eyes: Conjunctivae and EOM are normal. Pupils are equal, round, and reactive to light.  Neck: Normal range of motion. Neck supple.  Cardiovascular: Regular rhythm, S1 normal and S2 normal.  Exam reveals no gallop and no friction rub.   No murmur heard. Pulmonary/Chest: Effort normal and breath sounds normal. No respiratory distress. He exhibits no tenderness.  Abdominal: Soft. Normal appearance and bowel sounds are normal. There is no hepatosplenomegaly. There is no tenderness. There is no rebound, no guarding, no tenderness at McBurney's point and negative Murphy's sign. No hernia.  Musculoskeletal: Normal range of motion.  Neurological: He is alert and oriented to person, place, and time. He has normal strength. No cranial nerve deficit or sensory deficit. Coordination normal. GCS eye subscore is 4. GCS verbal subscore is 5. GCS motor subscore is 6.  Skin: Skin is warm, dry and intact. No rash noted. No cyanosis.  Psychiatric: He has a normal mood and affect. His  speech is normal and behavior is normal. Thought content normal.  Nursing note and vitals reviewed.    ED Treatments / Results  Labs (all labs ordered are listed, but only abnormal results are displayed) Labs Reviewed  CBC WITH DIFFERENTIAL/PLATELET - Abnormal; Notable for the following:       Result Value   Lymphs Abs 4.1 (*)    All other components within normal limits  BASIC METABOLIC PANEL - Abnormal; Notable for the following:    Glucose, Bld 109 (*)    All other components within normal limits  I-STAT TROPOININ, ED    EKG  EKG Interpretation  Date/Time:  Monday December 09 2016 02:55:43 EDT Ventricular Rate:  57 PR Interval:    QRS Duration: 104 QT Interval:  446 QTC Calculation: 435 R Axis:   30 Text Interpretation:  Sinus rhythm RSR' in V1 or V2, probably normal variant Confirmed by Gilda Creaseollina, Quantay Zaremba J [phone]([id number]) on 12/09/2016 3:12:54 AM       Radiology Dg Chest Port 1 View  Result Date: 12/09/2016 CLINICAL DATA:  Chest pain since 1 hour ago. EXAM: PORTABLE CHEST 1 VIEW COMPARISON:  08/15/2016 CT and CXR FINDINGS: The heart size and mediastinal contours are within normal limits. Both lungs are clear. The previously documented lingular nodular opacity overlying the left lung base is partially obscured by the posterior left eighth rib. The visualized skeletal structures are unremarkable. IMPRESSION: No active disease. Obscured lingular nodule due to overlapping left eighth rib. Electronically Signed   By: Tollie Ethavid  Kwon M.D.   On: 12/09/2016 02:17    Procedures Procedures (including critical care time)  Medications Ordered in ED Medications  nitroGLYCERIN (NITROSTAT) SL tablet 0.4 mg (0.4 mg Sublingual Given 12/09/16 0209)  aspirin chewable tablet 324 mg (324 mg Oral Given 12/09/16 0202)  morphine 4 MG/ML injection 4 mg (4 mg Intravenous Given 12/09/16 0248)  ondansetron (ZOFRAN) injection 4 mg (4 mg Intravenous Given 12/09/16 0248)  nitroGLYCERIN 50 mg in dextrose 5 % 250 mL  (0.2 mg/mL) infusion (5 mcg/min Intravenous New Bag/Given 12/09/16 0253)     Initial Impression / Assessment and Plan / ED Course  I have reviewed the triage vital signs and the nursing notes.  Pertinent labs & imaging results that were available during my care of the patient were reviewed by me and considered in my medical decision making (see chart for details).     Patient presents to the emergency department for evaluation of chest pain. Patient has been having intermittent chest pain for 2 days. He was seen in cardiology this past week because he was having episodes of chest pain, had an outpatient nuclear medicine SPECT that was read as abnormal. He was scheduled to be seen in the office this week for follow-up and to discuss "further options". Before that visit has happened, however, patient has developed this chest pain.  His EKG does not show ischemia. His first troponin is negative. He has received nitroglycerin with improvement but he has not pain-free. He will be given morphine and placed on a nitroglycerin drip as well as heparin drip.  This was discussed with on-call cardiology fellow, Dr. Orson AloeHenderson. He did not accept the patient to the cardiology service because there was a mention of a pulmonary nodule in his previous CT that he felt needed to be worked up by medicine. I did discuss with him the fact that the patient had an abnormal stress test this week and is now having ongoing chest pain and likely needs a heart cath. Dr. Orson AloeHenderson felt that heart catheterization was not necessarily the next step and asked for medicine admission.  CRITICAL CARE Performed by: Gilda CreasePOLLINA, Brevan Luberto J.   Total critical care time: 30 minutes  Critical care time was exclusive of separately billable procedures and treating other patients.  Critical care was necessary to treat or prevent imminent or life-threatening deterioration.  Critical care was time spent personally by me on the following  activities: development of treatment plan with patient and/or surrogate as well as nursing, discussions with consultants, evaluation of patient's response to treatment, examination of patient, obtaining history from patient or surrogate, ordering and performing treatments and interventions, ordering and review of laboratory studies, ordering and review of radiographic studies, pulse oximetry and re-evaluation of patient's condition.  Final Clinical Impressions(s) / ED Diagnoses   Final diagnoses:  Unstable angina [facility])    New Prescriptions New Prescriptions   No medications on file     Gilda Crease, MD 12/09/16 [phone]

## 2016-12-09 NOTE — Interval H&P Note (Signed)
History and Physical Interval Note:  12/09/2016 4:52 PM  Mathew Brady  has presented today for cardiac catheterization, with the diagnosis of unstable angina. The various methods of treatment have been discussed with the patient and family. After consideration of risks, benefits and other options for treatment, the patient has consented to  Procedure(s): Left Heart Cath and Coronary Angiography (N/A) as a surgical intervention .  The patient's history has been reviewed, patient examined, no change in status, stable for surgery.  I have reviewed the patient's chart and labs.  Questions were answered to the patient's satisfaction.    Cath Lab Visit (complete for each Cath Lab visit)  Clinical Evaluation Leading to the Procedure:   ACS: Yes.    Non-ACS:    Anginal Classification: CCS IV  Anti-ischemic medical therapy: Maximal Therapy (2 or more classes of medications)  Non-Invasive Test Results: Intermediate-risk stress test findings: cardiac mortality 1-3%/year  Prior CABG: No previous CABG  Mathew Brady

## 2016-12-09 NOTE — H&P (View-Only) (Signed)
Cardiology Consultation:   Patient ID: Mathew Brady; [id number]; 07/12/1965   Admit date: 12/09/2016 Date of Consult: 12/09/2016  Primary Care Provider: Benita StabileHall, Keelan Z, MD Primary Cardiologist: New/ Primary Electrophysiologist:  None   Patient Profile:   Mathew Brady is a 51 y.o. male with a hx of chest pain who is being seen today for the evaluation of same at the request of Dr Roseanna RainbowGhimere.  History of Present Illness:   Mathew Brady is a 51 y.o. history of neuropathy, HTN, fibromyalgia, and anxiety. Smoker with history of pulmonary nodule. Last few months has had SSCP. Mostly exertional , center chest and when he gets busy. 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 Associated with palpitations. No dyspnea or syncope No LE edema. [facility] intervention said he had HTN vessels. Has grandparents on both sides with CAD/CHF. Parents alive and sister alive with no CAD. Still smokes occasionally Denies GI overtones or ETOH abuse   Myovue 12/05/16 prior septal infarct with moderate peri infarct ischemia EF 55-65% was to see [facility]ology [place]   Past Medical History:  Diagnosis Date  . Acid reflux   . Anemia 06/18/2012  . Anxiety   . Arthritis   . Chest pain 06/17/2012   MI ruled out  . Chronic abdominal pain   . Chronic prescription benzodiazepine use   . Fibromyalgia   . Hypertension   . Hypertriglyceridemia   . Opiate use   . Palpitations   . Peripheral neuropathy   . Prediabetes 06/18/2012   resolved with weight loss  . Pulmonary nodule   . Rash 06/18/2012   Query eczema    Past Surgical History:  Procedure Laterality Date  . ESOPHAGEAL DILATION  1993     Inpatient Medications: Scheduled Meds: . amLODipine  5 mg Oral Daily  . [START ON 12/10/2016] aspirin  325 mg Oral Daily  . gabapentin  800 mg Oral TID  . Icosapent Ethyl  1 g Oral BID  . lisinopril  20 mg Oral Daily  . metoprolol tartrate  25 mg Oral BID  . tapentadol  25 mg Oral Q6H    Continuous Infusions: . heparin 1,000 Units/hr (12/09/16 0422)  . nitroGLYCERIN     PRN Meds: acetaminophen, HYDROmorphone (DILAUDID) injection, ondansetron (ZOFRAN) IV  Allergies:   No Known Allergies  Social History:   Social History   Social History  . Marital status: Married    Spouse name: N/A  . Number of children: N/A  . 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 Smoking status: Former Smoker    Packs/day: 2.00    Types: Cigarettes    Quit date: 03/09/2015  . Smokeless tobacco: Never Used     Comment: since age 51, has continued to smoke on/off  . Alcohol use No  . Drug use: No  . Sexual activity: Yes    Partners: Female   Other Topics Concern  . Not on file   Social History Narrative   Lives with wife in a 2 story home.  Has 5 children all together.  Not working at this time.  Trying to get disability.  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   Education: Some high school.    Family History:   The patient's family history includes Benign prostatic hyperplasia in his father; Bladder Cancer (age of onset: 7872) in  his father; Fibromyalgia in his mother; Healthy in his sister and son; Heart failure in his paternal grandfather and paternal grandmother.  ROS:  Please see the history of present illness.  ROS  All other ROS reviewed and negative.     Physical Exam/Data:   Vitals:   12/09/16 0447 12/09/16 0456 12/09/16 0500 12/09/16 0557  BP: (!) 149/88  (!) 147/87 139/85  Pulse: (!) 55  (!) 57   Resp:    16  Temp:  98.1 F (36.7 C)  97.9 F (36.6 C)  TempSrc:    Oral  SpO2: 96%  95% 95%  Weight:    193 lb 4.8 oz (87.7 kg)  Height:    5\' 9"  (1.753 m)    Intake/Output Summary (Last 24 hours) at 12/09/16 0719 Last data filed at 12/09/16 0600  Gross per 24 hour  Intake            30.38 ml  Output              600 ml  Net          -569.62 ml   Filed Weights   12/09/16 0149  12/09/16 0557  Weight: 186 lb (84.4 kg) 193 lb 4.8 oz (87.7 kg)   Body mass index is 28.55 kg/m.  General:  Well nourished, well developed, in no acute distress  HEENT: normal Lymph: no adenopathy Neck: no JVD Endocrine:  No thryomegaly Vascular: No carotid bruits; FA pulses 2+ bilaterally without bruits  Cardiac:  normal S1, S2; RRR; no murmur   Lungs:  clear to auscultation bilaterally, no wheezing, rhonchi or rales  Abd: soft, nontender, no hepatomegaly  Ext: no edema Musculoskeletal:  No deformities, BUE and BLE strength normal and equal Skin: warm and dry  Neuro:  CNs 2-12 intact, no focal abnormalities noted Psych:  Normal affect    EKG:   NSR normal ECG   Relevant CV Studies: Myovue 12/05/16 abnormal see above   Laboratory Data:  Chemistry  Recent Labs Lab 12/09/16 0152  NA 143  K 3.6  CL 104  CO2 29  GLUCOSE 109*  BUN 10  CREATININE 1.04  CALCIUM 9.3  GFRNONAA >60  GFRAA >60  ANIONGAP 10    No results for input(s): PROT, ALBUMIN, AST, ALT, ALKPHOS, BILITOT in the last 168 hours. Hematology  Recent Labs Lab 12/09/16 0152  WBC 9.6  RBC 4.67  HGB 15.0  HCT 44.6  MCV 95.5  MCH 32.1  MCHC 33.6  RDW 13.0  PLT 237   Cardiac EnzymesNo results for input(s): TROPONINI in the last 168 hours.   Recent Labs Lab 12/09/16 0202  TROPIPOC 0.00    BNPNo results for input(s): BNP, PROBNP in the last 168 hours.  DDimer No results for input(s): DDIMER in the last 168 hours.  Radiology/Studies:  Nm Myocar Multi W/spect W/wall Motion / Ef  Result Date: 12/05/2016  There was no ST segment deviation noted during stress.  No T wave inversion was noted during stress.  Findings consistent with prior septal myocardial infarction with moderate peri-infarct ischemia.  This is an intermediate risk study.  The left ventricular ejection fraction is normal (55-65%).    Dg Chest Port 1 View  Result Date: 12/09/2016 CLINICAL DATA:  Chest pain since 1 hour ago.  EXAM: PORTABLE CHEST 1 VIEW COMPARISON:  08/15/2016 CT and CXR FINDINGS: The heart size and mediastinal contours are within normal limits. Both lungs are clear. The previously documented lingular nodular opacity overlying  the left lung base is partially obscured by the posterior left eighth rib. The visualized skeletal structures are unremarkable. IMPRESSION: No active disease. Obscured lingular nodule due to overlapping left eighth rib. Electronically Signed   By: Tollie Eth M.D.   On: 12/09/2016 02:17    Assessment and Plan:   1. Chest Pain: :  With abnormal myovue R/O ECG normal Discussed options with patient Feel diagnostic cath in order for definitive diagnosis. Risks discussed willing to proceed Continue iv nitro and heparin HR low no need for beta blocker Good radial pulse Lab called orders written 2. Palpitations: benign NSR continue telemetry 3. Smoking: with pulmonary nodule CXR on admission ok comment that lingular nodule obscured by overlapping left 8th rib 4. HTN:  Continue ACE, calcium blocker low sodium diet 5. Chol: LDL 94 on labs 12/21/15 will need Rx if found to have CAD   Signed, Charlton Haws, MD  12/09/2016 7:19 AM

## 2016-12-09 NOTE — Consult Note (Signed)
Cardiology Consultation:   Patient ID: Mathew Brady; [id number]; 07/12/1965   Admit date: 12/09/2016 Date of Consult: 12/09/2016  Primary Care Provider: Benita StabileHall, Keelan Z, MD Primary Cardiologist: New/Nishan Primary Electrophysiologist:  None   Patient Profile:   Mathew Brady is a 51 y.o. male with a hx of chest pain who is being seen today for the evaluation of same at the request of Dr Roseanna RainbowGhimere.  History of Present Illness:   Mathew Brady is a 51 y.o. history of neuropathy, HTN, fibromyalgia, and anxiety. Smoker with history of pulmonary nodule. Last few months has had SSCP. Mostly exertional , center chest and when he gets busy. 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 Associated with palpitations. No dyspnea or syncope No LE edema. [facility] said he had HTN vessels. Has grandparents on both sides with CAD/CHF. Parents alive and sister alive with no CAD. Still smokes occasionally Denies GI overtones or ETOH abuse   Myovue 12/05/16 prior septal infarct with moderate peri infarct ischemia EF 55-65% was to see [facility] cardiology [place]   Past Medical History:  Diagnosis Date  . Acid reflux   . Anemia 06/18/2012  . Anxiety   . Arthritis   . Chest pain 06/17/2012   MI ruled out  . Chronic abdominal pain   . Chronic prescription benzodiazepine use   . Fibromyalgia   . Hypertension   . Hypertriglyceridemia   . Opiate use   . Palpitations   . Peripheral neuropathy   . Prediabetes 06/18/2012   resolved with weight loss  . Pulmonary nodule   . Rash 06/18/2012   Query eczema    Past Surgical History:  Procedure Laterality Date  . ESOPHAGEAL DILATION  1993     Inpatient Medications: Scheduled Meds: . amLODipine  5 mg Oral Daily  . [START ON 12/10/2016] aspirin  325 mg Oral Daily  . gabapentin  800 mg Oral TID  . Icosapent Ethyl  1 g Oral BID  . lisinopril  20 mg Oral Daily  . metoprolol tartrate  25 mg Oral BID  . tapentadol  25 mg Oral Q6H    Continuous Infusions: . heparin 1,000 Units/hr (12/09/16 0422)  . nitroGLYCERIN     PRN Meds: acetaminophen, HYDROmorphone (DILAUDID) injection, ondansetron (ZOFRAN) IV  Allergies:   No Known Allergies  Social History:   Social History   Social History  . Marital status: Married    Spouse name: N/A  . Number of children: N/A  . 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 Smoking status: Former Smoker    Packs/day: 2.00    Types: Cigarettes    Quit date: 03/09/2015  . Smokeless tobacco: Never Used     Comment: since age 51, has continued to smoke on/off  . Alcohol use No  . Drug use: No  . Sexual activity: Yes    Partners: Female   Other Topics Concern  . Not on file   Social History Narrative   Lives with wife in a 2 story home.  Has 5 children all together.  Not working at this time.  Trying to get disability.  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   Education: Some high school.    Family History:   The patient's family history includes Benign prostatic hyperplasia in his father; Bladder Cancer (age of onset: 7872) in  his father; Fibromyalgia in his mother; Healthy in his sister and son; Heart failure in his paternal grandfather and paternal grandmother.  ROS:  Please see the history of present illness.  ROS  All other ROS reviewed and negative.     Physical Exam/Data:   Vitals:   12/09/16 0447 12/09/16 0456 12/09/16 0500 12/09/16 0557  BP: (!) 149/88  (!) 147/87 139/85  Pulse: (!) 55  (!) 57   Resp:    16  Temp:  98.1 F (36.7 C)  97.9 F (36.6 C)  TempSrc:    Oral  SpO2: 96%  95% 95%  Weight:    193 lb 4.8 oz (87.7 kg)  Height:    5\' 9"  (1.753 m)    Intake/Output Summary (Last 24 hours) at 12/09/16 0719 Last data filed at 12/09/16 0600  Gross per 24 hour  Intake            30.38 ml  Output              600 ml  Net          -569.62 ml   Filed Weights   12/09/16 0149  12/09/16 0557  Weight: 186 lb (84.4 kg) 193 lb 4.8 oz (87.7 kg)   Body mass index is 28.55 kg/m.  General:  Well nourished, well developed, in no acute distress  HEENT: normal Lymph: no adenopathy Neck: no JVD Endocrine:  No thryomegaly Vascular: No carotid bruits; FA pulses 2+ bilaterally without bruits  Cardiac:  normal S1, S2; RRR; no murmur   Lungs:  clear to auscultation bilaterally, no wheezing, rhonchi or rales  Abd: soft, nontender, no hepatomegaly  Ext: no edema Musculoskeletal:  No deformities, BUE and BLE strength normal and equal Skin: warm and dry  Neuro:  CNs 2-12 intact, no focal abnormalities noted Psych:  Normal affect    EKG:   NSR normal ECG   Relevant CV Studies: Myovue 12/05/16 abnormal see above   Laboratory Data:  Chemistry  Recent Labs Lab 12/09/16 0152  NA 143  K 3.6  CL 104  CO2 29  GLUCOSE 109*  BUN 10  CREATININE 1.04  CALCIUM 9.3  GFRNONAA >60  GFRAA >60  ANIONGAP 10    No results for input(s): PROT, ALBUMIN, AST, ALT, ALKPHOS, BILITOT in the last 168 hours. Hematology  Recent Labs Lab 12/09/16 0152  WBC 9.6  RBC 4.67  HGB 15.0  HCT 44.6  MCV 95.5  MCH 32.1  MCHC 33.6  RDW 13.0  PLT 237   Cardiac EnzymesNo results for input(s): TROPONINI in the last 168 hours.   Recent Labs Lab 12/09/16 0202  TROPIPOC 0.00    BNPNo results for input(s): BNP, PROBNP in the last 168 hours.  DDimer No results for input(s): DDIMER in the last 168 hours.  Radiology/Studies:  Nm Myocar Multi W/spect W/wall Motion / Ef  Result Date: 12/05/2016  There was no ST segment deviation noted during stress.  No T wave inversion was noted during stress.  Findings consistent with prior septal myocardial infarction with moderate peri-infarct ischemia.  This is an intermediate risk study.  The left ventricular ejection fraction is normal (55-65%).    Dg Chest Port 1 View  Result Date: 12/09/2016 CLINICAL DATA:  Chest pain since 1 hour ago.  EXAM: PORTABLE CHEST 1 VIEW COMPARISON:  08/15/2016 CT and CXR FINDINGS: The heart size and mediastinal contours are within normal limits. Both lungs are clear. The previously documented lingular nodular opacity overlying  the left lung base is partially obscured by the posterior left eighth rib. The visualized skeletal structures are unremarkable. IMPRESSION: No active disease. Obscured lingular nodule due to overlapping left eighth rib. Electronically Signed   By: Tollie Eth M.D.   On: 12/09/2016 02:17    Assessment and Plan:   1. Chest Pain: :  With abnormal myovue R/O ECG normal Discussed options with patient Feel diagnostic cath in order for definitive diagnosis. Risks discussed willing to proceed Continue iv nitro and heparin HR low no need for beta blocker Good radial pulse Lab called orders written 2. Palpitations: benign NSR continue telemetry 3. Smoking: with pulmonary nodule CXR on admission ok comment that lingular nodule obscured by overlapping left 8th rib 4. HTN:  Continue ACE, calcium blocker low sodium diet 5. Chol: LDL 94 on labs 12/21/15 will need Rx if found to have CAD   Signed, Charlton Haws, MD  12/09/2016 7:19 AM

## 2016-12-09 NOTE — ED Triage Notes (Signed)
Pt c/o chest pain that started x 1 hour ago; pt states the pain radiates to the left arm and between shoulder blades

## 2016-12-09 NOTE — Progress Notes (Signed)
[facility] No signs of immediate distress noted. Pt is on RA. Skin is WNL. Nitroglycerine gtt and Heparin gtt infusing. Will continue to monitor.

## 2016-12-09 NOTE — Progress Notes (Signed)
PROGRESS NOTE        PATIENT DETAILS Name: Mathew Brady Age: 51 y.o. Sex: male Date of Birth: [birth year] Admit Date: 12/09/2016 Admitting Physician Meredeth Ide, MD ZOX:[id number], Kathleene Hazel, MD  Brief Narrative: Patient is a 51 y.o. male with history of tobacco abuse, hypertension admitted for evaluation of chest pain, recent nuclear stress test positive for ischemia. See below for further details  Subjective: Lying comfortably in bed-no chest pain.  Assessment/Plan: Unstable angina:recent abnormal nuclear stress test-evaluated by cardiology-for LHC later today. In the meantime, continue with IV heparin, IV nitroglycerin infusion, metoprolol, aspirin. Await LHC and further recommendations from cardiology.  Hypertension: Blood pressure controlled-continue with lisinopril, metoprolol and IV nitroglycerin infusion. We'll follow and optimized accordingly.  Pulmonary nodule: Per patient's mother-this is a chronic issue and has been going on for years-however unclear if this has increased in size-have advised that patient follow with PCP for outpatient PET scan. Patient has a long-standing history of tobacco use.   Hypertriglyceridemia: For now continue with vascepa-if found to have CAD-probably will need to be on statins as well.  Tobacco abuse: Counseled importance of completely stopping-claims he has cut down only smokes "occasionally".  Telemetry (independently reviewed):NSR  New Imaging studies (independently reviewed): Chest x-ray on 6/4-no pneumonia   Morning labs/Imaging ordered: yes  DVT Prophylaxis: Full dose anticoagulation with Heparin  Code Status: Full code   Family Communication: Spouse/mother at bedside  Disposition Plan: Remain inpatient-home when work up complete  Antimicrobial agents: Anti-infectives    None      Procedures: None  CONSULTS:  cardiology  Time spent: 25- minutes-Greater than 50% of this time was spent in counseling,  explanation of diagnosis, planning of further management, and coordination of care.  MEDICATIONS: Scheduled Meds: . amLODipine  5 mg Oral Daily  . [START ON 12/10/2016] aspirin  325 mg Oral Daily  . gabapentin  800 mg Oral TID  . Icosapent Ethyl  1 g Oral BID  . lisinopril  20 mg Oral Daily  . metoprolol tartrate  25 mg Oral BID  . tapentadol  25 mg Oral Q6H   Continuous Infusions: . heparin 1,000 Units/hr (12/09/16 0422)  . nitroGLYCERIN     PRN Meds:.acetaminophen, HYDROmorphone (DILAUDID) injection, ondansetron (ZOFRAN) IV   PHYSICAL EXAM: Vital signs: Vitals:   12/09/16 0456 12/09/16 0500 12/09/16 0557 12/09/16 0727  BP:  (!) 147/87 139/85   Pulse:  (!) 57  (!) 58  Resp:   16 15  Temp: 98.1 F (36.7 C)  97.9 F (36.6 C) 97.8 F (36.6 C)  TempSrc:   Oral Oral  SpO2:  95% 95% 97%  Weight:   87.7 kg (193 lb 4.8 oz)   Height:   5\' 9"  (1.753 m)    Filed Weights   12/09/16 0149 12/09/16 0557  Weight: 84.4 kg (186 lb) 87.7 kg (193 lb 4.8 oz)   Body mass index is 28.55 kg/m.   General appearance :Awake, alert, not in any distress.  Eyes:, pupils equally reactive to light and accomodation HEENT: Atraumatic and Normocephalic Neck: supple, no JVD.  Resp:Good air entry bilaterally, no added sounds  CVS: S1 S2 regular, no murmurs.  GI: Bowel sounds present, Non tender and not distended with no gaurding, rigidity or rebound.No organomegaly Extremities: B/L Lower Ext shows no edema, both legs are warm to touch Neurology:  speech clear,Non focal, sensation is grossly intact. Psychiatric: Normal judgment and insight. Alert and oriented x 3. Normal mood. Musculoskeletal:No digital cyanosis Skin:No Rash, warm and dry Wounds:N/A  I have personally reviewed following labs and imaging studies  LABORATORY DATA: CBC:  Recent Labs Lab 12/09/16 0152  WBC 9.6  NEUTROABS 4.6  HGB 15.0  HCT 44.6  MCV 95.5  PLT 237    Basic Metabolic Panel:  Recent Labs Lab  12/09/16 0152  NA 143  K 3.6  CL 104  CO2 29  GLUCOSE 109*  BUN 10  CREATININE 1.04  CALCIUM 9.3    GFR: Estimated Creatinine Clearance: 92.1 mL/min (by C-G formula based on SCr of 1.04 mg/dL).  Liver Function Tests: No results for input(s): AST, ALT, ALKPHOS, BILITOT, PROT, ALBUMIN in the last 168 hours. No results for input(s): LIPASE, AMYLASE in the last 168 hours. No results for input(s): AMMONIA in the last 168 hours.  Coagulation Profile:  Recent Labs Lab 12/09/16 0152  INR 0.90    Cardiac Enzymes:  Recent Labs Lab 12/09/16 0602  TROPONINI <0.03    BNP (last 3 results) No results for input(s): PROBNP in the last 8760 hours.  HbA1C: No results for input(s): HGBA1C in the last 72 hours.  CBG: No results for input(s): GLUCAP in the last 168 hours.  Lipid Profile: No results for input(s): CHOL, HDL, LDLCALC, TRIG, CHOLHDL, LDLDIRECT in the last 72 hours.  Thyroid Function Tests: No results for input(s): TSH, T4TOTAL, FREET4, T3FREE, THYROIDAB in the last 72 hours.  Anemia Panel: No results for input(s): VITAMINB12, FOLATE, FERRITIN, TIBC, IRON, RETICCTPCT in the last 72 hours.  Urine analysis:    Component Value Date/Time   COLORURINE YELLOW 04/20/2014 1141   APPEARANCEUR CLEAR 04/20/2014 1141   LABSPEC <1.005 (L) 04/20/2014 1141   PHURINE 7.0 04/20/2014 1141   GLUCOSEU NEGATIVE 04/20/2014 1141   HGBUR NEGATIVE 04/20/2014 1141   BILIRUBINUR NEGATIVE 04/20/2014 1141   KETONESUR NEGATIVE 04/20/2014 1141   PROTEINUR NEGATIVE 04/20/2014 1141   UROBILINOGEN 0.2 04/20/2014 1141   NITRITE NEGATIVE 04/20/2014 1141   LEUKOCYTESUR NEGATIVE 04/20/2014 1141    Sepsis Labs: Lactic Acid, Venous No results found for: LATICACIDVEN  MICROBIOLOGY: Recent Results (from the past 240 hour(s))  MRSA PCR Screening     Status: None   Collection Time: 12/09/16  6:15 AM  Result Value Ref Range Status   MRSA by PCR NEGATIVE NEGATIVE Final    Comment:         The GeneXpert MRSA Assay (FDA approved for NASAL specimens only), is one component of a comprehensive MRSA colonization surveillance program. It is not intended to diagnose MRSA infection nor to guide or monitor treatment for MRSA infections.     RADIOLOGY STUDIES/RESULTS: [place] Carotid Bilateral  Result Date: 12/04/2016 CLINICAL DATA:  Carotid artery disease follow-up. History of CAD, hypertension, visual disturbance, hyperlipidemia, diabetes and smoking. EXAM: BILATERAL CAROTID DUPLEX ULTRASOUND TECHNIQUE: Wallace CullensGray scale imaging, color Doppler and duplex ultrasound were performed of bilateral carotid and vertebral arteries in the neck. COMPARISON:  Carotid Doppler ultrasound -01/27/2012 FINDINGS: Criteria: Quantification of carotid stenosis is based on velocity parameters that correlate the residual internal carotid diameter with NASCET-based stenosis levels, using the diameter of the distal internal carotid lumen as the denominator for stenosis measurement. The following velocity measurements were obtained: RIGHT ICA:  97/37 cm/sec CCA:  135/28 cm/sec SYSTOLIC ICA/CCA RATIO:  0.7 DIASTOLIC ICA/CCA RATIO:  1.4 ECA:  126 cm/sec LEFT ICA:  116/43 cm/sec CCA:  131/34 cm/sec SYSTOLIC ICA/CCA RATIO:  0.9 DIASTOLIC ICA/CCA RATIO:  1.4 ECA:  144 cm/sec RIGHT CAROTID ARTERY: There is a minimal amount of eccentric mixed echogenic plaque involving the origin and proximal aspects of the right internal carotid artery (image 24), progressed compared to the 01/2012 examination, though again not resulting in elevated peak systolic velocities within the interrogated course the right internal carotid artery to suggest a hemodynamically significant stenosis. RIGHT VERTEBRAL ARTERY:  Antegrade flow LEFT CAROTID ARTERY: There is a minimal amount of intimal thickening within the left carotid bulb (image 50), extending to involve the origin and proximal aspect the left internal carotid artery (image 58), minimally progressed  compared to the 01/2012 examination though not resulting in elevated peak systolic velocities within the interrogated course of the left internal carotid artery to suggest a hemodynamically significant stenosis. LEFT VERTEBRAL ARTERY:  Antegrade flow IMPRESSION: Minimal amount of bilateral intimal thickening and atherosclerotic plaque, left subjectively greater than right, minimally progressed compared to the 01/2012 examination though not resulting in a hemodynamically significant stenosis within either internal carotid artery. Electronically Signed   By: Simonne Come M.D.   On: 12/04/2016 14:32   Nm Myocar Multi W/spect W/wall Motion / Ef  Result Date: 12/05/2016  There was no ST segment deviation noted during stress.  No T wave inversion was noted during stress.  Findings consistent with prior septal myocardial infarction with moderate peri-infarct ischemia.  This is an intermediate risk study.  The left ventricular ejection fraction is normal (55-65%).    Dg Chest Port 1 View  Result Date: 12/09/2016 CLINICAL DATA:  Chest pain since 1 hour ago. EXAM: PORTABLE CHEST 1 VIEW COMPARISON:  08/15/2016 CT and CXR FINDINGS: The heart size and mediastinal contours are within normal limits. Both lungs are clear. The previously documented lingular nodular opacity overlying the left lung base is partially obscured by the posterior left eighth rib. The visualized skeletal structures are unremarkable. IMPRESSION: No active disease. Obscured lingular nodule due to overlapping left eighth rib. Electronically Signed   By: Tollie Eth M.D.   On: 12/09/2016 02:17     LOS: 0 days   Jeoffrey Massed, MD  [facility] Pager:[phone]  If 7PM-7AM, please contact night-coverage [email or web address] Password [facility] 12/09/2016, 9:36 AM

## 2016-12-09 NOTE — Progress Notes (Signed)
ANTICOAGULATION CONSULT NOTE - Preliminary  Pharmacy Consult for Heparin Indication: ACS/STEMI  No Known Allergies  Patient Measurements: Height: 5\' 9"  (175.3 cm) Weight: 186 lb (84.4 kg) IBW/kg (Calculated) : 70.7 HEPARIN DW (KG): 84.4   Vital Signs: Temp: 98.5 F (36.9 C) (06/04 0150) Temp Source: Oral (06/04 0150) BP: 142/78 (06/04 0300) Pulse Rate: 58 (06/04 0300)  Labs:  Recent Labs  12/09/16 0152  HGB 15.0  HCT 44.6  PLT 237  CREATININE 1.04   Estimated Creatinine Clearance: 84 mL/min (by C-G formula based on SCr of 1.04 mg/dL).  Medical History: Past Medical History:  Diagnosis Date  . Acid reflux   . Anemia 06/18/2012  . Anxiety   . Arthritis   . Chest pain 06/17/2012   MI ruled out  . Chronic abdominal pain   . Chronic prescription benzodiazepine use   . Fibromyalgia   . Hypertension   . Hypertriglyceridemia   . Opiate use   . Palpitations   . Peripheral neuropathy   . Prediabetes 06/18/2012   resolved with weight loss  . Pulmonary nodule   . Rash 06/18/2012   Query eczema    Medications:   Assessment: 51 yo male seen in the ED for chest pain radiating to left shoulder; somewhat relieved by SL nitroglycerin. Pt recently had abnormal stress test which was to be followed up with cardiology this week. Pharmacy is to start heparin infusion.   Goal of Therapy:  Heparin level goal: 0.3-0.7 unit/ml Monitor platelets by anticoagulation protocol: Yes   Plan:  Heparin IV loading dose: 4000 units  Heparin IV infusion: 1000 units/hr Preliminary review of pertinent patient information completed.  Jeani HawkingAnnie Penn clinical pharmacist will complete review during morning rounds to assess the patient and finalize treatment regimen.  Arelia SneddonMason, Mykle [place], [facility] Hospital[facility]PH 12/09/2016,3:20 AM

## 2016-12-10 ENCOUNTER — Encounter (HOSPITAL_COMMUNITY): Payer: Self-pay | Admitting: Internal Medicine

## 2016-12-10 ENCOUNTER — Telehealth: Payer: Self-pay | Admitting: Pulmonary Disease

## 2016-12-10 DIAGNOSIS — I200 Unstable angina: Secondary | ICD-10-CM | POA: Diagnosis not present

## 2016-12-10 DIAGNOSIS — R079 Chest pain, unspecified: Secondary | ICD-10-CM | POA: Diagnosis not present

## 2016-12-10 DIAGNOSIS — I10 Essential (primary) hypertension: Secondary | ICD-10-CM | POA: Diagnosis not present

## 2016-12-10 DIAGNOSIS — E785 Hyperlipidemia, unspecified: Secondary | ICD-10-CM | POA: Diagnosis not present

## 2016-12-10 LAB — CBC
HEMATOCRIT: 40.2 % (ref 39.0–52.0)
Hemoglobin: 13.3 g/dL (ref 13.0–17.0)
MCH: 31.5 pg (ref 26.0–34.0)
MCHC: 33.1 g/dL (ref 30.0–36.0)
MCV: 95.3 fL (ref 78.0–100.0)
Platelets: 232 10*3/uL (ref 150–400)
RBC: 4.22 MIL/uL (ref 4.22–5.81)
RDW: 13.2 % (ref 11.5–15.5)
WBC: 14.5 10*3/uL — ABNORMAL HIGH (ref 4.0–10.5)

## 2016-12-10 MED ORDER — PANTOPRAZOLE SODIUM 40 MG PO TBEC: 40.0000 mg | DELAYED_RELEASE_TABLET | Freq: Every day | ORAL | 0 refills | Status: DC

## 2016-12-10 NOTE — Plan of Care (Signed)
Problem: Pain Managment: Goal: General experience of comfort will improve Outcome: Progressing Patient resting comfortably.  Current pain medications effective in controlling patient's reported pain.  Progressing towards goal.

## 2016-12-10 NOTE — Progress Notes (Signed)
Progress Note  Patient Name: Mathew Brady Date of Encounter: 12/10/2016  Primary Cardiologist: Eden EmmsNishan  Subjective   No pain arm feels fine   Inpatient Medications    Scheduled Meds: . amLODipine  5 mg Oral Daily  . aspirin  81 mg Oral Daily  . enoxaparin (LOVENOX) injection  40 mg Subcutaneous Q24H  . gabapentin  800 mg Oral TID  . lisinopril  20 mg Oral Daily  . metoprolol tartrate  25 mg Oral BID  . pantoprazole  40 mg Oral Daily  . sodium chloride flush  3 mL Intravenous Q12H  . tapentadol  25 mg Oral Q6H   Continuous Infusions: . sodium chloride 250 mL (12/10/16 0156)  . nitroGLYCERIN Stopped (12/10/16 0812)   PRN Meds: sodium chloride, acetaminophen, HYDROmorphone (DILAUDID) injection, ondansetron (ZOFRAN) IV, sodium chloride flush   Vital Signs    Vitals:   12/09/16 1847 12/09/16 2041 12/10/16 0038 12/10/16 0431  BP: 124/81 116/70 115/67 (!) 147/85  Pulse: (!) 58 (!) 54 (!) 56 86  Resp: 14 14 16    Temp: 97.5 F (36.4 C) 98.6 F (37 C) 98 F (36.7 C) 98.8 F (37.1 C)  TempSrc: Oral Oral Oral Oral  SpO2: 90% 99% 99% 100%  Weight:    186 lb 4.8 oz (84.5 kg)  Height:        Intake/Output Summary (Last 24 hours) at 12/10/16 1042 Last data filed at 12/10/16 0500  Gross per 24 hour  Intake                0 ml  Output              700 ml  Net             -700 ml   Filed Weights   12/09/16 0149 12/09/16 0557 12/10/16 0431  Weight: 186 lb (84.4 kg) 193 lb 4.8 oz (87.7 kg) 186 lb 4.8 oz (84.5 kg)    Telemetry    NSR 12/10/2016  - Personally Reviewed  ECG    NSR no acute ST changes  - Personally Reviewed  Physical Exam  Right radial artery A  GEN: No acute distress.   Neck: No JVD Cardiac: RRR, no murmurs, rubs, or gallops.  Respiratory: Clear to auscultation bilaterally. GI: Soft, nontender, non-distended  MS: No edema; No deformity. Neuro:  Nonfocal  Psych: Normal affect   Labs    Chemistry Recent Labs Lab 12/09/16 0152  NA 143  K 3.6    CL 104  CO2 29  GLUCOSE 109*  BUN 10  CREATININE 1.04  CALCIUM 9.3  GFRNONAA >60  GFRAA >60  ANIONGAP 10     Hematology Recent Labs Lab 12/09/16 0152 12/10/16 0409  WBC 9.6 14.5*  RBC 4.67 4.22  HGB 15.0 13.3  HCT 44.6 40.2  MCV 95.5 95.3  MCH 32.1 31.5  MCHC 33.6 33.1  RDW 13.0 13.2  PLT 237 232    Cardiac Enzymes Recent Labs Lab 12/09/16 0602 12/09/16 1034 12/09/16 1807  TROPONINI <0.03 <0.03 <0.03    Recent Labs Lab 12/09/16 0202  TROPIPOC 0.00     BNPNo results for input(s): BNP, PROBNP in the last 168 hours.   DDimer No results for input(s): DDIMER in the last 168 hours.   Radiology    Dg Chest Port 1 View  Result Date: 12/09/2016 CLINICAL DATA:  Chest pain since 1 hour ago. EXAM: PORTABLE CHEST 1 VIEW COMPARISON:  08/15/2016 CT and CXR FINDINGS: The  heart size and mediastinal contours are within normal limits. Both lungs are clear. The previously documented lingular nodular opacity overlying the left lung base is partially obscured by the posterior left eighth rib. The visualized skeletal structures are unremarkable. IMPRESSION: No active disease. Obscured lingular nodule due to overlapping left eighth rib. Electronically Signed   By: Tollie Eth M.D.   On: 12/09/2016 02:17    Cardiac Studies   Cath:  Reviewed films no epicardial CAD  Patient Profile     51 y.o. male with chest pain cath with normal coronary arteries  Assessment & Plan    1) Chest pain: non cardiac right radial A f/u primary 2) Palpitations: benign no arrhythmnia on monitor.  3) Smoking discussed cessation needs f/u of pulmonary nodule in lingular 4) HTN:  Well controlled.  Continue current medications and low sodium Dash type diet.   5) Cholesterol at goal with no CAD LDL 94   Signed, Charlton Haws, MD  12/10/2016, 10:42 AM

## 2016-12-10 NOTE — Discharge Instructions (Signed)

## 2016-12-10 NOTE — Telephone Encounter (Signed)
What is he being referred for?

## 2016-12-10 NOTE — Telephone Encounter (Signed)
I can see him at 8:45am on Friday if we can open a 30 min slot for him. Thanks.

## 2016-12-10 NOTE — Telephone Encounter (Signed)
Referred for Lung Nodule.  Diagnosed from CT Angio 08/26/16 (in Epic)

## 2016-12-10 NOTE — Telephone Encounter (Signed)
Spoke with pt's wife, will arrive at 8:30 to fill out paperwork for an 8:45 appt on Friday.  Nothing further needed.

## 2016-12-10 NOTE — Telephone Encounter (Signed)
Please advise Dr Jamison NeighborNestor if able to work in sooner than first available 01/24/17.  Pt requesting to see JN as he treats her Mother.   JN please advise. Thanks.

## 2016-12-10 NOTE — Discharge Summary (Signed)
PATIENT DETAILS Name: Mathew Brady Age: 51 y.o. Sex: male Date of Birth: [birth year] MRN: [id number]. Admitting Physician: Meredeth Ide, MD [id number], Kathleene Hazel, MD  Admit Date: 12/09/2016 Discharge date: 12/10/2016  Recommendations for Outpatient Follow-up:  1. Follow up with PCP in 1 weeks 2. Please obtain BMP/CBC in one week 3. Consider initiating statins at next follow-up with PCP 4. Has a 1 cm right lung nodule on CT chest done on February 2018-suggest that patient undergo a outpatient PET scan-may need further workup if positive. Patient has a long-standing history of tobacco use   Admitted From:  Home  Disposition: Home   Home Health: No  Equipment/Devices: None  Discharge Condition: Stable  CODE STATUS: FULL CODE  Diet recommendation:  Heart Healthy   Brief Summary: See H&P, Labs, Consult and Test reports for all details in brief, Patient is a 51 y.o. male with history of tobacco abuse, hypertension admitted for evaluation of chest pain, recent nuclear stress test positive for ischemia. See below for further details  Brief Hospital Course: Chest pain:recent abnormal nuclear stress test-evaluated by cardiology-underwent LHC on 6/5 -which showed nonobstructive CAD involving the second diagonal branch and proximal RCA. Patient is to stop smoking (counseled extensively), continue aspirin, beta blocker on discharge-consider initiation of beta blockers upon follow-up with PCP. Chest pain probably GI related-started on PPI on discharge. No further recommendations from cardiology, follow-up with PCP for further continued care. \  Hypertension: Blood pressure controlled-continue with lisinopril, metoprolol and amlodipine. Follow with PCP for further optimization.   Pulmonary nodule: Per patient's mother-this is a chronic issue and has been going on for years-however unclear if this has increased in size-have advised that patient follow with PCP for outpatient PET scan. Patient  is aware of the risk of malignancy. Patient has a long-standing history of tobacco use.   Hypertriglyceridemia: For now continue with vascepa-probably need statins to be added-defer to PCP. .  Tobacco abuse: Counseled importance of completely stopping-claims he has cut down only smokes "occasionally".  Procedures/Studies: LHC 6/4>> Mild to moderate nonobstructive CAD involving second diagonal branch and proximal RCA.  Discharge Diagnoses:  Active Problems:   Benign hypertension   Hyperlipidemia LDL goal <130   Chest pain Atypical chest pain   Discharge Instructions:  Activity:  As tolerated with Full fall precautions use walker/cane & assistance as needed   Discharge Instructions    Diet - low sodium heart healthy    Complete by:  As directed    Discharge instructions    Complete by:  As directed    You have a 1 cm right lung nodule-this could be cancer-please follow with your regular M.D./primary care M.D. for further workup-you probably will require a PET scan to be done.  Stop smoking completely  Follow with Primary MD  Benita Stabile, MD  In 1 week  Please get a complete blood count and chemistry panel checked by your Primary MD at your next visit, and again as instructed by your Primary MD.  Get Medicines reviewed and adjusted: Please take all your medications with you for your next visit with your Primary MD  Laboratory/radiological data: Please request your Primary MD to go over all hospital tests and procedure/radiological results at the follow up, please ask your Primary MD to get all Hospital records sent to his/her office.  In some cases, they will be blood work, cultures and biopsy results pending at the time of your discharge. Please request that your primary care M.D.  follows up on these results.  Also Note the following: If you experience worsening of your admission symptoms, develop shortness of breath, life threatening emergency, suicidal or homicidal  thoughts you must seek medical attention immediately by calling 911 or calling your MD immediately  if symptoms less severe.  You must read complete instructions/literature along with all the possible adverse reactions/side effects for all the Medicines you take and that have been prescribed to you. Take any new Medicines after you have completely understood and accpet all the possible adverse reactions/side effects.   Do not drive when taking Pain medications or sleeping medications (Benzodaizepines)  Do not take more than prescribed Pain, Sleep and Anxiety Medications. It is not advisable to combine anxiety,sleep and pain medications without talking with your primary care practitioner  Special Instructions: If you have smoked or chewed Tobacco  in the last 2 yrs please stop smoking, stop any regular Alcohol  and or any Recreational drug use.  Wear Seat belts while driving.  Please note: You were cared for by a hospitalist during your hospital stay. Once you are discharged, your primary care physician will handle any further medical issues. Please note that NO REFILLS for any discharge medications will be authorized once you are discharged, as it is imperative that you return to your primary care physician (or establish a relationship with a primary care physician if you do not have one) for your post hospital discharge needs so that they can reassess your need for medications and monitor your lab values.   Increase activity slowly    Complete by:  As directed      Allergies as of 12/10/2016   No Known Allergies     Medication List    TAKE these medications   amLODipine 5 MG tablet Commonly known as:  NORVASC Take 5 mg by mouth daily.   aspirin EC 81 MG tablet Take 1 tablet (81 mg total) by mouth daily.   cyanocobalamin 1000 MCG/ML injection Commonly known as:  (VITAMIN B-12) Inject 1 mL (1,000 mcg total) into the muscle once. What changed:  when to take this   gabapentin 800 MG  tablet Commonly known as:  NEURONTIN Take 1 tablet (800 mg total) by mouth 3 (three) times daily.   HYDROcodone-acetaminophen 5-325 MG tablet Commonly known as:  NORCO/VICODIN Take 1 tablet by mouth every 6 (six) hours as needed for moderate pain.   HYSINGLA ER 40 MG T24a Generic drug:  HYDROcodone Bitartrate ER Take 40 mg by mouth daily.   lisinopril 20 MG tablet Commonly known as:  PRINIVIL,ZESTRIL Take 20 mg by mouth daily.   metoprolol tartrate 25 MG tablet Commonly known as:  LOPRESSOR Take 1 tablet (25 mg total) by mouth 2 (two) times daily.   pantoprazole 40 MG tablet Commonly known as:  PROTONIX Take 1 tablet (40 mg total) by mouth daily.      Follow-up Information    Benita StabileHall, Yul Z, MD. Schedule an appointment as soon as possible for a visit in 1 week(s).   Specialty:  Internal Medicine Contact information: [address] [place] [address] [phone]          No Known Allergies  Consultations:   cardiology  Other Procedures/Studies: [place] Carotid Bilateral  Result Date: 12/04/2016 CLINICAL DATA:  Carotid artery disease follow-up. History of CAD, hypertension, visual disturbance, hyperlipidemia, diabetes and smoking. EXAM: BILATERAL CAROTID DUPLEX ULTRASOUND TECHNIQUE: Wallace CullensGray scale imaging, color Doppler and duplex ultrasound were performed of bilateral carotid and vertebral  arteries in the neck. COMPARISON:  Carotid Doppler ultrasound -01/27/2012 FINDINGS: Criteria: Quantification of carotid stenosis is based on velocity parameters that correlate the residual internal carotid diameter with NASCET-based stenosis levels, using the diameter of the distal internal carotid lumen as the denominator for stenosis measurement. The following velocity measurements were obtained: RIGHT ICA:  97/37 cm/sec CCA:  135/28 cm/sec SYSTOLIC ICA/CCA RATIO:  0.7 DIASTOLIC ICA/CCA RATIO:  1.4 ECA:  126 cm/sec LEFT ICA:  116/43 cm/sec CCA:  131/34 cm/sec SYSTOLIC ICA/CCA RATIO:   0.9 DIASTOLIC ICA/CCA RATIO:  1.4 ECA:  144 cm/sec RIGHT CAROTID ARTERY: There is a minimal amount of eccentric mixed echogenic plaque involving the origin and proximal aspects of the right internal carotid artery (image 24), progressed compared to the 01/2012 examination, though again not resulting in elevated peak systolic velocities within the interrogated course the right internal carotid artery to suggest a hemodynamically significant stenosis. RIGHT VERTEBRAL ARTERY:  Antegrade flow LEFT CAROTID ARTERY: There is a minimal amount of intimal thickening within the left carotid bulb (image 50), extending to involve the origin and proximal aspect the left internal carotid artery (image 58), minimally progressed compared to the 01/2012 examination though not resulting in elevated peak systolic velocities within the interrogated course of the left internal carotid artery to suggest a hemodynamically significant stenosis. LEFT VERTEBRAL ARTERY:  Antegrade flow IMPRESSION: Minimal amount of bilateral intimal thickening and atherosclerotic plaque, left subjectively greater than right, minimally progressed compared to the 01/2012 examination though not resulting in a hemodynamically significant stenosis within either internal carotid artery. Electronically Signed   By: Simonne Come M.D.   On: 12/04/2016 14:32   Nm Myocar Multi W/spect W/wall Motion / Ef  Result Date: 12/05/2016  There was no ST segment deviation noted during stress.  No T wave inversion was noted during stress.  Findings consistent with prior septal myocardial infarction with moderate peri-infarct ischemia.  This is an intermediate risk study.  The left ventricular ejection fraction is normal (55-65%).    Dg Chest Port 1 View  Result Date: 12/09/2016 CLINICAL DATA:  Chest pain since 1 hour ago. EXAM: PORTABLE CHEST 1 VIEW COMPARISON:  08/15/2016 CT and CXR FINDINGS: The heart size and mediastinal contours are within normal limits. Both lungs  are clear. The previously documented lingular nodular opacity overlying the left lung base is partially obscured by the posterior left eighth rib. The visualized skeletal structures are unremarkable. IMPRESSION: No active disease. Obscured lingular nodule due to overlapping left eighth rib. Electronically Signed   By: Tollie Eth M.D.   On: 12/09/2016 02:17      TODAY-DAY OF DISCHARGE:  Subjective:   Mathew Brady today has no headache,no chest abdominal pain,no new weakness tingling or numbness, feels much better wants to go home today.   Objective:   Blood pressure (!) 147/85, pulse 86, temperature 98.8 F (37.1 C), temperature source Oral, resp. rate 16, height 5\' 9"  (1.753 m), weight 84.5 kg (186 lb 4.8 oz), SpO2 100 %.  Intake/Output Summary (Last 24 hours) at 12/10/16 0955 Last data filed at 12/10/16 0500  Gross per 24 hour  Intake                0 ml  Output              700 ml  Net             -700 ml   Filed Weights   12/09/16 0149 12/09/16 0557 12/10/16 0431  Weight: 84.4 kg (186 lb) 87.7 kg (193 lb 4.8 oz) 84.5 kg (186 lb 4.8 oz)    Exam: Awake Alert, Oriented *3, No new F.N deficits, Normal affect [place].AT,PERRAL Supple Neck,No JVD, No cervical lymphadenopathy appriciated.  Symmetrical Chest wall movement, Good air movement bilaterally, CTAB RRR,No Gallops,Rubs or new Murmurs, No Parasternal Heave +ve B.Sounds, Abd Soft, Non tender, No organomegaly appriciated, No rebound -guarding or rigidity. No Cyanosis, Clubbing or edema, No new Rash or bruise   PERTINENT RADIOLOGIC STUDIES: US Carotid Bilateral  Result Date: 12/04/2016 CLINICAL DATA:  Carotid artery disease follow-up. History of CAD, hypertension, visual disturbance, hyperlipidemia, diabetes and smoking. EXAM: BILATERAL CAROTID DUPLEX ULTRASOUND TECHNIQUE: Wallace Cullens scale imaging, color Doppler and duplex ultrasound were performed of bilateral carotid and vertebral arteries in the neck. COMPARISON:  Carotid Doppler  ultrasound -01/27/2012 FINDINGS: Criteria: Quantification of carotid stenosis is based on velocity parameters that correlate the residual internal carotid diameter with NASCET-based stenosis levels, using the diameter of the distal internal carotid lumen as the denominator for stenosis measurement. The following velocity measurements were obtained: RIGHT ICA:  97/37 cm/sec CCA:  135/28 cm/sec SYSTOLIC ICA/CCA RATIO:  0.7 DIASTOLIC ICA/CCA RATIO:  1.4 ECA:  126 cm/sec LEFT ICA:  116/43 cm/sec CCA:  131/34 cm/sec SYSTOLIC ICA/CCA RATIO:  0.9 DIASTOLIC ICA/CCA RATIO:  1.4 ECA:  144 cm/sec RIGHT CAROTID ARTERY: There is a minimal amount of eccentric mixed echogenic plaque involving the origin and proximal aspects of the right internal carotid artery (image 24), progressed compared to the 01/2012 examination, though again not resulting in elevated peak systolic velocities within the interrogated course the right internal carotid artery to suggest a hemodynamically significant stenosis. RIGHT VERTEBRAL ARTERY:  Antegrade flow LEFT CAROTID ARTERY: There is a minimal amount of intimal thickening within the left carotid bulb (image 50), extending to involve the origin and proximal aspect the left internal carotid artery (image 58), minimally progressed compared to the 01/2012 examination though not resulting in elevated peak systolic velocities within the interrogated course of the left internal carotid artery to suggest a hemodynamically significant stenosis. LEFT VERTEBRAL ARTERY:  Antegrade flow IMPRESSION: Minimal amount of bilateral intimal thickening and atherosclerotic plaque, left subjectively greater than right, minimally progressed compared to the 01/2012 examination though not resulting in a hemodynamically significant stenosis within either internal carotid artery. Electronically Signed   By: Simonne Come M.D.   On: 12/04/2016 14:32   Nm Myocar Multi W/spect W/wall Motion / Ef  Result Date: 12/05/2016  There  was no ST segment deviation noted during stress.  No T wave inversion was noted during stress.  Findings consistent with prior septal myocardial infarction with moderate peri-infarct ischemia.  This is an intermediate risk study.  The left ventricular ejection fraction is normal (55-65%).    Dg Chest Port 1 View  Result Date: 12/09/2016 CLINICAL DATA:  Chest pain since 1 hour ago. EXAM: PORTABLE CHEST 1 VIEW COMPARISON:  08/15/2016 CT and CXR FINDINGS: The heart size and mediastinal contours are within normal limits. Both lungs are clear. The previously documented lingular nodular opacity overlying the left lung base is partially obscured by the posterior left eighth rib. The visualized skeletal structures are unremarkable. IMPRESSION: No active disease. Obscured lingular nodule due to overlapping left eighth rib. Electronically Signed   By: Tollie Eth M.D.   On: 12/09/2016 02:17     PERTINENT LAB RESULTS: CBC:  Recent Labs  12/09/16 0152 12/10/16 0409  WBC 9.6 14.5*  HGB 15.0 13.3  HCT 44.6 40.2  PLT 237 232   CMET CMP     Component Value Date/Time   NA 143 12/09/2016 0152   NA 139 12/21/2015 1404   K 3.6 12/09/2016 0152   CL 104 12/09/2016 0152   CO2 29 12/09/2016 0152   GLUCOSE 109 (H) 12/09/2016 0152   BUN 10 12/09/2016 0152   BUN 7 12/21/2015 1404   CREATININE 1.04 12/09/2016 0152   CALCIUM 9.3 12/09/2016 0152   PROT 6.6 08/25/2016 1839   PROT 7.1 12/21/2015 1404   ALBUMIN 4.2 08/25/2016 1839   ALBUMIN 4.5 12/21/2015 1404   AST 29 08/25/2016 1839   ALT 21 08/25/2016 1839   ALKPHOS 73 08/25/2016 1839   BILITOT 1.1 08/25/2016 1839   BILITOT 0.2 12/21/2015 1404   GFRNONAA >60 12/09/2016 0152   GFRAA >60 12/09/2016 0152    GFR Estimated Creatinine Clearance: 84 mL/min (by C-G formula based on SCr of 1.04 mg/dL). No results for input(s): LIPASE, AMYLASE in the last 72 hours.  Recent Labs  12/09/16 0602 12/09/16 1034 12/09/16 1807  TROPONINI <0.03 <0.03  <0.03   Invalid input(s): POCBNP No results for input(s): DDIMER in the last 72 hours. No results for input(s): HGBA1C in the last 72 hours. No results for input(s): CHOL, HDL, LDLCALC, TRIG, CHOLHDL, LDLDIRECT in the last 72 hours. No results for input(s): TSH, T4TOTAL, T3FREE, THYROIDAB in the last 72 hours.  Invalid input(s): FREET3 No results for input(s): VITAMINB12, FOLATE, FERRITIN, TIBC, IRON, RETICCTPCT in the last 72 hours. Coags:  Recent Labs  12/09/16 0152  INR 0.90   Microbiology: Recent Results (from the past 240 hour(s))  MRSA PCR Screening     Status: None   Collection Time: 12/09/16  6:15 AM  Result Value Ref Range Status   MRSA by PCR NEGATIVE NEGATIVE Final    Comment:        The GeneXpert MRSA Assay (FDA approved for NASAL specimens only), is one component of a comprehensive MRSA colonization surveillance program. It is not intended to diagnose MRSA infection nor to guide or monitor treatment for MRSA infections.     FURTHER DISCHARGE INSTRUCTIONS:  Get Medicines reviewed and adjusted: Please take all your medications with you for your next visit with your Primary MD  Laboratory/radiological data: Please request your Primary MD to go over all hospital tests and procedure/radiological results at the follow up, please ask your Primary MD to get all Hospital records sent to his/her office.  In some cases, they will be blood work, cultures and biopsy results pending at the time of your discharge. Please request that your primary care M.D. goes through all the records of your hospital data and follows up on these results.  Also Note the following: If you experience worsening of your admission symptoms, develop shortness of breath, life threatening emergency, suicidal or homicidal thoughts you must seek medical attention immediately by calling 911 or calling your MD immediately  if symptoms less severe.  You must read complete instructions/literature  along with all the possible adverse reactions/side effects for all the Medicines you take and that have been prescribed to you. Take any new Medicines after you have completely understood and accpet all the possible adverse reactions/side effects.   Do not drive when taking Pain medications or sleeping medications (Benzodaizepines)  Do not take more than prescribed Pain, Sleep and Anxiety Medications. It is not advisable to combine anxiety,sleep and pain medications without talking with your primary care practitioner  Special Instructions:  If you have smoked or chewed Tobacco  in the last 2 yrs please stop smoking, stop any regular Alcohol  and or any Recreational drug use.  Wear Seat belts while driving.  Please note: You were cared for by a hospitalist during your hospital stay. Once you are discharged, your primary care physician will handle any further medical issues. Please note that NO REFILLS for any discharge medications will be authorized once you are discharged, as it is imperative that you return to your primary care physician (or establish a relationship with a primary care physician if you do not have one) for your post hospital discharge needs so that they can reassess your need for medications and monitor your lab values.  Total Time spent coordinating discharge including counseling, education and face to face time equals 25 minutes.  SignedJeoffrey Massed 12/10/2016 9:55 AM

## 2016-12-10 NOTE — Telephone Encounter (Signed)
First available new patient appointment with any physician at this time is 07/10 with MW.

## 2016-12-11 ENCOUNTER — Ambulatory Visit: Payer: Self-pay | Admitting: Cardiology

## 2016-12-12 ENCOUNTER — Ambulatory Visit: Payer: Self-pay | Admitting: Cardiovascular Disease

## 2016-12-12 NOTE — Progress Notes (Signed)
Subjective:    Patient ID: Mathew Brady, male    DOB: 05/25/1966, 51 y.o.   MRN: [id number]  HPI Patient was found to have a nodule in his Lingula in February after a fall on routine imaging. He reports he has had pain in his anterior chest that radiates through to his back. He reports the pain is "deep" and intermittent. He reports some pleuritic component briefly with deep breathing. He reports no pain with movement. Denies any pain with eating. No dysphagia. He was empirically started on Protonix during his recent admission. His wife reports he does eat late at night. He denies any morning brash water taste or reflux. Previously the patient was having night sweats which have improved somewhat. Patient has only had low-grade fevers:  99.47F. He has lost 30 pounds over the last 5 months without trying. Denies any wheezing. He reports an intermittent cough when he "gets hot". Reports his cough produces a small amount of green mucus. Denies any hemoptysis. Denies any history of bronchitis. Has had sporadic pneumonia in the past. No history of asthma or breathing problems as a child. Denies any dyspnea except with exertion. He does fatigue at times. He has had intermittent, rare nausea. No abdominal pain. No diarrhea, melena, or hematochezia. Recently had a colonoscopy. Reportedly he was found to have 1 polyp. No dysuria or hematuria.   Review of Systems Reports rare, occipital headaches. No focal vision loss, weakness, numbness, or tingling. He reports his chronic fibromyalgia pain but does report a radiating pain from his right gluteal area down his leg. No adenopathy in his neck, groin, or axilla. A pertinent 14 point review of systems is negative except as per the history of presenting illness.  No Known Allergies  Current Outpatient Prescriptions on File Prior to Visit  Medication Sig Dispense Refill  . amLODipine (NORVASC) 5 MG tablet Take 5 mg by mouth daily.  4  . aspirin EC 81 MG tablet Take 1  tablet (81 mg total) by mouth daily. 90 tablet 3  . cyanocobalamin (,VITAMIN B-12,) 1000 MCG/ML injection Inject 1 mL (1,000 mcg total) into the muscle once. (Patient taking differently: Inject 1,000 mcg into the muscle every 30 (thirty) days. ) 25 mL 0  . gabapentin (NEURONTIN) 800 MG tablet Take 1 tablet (800 mg total) by mouth 3 (three) times daily. 270 tablet 1  . HYDROcodone-acetaminophen (NORCO/VICODIN) 5-325 MG tablet Take 1 tablet by mouth every 6 (six) hours as needed for moderate pain.   0  . HYSINGLA ER 40 MG T24A Take 40 mg by mouth daily.   0  . lisinopril (PRINIVIL,ZESTRIL) 20 MG tablet Take 20 mg by mouth daily.  4  . metoprolol tartrate (LOPRESSOR) 25 MG tablet Take 1 tablet (25 mg total) by mouth 2 (two) times daily. 60 tablet 1  . pantoprazole (PROTONIX) 40 MG tablet Take 1 tablet (40 mg total) by mouth daily. 30 tablet 0   No current facility-administered medications on file prior to visit.     Past Medical History:  Diagnosis Date  . Acid reflux   . Anemia 06/18/2012  . Anxiety   . Arthritis   . Chest pain 06/17/2012   MI ruled out  . Chronic abdominal pain   . Chronic prescription benzodiazepine use   . Fibromyalgia   . Hypertension   . Hypertriglyceridemia   . Opiate use   . Palpitations   . Peripheral neuropathy   . Prediabetes 06/18/2012   resolved with weight  loss  . Pulmonary nodule   . Rash 06/18/2012   Query eczema    Past Surgical History:  Procedure Laterality Date  . COLONOSCOPY     found to have 1 polyp  . ESOPHAGEAL DILATION  1993  . LEFT HEART CATH AND CORONARY ANGIOGRAPHY N/A 12/09/2016   Procedure: Left Heart Cath and Coronary Angiography;  Surgeon: Yvonne Kendall, MD;  Location: [facility];  Service: Cardiovascular;  Laterality: N/A;  . TONSILLECTOMY      Family History  Problem Relation Age of Onset  . Fibromyalgia Mother   . Bladder Cancer Father 17  . Benign prostatic hyperplasia Father   . Healthy Son   . Heart  failure Paternal Grandmother   . Heart failure Paternal Grandfather   . Lung cancer Paternal Grandfather   . Lung cancer Maternal Uncle   . Emphysema Maternal Uncle   . Lung cancer Paternal Uncle   . Lung cancer Maternal Grandmother   . Stomach cancer Paternal Uncle     Social History   Social History  . Marital status: Married    Spouse name: N/A  . Number of children: N/A  . 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 Smoking status: Former Smoker    Packs/day: 1.50    Years: 35.00    Types: Cigarettes    Start date: 03/12/1979    Quit date: 03/09/2015  . Smokeless tobacco: Never Used     Comment: Smoked off & on - Peak rate 1.5  . Alcohol use No  . Drug use: No  . Sexual activity: Yes    Partners: Female   Other Topics Concern  . None   Social History Narrative   Lives with wife in a 2 story home.  Has 5 children all together.  Not working at this time.  Trying to get disability.  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   Education: Some high school.      [facility] (12/13/16):   Originally from [facility]. 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 Currently has a Nurse, adult. Also has horses, a donkey, dogs, and a cat. No mold exposure. No hot tub exposure.       Objective:   Physical Exam BP 120/80 (BP Location: Left Arm, Patient Position: Sitting, Cuff Size: Normal)   Pulse 61   Ht 5\' 9"  (1.753 m)   Wt 189 lb 12.8 oz (86.1 kg)   SpO2 98%   BMI 28.03 kg/m  General:  Awake. Alert. No acute distress. Accompanied by wife today. Integument:  Warm & dry. No rash on exposed skin. No bruising on exposed skin. Extremities:  No cyanosis or clubbing.  Lymphatics:  No appreciated cervical or supraclavicular lymphadenoapthy. HEENT:  Moist mucus membranes. No oral ulcers. No scleral injection or icterus.  Cardiovascular:   Regular rate. No edema. No appreciable JVD.  Pulmonary:  Good aeration & clear to auscultation bilaterally. Symmetric chest wall expansion. No accessory muscle use on room air. Abdomen: Soft. Normal bowel sounds. Nondistended. Minimal epigastric discomfort to deep palpation. Musculoskeletal:  Normal bulk and tone. Hand grip strength 5/5 bilaterally. No joint deformity or effusion appreciated. Neurological:  CN 2-12 grossly in tact. No meningismus. Moving all 4 extremities equally. Symmetric brachioradialis deep tendon reflexes. Psychiatric:  Mood and affect congruent. Speech normal rhythm, rate & tone.  IMAGING CTA CHEST/ABDOMEN/PELVIS 08/25/16 (personally reviewed by me): No pleural effusion or thickening. No pericardial effusion. No pathologic mediastinal adenopathy. 1 cm rounded nodule within the inferior lingula. No prior imaging for comparison. No pulmonary embolism. No aortic dissection. No other evidence of acute intra-abdominal pathology.    Assessment & Plan:  51 y.o. male with lingula nodule on CT imaging in February and known History of tobacco use disorder.Patient's atypical chest pain is likely secondary to reflux given his excessive and chronic late night eating as well as carbonated caffeine beverage consumption [facility]). Given his long-standing history of tobacco use occult COPD is certainly possible however I do not see any emphysema on CT imaging. We did review his CT scan today and given his personal history of tobacco use as well as family history of malignancy and exposure to asbestos I feel that repeat CT imaging is the next most prudent step before PET/CT imaging. We did briefly discuss the possibility of resection depending upon spread of malignancies but I did caution the patient that this is very early in the investigational workup process. Certainly with his long-standing history of dirt track racing and bird exposure there are other possible etiologies to this nodule. I  instructed the patient to contact my office if he had any new breathing problems or questions before his next appointment.  1. Lingula nodule: Checking CT chest without contrast. Consider PET/CT imaging depending upon the result. 2. Atypical chest pain: Continuing empiric treatment for reflux with Protonix. Patient counseled on appropriate dietary and lifestyle modifications. 3. Asbestos exposure: Checking full pulmonary function testing before next appointment. 4. Follow-up: Return to clinic in 4 weeks or sooner if needed.  Donna Christen Jamison Neighbor, M.D. [facility] Pulmonary & Critical Care Pager:  [phone] After 3pm or if no response, call [phone] 9:25 AM 12/13/16

## 2016-12-13 ENCOUNTER — Encounter: Payer: Self-pay | Admitting: Pulmonary Disease

## 2016-12-13 ENCOUNTER — Ambulatory Visit (INDEPENDENT_AMBULATORY_CARE_PROVIDER_SITE_OTHER): Payer: BLUE CROSS/BLUE SHIELD | Admitting: Pulmonary Disease

## 2016-12-13 VITALS — BP 120/80 | HR 61 | Ht 69.0 in | Wt 189.8 lb

## 2016-12-13 DIAGNOSIS — R911 Solitary pulmonary nodule: Secondary | ICD-10-CM

## 2016-12-13 DIAGNOSIS — R0789 Other chest pain: Secondary | ICD-10-CM | POA: Diagnosis not present

## 2016-12-13 DIAGNOSIS — IMO0001 Reserved for inherently not codable concepts without codable children: Secondary | ICD-10-CM

## 2016-12-13 DIAGNOSIS — Z77090 Contact with and (suspected) exposure to asbestos: Secondary | ICD-10-CM

## 2016-12-13 NOTE — Patient Instructions (Signed)
   Call me if you have any questions or concerns.  Remember to cut back on your caffeine and late night eating to help with any reflux you may be having. Ideally we want you to not have anything in your stomach within 3 hours of bedtime.  TESTS ORDERED: 1. Full PFTs before next appointment 2. CT Chest w/o ASAP

## 2016-12-20 DIAGNOSIS — I2510 Atherosclerotic heart disease of native coronary artery without angina pectoris: Secondary | ICD-10-CM | POA: Diagnosis not present

## 2016-12-20 DIAGNOSIS — I10 Essential (primary) hypertension: Secondary | ICD-10-CM | POA: Diagnosis not present

## 2016-12-20 DIAGNOSIS — R0602 Shortness of breath: Secondary | ICD-10-CM | POA: Diagnosis not present

## 2016-12-20 DIAGNOSIS — R911 Solitary pulmonary nodule: Secondary | ICD-10-CM | POA: Diagnosis not present

## 2016-12-25 ENCOUNTER — Ambulatory Visit (HOSPITAL_COMMUNITY)
Admission: RE | Admit: 2016-12-25 | Discharge: 2016-12-25 | Disposition: A | Discharge status: Home / Self Care | Payer: BLUE CROSS/BLUE SHIELD | Source: Ambulatory Visit | Attending: Pulmonary Disease | Admitting: Pulmonary Disease

## 2016-12-25 DIAGNOSIS — I700 Atherosclerosis of aorta: Secondary | ICD-10-CM | POA: Insufficient documentation

## 2016-12-25 DIAGNOSIS — R918 Other nonspecific abnormal finding of lung field: Secondary | ICD-10-CM | POA: Diagnosis not present

## 2016-12-25 DIAGNOSIS — R911 Solitary pulmonary nodule: Secondary | ICD-10-CM

## 2016-12-25 DIAGNOSIS — IMO0001 Reserved for inherently not codable concepts without codable children: Secondary | ICD-10-CM

## 2016-12-27 NOTE — Progress Notes (Signed)
Spoke with patient and informed him of results. Pt verbalized understanding and did not have any questions. Nothing further is needed.

## 2017-01-07 ENCOUNTER — Ambulatory Visit: Payer: Self-pay | Admitting: Cardiology

## 2017-01-10 ENCOUNTER — Ambulatory Visit (INDEPENDENT_AMBULATORY_CARE_PROVIDER_SITE_OTHER): Payer: BLUE CROSS/BLUE SHIELD | Admitting: Pulmonary Disease

## 2017-01-10 DIAGNOSIS — IMO0001 Reserved for inherently not codable concepts without codable children: Secondary | ICD-10-CM

## 2017-01-10 DIAGNOSIS — R911 Solitary pulmonary nodule: Secondary | ICD-10-CM | POA: Diagnosis not present

## 2017-01-10 DIAGNOSIS — Z77090 Contact with and (suspected) exposure to asbestos: Secondary | ICD-10-CM

## 2017-01-10 LAB — PULMONARY FUNCTION TEST
FEF 25-75 PRE: 0.51 L/s
FEF 25-75 Post: 4.94 L/sec
FEF2575-%Change-Post: 868 %
FEF2575-%PRED-POST: 150 %
FEF2575-%Pred-Pre: 15 %
FEV1-%CHANGE-POST: 106 %
FEV1-%PRED-POST: 99 %
FEV1-%Pred-Pre: 48 %
FEV1-POST: 3.73 L
FEV1-Pre: 1.81 L
FEV1FVC-%Change-Post: 78 %
FEV1FVC-%Pred-Pre: 59 %
FEV6-%CHANGE-POST: 22 %
FEV6-%PRED-POST: 97 %
FEV6-%PRED-PRE: 79 %
FEV6-POST: 4.52 L
FEV6-PRE: 3.68 L
FEV6FVC-%CHANGE-POST: 3 %
FEV6FVC-%PRED-PRE: 100 %
FEV6FVC-%Pred-Post: 104 %
FVC-%CHANGE-POST: 15 %
FVC-%PRED-POST: 94 %
FVC-%Pred-Pre: 81 %
FVC-Post: 4.57 L
FVC-Pre: 3.96 L
POST FEV6/FVC RATIO: 100 %
PRE FEV6/FVC RATIO: 96 %
Post FEV1/FVC ratio: 81 %
Pre FEV1/FVC ratio: 46 %
RV % PRED: 9 %
RV: 0.2 L
TLC % pred: 69 %
TLC: 4.72 L

## 2017-01-10 NOTE — Progress Notes (Signed)
PFT done today. 

## 2017-01-21 DIAGNOSIS — G8929 Other chronic pain: Secondary | ICD-10-CM | POA: Diagnosis not present

## 2017-01-21 DIAGNOSIS — I10 Essential (primary) hypertension: Secondary | ICD-10-CM | POA: Diagnosis not present

## 2017-01-21 DIAGNOSIS — R911 Solitary pulmonary nodule: Secondary | ICD-10-CM | POA: Diagnosis not present

## 2017-01-21 DIAGNOSIS — R0602 Shortness of breath: Secondary | ICD-10-CM | POA: Diagnosis not present

## 2017-01-22 ENCOUNTER — Encounter: Payer: Self-pay | Admitting: Pulmonary Disease

## 2017-01-22 ENCOUNTER — Ambulatory Visit (INDEPENDENT_AMBULATORY_CARE_PROVIDER_SITE_OTHER): Payer: BLUE CROSS/BLUE SHIELD | Admitting: Pulmonary Disease

## 2017-01-22 ENCOUNTER — Other Ambulatory Visit: Payer: BLUE CROSS/BLUE SHIELD

## 2017-01-22 VITALS — BP 140/82 | HR 81 | Ht 69.0 in | Wt 195.6 lb

## 2017-01-22 DIAGNOSIS — R918 Other nonspecific abnormal finding of lung field: Secondary | ICD-10-CM

## 2017-01-22 DIAGNOSIS — Z23 Encounter for immunization: Secondary | ICD-10-CM

## 2017-01-22 DIAGNOSIS — J449 Chronic obstructive pulmonary disease, unspecified: Secondary | ICD-10-CM

## 2017-01-22 MED ORDER — UMECLIDINIUM-VILANTEROL 62.5-25 MCG/INH IN AEPB: 1.0000 | INHALATION_SPRAY | Freq: Every day | RESPIRATORY_TRACT | 0 refills | Status: DC

## 2017-01-22 MED ORDER — UMECLIDINIUM-VILANTEROL 62.5-25 MCG/INH IN AEPB: 1.0000 | INHALATION_SPRAY | Freq: Every day | RESPIRATORY_TRACT | 3 refills | Status: DC

## 2017-01-22 NOTE — Addendum Note (Signed)
Addended by: Pamalee LeydenWIGGINS, Shivon Hackel J on: 01/22/2017 03:05 PM   Modules accepted: Orders

## 2017-01-22 NOTE — Progress Notes (Signed)
Patient seen in the office today and instructed on use of Anoro.  Patient expressed understanding and demonstrated technique. 

## 2017-01-22 NOTE — Addendum Note (Signed)
Addended by: Pamalee LeydenWIGGINS, Eswin Worrell J on: 01/22/2017 04:30 PM   Modules accepted: Orders

## 2017-01-22 NOTE — Progress Notes (Signed)
Subjective:    Patient ID: Mathew Brady, male    DOB: 02/19/1966, 51 y.o.   MRN: [id number]  C.C.:  Follow-up for Lingula Nodule, Atypical Chest Pain, & H/O Asbestos Exposure.   HPI Lingula nodule: Rounded, 1 cm nodule first seen in the inferior segment of the lingula February 2018. No signs of progression on June CT imaging. Other lung nodules also found.   Atypical chest pain: Possibly secondary to reflux. Protonix continued at last appointment. He reports his chest pain has resolved. No chest tightness.   H/O asbestos exposure:  No evidence of pleural or parenchymal disease on CT imaging.   Review of Systems He does continue to have intermittent dyspnea. Intermittent cough producing a "white, black, or green" mucus. He does notice more breathing problems with extreme heat. He has noticed it more with exposure to dust and he has been cutting hay lately. He does wake up with breathing problems at night. He is having pain & discomfort in his shoulder blades and rib cage. No fever or chills. Still having night sweats.   No Known Allergies  Current Outpatient Prescriptions on File Prior to Visit  Medication Sig Dispense Refill  . amLODipine (NORVASC) 5 MG tablet Take 5 mg by mouth daily.  4  . aspirin EC 81 MG tablet Take 1 tablet (81 mg total) by mouth daily. 90 tablet 3  . cyanocobalamin (,VITAMIN B-12,) 1000 MCG/ML injection Inject 1 mL (1,000 mcg total) into the muscle once. (Patient taking differently: Inject 1,000 mcg into the muscle every 30 (thirty) days. ) 25 mL 0  . gabapentin (NEURONTIN) 800 MG tablet Take 1 tablet (800 mg total) by mouth 3 (three) times daily. 270 tablet 1  . HYDROcodone-acetaminophen (NORCO/VICODIN) 5-325 MG tablet Take 1 tablet by mouth every 6 (six) hours as needed for moderate pain.   0  . HYSINGLA ER 40 MG T24A Take 40 mg by mouth daily.   0  . lisinopril (PRINIVIL,ZESTRIL) 20 MG tablet Take 20 mg by mouth daily.  4  . metoprolol tartrate (LOPRESSOR) 25 MG  tablet Take 1 tablet (25 mg total) by mouth 2 (two) times daily. 60 tablet 1  . pantoprazole (PROTONIX) 40 MG tablet Take 1 tablet (40 mg total) by mouth daily. 30 tablet 0   No current facility-administered medications on file prior to visit.     Past Medical History:  Diagnosis Date  . Acid reflux   . Anemia 06/18/2012  . Anxiety   . Arthritis   . Chest pain 06/17/2012   MI ruled out  . Chronic abdominal pain   . Chronic prescription benzodiazepine use   . Fibromyalgia   . Hypertension   . Hypertriglyceridemia   . Opiate use   . Palpitations   . Peripheral neuropathy   . Prediabetes 06/18/2012   resolved with weight loss  . Pulmonary nodule   . Rash 06/18/2012   Query eczema    Past Surgical History:  Procedure Laterality Date  . COLONOSCOPY     found to have 1 polyp  . ESOPHAGEAL DILATION  1993  . LEFT HEART CATH AND CORONARY ANGIOGRAPHY N/A 12/09/2016   Procedure: Left Heart Cath and Coronary Angiography;  Surgeon: Yvonne KendallEnd, Christopher, MD;  Location: [facility];  Service: Cardiovascular;  Laterality: N/A;  . TONSILLECTOMY      Family History  Problem Relation Age of Onset  . Fibromyalgia Mother   . Bladder Cancer Father [id number]  . Benign prostatic hyperplasia Father   .  Healthy Son   . Heart failure Paternal Grandmother   . Heart failure Paternal Grandfather   . Lung cancer Paternal Grandfather   . Lung cancer Maternal Uncle   . Emphysema Maternal Uncle   . Lung cancer Paternal Uncle   . Lung cancer Maternal Grandmother   . Stomach cancer Paternal Uncle     Social History   Social History  . Marital status: Married    Spouse name: N/A  . Number of children: N/A  . 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 Smoking status: Former Smoker    Packs/day: 1.50    Years: 35.00    Types: Cigarettes    Start date: 03/12/1979    Quit date: 03/09/2015  . Smokeless tobacco: Never Used     Comment:  Smoked off & on - Peak rate 1.5  . Alcohol use No  . Drug use: No  . Sexual activity: Yes    Partners: Female   Other Topics Concern  . None   Social History Narrative   Lives with wife in a 2 story home.  Has 5 children all together.  Not working at this time.  Trying to get disability.  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   Education: Some high school.      [facility] (12/13/16):   Originally from [facility]. 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 Currently has a Nurse, adult. Also has horses, a donkey, dogs, and a cat. No mold exposure. No hot tub exposure.       Objective:   Physical Exam BP 140/82 (BP Location: Left Arm, Patient Position: Sitting, Cuff Size: Normal)   Pulse 81   Ht 5\' 9"  (1.753 m)   Wt 195 lb 9.6 oz (88.7 kg)   SpO2 96%   BMI 28.89 kg/m   General:  Awake. Alert. No acute distress. Caucasian male. Accompanied by mother today. Integument:  Warm & dry. No rash on exposed skin.  Extremities:  No cyanosis or clubbing.  HEENT:  Moist mucus membranes. Minimal nasal turbinate swelling. No oral ulcers. Minimal erythema in right external auditory canal. Cardiovascular:  Regular rate. No edema. Unable to appreciate JVD.  Pulmonary:  Symmetrically decreased breath sounds. Otherwise clear to auscultation. No accessory muscle use on room air. Abdomen: Soft. Normal bowel sounds. Mildly protuberant. Musculoskeletal:  Normal bulk and tone. No joint deformity or effusion appreciated.  PFT 01/16/17:  FVC 3.96 L (81%) FEV1 1.81 L (48%) FEV1/FVC 0.46 FEF 25-75 0.51 L (15%) TLC 4.96 L (73%) RV 135% ERV 216% (patient unable to perform spirometry post bronchodilator challenge & DLCO)  IMAGING CT CHEST W/O 12/25/16 (personally reviewed by me):  New bilateral pulmonary nodules noted measuring up to 4 mm. 1 cm lingula nodule without appreciable change. No pleural effusion or thickening  appreciated. No pericardial effusion. No pathologic mediastinal adenopathy.  CTA CHEST/ABDOMEN/PELVIS 08/25/16 (previously reviewed by me): No pleural effusion or thickening. No pericardial effusion. No pathologic mediastinal adenopathy. 1 cm rounded nodule within the inferior lingula. No prior imaging for comparison. No pulmonary embolism. No aortic dissection. No other evidence of acute intra-abdominal pathology.    Assessment & Plan:  51 y.o. male with multiple lung nodules on CT imaging by my review today. He does have a dominant 1 cm nodule within his lingula which does not appear to be  enlarging but given his family history of malignancy and personal history of tobacco use the patient wishes to proceed with PET/CT imaging which I feel is reasonable. We reviewed his pulmonary function testing which show mild restriction but no evidence of interstitial lung disease on imaging. He does have severe airway obstruction and I feel would benefit from long-acting bronchodilator agents. I instructed the patient to contact my office if he had any new breathing problems or questions before his next appointment.   1. Severe COPD: Screening for alpha-1 antitrypsin deficiency today. Starting patient on Anoro Ellipta. Checking 6 minute walk test on room air on or before next appointment.  2. Multiple lung nodules:  Checking PET CT scan given dominant 1 cm nodule. Further imaging pending this result. 3. Mild restrictive lung disease: Likely secondary to mild central obesity. No suggestion of interstitial lung disease on chest CT. 4. Atypical chest pain: Resolved. 5. History of asbestos exposure: No evidence of pleural or parenchymal disease on CT imaging. 6. Health maintenance: Status post Tdap January 2017. Administering Pneumovax 23 today. 7. Follow-up: Return to clinic in 3 months or sooner if needed.  Donna Christen Jamison Neighbor, M.D. [facility] Pulmonary & Critical Care Pager:  [phone] After 3pm or if no  response, call [phone] 2:24 PM 01/22/17

## 2017-01-22 NOTE — Patient Instructions (Addendum)
   Call me if you have any new breathing problems or questions before your next appointment.  We will contact you with the result of your PET CT.  Remember to stop using the Anoro inhaler we are giving you if you have any trouble peeing, trouble with blurry vision or any other adverse reaction to the medication.  Use the Anoro Sample by doing 1 inhalation once daily - at the same time every day. We have sent a prescription to your pharmacy but try the sample first.   TESTS ORDERED: 1. on room air on or before next appointment 2. Serum Alpha-1 Antitrypsin today 3. PET CT Scan

## 2017-01-23 ENCOUNTER — Institutional Professional Consult (permissible substitution): Payer: Self-pay | Admitting: Pulmonary Disease

## 2017-01-29 LAB — ALPHA-1 ANTITRYPSIN PHENOTYPE

## 2017-01-30 ENCOUNTER — Other Ambulatory Visit: Payer: Self-pay | Admitting: Pulmonary Disease

## 2017-01-30 DIAGNOSIS — J449 Chronic obstructive pulmonary disease, unspecified: Secondary | ICD-10-CM

## 2017-01-30 NOTE — Progress Notes (Signed)
Spoke with patient and informed him of information. He verbalized understanding and stated he will come Monday to have lab drawn. Order placed for Alpha 1. Nothing further is needed.

## 2017-02-01 ENCOUNTER — Other Ambulatory Visit: Payer: Self-pay | Admitting: Family Medicine

## 2017-02-01 DIAGNOSIS — M797 Fibromyalgia: Secondary | ICD-10-CM

## 2017-02-03 ENCOUNTER — Ambulatory Visit (HOSPITAL_COMMUNITY)
Admission: RE | Admit: 2017-02-03 | Discharge: 2017-02-03 | Disposition: A | Discharge status: Home / Self Care | Payer: BLUE CROSS/BLUE SHIELD | Source: Ambulatory Visit | Attending: Pulmonary Disease | Admitting: Pulmonary Disease

## 2017-02-03 ENCOUNTER — Other Ambulatory Visit: Payer: Self-pay

## 2017-02-03 DIAGNOSIS — R918 Other nonspecific abnormal finding of lung field: Secondary | ICD-10-CM | POA: Diagnosis not present

## 2017-02-03 DIAGNOSIS — J449 Chronic obstructive pulmonary disease, unspecified: Secondary | ICD-10-CM

## 2017-02-03 LAB — GLUCOSE, CAPILLARY: GLUCOSE-CAPILLARY: 94 mg/dL (ref 65–99)

## 2017-02-03 MED ORDER — FLUDEOXYGLUCOSE F - 18 (FDG) INJECTION
9.0000 | Freq: Once | INTRAVENOUS | Status: AC | PRN
  Administered 2017-02-03: 9 via INTRAVENOUS

## 2017-02-04 NOTE — Telephone Encounter (Signed)
Last seen 06/26/17  Dr Dettinger

## 2017-02-05 NOTE — Telephone Encounter (Signed)
Will go ahead and do refill, patient needs appointment

## 2017-02-07 LAB — ALPHA-1 ANTITRYPSIN PHENOTYPE: A-1 Antitrypsin: 153 mg/dL (ref 83–199)

## 2017-02-20 ENCOUNTER — Telehealth: Payer: Self-pay | Admitting: Pulmonary Disease

## 2017-02-20 NOTE — Telephone Encounter (Signed)
Spoke with pt's wife, Marylene Landngela. At his last OV, JN gave him Anoro. Marylene Landngela states that the pt feels like this is not working. He is still reporting having issues with SOB.  JN - please advise. Thanks.

## 2017-02-20 NOTE — Telephone Encounter (Signed)
Let's get him a sample of Stiolto Respimat and instruct him to do 2 inhalations once daily and let us know if it helps. Thanks.

## 2017-02-20 NOTE — Telephone Encounter (Signed)
lmtcb x1 for pt's wife, Mathew Brady.

## 2017-02-21 MED ORDER — TIOTROPIUM BROMIDE-OLODATEROL 2.5-2.5 MCG/ACT IN AERS: 2.0000 | INHALATION_SPRAY | Freq: Every day | RESPIRATORY_TRACT | 0 refills | Status: AC

## 2017-02-21 NOTE — Telephone Encounter (Signed)
Pt's wife is aware of JN's recommendations and voiced her understanding. Sample has been placed up front for pick up. Nothing further needed.

## 2017-02-21 NOTE — Telephone Encounter (Signed)
lmtcb x1 for pt's spouse, Marylene Land.

## 2017-03-06 ENCOUNTER — Ambulatory Visit: Payer: Self-pay | Admitting: Cardiology

## 2017-03-25 ENCOUNTER — Ambulatory Visit (INDEPENDENT_AMBULATORY_CARE_PROVIDER_SITE_OTHER): Payer: BLUE CROSS/BLUE SHIELD | Admitting: Pulmonary Disease

## 2017-03-25 ENCOUNTER — Ambulatory Visit (INDEPENDENT_AMBULATORY_CARE_PROVIDER_SITE_OTHER)
Admission: RE | Admit: 2017-03-25 | Discharge: 2017-03-25 | Disposition: A | Discharge status: Home / Self Care | Payer: BLUE CROSS/BLUE SHIELD | Source: Ambulatory Visit | Attending: Pulmonary Disease | Admitting: Pulmonary Disease

## 2017-03-25 VITALS — BP 138/78 | HR 71 | Temp 98.6°F | Ht 69.0 in | Wt 192.5 lb

## 2017-03-25 DIAGNOSIS — J449 Chronic obstructive pulmonary disease, unspecified: Secondary | ICD-10-CM

## 2017-03-25 DIAGNOSIS — R05 Cough: Secondary | ICD-10-CM

## 2017-03-25 DIAGNOSIS — R059 Cough, unspecified: Secondary | ICD-10-CM

## 2017-03-25 MED ORDER — PREDNISONE 20 MG PO TABS: 40.0000 mg | ORAL_TABLET | Freq: Every day | ORAL | 0 refills | Status: DC

## 2017-03-25 MED ORDER — DOXYCYCLINE HYCLATE 100 MG PO TABS: 100.0000 mg | ORAL_TABLET | Freq: Two times a day (BID) | ORAL | 0 refills | Status: DC

## 2017-03-25 MED ORDER — ALBUTEROL SULFATE HFA 108 (90 BASE) MCG/ACT IN AERS: 2.0000 | INHALATION_SPRAY | RESPIRATORY_TRACT | 6 refills | Status: AC | PRN

## 2017-03-25 MED ORDER — BENZONATATE 100 MG PO CAPS: 100.0000 mg | ORAL_CAPSULE | Freq: Four times a day (QID) | ORAL | 1 refills | Status: DC | PRN

## 2017-03-25 MED ORDER — ALBUTEROL SULFATE HFA 108 (90 BASE) MCG/ACT IN AERS: 2.0000 | INHALATION_SPRAY | RESPIRATORY_TRACT | 6 refills | Status: DC | PRN

## 2017-03-25 NOTE — Progress Notes (Signed)
Subjective:    Patient ID: Mathew Brady, male    DOB: [birth year], 51 y.o.   MRN: [id number]  C.C.:  Acute visit for Cough with known Severe COPD, Multiple Lung Nodules, H/O Asbestos Exposure, & Mild Restrictive Lung Disease.   HPI Cough:  He started feeling congested over the weekend. He reports since Sunday night he has had a cough producing a "dark, green" mucus. He is producing increasing volumes of mucus over the size of a 50 cent piece. No recent since contacts.   Severe COPD: Started on Anoro at last appointment. He has been wheezing more frequently. Adherent with his Anoro. He has had increased dyspnea of late as well.   Multiple lung nodules: 1 cm rounded nodule seen on imaging first in February 2018 in lingula. Patient underwent PET/CT imaging in July showing no PET avidity.  History of asbestos exposure: No evidence of pleural or portable disease on imaging.  Restrictive lung disease: Mild by lung volumes. Likely due to central obesity. No evidence of interstitial lung disease on chest CT imaging.  Review of Systems He reports a "low grade" fever. No chills. Has had sweats. He reports some chest pain and discomfort in his back and ribs. He will wake up with discomfort and wife reports he does cough in his sleep. No abdominal pain, nausea, emesis or diarrhea. Previously did have sinus congestion & drainage that seems to have improved.   No Known Allergies  Current Outpatient Prescriptions on File Prior to Visit  Medication Sig Dispense Refill  . amLODipine (NORVASC) 5 MG tablet Take 5 mg by mouth daily.  4  . aspirin EC 81 MG tablet Take 1 tablet (81 mg total) by mouth daily. 90 tablet 3  . cyanocobalamin (,VITAMIN B-12,) 1000 MCG/ML injection Inject 1 mL (1,000 mcg total) into the muscle once. (Patient taking differently: Inject 1,000 mcg into the muscle every 30 (thirty) days. ) 25 mL 0  . gabapentin (NEURONTIN) 800 MG tablet TAKE 1 TABLET BY MOUTH THREE TIMES A DAY 270 tablet 0    . HYDROcodone-acetaminophen (NORCO/VICODIN) 5-325 MG tablet Take 1 tablet by mouth every 6 (six) hours as needed for moderate pain.   0  . HYSINGLA ER 40 MG T24A Take 40 mg by mouth daily.   0  . lisinopril (PRINIVIL,ZESTRIL) 20 MG tablet Take 20 mg by mouth daily.  4  . pantoprazole (PROTONIX) 40 MG tablet Take 1 tablet (40 mg total) by mouth daily. 30 tablet 0  . umeclidinium-vilanterol (ANORO ELLIPTA) 62.5-25 MCG/INH AEPB Inhale 1 puff into the lungs daily. 1 each 0  . umeclidinium-vilanterol (ANORO ELLIPTA) 62.5-25 MCG/INH AEPB Inhale 1 puff into the lungs daily. 1 each 3  . metoprolol tartrate (LOPRESSOR) 25 MG tablet Take 1 tablet (25 mg total) by mouth 2 (two) times daily. 60 tablet 1   No current facility-administered medications on file prior to visit.     Past Medical History:  Diagnosis Date  . Acid reflux   . Anemia 06/18/2012  . Anxiety   . Arthritis   . Chest pain 06/17/2012   MI ruled out  . Chronic abdominal pain   . Chronic prescription benzodiazepine use   . Fibromyalgia   . Hypertension   . Hypertriglyceridemia   . Opiate use   . Palpitations   . Peripheral neuropathy   . Prediabetes 06/18/2012   resolved with weight loss  . Pulmonary nodule   . Rash 06/18/2012   Query eczema  Past Surgical History:  Procedure Laterality Date  . COLONOSCOPY     found to have 1 polyp  . ESOPHAGEAL DILATION  1993  . LEFT HEART CATH AND CORONARY ANGIOGRAPHY N/A 12/09/2016   Procedure: Left Heart Cath and Coronary Angiography;  Surgeon: Yvonne Kendall, MD;  Location: [facility];  Service: Cardiovascular;  Laterality: N/A;  . TONSILLECTOMY      Family History  Problem Relation Age of Onset  . Fibromyalgia Mother   . Bladder Cancer Father 61  . Benign prostatic hyperplasia Father   . Healthy Son   . Heart failure Paternal Grandmother   . Heart failure Paternal Grandfather   . Lung cancer Paternal Grandfather   . Lung cancer Maternal Uncle   . Emphysema  Maternal Uncle   . Lung cancer Paternal Uncle   . Lung cancer Maternal Grandmother   . Stomach cancer Paternal Uncle     Social History   Social History  . Marital status: Married    Spouse name: N/A  . Number of children: N/A  . 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 Smoking status: Former Smoker    Packs/day: 1.50    Years: 35.00    Types: Cigarettes    Start date: 03/12/1979    Quit date: 03/09/2015  . Smokeless tobacco: Never Used     Comment: Smoked off & on - Peak rate 1.5  . Alcohol use No  . Drug use: No  . Sexual activity: Yes    Partners: Female   Other Topics Concern  . Not on file   Social History Narrative   Lives with wife in a 2 story home.  Has 5 children all together.  Not working at this time.  Trying to get disability.  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   Education: Some high school.      [facility] (12/13/16):   Originally from [facility]. 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 Currently has a Nurse, adult. Also has horses, a donkey, dogs, and a cat. No mold exposure. No hot tub exposure.       Objective:   Physical Exam BP 138/78 (BP Location: Left Arm, Cuff Size: Normal)   Pulse 71   Temp 98.6 F (37 C) (Oral)   Ht  (1.753 m)   Wt 192 lb 8 oz (87.3 kg)   SpO2 98%   BMI 28.43 kg/m   General:  Awake. Wife with patient today. Obviously uncomfortable. Integument:  Warm. Dry. No rash on exposed skin. Extremities:  No cyanosis or clubbing.  HEENT:  No scleral icterus. No nasal turbinate swelling. Moist pedis membranes. Cardiovascular:  Regular rate. No edema. Regular rhythm.  Pulmonary:  Diminished breath sounds in the bases. Very faint end expiratory wheeze. Normal work of breathing on room air. Abdomen: Soft. Normal bowel sounds. Protuberant. Musculoskeletal:  Normal bulk and tone. No  joint deformity or effusion appreciated.  PFT 01/16/17:  FVC 3.96 L (81%) FEV1 1.81 L (48%) FEV1/FVC 0.46 FEF 25-75 0.51 L (15%) TLC 4.96 L (73%) RV 135% ERV 216% (patient unable to perform spirometry post bronchodilator challenge & DLCO)  IMAGING PET CT 02/03/17 (per radiologist):  The largest nodule in the lingula is not FDG avid. The other nodules are too small to confidently assess with PET imaging but none demonstrate abnormal  FDG uptake. The lack of FDG uptake is reassuring. However, follow-up CT imaging in 6-12 months is recommended to ensure further stability.  CT CHEST W/O 12/25/16 (previously reviewed by me):  New bilateral pulmonary nodules noted measuring up to 4 mm. 1 cm lingula nodule without appreciable change. No pleural effusion or thickening appreciated. No pericardial effusion. No pathologic mediastinal adenopathy.  CTA CHEST/ABDOMEN/PELVIS 08/25/16 (previously reviewed by me): No pleural effusion or thickening. No pericardial effusion. No pathologic mediastinal adenopathy. 1 cm rounded nodule within the inferior lingula. No prior imaging for comparison. No pulmonary embolism. No aortic dissection. No other evidence of acute intra-abdominal pathology.  LABS 02/03/17 Alpha-1 antitrypsin: MM (153)    Assessment & Plan:  51 y.o. male with cough that has significant mucus production. Symptoms could be consistent with either bronchitis or an evolving pneumonia. Patient also has symptoms consistent with a mild exacerbation of his underlying severe COPD. As such, I am prescribing the patient medication for treatment of his COPD exacerbation as well as cough suppression while initiating treatment with an antibiotic and continuing workup to discern whether or not this is acute bronchitis or an evolving pneumonia. I did instruct the patient to notify my office if he had any questions or any clinical worsening. Also, I instructed him to notify me if he did not begin to improve in 48  hours.  1. Cough: Checking sputum AFB, fungus, and bacteria culture. Checking chest x-ray PA/LAT today. Cough suppression with Tessalon Perles. Recommended twice daily Mucinex. Starting doxycycline 100 mg by mouth twice a day 10 days.  2. Severe COPD with exacerbation: Continuing Anoro. Prescribing albuterol inhaler to use as needed and instructed to use 3-4 times daily while ill. Starting prednisone 40 mg daily 4 days. 3. Multiple lung nodules:  Plan to address imaging results at next appointment as well as timing of next scan. 4. History of asbestos exposure: No evidence of pleural or parenchymal disease. Continuing to follow with yearly pulmonary function testing and imaging. 5. Health maintenance: Status post Tdap January 2017 & Pneumovax July 2018. 6. Follow-up: Return to clinic in October as previously planned.  Donna Christen Jamison Neighbor, M.D. [facility] Pager:  [phone] After 3pm or if no response, call [phone] 3:38 PM 03/25/17

## 2017-03-25 NOTE — Patient Instructions (Signed)
   I am prescribing you an Albuterol inhaler that you will use as needed. For now do 1-2 puffs 3-4 times daily to help with your coughing and breathing until you are over your illness.  Keep taking your Anoro inhaler.  Call our office if you aren't starting to feel better in about 48 hours or if you are feeling worse.  Remember not to take your antibiotic (Doxycycline) with any dairy products. Take it with a full glass of water and remain upright for 1 hour afterwards. Also avoid excessive sunlight.  Pick up some Mucinex or Guaifenesin to help with your coughing a mucus. Take this twice daily as prescribed while you're ill.  I'm keeping your appointments in October as scheduled. Call me with any questions or concerns.   TESTS ORDERED: 1. CXR PA/LAT TODAY 2. Sputum Culture for AFB, Fungus, & Bacteria

## 2017-03-26 ENCOUNTER — Other Ambulatory Visit: Payer: BLUE CROSS/BLUE SHIELD

## 2017-03-26 DIAGNOSIS — R05 Cough: Secondary | ICD-10-CM

## 2017-03-26 DIAGNOSIS — R059 Cough, unspecified: Secondary | ICD-10-CM

## 2017-03-29 LAB — RESPIRATORY CULTURE OR RESPIRATORY AND SPUTUM CULTURE
MICRO NUMBER: 81036025
RESULT:: NORMAL
SPECIMEN QUALITY:: ADEQUATE

## 2017-04-23 DIAGNOSIS — M545 Low back pain: Secondary | ICD-10-CM | POA: Diagnosis not present

## 2017-04-24 LAB — FUNGUS CULTURE W SMEAR
MICRO NUMBER:: 81036027
SMEAR: NONE SEEN
SPECIMEN QUALITY: ADEQUATE

## 2017-04-28 ENCOUNTER — Ambulatory Visit: Payer: Self-pay | Admitting: Pulmonary Disease

## 2017-04-28 ENCOUNTER — Ambulatory Visit: Payer: Self-pay

## 2017-05-05 ENCOUNTER — Ambulatory Visit (INDEPENDENT_AMBULATORY_CARE_PROVIDER_SITE_OTHER): Payer: BLUE CROSS/BLUE SHIELD | Admitting: *Deleted

## 2017-05-05 ENCOUNTER — Ambulatory Visit (INDEPENDENT_AMBULATORY_CARE_PROVIDER_SITE_OTHER): Payer: BLUE CROSS/BLUE SHIELD | Admitting: Pulmonary Disease

## 2017-05-05 ENCOUNTER — Encounter: Payer: Self-pay | Admitting: Pulmonary Disease

## 2017-05-05 VITALS — BP 180/92 | HR 91 | Ht 69.0 in | Wt 188.2 lb

## 2017-05-05 DIAGNOSIS — R918 Other nonspecific abnormal finding of lung field: Secondary | ICD-10-CM | POA: Diagnosis not present

## 2017-05-05 DIAGNOSIS — J449 Chronic obstructive pulmonary disease, unspecified: Secondary | ICD-10-CM

## 2017-05-05 MED ORDER — FLUTICASONE-UMECLIDIN-VILANT 100-62.5-25 MCG/INH IN AEPB
1.0000 | INHALATION_SPRAY | Freq: Every day | RESPIRATORY_TRACT | 0 refills | Status: DC
Start: 2017-05-05 — End: 2023-01-08

## 2017-05-05 NOTE — Progress Notes (Signed)
Unable to perform 6 minute walk due to elevated blood pressure 170/98 right arm, 160/104 right arm and pt reported headache. Per JN, 6 minute walk was not performed.

## 2017-05-05 NOTE — Patient Instructions (Addendum)
   Continue using your medications and inhalers as prescribed.   Use the Trelegy Ellipta we are giving you today in place of your Anoro. Do one puff once daily. Call me for a prescription if this is more effective.  Remember to remove any dentures or partials you have before you use your Trelegy inhaler. Remember to brush your teeth & tongue after you use your inhaler as well as rinse, gargle & spit to keep from getting thrush in your mouth or on your tongue (a white film).   Touch base with your primary care doctor about your elevated blood pressure but it was likely due to the ham you ate yesterday.  I'm putting in a referral to a surgeon to talk about resecting your lung nodule.  We will see you back in 4 months or sooner if needed.  TESTS ORDERED: 1. CT CHEST W/O February 2019

## 2017-05-05 NOTE — Progress Notes (Signed)
Subjective:    Patient ID: Mathew Brady, male    DOB: 12/07/1965, 51 y.o.   MRN: [id number]  C.C.:  Follow-up for Severe COPD, Multiple Lung Nodules, H/O Asbestos Exposure, & Mild Restrictive Lung Disease.   HPI Severe COPD: Treated for acute exacerbation in September with Tessalon Perles and doxycycline. Also treated with a short prednisone course. Currently prescribed Anoro. He reports his breathing is doing "pretty good". He still has dyspnea on exertion. He is still coughing intermittent producing "little chunks of yellow stuff". He is using his rescue inhaler 2-3 times weekly. He is still using the [employer] Perles some. He reports increased humidity and heat seems to make his breathing & coughing worse.   Multiple lung nodules: 1 cm rounded nodule seen on imaging first in February 2018 in lingula. Patient underwent PET/CT imaging in July showing no PET avidity.  History of asbestos exposure: No evidence of pleural or portable disease on imaging. Continuing to monitor with yearly pulmonary function testing and chest x-ray imaging.  Restrictive lung disease: Mild by lung volumes. Likely due to central obesity. No evidence of interstitial lung disease on chest CT imaging. No need for further workup.  Review of Systems He reports he has been having his blood pressure medication adjusted recently. He "feels his pulse" in his temples and also has a mild occipital headache. He does have some mildly blurry vision bilaterally. No focal weakness, numbness or tingling. No frank chest pain. He does have some mild chest discomfort. No fever or chills. Still has night sweats intermittently.   No Known Allergies  Current Outpatient Prescriptions on File Prior to Visit  Medication Sig Dispense Refill  . albuterol (PROVENTIL HFA;VENTOLIN HFA) 108 (90 Base) MCG/ACT inhaler Inhale 2 puffs into the lungs every 4 (four) hours as needed for wheezing or shortness of breath. 1 Inhaler 6  . amLODipine (NORVASC) 5  MG tablet Take 5 mg by mouth daily.  4  . aspirin EC 81 MG tablet Take 1 tablet (81 mg total) by mouth daily. 90 tablet 3  . benzonatate (TESSALON) 100 MG capsule Take 1 capsule (100 mg total) by mouth every 6 (six) hours as needed for cough. 60 capsule 1  . cyanocobalamin (,VITAMIN B-12,) 1000 MCG/ML injection Inject 1 mL (1,000 mcg total) into the muscle once. (Patient taking differently: Inject 1,000 mcg into the muscle every 30 (thirty) days. ) 25 mL 0  . gabapentin (NEURONTIN) 800 MG tablet TAKE 1 TABLET BY MOUTH THREE TIMES A DAY 270 tablet 0  . HYDROcodone-acetaminophen (NORCO/VICODIN) 5-325 MG tablet Take 1 tablet by mouth every 6 (six) hours as needed for moderate pain.   0  . lisinopril (PRINIVIL,ZESTRIL) 20 MG tablet Take 20 mg by mouth daily.  4  . metoprolol tartrate (LOPRESSOR) 25 MG tablet Take 1 tablet (25 mg total) by mouth 2 (two) times daily. 60 tablet 1  . pantoprazole (PROTONIX) 40 MG tablet Take 1 tablet (40 mg total) by mouth daily. 30 tablet 0  . umeclidinium-vilanterol (ANORO ELLIPTA) 62.5-25 MCG/INH AEPB Inhale 1 puff into the lungs daily. 1 each 0   No current facility-administered medications on file prior to visit.     Past Medical History:  Diagnosis Date  . Acid reflux   . Anemia 06/18/2012  . Anxiety   . Arthritis   . Chest pain 06/17/2012   MI ruled out  . Chronic abdominal pain   . Chronic prescription benzodiazepine use   . Fibromyalgia   .  Hypertension   . Hypertriglyceridemia   . Opiate use   . Palpitations   . Peripheral neuropathy   . Prediabetes 06/18/2012   resolved with weight loss  . Pulmonary nodule   . Rash 06/18/2012   Query eczema    Past Surgical History:  Procedure Laterality Date  . COLONOSCOPY     found to have 1 polyp  . ESOPHAGEAL DILATION  1993  . LEFT HEART CATH AND CORONARY ANGIOGRAPHY N/A 12/09/2016   Procedure: Left Heart Cath and Coronary Angiography;  Surgeon: Yvonne Kendall, MD;  Location: [facility];   Service: Cardiovascular;  Laterality: N/A;  . TONSILLECTOMY      Family History  Problem Relation Age of Onset  . Fibromyalgia Mother   . Bladder Cancer Father 93  . Benign prostatic hyperplasia Father   . Healthy Son   . Heart failure Paternal Grandmother   . Heart failure Paternal Grandfather   . Lung cancer Paternal Grandfather   . Lung cancer Maternal Uncle   . Emphysema Maternal Uncle   . Lung cancer Paternal Uncle   . Lung cancer Maternal Grandmother   . Stomach cancer Paternal Uncle     Social History   Social History  . Marital status: Married    Spouse name: N/A  . Number of children: N/A  . 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 Smoking status: Former Smoker    Packs/day: 1.50    Years: 35.00    Types: Cigarettes    Start date: 03/12/1979    Quit date: 03/09/2015  . Smokeless tobacco: Never Used     Comment: Smoked off & on - Peak rate 1.5  . Alcohol use No  . Drug use: No  . Sexual activity: Yes    Partners: Female   Other Topics Concern  . None   Social History Narrative   Lives with wife in a 2 story home.  Has 5 children all together.  Not working at this time.  Trying to get disability.  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   Education: Some high school.      [facility] (12/13/16):   Originally from [facility]. 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 Currently has a Nurse, adult. Also has horses, a donkey, dogs, and a cat. No mold exposure. No hot tub exposure.       Objective:   Physical Exam BP (!) 180/92 (BP Location: Left Arm, Cuff Size: Normal)   Pulse 91   Ht 5\' 9"  (1.753 m)   Wt 188 lb 4 oz (85.4 kg)   SpO2 97%   BMI 27.80 kg/m   General:  Awake. Caucasian male. No distress Integument:  No rash. Warm. Dry. Extremities:  No cyanosis or clubbing.  HEENT:  No nasal turbinate swelling.  No scleral icterus. Moist membranes. Cardiovascular:  Regular rate. No edema. No JVD.  Pulmonary:  Symmetrically decreased breath sounds. Otherwise clear to auscultation. Normal oral breathing on room air. Abdomen: Soft. Normal bowel sounds. Nondistended.  Musculoskeletal:  Normal bulk and tone. No joint deformity or effusion appreciated. Neurological:  Cranial nerves 2-12 grossly in tact. No meningismus. Moving all 4 extremities equally.   PFT 01/16/17:  FVC 3.96 L (81%) FEV1 1.81 L (48%) FEV1/FVC 0.46 FEF 25-75 0.51 L (15%) TLC 4.96 L (73%) RV 135% ERV 216% (  patient unable to perform spirometry post bronchodilator challenge & DLCO)  IMAGING CXR PA/LAT 03/25/17 (personally reviewed by me):  No focal opacity or mass appreciated. No pleural effusion. Heart normal in size & mediastinum normal in contour.  PET CT 02/03/17 (per radiologist):  The largest nodule in the lingula is not FDG avid. The other nodules are too small to confidently assess with PET imaging but none demonstrate abnormal FDG uptake. The lack of FDG uptake is reassuring. However, follow-up CT imaging in 6-12 months is recommended to ensure further stability.  CT CHEST W/O 12/25/16 (previously reviewed by me):  New bilateral pulmonary nodules noted measuring up to 4 mm. 1 cm lingula nodule without appreciable change. No pleural effusion or thickening appreciated. No pericardial effusion. No pathologic mediastinal adenopathy.  CTA CHEST/ABDOMEN/PELVIS 08/25/16 (previously reviewed by me): No pleural effusion or thickening. No pericardial effusion. No pathologic mediastinal adenopathy. 1 cm rounded nodule within the inferior lingula. No prior imaging for comparison. No pulmonary embolism. No aortic dissection. No other evidence of acute intra-abdominal pathology.  MICROBIOLOGY Sputum Culture (03/26/17): Normal oral flora/AFB negative/fungus negative  LABS 02/03/17 Alpha-1 antitrypsin: MM (153)    Assessment & Plan:  51 y.o. male  with underlying severe COPD. I suspect this is the etiology for his ongoing cough. He seems to be recovering well from his recent exacerbation. We reviewed his chest CT scans today. The patient is very concerned about the possibility of lung cancer with his family history. We discussed that he may or may not be a candidate for resection. He still would like to discuss this further with thoracic surgery which is reasonable. For now I am scheduling a CT scan in February to continue to monitor his lung nodules. I instructed the patient to contact our office if he had new breathing problems or questions before his next appointment.  1. Severe COPD:  Patient trying Trrelegy in place of Anoro. He will contact me for a prescription if this is more effective. 2. Multiple lung nodules: Referring patient to thoracic surgery at his request for evaluation for possible resection. Repeating CT chest without contrast in February. 3. Health maintenance: Status post Tdap January 2017 & Pneumovax July 2018. 4. Follow-up: Return to clinic in or months or sooner if needed.  Donna Christen Mathew Brady, M.D. [facility] Pager:  [phone] After 3pm or if no response, call [phone] 4:27 PM 05/05/17

## 2017-05-10 LAB — MYCOBACTERIA,CULT W/FLUOROCHROME SMEAR
MICRO NUMBER:: 81036033
SMEAR:: NONE SEEN
SPECIMEN QUALITY:: ADEQUATE

## 2017-05-13 ENCOUNTER — Encounter: Payer: Self-pay | Admitting: Thoracic Surgery (Cardiothoracic Vascular Surgery)

## 2017-05-13 ENCOUNTER — Institutional Professional Consult (permissible substitution) (INDEPENDENT_AMBULATORY_CARE_PROVIDER_SITE_OTHER): Payer: BLUE CROSS/BLUE SHIELD | Admitting: Thoracic Surgery (Cardiothoracic Vascular Surgery)

## 2017-05-13 VITALS — BP 164/99 | HR 73 | Ht 69.0 in | Wt 180.0 lb

## 2017-05-13 DIAGNOSIS — R911 Solitary pulmonary nodule: Secondary | ICD-10-CM | POA: Diagnosis not present

## 2017-05-13 NOTE — Progress Notes (Signed)
PCP is Benita Stabile, MD Referring Provider is Roslynn Amble, MD  Chief Complaint  Patient presents with  . New Patient (Initial Visit)    Lung nodule, ct chest 12/25/2016, pet 02/03/2017    HPI: Mr. Tome is sent for consultation regarding a lung nodule.  Mr. Kottke is a 51 year old man with a history of tobacco abuse, COPD, acid reflux, hypertension, hypertriglyceridemia, fibromyalgia, chronic pain, anxiety, opiate and benzodiazepine use, and anemia.  He has multiple relatives who have died from lung cancer.  In 2018-02-25he suffered a fall.  He had some chest pain and a CT showed a 1 cm nodule in the lingula.  He had a repeat CT in June which showed no significant change.  There also are several other smaller nodules bilaterally.  A PET/CT was done and it showed no evidence of hypermetabolic activity in the nodule.  Plan after his PET/CT was to repeat his imaging in 6 months which would be in January.  He saw Dr. Jamison Neighbor last week.  He was very anxious about the lung nodule and wished to be referred to a surgeon.  He smoked about 2 packs a day for 30 years prior to quitting 6 months ago.  He does have shortness of breath with exertion.  He says he can walk up a flight of stairs.  He is very unclear about how much exertion causes shortness of breath.  He had a cardiac evaluation this summer including catheterization which showed only some mild plaque.  He does complain of wheezing.  He says he is not taking any of his inhalers on a daily basis is only using them 2-3 times a week.  He has dizzy spells and headaches.  He denies any change in appetite.  He has lost about 30 pounds over the past 6 months.  Zubrod Score: At the time of surgery this patient's most appropriate activity status/level should be described as: []     0    Normal activity, no symptoms [x]     1    Restricted in physical strenuous activity but ambulatory, able to do out light work []     2    Ambulatory and capable of self  care, unable to do work activities, up and about >50 % of waking hours                              []     3    Only limited self care, in bed greater than 50% of waking hours []     4    Completely disabled, no self care, confined to bed or chair []     5    Moribund  Past Medical History:  Diagnosis Date  . Acid reflux   . Anemia 06/18/2012  . Anxiety   . Arthritis   . Chest pain 06/17/2012   MI ruled out  . Chronic abdominal pain   . Chronic prescription benzodiazepine use   . Fibromyalgia   . Hypertension   . Hypertriglyceridemia   . Opiate use   . Palpitations   . Peripheral neuropathy   . Prediabetes 06/18/2012   resolved with weight loss  . Pulmonary nodule   . Rash 06/18/2012   Query eczema    Past Surgical History:  Procedure Laterality Date  . COLONOSCOPY     found to have 1 polyp  . ESOPHAGEAL DILATION  1993  . TONSILLECTOMY  Family History  Problem Relation Age of Onset  . Fibromyalgia Mother   . Bladder Cancer Father [id number]  . Benign prostatic hyperplasia Father   . Healthy Son   . Heart failure Paternal Grandmother   . Heart failure Paternal Grandfather   . Lung cancer Paternal Grandfather   . Lung cancer Maternal Uncle   . Emphysema Maternal Uncle   . Lung cancer Paternal Uncle   . Lung cancer Maternal Grandmother   . Stomach cancer Paternal Uncle     Social History Social History   Tobacco Use  . Smoking status: Former Smoker    Packs/day: 1.50    Years: 35.00    Pack years: 52.50    Types: Cigarettes    Start date: 03/12/1979    Last attempt to quit: 03/09/2015    Years since quitting: 2.1  . Smokeless tobacco: Never Used  . Tobacco comment: Smoked off & on - Peak rate 1.5  Substance Use Topics  . Alcohol use: No    Alcohol/week: 0.0 oz  . Drug use: No    Current Outpatient Medications  Medication Sig Dispense Refill  . albuterol (PROVENTIL HFA;VENTOLIN HFA) 108 (90 Base) MCG/ACT inhaler Inhale 2 puffs into the lungs every 4 (four)  hours as needed for wheezing or shortness of breath. 1 Inhaler 6  . amLODipine (NORVASC) 5 MG tablet Take 5 mg by mouth daily.  4  . aspirin EC 81 MG tablet Take 1 tablet (81 mg total) by mouth daily. 90 tablet 3  . benzonatate (TESSALON) 100 MG capsule Take 1 capsule (100 mg total) by mouth every 6 (six) hours as needed for cough. 60 capsule 1  . Fluticasone-Umeclidin-Vilant (TRELEGY ELLIPTA) 100-62.5-25 MCG/INH AEPB Inhale 1 puff into the lungs daily. 1 each 0  . gabapentin (NEURONTIN) 800 MG tablet TAKE 1 TABLET BY MOUTH THREE TIMES A DAY 270 tablet 0  . HYDROcodone-acetaminophen (NORCO/VICODIN) 5-325 MG tablet Take 1 tablet by mouth every 6 (six) hours as needed for moderate pain.   0  . lisinopril (PRINIVIL,ZESTRIL) 20 MG tablet Take 20 mg by mouth daily.  4  . pantoprazole (PROTONIX) 40 MG tablet Take 1 tablet (40 mg total) by mouth daily. 30 tablet 0  . umeclidinium-vilanterol (ANORO ELLIPTA) 62.5-25 MCG/INH AEPB Inhale 1 puff into the lungs daily. 1 each 0   No current facility-administered medications for this visit.     Allergies  Allergen Reactions  . Neurontin [Gabapentin] Other (See Comments)    Thrush in mouth    Review of Systems  Constitutional: Positive for fatigue and unexpected weight change (Has lost 30 pounds in 6 months with diet). Negative for activity change.  HENT: Positive for dental problem (Teachers). Negative for trouble swallowing and voice change.   Eyes: Positive for visual disturbance.  Respiratory: Positive for cough, shortness of breath and wheezing.   Cardiovascular: Positive for chest pain (Atypical) and palpitations.  Gastrointestinal: Positive for abdominal pain.  Musculoskeletal: Positive for arthralgias, back pain and myalgias.  Neurological: Positive for numbness and headaches. Negative for weakness.  Psychiatric/Behavioral: The patient is nervous/anxious.   All other systems reviewed and are negative.   BP (!) 164/99   Pulse 73   Ht 5\' 9"   (1.753 m)   Wt 180 lb (81.6 kg)   SpO2 96%   BMI 26.58 kg/m  Physical Exam  Constitutional: He is oriented to person, place, and time. He appears well-developed and well-nourished. No distress.  Anxious  HENT:  Head: Normocephalic and  atraumatic.  Mouth/Throat: No oropharyngeal exudate.  Eyes: Conjunctivae and EOM are normal. No scleral icterus.  Neck: No thyromegaly present.  Cardiovascular: Normal rate and regular rhythm. Exam reveals friction rub. Exam reveals no gallop.  No murmur heard. Pulmonary/Chest: Effort normal and breath sounds normal. No respiratory distress. He has no wheezes. He has no rales.  Abdominal: Soft. He exhibits no distension. There is no tenderness.  Musculoskeletal: He exhibits no edema.  Lymphadenopathy:    He has no cervical adenopathy.  Neurological: He is alert and oriented to person, place, and time. No cranial nerve deficit.  Motor grossly intact  Skin: Skin is warm and dry.  Vitals reviewed.    Diagnostic Tests: CT CHEST WITHOUT CONTRAST  TECHNIQUE: Multidetector CT imaging of the chest was performed following the standard protocol without IV contrast. Sagittal and coronal MPR images reconstructed from axial data set.  COMPARISON:  08/25/2016  FINDINGS: Cardiovascular: Mild atherosclerotic calcification aorta and minimally in coronary arteries. Aorta normal caliber. No pericardial effusion.  Mediastinum/Nodes: Esophagus unremarkable. Base of cervical region normal appearance. No thoracic adenopathy.  Lungs/Pleura: 10 x 10 x 10 mm noncalcified pulmonary nodule in lingula unchanged. 3 mm subpleural nodule versus scar RIGHT lower lobe image 77, site previously obscured by dependent atelectasis. 4 mm RIGHT middle lobe nodule image 80, in retrospect unchanged. 4 mm subpleural nodule posteromedial RIGHT lower lobe image 87, site previously obscured by dependent atelectasis. Foci of nodular thickening at LEFT major fissure image 77,  better visualized. No pulmonary infiltrate, pleural effusion or pneumothorax.  Upper Abdomen: Unremarkable  Musculoskeletal: Normal appearance  IMPRESSION: Stable 10 x 10 x 10 mm lingular nodule.  Additional pulmonary nodules bilaterally as discussed above, 3-4 mm in diameter.  Since the largest nodule is stable at for months, recommend followup CT imaging in 6 months to demonstrate continued stability.  Aortic Atherosclerosis (ICD10-I70.0).   Electronically Signed   By: Ulyses Southward M.D.   On: 12/25/2016 18:03 NUCLEAR MEDICINE PET SKULL BASE TO THIGH  TECHNIQUE: 9.0 mCi F-18 FDG was injected intravenously. Full-ring PET imaging was performed from the skull base to thigh after the radiotracer. CT data was obtained and used for attenuation correction and anatomic localization.  FASTING BLOOD GLUCOSE:  Value: 94 mg/dl  COMPARISON:  CT scans of the chest August 25, 2016 and December 25, 2016  FINDINGS: NECK: No hypermetabolic lymph nodes in the neck.  CHEST: The 10 mm nodule in the lingula demonstrates no demonstrable FDG uptake. The other nodules are too small to characterize with PET imaging but none demonstrate FDG uptake on today's study. Mild uptake in right axillary nodes is likely due to the right-sided injection  ABDOMEN/PELVIS: No abnormal hypermetabolic activity within the liver, pancreas, adrenal glands, or spleen. No hypermetabolic lymph nodes in the abdomen or pelvis.  SKELETON: No focal hypermetabolic activity to suggest skeletal metastasis.  IMPRESSION: 1. The largest nodule in the lingula is not FDG avid. The other nodules are too small to confidently assess with PET imaging but none demonstrate abnormal FDG uptake. The lack of FDG uptake is reassuring. However, follow-up CT imaging in 6-12 months is recommended to ensure further stability.   Electronically Signed   By: Gerome Sam III M.D   On: 02/03/2017 17:24 I  personally reviewed the CT and PET/CT images and concur with the findings noted above.  Impression: Mr. Coupe is a 51 year old gentleman with a history of heavy tobacco abuse before quitting 6 months ago.  He also has a strong  family history for lung cancer.  He has a 1 cm nodule in the lingula that is not hypermetabolic on PET/CT.  This could be a primary bronchogenic carcinoma but could be a more benign etiology such as a carcinoid tumor or even a hamartoma given its lack of uptake on PET.  Even with a worse case scenario that this is a lung cancer it shows no signs of being aggressive given its lack of metabolic activity.  I had a very long discussion with Mr. and Mrs. Loleta ChanceHill.  We reviewed the films.  I pointed out the other nodules including with nodules along the fissure in the left lung and the 4 mm nodule in the right lung centrally.  I suspect the nodules along the fissure are parenchymal lymph nodes.  Nodule in the right lung needs continued follow-up regardless.  We discussed the differential diagnosis.  He is very concerned about the possibility of lung cancer.  My opinion there is no compelling reason to proceed with surgery at the present time.  He is only 2 months away from being rescanned and we can reassess the nodules at that point.  With no activity on PET CT there is no reason to think he is in any danger from waiting an additional 2 months before making a decision.  I did try to discuss with him that there is downside to surgery including in his case of very high risk of chronic pain for what may be a benign nodule.  Plan: To have repeat CT of the chest in January through Dr. Crista CurbNestor's office.  I will see him back in the office after that scan as well.  Loreli SlotSteven C Hendrickson, MD [facility] [phone]

## 2017-05-21 DIAGNOSIS — R911 Solitary pulmonary nodule: Secondary | ICD-10-CM | POA: Diagnosis not present

## 2017-05-21 DIAGNOSIS — I10 Essential (primary) hypertension: Secondary | ICD-10-CM | POA: Diagnosis not present

## 2017-05-21 DIAGNOSIS — G8929 Other chronic pain: Secondary | ICD-10-CM | POA: Diagnosis not present

## 2017-05-21 DIAGNOSIS — M545 Low back pain: Secondary | ICD-10-CM | POA: Diagnosis not present

## 2017-07-11 ENCOUNTER — Telehealth: Payer: Self-pay | Admitting: Acute Care

## 2017-07-11 DIAGNOSIS — R918 Other nonspecific abnormal finding of lung field: Secondary | ICD-10-CM

## 2017-07-11 NOTE — Telephone Encounter (Signed)
Follow-up CT previously ordered by JN.  SG verbalized ok to place new order under her name.  Order placed.  Routing to chan to follow up on.   [facility] for lung screening program.

## 2017-07-11 NOTE — Telephone Encounter (Signed)
appt scheduled for 08/11/17 @ 12:45pm  @ Mathew HawkingAnnie Penn pt aware

## 2017-07-15 ENCOUNTER — Ambulatory Visit: Payer: Self-pay | Admitting: Thoracic Surgery (Cardiothoracic Vascular Surgery)

## 2017-07-15 NOTE — Telephone Encounter (Signed)
Due to pt's current age of 52, he is not eligible for lung cancer screening.  Will forward to Kandice RobinsonsSarah Groce, NP as an Lorain ChildesFYI.

## 2017-07-16 DIAGNOSIS — J019 Acute sinusitis, unspecified: Secondary | ICD-10-CM | POA: Diagnosis not present

## 2017-07-18 ENCOUNTER — Telehealth: Payer: Self-pay | Admitting: Acute Care

## 2017-07-18 DIAGNOSIS — R911 Solitary pulmonary nodule: Secondary | ICD-10-CM

## 2017-07-18 NOTE — Telephone Encounter (Signed)
SG, can you please sign the order in Epic? Thanks!

## 2017-07-22 NOTE — Telephone Encounter (Signed)
Ok order placed again with a verbal with co-sign so SG can sign off on it. PCC's it looks like this was scheduled at [facility] already.

## 2017-07-22 NOTE — Telephone Encounter (Signed)
Sarah, please advise if the CT order has been signed so we can close this encounter.  Thanks!

## 2017-07-22 NOTE — Telephone Encounter (Signed)
Pease re-order. CT  is currently ordered as a med refill and this does not cross over for co-signature. Thanks so much.

## 2017-07-25 DIAGNOSIS — J441 Chronic obstructive pulmonary disease with (acute) exacerbation: Secondary | ICD-10-CM | POA: Diagnosis not present

## 2017-08-11 ENCOUNTER — Ambulatory Visit (HOSPITAL_COMMUNITY)
Admission: RE | Admit: 2017-08-11 | Discharge: 2017-08-11 | Disposition: A | Discharge status: Home / Self Care | Payer: BLUE CROSS/BLUE SHIELD | Source: Ambulatory Visit | Attending: Acute Care | Admitting: Acute Care

## 2017-08-11 DIAGNOSIS — R911 Solitary pulmonary nodule: Secondary | ICD-10-CM

## 2017-08-11 DIAGNOSIS — J439 Emphysema, unspecified: Secondary | ICD-10-CM | POA: Diagnosis not present

## 2017-08-11 DIAGNOSIS — R918 Other nonspecific abnormal finding of lung field: Secondary | ICD-10-CM | POA: Diagnosis not present

## 2017-08-19 ENCOUNTER — Encounter: Payer: Self-pay | Admitting: Thoracic Surgery (Cardiothoracic Vascular Surgery)

## 2017-08-19 ENCOUNTER — Ambulatory Visit: Payer: BLUE CROSS/BLUE SHIELD | Admitting: Thoracic Surgery (Cardiothoracic Vascular Surgery)

## 2017-08-19 ENCOUNTER — Other Ambulatory Visit: Payer: Self-pay

## 2017-08-19 ENCOUNTER — Other Ambulatory Visit: Payer: Self-pay | Admitting: *Deleted

## 2017-08-19 VITALS — BP 146/92 | HR 85 | Resp 18 | Ht 69.0 in | Wt 199.4 lb

## 2017-08-19 DIAGNOSIS — R911 Solitary pulmonary nodule: Secondary | ICD-10-CM

## 2017-08-19 NOTE — Progress Notes (Signed)
[address]       [place] [id number]             [phone]     HPI: Mathew Brady returns for follow-up of a left upper lobe lung  Mathew Brady is a 52 year old man with a history of tobacco abuse, COPD, gastroesophageal reflux, hypertension, hypertriglyceridemia, fibromyalgia, chronic pain, anxiety, opiate and benzodiazepine use, and anemia.  He has multiple relatives who died from lung cancer.  In February 2018 he had a fall.  A CT showed a 1 cm nodule in the lingula.  There are multiple other small smaller nodules bilaterally.  He had follow-up with a PET/CT in July.  The nodule was unchanged and there was no hypermetabolic activity.  He was reassured that this was likely benign and a repeat CT was scheduled for January.  He became very anxious about the nodule and so was sent to see me in December.  I recommended he wait for the repeat scan before making a decision.  He now has had his repeat scan and returns to further discuss this issue.  He says that he feels like his breathing is a little worse.  He has been using his inhalers.  He has been smoking from time to time.  He smoked 2 packs a day for 30 years prior to 2016.  He denies any chest pain, pressure, or tightness with exertion but does sometimes get cramping in his left side.  He remains very anxious about the possibility this could be a lung cancer. Zubrod Score: At the time of surgery this patient's most appropriate activity status/level should be described as: []     0    Normal activity, no symptoms [x]     1    Restricted in physical strenuous activity but ambulatory, able to do out light work []     2    Ambulatory and capable of self care, unable to do work activities, up and about >50 % of waking hours                              []     3    Only limited self care, in bed greater than 50% of waking hours []     4    Completely disabled, no self care, confined to bed or chair []     5    Moribund   Current  Outpatient Medications  Medication Sig Dispense Refill  . albuterol (PROVENTIL HFA;VENTOLIN HFA) 108 (90 Base) MCG/ACT inhaler Inhale 2 puffs into the lungs every 4 (four) hours as needed for wheezing or shortness of breath. 1 Inhaler 6  . amLODipine (NORVASC) 5 MG tablet Take 5 mg by mouth daily.  4  . aspirin EC 81 MG tablet Take 1 tablet (81 mg total) by mouth daily. 90 tablet 3  . benzonatate (TESSALON) 100 MG capsule Take 1 capsule (100 mg total) by mouth every 6 (six) hours as needed for cough. 60 capsule 1  . Fluticasone-Umeclidin-Vilant (TRELEGY ELLIPTA) 100-62.5-25 MCG/INH AEPB Inhale 1 puff into the lungs daily. 1 each 0  . gabapentin (NEURONTIN) 800 MG tablet TAKE 1 TABLET BY MOUTH THREE TIMES A DAY 270 tablet 0  . lisinopril (PRINIVIL,ZESTRIL) 20 MG tablet Take 20 mg by mouth daily.  4  . oxyCODONE-acetaminophen (PERCOCET) 10-325 MG tablet Take 1 tablet by mouth every 4 (four) hours as needed for pain.    .Marland Kitchen  pantoprazole (PROTONIX) 40 MG tablet Take 1 tablet (40 mg total) by mouth daily. 30 tablet 0  . umeclidinium-vilanterol (ANORO ELLIPTA) 62.5-25 MCG/INH AEPB Inhale 1 puff into the lungs daily. 1 each 0   No current facility-administered medications for this visit.     Physical Exam BP (!) 146/92 (BP Location: Left Arm, Patient Position: Sitting, Cuff Size: Large)   Pulse 85   Resp 18   Ht 5\' 9"  (1.753 m)   Wt 199 lb 6.4 oz (90.4 kg)   SpO2 98% Comment: RA  BMI 29.81 kg/m  52 year old man in no acute distress Alert and oriented x3 with no focal deficits No cervical or supra clavicular adenopathy Lungs clear with equal breath sounds bilaterally Cardiac regular rate and rhythm normal S1 and S2, no murmur Abdomen soft and nontender Extremities are without clubbing cyanosis or edema  Diagnostic Tests: CT CHEST WITHOUT CONTRAST  TECHNIQUE: Multidetector CT imaging of the chest was performed following the standard protocol without IV contrast.  COMPARISON:  PET-CT  dated 02/03/2017. CT chest dated 12/25/2016 and 08/25/2016.  FINDINGS: Cardiovascular: The heart is normal in size. No pericardial effusion.  No evidence of thoracic aortic aneurysm.  Mediastinum/Nodes: No suspicious mediastinal lymphadenopathy.  Visualized thyroid is unremarkable.  Lungs/Pleura: Mild biapical pleural-parenchymal scarring.  Mild centrilobular emphysematous changes, upper lobe predominant.  11 x 10 x 10 mm lingular nodule (series 4/image 102), stable versus minimally increased from initial 2018 CT, non FDG avid on prior PET.  Additional scattered pulmonary nodules, including:  --Perifissural nodularity along the right major fissure (series 4/image 84), benign  --Perifissural nodularity along the left major fissure (series 4/images 69 and 80), benign  --4 mm right upper lobe nodule (series 4/image 76), unchanged, benign  --3 mm subpleural nodule in the posterior right lower lobe (series 4/image 84), unchanged, benign  --4 mm subpleural nodule in the posterior left lower lobe (series 4/image 94), unchanged, benign  --two left lower lobe nodules measuring up to 3 mm (series 4/image 125), unchanged, benign  No focal consolidation.  No pleural effusion or pneumothorax.  Upper Abdomen: Visualized upper abdomen is grossly unremarkable.  Musculoskeletal: Visualized osseous structures are within normal limits.  IMPRESSION: 11 mm lingular nodule, possibly minimally increased from initial CT, although non FDG avid. A benign etiology is favored. Follow-up CT chest is suggested in 6-12 months.  Additional scattered subpleural and perifissural nodularity can be definitively characterized as benign. This recommendation follows the consensus statement: Guidelines for Management of Small Pulmonary Nodules Detected on CT Images: From the Fleischner Society 2017; Radiology 2017; 284:228-243.  Emphysema (ICD10-J43.9).   Electronically  Signed   By: Charline Bills M.D. I personally reviewed the CT images and concur with the findings noted above  Impression: Mathew Brady is a 52 year old man with a history of tobacco abuse who has a 1cm lingular nodule that was first discovered incidentally on a CT scan following a fall.  This nodule was not hypermetabolic and has remained relatively stable over time.  He read the radiology report that indicated there could be a minimal growth since his last visit.  He is very anxious and wants to have the nodule removed.  I reviewed the CT images with Mathew Brady.  Showed them that although the nodule measures slightly greater than 1 dimension was slightly smaller and the other in the measurements were within the margin of error.  They understand there are multiple other smaller nodules bilaterally none of which have changed.  Had  a long discussion with Mathew Brady.  He is extraordinarily anxious about this nodule despite repeated reassurances that it is likely benign.  He is willing to accept any risk to have a nodule removed.  I did advised him my recommendation would be to continue to follow this radiographically, but if he is insistent we could do a wedge resection to remove the nodule.  I described the proposed procedure to him which is left VATS with wedge resection.  If intraoperative frozen section shows malignancy, we could do a segmentectomy, but I do not think that will be necessary.  He understands the incisions to be used, the need for general anesthesia, the use of a drainage tube postoperatively, and the expected hospital stay.  He understands the indications, risks, benefits, and alternatives.  He understands the risk include, but not limited to death, MI, DVT, PE, bleeding, possible need for transfusion, infection, prolonged air leak, chronic pain, as well as possibility of other unforeseeable complications.  He strongly wishes to proceed with surgery  Plan: Left VATS for wedge  resection on Monday, 09/08/2017  Loreli Slot, MD [facility] [phone]

## 2017-08-19 NOTE — H&P (View-Only) (Signed)
[address]       [place] [id number]             [phone]     HPI: Mr. Mathew ChanceHill returns for follow-up of a left upper lobe lung  Allena EaringJohn Laske is a 52 year old man with a history of tobacco abuse, COPD, gastroesophageal reflux, hypertension, hypertriglyceridemia, fibromyalgia, chronic pain, anxiety, opiate and benzodiazepine use, and anemia.  He has multiple relatives who died from lung cancer.  In February 2018 he had a fall.  A CT showed a 1 cm nodule in the lingula.  There are multiple other small smaller nodules bilaterally.  He had follow-up with a PET/CT in July.  The nodule was unchanged and there was no hypermetabolic activity.  He was reassured that this was likely benign and a repeat CT was scheduled for January.  He became very anxious about the nodule and so was sent to see me in December.  I recommended he wait for the repeat scan before making a decision.  He now has had his repeat scan and returns to further discuss this issue.  He says that he feels like his breathing is a little worse.  He has been using his inhalers.  He has been smoking from time to time.  He smoked 2 packs a day for 30 years prior to 2016.  He denies any chest pain, pressure, or tightness with exertion but does sometimes get cramping in his left side.  He remains very anxious about the possibility this could be a lung cancer. Zubrod Score: At the time of surgery this patient's most appropriate activity status/level should be described as: []     0    Normal activity, no symptoms [x]     1    Restricted in physical strenuous activity but ambulatory, able to do out light work []     2    Ambulatory and capable of self care, unable to do work activities, up and about >50 % of waking hours                              []     3    Only limited self care, in bed greater than 50% of waking hours []     4    Completely disabled, no self care, confined to bed or chair []     5    Moribund   Current  Outpatient Medications  Medication Sig Dispense Refill  . albuterol (PROVENTIL HFA;VENTOLIN HFA) 108 (90 Base) MCG/ACT inhaler Inhale 2 puffs into the lungs every 4 (four) hours as needed for wheezing or shortness of breath. 1 Inhaler 6  . amLODipine (NORVASC) 5 MG tablet Take 5 mg by mouth daily.  4  . aspirin EC 81 MG tablet Take 1 tablet (81 mg total) by mouth daily. 90 tablet 3  . benzonatate (TESSALON) 100 MG capsule Take 1 capsule (100 mg total) by mouth every 6 (six) hours as needed for cough. 60 capsule 1  . Fluticasone-Umeclidin-Vilant (TRELEGY ELLIPTA) 100-62.5-25 MCG/INH AEPB Inhale 1 puff into the lungs daily. 1 each 0  . gabapentin (NEURONTIN) 800 MG tablet TAKE 1 TABLET BY MOUTH THREE TIMES A DAY 270 tablet 0  . lisinopril (PRINIVIL,ZESTRIL) 20 MG tablet Take 20 mg by mouth daily.  4  . oxyCODONE-acetaminophen (PERCOCET) 10-325 MG tablet Take 1 tablet by mouth every 4 (four) hours as needed for pain.    .Marland Kitchen  pantoprazole (PROTONIX) 40 MG tablet Take 1 tablet (40 mg total) by mouth daily. 30 tablet 0  . umeclidinium-vilanterol (ANORO ELLIPTA) 62.5-25 MCG/INH AEPB Inhale 1 puff into the lungs daily. 1 each 0   No current facility-administered medications for this visit.     Physical Exam BP (!) 146/92 (BP Location: Left Arm, Patient Position: Sitting, Cuff Size: Large)   Pulse 85   Resp 18   Ht 5\' 9"  (1.753 m)   Wt 199 lb 6.4 oz (90.4 kg)   SpO2 98% Comment: RA  BMI 29.81 kg/m  52 year old man in no acute distress Alert and oriented x3 with no focal deficits No cervical or supra clavicular adenopathy Lungs clear with equal breath sounds bilaterally Cardiac regular rate and rhythm normal S1 and S2, no murmur Abdomen soft and nontender Extremities are without clubbing cyanosis or edema  Diagnostic Tests: CT CHEST WITHOUT CONTRAST  TECHNIQUE: Multidetector CT imaging of the chest was performed following the standard protocol without IV contrast.  COMPARISON:  PET-CT  dated 02/03/2017. CT chest dated 12/25/2016 and 08/25/2016.  FINDINGS: Cardiovascular: The heart is normal in size. No pericardial effusion.  No evidence of thoracic aortic aneurysm.  Mediastinum/Nodes: No suspicious mediastinal lymphadenopathy.  Visualized thyroid is unremarkable.  Lungs/Pleura: Mild biapical pleural-parenchymal scarring.  Mild centrilobular emphysematous changes, upper lobe predominant.  11 x 10 x 10 mm lingular nodule (series 4/image 102), stable versus minimally increased from initial 2018 CT, non FDG avid on prior PET.  Additional scattered pulmonary nodules, including:  --Perifissural nodularity along the right major fissure (series 4/image 84), benign  --Perifissural nodularity along the left major fissure (series 4/images 69 and 80), benign  --4 mm right upper lobe nodule (series 4/image 76), unchanged, benign  --3 mm subpleural nodule in the posterior right lower lobe (series 4/image 84), unchanged, benign  --4 mm subpleural nodule in the posterior left lower lobe (series 4/image 94), unchanged, benign  --two left lower lobe nodules measuring up to 3 mm (series 4/image 125), unchanged, benign  No focal consolidation.  No pleural effusion or pneumothorax.  Upper Abdomen: Visualized upper abdomen is grossly unremarkable.  Musculoskeletal: Visualized osseous structures are within normal limits.  IMPRESSION: 11 mm lingular nodule, possibly minimally increased from initial CT, although non FDG avid. A benign etiology is favored. Follow-up CT chest is suggested in 6-12 months.  Additional scattered subpleural and perifissural nodularity can be definitively characterized as benign. This recommendation follows the consensus statement: Guidelines for Management of Small Pulmonary Nodules Detected on CT Images: From the Fleischner Society 2017; Radiology 2017; 284:228-243.  Emphysema (ICD10-J43.9).   Electronically  Signed   By: Charline Bills M.D. I personally reviewed the CT images and concur with the findings noted above  Impression: Mr. Bass is a 52 year old man with a history of tobacco abuse who has a 1cm lingular nodule that was first discovered incidentally on a CT scan following a fall.  This nodule was not hypermetabolic and has remained relatively stable over time.  He read the radiology report that indicated there could be a minimal growth since his last visit.  He is very anxious and wants to have the nodule removed.  I reviewed the CT images with Mr. and Mrs. Boehle.  Showed them that although the nodule measures slightly greater than 1 dimension was slightly smaller and the other in the measurements were within the margin of error.  They understand there are multiple other smaller nodules bilaterally none of which have changed.  Had  a long discussion with Mr. Leverette.  He is extraordinarily anxious about this nodule despite repeated reassurances that it is likely benign.  He is willing to accept any risk to have a nodule removed.  I did advised him my recommendation would be to continue to follow this radiographically, but if he is insistent we could do a wedge resection to remove the nodule.  I described the proposed procedure to him which is left VATS with wedge resection.  If intraoperative frozen section shows malignancy, we could do a segmentectomy, but I do not think that will be necessary.  He understands the incisions to be used, the need for general anesthesia, the use of a drainage tube postoperatively, and the expected hospital stay.  He understands the indications, risks, benefits, and alternatives.  He understands the risk include, but not limited to death, MI, DVT, PE, bleeding, possible need for transfusion, infection, prolonged air leak, chronic pain, as well as possibility of other unforeseeable complications.  He strongly wishes to proceed with surgery  Plan: Left VATS for wedge  resection on Monday, 09/08/2017  Loreli Slot, MD [facility] [phone]

## 2017-09-02 ENCOUNTER — Ambulatory Visit: Payer: Self-pay | Admitting: Pulmonary Disease

## 2017-09-03 NOTE — Pre-Procedure Instructions (Signed)
Mathew Brady  09/03/2017      [id number] - [place] - [address] [place] [address] Phone: [phone] Fax: [phone]  [facility] [place] - [address] [place] [address] Phone: [phone] Fax: [phone]  [facility] - [place],  - [address] [place] [address] Phone: [phone] Fax: [phone]    Your procedure is scheduled on Monday, March 4th.   Report to [facility] Admitting at 5:30 AM             (posted surgery time 7:30a - 10:17a)   Call this number if you have problems the morning of surgery:  [phone]   Remember:              4-5 days prior to surgery, STOP TAKING ANY Vitamins, Herbal Supplements, Anti-inflammatories.   Do not eat food or drink liquids after midnight, Sunday.   Take these medicines the morning of surgery with A SIP OF WATER : Oxycodone, Metoprolol, Gabapentin.  Please use your inhalers that morning.   Do not wear jewelry - no rings or watches.  Do not wear lotions, colognes or deodorant.             Men may shave face and neck.  Do not bring valuables to the hospital.  [facility] is not responsible for any belongings or valuables.  Contacts, dentures or bridgework may not be worn into surgery.  Leave your suitcase in the car.  After surgery it may be brought to your room.  For patients admitted to the hospital, discharge time will be determined by your treatment team.  Please read over the following fact sheets that you were given. Pain Booklet, MRSA Information and Surgical Site Infection Prevention       [facility] Preparing For [facility]  Before surgery, you can play an important role. Because skin is not sterile, your skin needs to be as free of germs as possible. You can reduce the number of germs on your skin by washing with CHG (chlorahexidine gluconate) Soap before surgery.  CHG is an  antiseptic cleaner which kills germs and bonds with the skin to continue killing germs even after washing.  Please do not use if you have an allergy to CHG or antibacterial soaps. If your skin becomes reddened/irritated stop using the CHG.  Do not shave (including legs and underarms) for at least 48 hours prior to first CHG shower. It is OK to shave your face.  Please follow these instructions carefully.   1. Shower the NIGHT BEFORE SURGERY and the MORNING OF SURGERY with CHG.   2. If you chose to wash your hair, wash your hair first as usual with your normal shampoo.  3. After you shampoo, rinse your hair and body thoroughly to remove the shampoo.  4. Use CHG as you would any other liquid soap. You can apply CHG directly to the skin and wash gently with a scrungie or a clean washcloth.   5. Apply the CHG Soap to your body ONLY FROM THE NECK DOWN.  Do not use on open wounds or open sores. Avoid contact with your eyes, ears, mouth and genitals (private parts). Wash Face and genitals (private parts)  with your normal soap.  6. Wash thoroughly, paying special attention to the area where your surgery will be performed.  7. Thoroughly rinse your body  with warm water from the neck down.  8. DO NOT shower/wash with your normal soap after using and rinsing off the CHG Soap.  9. Pat yourself dry with a CLEAN TOWEL.  10. Wear CLEAN PAJAMAS to bed the night before surgery, wear comfortable clothes the morning of surgery  11. Place CLEAN SHEETS on your bed the night of your first shower and DO NOT SLEEP WITH PETS.    Day of Surgery: Do not apply any deodorants/lotions. Please wear clean clothes to the hospital/surgery center.

## 2017-09-04 ENCOUNTER — Encounter (HOSPITAL_COMMUNITY): Admission: RE | Admit: 2017-09-04 | Payer: BLUE CROSS/BLUE SHIELD | Source: Ambulatory Visit

## 2017-09-04 ENCOUNTER — Encounter (HOSPITAL_COMMUNITY)
Admission: RE | Admit: 2017-09-04 | Discharge: 2017-09-04 | Disposition: A | Discharge status: Home / Self Care | Payer: BLUE CROSS/BLUE SHIELD | Source: Ambulatory Visit | Attending: Thoracic Surgery (Cardiothoracic Vascular Surgery) | Admitting: Thoracic Surgery (Cardiothoracic Vascular Surgery)

## 2017-09-04 ENCOUNTER — Other Ambulatory Visit: Payer: Self-pay

## 2017-09-04 ENCOUNTER — Encounter (HOSPITAL_COMMUNITY): Payer: Self-pay

## 2017-09-04 DIAGNOSIS — R9431 Abnormal electrocardiogram [ECG] [EKG]: Secondary | ICD-10-CM | POA: Insufficient documentation

## 2017-09-04 DIAGNOSIS — R911 Solitary pulmonary nodule: Secondary | ICD-10-CM | POA: Diagnosis not present

## 2017-09-04 HISTORY — DX: Personal history of urinary calculi: Z87.442

## 2017-09-04 HISTORY — DX: Deficiency of other specified B group vitamins: E53.8

## 2017-09-04 HISTORY — DX: Family history of other specified conditions: Z84.89

## 2017-09-04 LAB — URINALYSIS, ROUTINE W REFLEX MICROSCOPIC
BILIRUBIN URINE: NEGATIVE
Glucose, UA: NEGATIVE mg/dL
Hgb urine dipstick: NEGATIVE
Ketones, ur: NEGATIVE mg/dL
LEUKOCYTES UA: NEGATIVE
NITRITE: NEGATIVE
PH: 6 (ref 5.0–8.0)
Protein, ur: NEGATIVE mg/dL
SPECIFIC GRAVITY, URINE: 1.006 (ref 1.005–1.030)

## 2017-09-04 LAB — BLOOD GAS, ARTERIAL
ACID-BASE EXCESS: 0.7 mmol/L (ref 0.0–2.0)
BICARBONATE: 24.5 mmol/L (ref 20.0–28.0)
Drawn by: 470591
FIO2: 21
O2 SAT: 90.9 %
PCO2 ART: 37.6 mmHg (ref 32.0–48.0)
PH ART: 7.431 (ref 7.350–7.450)
PO2 ART: 54.2 mmHg — AB (ref 83.0–108.0)
Patient temperature: 98.6

## 2017-09-04 LAB — SURGICAL PCR SCREEN
MRSA, PCR: NEGATIVE
STAPHYLOCOCCUS AUREUS: POSITIVE — AB

## 2017-09-04 LAB — ABO/RH: ABO/RH(D): B POS

## 2017-09-04 LAB — TYPE AND SCREEN
ABO/RH(D): B POS
ANTIBODY SCREEN: NEGATIVE

## 2017-09-04 NOTE — Progress Notes (Signed)
Mupirocin Ointment Rx called into [facility] in [place] for positive PCR of Staph. Pt notified and voiced understanding. 

## 2017-09-04 NOTE — Progress Notes (Signed)
PCP is Dr. Hughie ClossZ Hall  LOV 07/2017 Did have heart cath in 12/2016 by Dr. Salena Saner End. Pulm is Dr. Staci AcostaJ Nestor Denies murmur, cp, does have some sob with exertion

## 2017-09-08 ENCOUNTER — Inpatient Hospital Stay (HOSPITAL_COMMUNITY): Payer: BLUE CROSS/BLUE SHIELD | Admitting: Certified Registered Nurse Anesthetist

## 2017-09-08 ENCOUNTER — Inpatient Hospital Stay (HOSPITAL_COMMUNITY)
Admission: RE | Admit: 2017-09-08 | Discharge: 2017-09-11 | DRG: 829 | Disposition: A | Discharge status: Home / Self Care | Payer: BLUE CROSS/BLUE SHIELD | Source: Ambulatory Visit | Attending: Thoracic Surgery (Cardiothoracic Vascular Surgery) | Admitting: Thoracic Surgery (Cardiothoracic Vascular Surgery)

## 2017-09-08 ENCOUNTER — Inpatient Hospital Stay (HOSPITAL_COMMUNITY): Payer: BLUE CROSS/BLUE SHIELD

## 2017-09-08 ENCOUNTER — Other Ambulatory Visit: Payer: Self-pay

## 2017-09-08 ENCOUNTER — Encounter (HOSPITAL_COMMUNITY): Payer: Self-pay

## 2017-09-08 ENCOUNTER — Encounter (HOSPITAL_COMMUNITY)
Admission: RE | Disposition: A | Discharge status: Home / Self Care | Payer: Self-pay | Source: Ambulatory Visit | Attending: Thoracic Surgery (Cardiothoracic Vascular Surgery)

## 2017-09-08 DIAGNOSIS — R911 Solitary pulmonary nodule: Secondary | ICD-10-CM | POA: Diagnosis present

## 2017-09-08 DIAGNOSIS — I10 Essential (primary) hypertension: Secondary | ICD-10-CM | POA: Diagnosis not present

## 2017-09-08 DIAGNOSIS — J441 Chronic obstructive pulmonary disease with (acute) exacerbation: Secondary | ICD-10-CM | POA: Diagnosis not present

## 2017-09-08 DIAGNOSIS — J449 Chronic obstructive pulmonary disease, unspecified: Secondary | ICD-10-CM | POA: Diagnosis present

## 2017-09-08 DIAGNOSIS — Q859 Phakomatosis, unspecified: Secondary | ICD-10-CM | POA: Diagnosis not present

## 2017-09-08 DIAGNOSIS — E785 Hyperlipidemia, unspecified: Secondary | ICD-10-CM | POA: Diagnosis not present

## 2017-09-08 DIAGNOSIS — G8929 Other chronic pain: Secondary | ICD-10-CM | POA: Diagnosis present

## 2017-09-08 DIAGNOSIS — J939 Pneumothorax, unspecified: Secondary | ICD-10-CM | POA: Diagnosis not present

## 2017-09-08 DIAGNOSIS — J9383 Other pneumothorax: Secondary | ICD-10-CM | POA: Diagnosis not present

## 2017-09-08 DIAGNOSIS — F17200 Nicotine dependence, unspecified, uncomplicated: Secondary | ICD-10-CM | POA: Diagnosis not present

## 2017-09-08 DIAGNOSIS — Z7951 Long term (current) use of inhaled steroids: Secondary | ICD-10-CM | POA: Diagnosis not present

## 2017-09-08 DIAGNOSIS — J984 Other disorders of lung: Secondary | ICD-10-CM | POA: Diagnosis not present

## 2017-09-08 DIAGNOSIS — M797 Fibromyalgia: Secondary | ICD-10-CM | POA: Diagnosis present

## 2017-09-08 DIAGNOSIS — Z01818 Encounter for other preprocedural examination: Secondary | ICD-10-CM

## 2017-09-08 DIAGNOSIS — F419 Anxiety disorder, unspecified: Secondary | ICD-10-CM | POA: Diagnosis present

## 2017-09-08 DIAGNOSIS — Z79899 Other long term (current) drug therapy: Secondary | ICD-10-CM

## 2017-09-08 DIAGNOSIS — Z801 Family history of malignant neoplasm of trachea, bronchus and lung: Secondary | ICD-10-CM

## 2017-09-08 DIAGNOSIS — J9811 Atelectasis: Secondary | ICD-10-CM | POA: Diagnosis not present

## 2017-09-08 DIAGNOSIS — Z7982 Long term (current) use of aspirin: Secondary | ICD-10-CM

## 2017-09-08 DIAGNOSIS — K219 Gastro-esophageal reflux disease without esophagitis: Secondary | ICD-10-CM | POA: Diagnosis present

## 2017-09-08 DIAGNOSIS — Z4682 Encounter for fitting and adjustment of non-vascular catheter: Secondary | ICD-10-CM

## 2017-09-08 HISTORY — PX: VIDEO ASSISTED THORACOSCOPY (VATS)/WEDGE RESECTION: SHX6174

## 2017-09-08 LAB — PROTIME-INR
INR: 1
Prothrombin Time: 13.1 seconds (ref 11.4–15.2)

## 2017-09-08 LAB — COMPREHENSIVE METABOLIC PANEL
ALBUMIN: 3.8 g/dL (ref 3.5–5.0)
ALT: 15 U/L — AB (ref 17–63)
AST: 18 U/L (ref 15–41)
Alkaline Phosphatase: 75 U/L (ref 38–126)
Anion gap: 10 (ref 5–15)
BUN: 14 mg/dL (ref 6–20)
CHLORIDE: 106 mmol/L (ref 101–111)
CO2: 23 mmol/L (ref 22–32)
CREATININE: 1 mg/dL (ref 0.61–1.24)
Calcium: 9.1 mg/dL (ref 8.9–10.3)
GFR calc Af Amer: >60 mL/min (ref >60)
GFR calc non Af Amer: >60 mL/min (ref >60)
Glucose, Bld: 101 mg/dL — ABNORMAL HIGH (ref 65–99)
Potassium: 4 mmol/L (ref 3.5–5.1)
SODIUM: 139 mmol/L (ref 135–145)
Total Bilirubin: 0.5 mg/dL (ref 0.3–1.2)
Total Protein: 6.2 g/dL — ABNORMAL LOW (ref 6.5–8.1)

## 2017-09-08 LAB — CBC
HCT: 42.3 % (ref 39.0–52.0)
Hemoglobin: 13.9 g/dL (ref 13.0–17.0)
MCH: 31.9 pg (ref 26.0–34.0)
MCHC: 32.9 g/dL (ref 30.0–36.0)
MCV: 97 fL (ref 78.0–100.0)
PLATELETS: 253 10*3/uL (ref 150–400)
RBC: 4.36 MIL/uL (ref 4.22–5.81)
RDW: 12.8 % (ref 11.5–15.5)
WBC: 5.7 10*3/uL (ref 4.0–10.5)

## 2017-09-08 LAB — APTT: aPTT: 34 seconds (ref 24–36)

## 2017-09-08 SURGERY — VIDEO ASSISTED THORACOSCOPY (VATS)/WEDGE RESECTION
Anesthesia: General | Site: Chest | Laterality: Left

## 2017-09-08 MED ORDER — FENTANYL 40 MCG/ML IV SOLN
INTRAVENOUS | Status: DC
  Administered 2017-09-08: 285 ug via INTRAVENOUS
  Administered 2017-09-08: 180 ug via INTRAVENOUS
  Administered 2017-09-08: 135 ug via INTRAVENOUS
  Administered 2017-09-08 – 2017-09-09 (×2): via INTRAVENOUS
  Administered 2017-09-09 (×2): 105 ug via INTRAVENOUS
  Administered 2017-09-09: 30 ug via INTRAVENOUS
  Administered 2017-09-09: 45 ug via INTRAVENOUS
  Administered 2017-09-09: 180 ug via INTRAVENOUS
  Administered 2017-09-09: 90 ug via INTRAVENOUS
  Administered 2017-09-10: 105 ug via INTRAVENOUS
  Administered 2017-09-10: 75 ug via INTRAVENOUS
  Administered 2017-09-10: 60 ug via INTRAVENOUS
  Filled 2017-09-08 (×4): qty 25

## 2017-09-08 MED ORDER — SODIUM CHLORIDE 0.9 % IJ SOLN
INTRAMUSCULAR | Status: DC | PRN
  Administered 2017-09-08: 50 mL via INTRAVENOUS

## 2017-09-08 MED ORDER — SUGAMMADEX SODIUM 200 MG/2ML IV SOLN
INTRAVENOUS | Status: DC | PRN
  Administered 2017-09-08: 200 mg via INTRAVENOUS

## 2017-09-08 MED ORDER — SODIUM CHLORIDE 0.9% FLUSH: 9.0000 mL | INTRAVENOUS | Status: DC | PRN

## 2017-09-08 MED ORDER — ACETAMINOPHEN 160 MG/5ML PO SOLN: 1000.0000 mg | Freq: Four times a day (QID) | ORAL | Status: DC

## 2017-09-08 MED ORDER — FENTANYL CITRATE (PF) 250 MCG/5ML IJ SOLN
INTRAMUSCULAR | Status: AC
  Filled 2017-09-08: qty 5

## 2017-09-08 MED ORDER — UMECLIDINIUM-VILANTEROL 62.5-25 MCG/INH IN AEPB
1.0000 | INHALATION_SPRAY | Freq: Every day | RESPIRATORY_TRACT | Status: DC
  Administered 2017-09-10: 1 via RESPIRATORY_TRACT
  Filled 2017-09-08 (×2): qty 14

## 2017-09-08 MED ORDER — CALCIUM CHLORIDE 10 % IV SOLN
INTRAVENOUS | Status: AC
  Filled 2017-09-08: qty 20

## 2017-09-08 MED ORDER — BUDESONIDE 0.25 MG/2ML IN SUSP
0.2500 mg | Freq: Two times a day (BID) | RESPIRATORY_TRACT | Status: DC
  Administered 2017-09-08 – 2017-09-11 (×6): 0.25 mg via RESPIRATORY_TRACT
  Filled 2017-09-08 (×6): qty 2

## 2017-09-08 MED ORDER — KETOROLAC TROMETHAMINE 15 MG/ML IJ SOLN
15.0000 mg | Freq: Four times a day (QID) | INTRAMUSCULAR | Status: AC
  Administered 2017-09-08 – 2017-09-10 (×8): 15 mg via INTRAVENOUS
  Filled 2017-09-08 (×9): qty 1

## 2017-09-08 MED ORDER — UMECLIDINIUM-VILANTEROL 62.5-25 MCG/INH IN AEPB
1.0000 | INHALATION_SPRAY | Freq: Two times a day (BID) | RESPIRATORY_TRACT | Status: DC
  Filled 2017-09-08: qty 14

## 2017-09-08 MED ORDER — POTASSIUM CHLORIDE 10 MEQ/50ML IV SOLN: 10.0000 meq | Freq: Every day | INTRAVENOUS | Status: DC | PRN

## 2017-09-08 MED ORDER — PROPOFOL 10 MG/ML IV BOLUS
INTRAVENOUS | Status: AC
  Filled 2017-09-08: qty 40

## 2017-09-08 MED ORDER — HYDROMORPHONE HCL 1 MG/ML IJ SOLN
INTRAMUSCULAR | Status: AC
  Filled 2017-09-08: qty 1

## 2017-09-08 MED ORDER — HYDROMORPHONE HCL 1 MG/ML IJ SOLN
0.2500 mg | INTRAMUSCULAR | Status: DC | PRN
  Administered 2017-09-08 (×4): 0.5 mg via INTRAVENOUS

## 2017-09-08 MED ORDER — ONDANSETRON HCL 4 MG/2ML IJ SOLN: 4.0000 mg | Freq: Once | INTRAMUSCULAR | Status: DC | PRN

## 2017-09-08 MED ORDER — CEFAZOLIN SODIUM-DEXTROSE 2-4 GM/100ML-% IV SOLN
2.0000 g | Freq: Three times a day (TID) | INTRAVENOUS | Status: AC
  Administered 2017-09-08 – 2017-09-09 (×2): 2 g via INTRAVENOUS
  Filled 2017-09-08 (×2): qty 100

## 2017-09-08 MED ORDER — BUDESONIDE 0.25 MG/2ML IN SUSP: 0.2500 mg | Freq: Every day | RESPIRATORY_TRACT | Status: DC

## 2017-09-08 MED ORDER — 0.9 % SODIUM CHLORIDE (POUR BTL) OPTIME
TOPICAL | Status: DC | PRN
  Administered 2017-09-08: 2000 mL

## 2017-09-08 MED ORDER — MIDAZOLAM HCL 2 MG/2ML IJ SOLN
INTRAMUSCULAR | Status: AC
  Filled 2017-09-08: qty 2

## 2017-09-08 MED ORDER — OXYCODONE HCL 5 MG PO TABS
10.0000 mg | ORAL_TABLET | ORAL | Status: DC | PRN
  Administered 2017-09-08 – 2017-09-09 (×5): 10 mg via ORAL
  Administered 2017-09-09: 15 mg via ORAL
  Administered 2017-09-09: 10 mg via ORAL
  Administered 2017-09-10 – 2017-09-11 (×5): 15 mg via ORAL
  Filled 2017-09-08: qty 2
  Filled 2017-09-08: qty 3
  Filled 2017-09-08: qty 2
  Filled 2017-09-08: qty 3
  Filled 2017-09-08: qty 2
  Filled 2017-09-08 (×3): qty 3
  Filled 2017-09-08 (×2): qty 2
  Filled 2017-09-08: qty 3
  Filled 2017-09-08: qty 2

## 2017-09-08 MED ORDER — ACETAMINOPHEN 500 MG PO TABS
1000.0000 mg | ORAL_TABLET | Freq: Four times a day (QID) | ORAL | Status: DC
  Administered 2017-09-08 – 2017-09-11 (×9): 1000 mg via ORAL
  Filled 2017-09-08 (×10): qty 2

## 2017-09-08 MED ORDER — BISACODYL 5 MG PO TBEC
10.0000 mg | DELAYED_RELEASE_TABLET | Freq: Every day | ORAL | Status: DC
  Administered 2017-09-09: 10 mg via ORAL
  Filled 2017-09-08 (×3): qty 2

## 2017-09-08 MED ORDER — POTASSIUM CHLORIDE IN NACL 20-0.45 MEQ/L-% IV SOLN
INTRAVENOUS | Status: DC
  Administered 2017-09-08 – 2017-09-09 (×2): via INTRAVENOUS
  Filled 2017-09-08 (×4): qty 1000

## 2017-09-08 MED ORDER — SENNOSIDES-DOCUSATE SODIUM 8.6-50 MG PO TABS
1.0000 | ORAL_TABLET | Freq: Every day | ORAL | Status: DC
  Administered 2017-09-08: 1 via ORAL
  Filled 2017-09-08 (×2): qty 1

## 2017-09-08 MED ORDER — BUPIVACAINE LIPOSOME 1.3 % IJ SUSP
20.0000 mL | INTRAMUSCULAR | Status: AC
Start: 2017-09-08 — End: 2017-09-08
  Administered 2017-09-08: 20 mL
  Filled 2017-09-08: qty 20

## 2017-09-08 MED ORDER — NALOXONE HCL 0.4 MG/ML IJ SOLN: 0.4000 mg | INTRAMUSCULAR | Status: DC | PRN

## 2017-09-08 MED ORDER — SODIUM CHLORIDE 0.9 % IV SOLN
1.5000 g | INTRAVENOUS | Status: AC
  Administered 2017-09-08: 1.5 g via INTRAVENOUS
  Filled 2017-09-08: qty 1.5

## 2017-09-08 MED ORDER — DIPHENHYDRAMINE HCL 50 MG/ML IJ SOLN: 12.5000 mg | Freq: Four times a day (QID) | INTRAMUSCULAR | Status: DC | PRN

## 2017-09-08 MED ORDER — LIDOCAINE 2% (20 MG/ML) 5 ML SYRINGE
INTRAMUSCULAR | Status: DC | PRN
  Administered 2017-09-08: 100 mg via INTRAVENOUS

## 2017-09-08 MED ORDER — DEXTROSE 5 % IV SOLN
INTRAVENOUS | Status: DC | PRN
  Administered 2017-09-08: 50 ug/min via INTRAVENOUS
  Administered 2017-09-08: 25 ug/min via INTRAVENOUS

## 2017-09-08 MED ORDER — MEPERIDINE HCL 50 MG/ML IJ SOLN: 6.2500 mg | INTRAMUSCULAR | Status: DC | PRN

## 2017-09-08 MED ORDER — ONDANSETRON HCL 4 MG/2ML IJ SOLN: 4.0000 mg | Freq: Four times a day (QID) | INTRAMUSCULAR | Status: DC | PRN

## 2017-09-08 MED ORDER — BUDESONIDE 0.25 MG/2ML IN SUSP: 0.2500 mg | Freq: Two times a day (BID) | RESPIRATORY_TRACT | Status: DC

## 2017-09-08 MED ORDER — FLUTICASONE-UMECLIDIN-VILANT 100-62.5-25 MCG/INH IN AEPB: 1.0000 | INHALATION_SPRAY | Freq: Every day | RESPIRATORY_TRACT | Status: DC

## 2017-09-08 MED ORDER — METOPROLOL SUCCINATE ER 25 MG PO TB24
25.0000 mg | ORAL_TABLET | Freq: Every day | ORAL | Status: DC
  Administered 2017-09-09 – 2017-09-11 (×3): 25 mg via ORAL
  Filled 2017-09-08 (×3): qty 1

## 2017-09-08 MED ORDER — UMECLIDINIUM-VILANTEROL 62.5-25 MCG/INH IN AEPB: 1.0000 | INHALATION_SPRAY | Freq: Every day | RESPIRATORY_TRACT | Status: DC

## 2017-09-08 MED ORDER — DIPHENHYDRAMINE HCL 12.5 MG/5ML PO ELIX
12.5000 mg | ORAL_SOLUTION | Freq: Four times a day (QID) | ORAL | Status: DC | PRN
  Filled 2017-09-08: qty 5

## 2017-09-08 MED ORDER — PROPOFOL 10 MG/ML IV BOLUS
INTRAVENOUS | Status: DC | PRN
  Administered 2017-09-08: 200 mg via INTRAVENOUS

## 2017-09-08 MED ORDER — MIDAZOLAM HCL 2 MG/2ML IJ SOLN
INTRAMUSCULAR | Status: DC | PRN
  Administered 2017-09-08: 2 mg via INTRAVENOUS

## 2017-09-08 MED ORDER — ALBUTEROL SULFATE (2.5 MG/3ML) 0.083% IN NEBU: 3.0000 mL | INHALATION_SOLUTION | RESPIRATORY_TRACT | Status: DC | PRN

## 2017-09-08 MED ORDER — ASPIRIN EC 81 MG PO TBEC
81.0000 mg | DELAYED_RELEASE_TABLET | Freq: Every day | ORAL | Status: DC
  Administered 2017-09-09 – 2017-09-11 (×3): 81 mg via ORAL
  Filled 2017-09-08 (×3): qty 1

## 2017-09-08 MED ORDER — PHENYLEPHRINE 40 MCG/ML (10ML) SYRINGE FOR IV PUSH (FOR BLOOD PRESSURE SUPPORT): PREFILLED_SYRINGE | INTRAVENOUS | Status: DC | PRN

## 2017-09-08 MED ORDER — FENTANYL CITRATE (PF) 250 MCG/5ML IJ SOLN
INTRAMUSCULAR | Status: DC | PRN
  Administered 2017-09-08: 50 ug via INTRAVENOUS
  Administered 2017-09-08 (×2): 100 ug via INTRAVENOUS

## 2017-09-08 MED ORDER — GABAPENTIN 400 MG PO CAPS
800.0000 mg | ORAL_CAPSULE | Freq: Three times a day (TID) | ORAL | Status: DC
  Administered 2017-09-08 – 2017-09-11 (×9): 800 mg via ORAL
  Filled 2017-09-08 (×9): qty 2

## 2017-09-08 MED ORDER — ONDANSETRON HCL 4 MG/2ML IJ SOLN
INTRAMUSCULAR | Status: DC | PRN
  Administered 2017-09-08: 4 mg via INTRAVENOUS

## 2017-09-08 MED ORDER — ROCURONIUM BROMIDE 10 MG/ML (PF) SYRINGE
PREFILLED_SYRINGE | INTRAVENOUS | Status: DC | PRN
  Administered 2017-09-08: 50 mg via INTRAVENOUS

## 2017-09-08 MED ORDER — ALPRAZOLAM 0.5 MG PO TABS
0.5000 mg | ORAL_TABLET | Freq: Every day | ORAL | Status: DC
  Administered 2017-09-08 – 2017-09-10 (×3): 0.5 mg via ORAL
  Filled 2017-09-08 (×3): qty 1

## 2017-09-08 MED ORDER — ENOXAPARIN SODIUM 40 MG/0.4ML ~~LOC~~ SOLN
40.0000 mg | Freq: Every day | SUBCUTANEOUS | Status: DC
  Administered 2017-09-09 – 2017-09-10 (×2): 40 mg via SUBCUTANEOUS
  Filled 2017-09-08 (×2): qty 0.4

## 2017-09-08 MED ORDER — LACTATED RINGERS IV SOLN
INTRAVENOUS | Status: DC | PRN
  Administered 2017-09-08 (×2): via INTRAVENOUS

## 2017-09-08 SURGICAL SUPPLY — 97 items
ADH SKN CLS APL DERMABOND .7 (GAUZE/BANDAGES/DRESSINGS) ×2
APL SKNCLS STERI-STRIP NONHPOA (GAUZE/BANDAGES/DRESSINGS) ×1
BAG SPEC RTRVL LRG 6X4 10 (ENDOMECHANICALS)
BENZOIN TINCTURE PRP APPL 2/3 (GAUZE/BANDAGES/DRESSINGS) ×2 IMPLANT
CANISTER SUCT 3000ML PPV (MISCELLANEOUS) ×4 IMPLANT
CATH KIT ON Q 5IN SLV (PAIN MANAGEMENT) IMPLANT
CATH THORACIC 28FR (CATHETERS) ×1 IMPLANT
CATH THORACIC 36FR (CATHETERS) IMPLANT
CATH THORACIC 36FR RT ANG (CATHETERS) IMPLANT
CLIP VESOCCLUDE MED 6/CT (CLIP) ×2 IMPLANT
CONN ST 1/4X3/8  BEN (MISCELLANEOUS) ×1
CONN ST 1/4X3/8 BEN (MISCELLANEOUS) IMPLANT
CONN Y 3/8X3/8X3/8  BEN (MISCELLANEOUS)
CONN Y 3/8X3/8X3/8 BEN (MISCELLANEOUS) IMPLANT
CONT SPEC 4OZ CLIKSEAL STRL BL (MISCELLANEOUS) ×4 IMPLANT
COVER SURGICAL LIGHT HANDLE (MISCELLANEOUS) ×2 IMPLANT
DERMABOND ADVANCED (GAUZE/BANDAGES/DRESSINGS) ×2
DERMABOND ADVANCED .7 DNX12 (GAUZE/BANDAGES/DRESSINGS) ×1 IMPLANT
DRAIN CHANNEL 28F RND 3/8 FF (WOUND CARE) IMPLANT
DRAIN CHANNEL 32F RND 10.7 FF (WOUND CARE) IMPLANT
DRAPE LAPAROSCOPIC ABDOMINAL (DRAPES) ×2 IMPLANT
DRAPE WARM FLUID 44X44 (DRAPE) ×2 IMPLANT
ELECT BLADE 6.5 EXT (BLADE) ×2 IMPLANT
ELECT REM PT RETURN 9FT ADLT (ELECTROSURGICAL) ×2
ELECTRODE REM PT RTRN 9FT ADLT (ELECTROSURGICAL) ×1 IMPLANT
GAUZE SPONGE 4X4 12PLY STRL (GAUZE/BANDAGES/DRESSINGS) ×2 IMPLANT
GLOVE BIOGEL PI IND STRL 6 (GLOVE) IMPLANT
GLOVE BIOGEL PI INDICATOR 6 (GLOVE) ×1
GLOVE SURG SIGNA 7.5 PF LTX (GLOVE) ×4 IMPLANT
GLOVE SURG SS PI 6.0 STRL IVOR (GLOVE) ×1 IMPLANT
GLOVE SURG SS PI 6.5 STRL IVOR (GLOVE) ×1 IMPLANT
GOWN STRL NON-REIN LRG LVL3 (GOWN DISPOSABLE) ×1 IMPLANT
GOWN STRL REUS W/ TWL LRG LVL3 (GOWN DISPOSABLE) ×2 IMPLANT
GOWN STRL REUS W/ TWL XL LVL3 (GOWN DISPOSABLE) ×2 IMPLANT
GOWN STRL REUS W/TWL LRG LVL3 (GOWN DISPOSABLE) ×4
GOWN STRL REUS W/TWL XL LVL3 (GOWN DISPOSABLE) ×4
HANDLE STAPLE ENDO GIA SHORT (STAPLE)
HEMOSTAT SURGICEL 2X14 (HEMOSTASIS) ×1 IMPLANT
KIT BASIN OR (CUSTOM PROCEDURE TRAY) ×2 IMPLANT
KIT ROOM TURNOVER OR (KITS) ×2 IMPLANT
KIT SUCTION CATH 14FR (SUCTIONS) ×2 IMPLANT
NDL SPNL 18GX3.5 QUINCKE PK (NEEDLE) IMPLANT
NEEDLE SPNL 18GX3.5 QUINCKE PK (NEEDLE) ×2 IMPLANT
NS IRRIG 1000ML POUR BTL (IV SOLUTION) ×6 IMPLANT
PACK CHEST (CUSTOM PROCEDURE TRAY) ×2 IMPLANT
PAD ARMBOARD 7.5X6 YLW CONV (MISCELLANEOUS) ×4 IMPLANT
POUCH ENDO CATCH II 15MM (MISCELLANEOUS) IMPLANT
POUCH SPECIMEN RETRIEVAL 10MM (ENDOMECHANICALS) IMPLANT
RELOAD STAPLE 60 3.8 GOLD REG (STAPLE) IMPLANT
RELOAD STAPLER GOLD 60MM (STAPLE) ×4 IMPLANT
SCISSORS ENDO CVD 5DCS (MISCELLANEOUS) IMPLANT
SEALANT PROGEL (MISCELLANEOUS) IMPLANT
SEALANT SURG COSEAL 4ML (VASCULAR PRODUCTS) IMPLANT
SEALANT SURG COSEAL 8ML (VASCULAR PRODUCTS) IMPLANT
SHEARS HARMONIC HDI 20CM (ELECTROSURGICAL) IMPLANT
SOLUTION ANTI FOG 6CC (MISCELLANEOUS) ×2 IMPLANT
SPECIMEN JAR MEDIUM (MISCELLANEOUS) ×2 IMPLANT
SPONGE INTESTINAL PEANUT (DISPOSABLE) ×2 IMPLANT
SPONGE TONSIL 1 RF SGL (DISPOSABLE) ×2 IMPLANT
STAPLE ECHEON FLEX 60 POW ENDO (STAPLE) ×1 IMPLANT
STAPLER ENDO GIA 12 SHRT THIN (STAPLE) IMPLANT
STAPLER ENDO GIA 12MM SHORT (STAPLE) IMPLANT
STAPLER RELOAD GOLD 60MM (STAPLE) ×8
SUT PROLENE 4 0 RB 1 (SUTURE)
SUT PROLENE 4-0 RB1 .5 CRCL 36 (SUTURE) IMPLANT
SUT SILK  1 MH (SUTURE) ×2
SUT SILK 1 MH (SUTURE) ×2 IMPLANT
SUT SILK 1 TIES 10X30 (SUTURE) ×2 IMPLANT
SUT SILK 2 0 SH (SUTURE) IMPLANT
SUT SILK 2 0SH CR/8 30 (SUTURE) IMPLANT
SUT SILK 3 0 SH 30 (SUTURE) IMPLANT
SUT SILK 3 0SH CR/8 30 (SUTURE) ×2 IMPLANT
SUT VIC AB 0 CTX 27 (SUTURE) IMPLANT
SUT VIC AB 1 CTX 27 (SUTURE) ×2 IMPLANT
SUT VIC AB 1 CTX 36 (SUTURE) ×2
SUT VIC AB 1 CTX36XBRD ANBCTR (SUTURE) IMPLANT
SUT VIC AB 2-0 CT1 27 (SUTURE) ×2
SUT VIC AB 2-0 CT1 TAPERPNT 27 (SUTURE) IMPLANT
SUT VIC AB 2-0 CTX 36 (SUTURE) ×2 IMPLANT
SUT VIC AB 3-0 MH 27 (SUTURE) IMPLANT
SUT VIC AB 3-0 SH 27 (SUTURE)
SUT VIC AB 3-0 SH 27X BRD (SUTURE) IMPLANT
SUT VIC AB 3-0 X1 27 (SUTURE) ×2 IMPLANT
SUT VICRYL 0 UR6 27IN ABS (SUTURE) IMPLANT
SUT VICRYL 2 TP 1 (SUTURE) IMPLANT
SWAB CULTURE ESWAB REG 1ML (MISCELLANEOUS) IMPLANT
SYR 20CC LL (SYRINGE) ×1 IMPLANT
SYSTEM SAHARA CHEST DRAIN ATS (WOUND CARE) ×2 IMPLANT
TAPE CLOTH SURG 6X10 WHT LF (GAUZE/BANDAGES/DRESSINGS) ×1 IMPLANT
TIP APPLICATOR SPRAY EXTEND 16 (VASCULAR PRODUCTS) IMPLANT
TOWEL GREEN STERILE (TOWEL DISPOSABLE) ×2 IMPLANT
TOWEL GREEN STERILE FF (TOWEL DISPOSABLE) ×2 IMPLANT
TRAY FOLEY W/METER SILVER 16FR (SET/KITS/TRAYS/PACK) ×2 IMPLANT
TROCAR XCEL BLADELESS 5X75MML (TROCAR) ×2 IMPLANT
TROCAR XCEL NON-BLD 5MMX100MML (ENDOMECHANICALS) IMPLANT
TUNNELER SHEATH ON-Q 11GX8 DSP (PAIN MANAGEMENT) IMPLANT
WATER STERILE IRR 1000ML POUR (IV SOLUTION) ×4 IMPLANT

## 2017-09-08 NOTE — Anesthesia Procedure Notes (Signed)
Central Venous Catheter Insertion Performed by: Gaynelle AduFitzgerald, Ardis Fullwood, MD, anesthesiologist Start/End3/10/2017 6:20 AM, 09/08/2017 6:30 AM Patient location: Pre-op. Preanesthetic checklist: patient identified, IV checked, site marked, risks and benefits discussed, surgical consent, monitors and equipment checked, pre-op evaluation, timeout performed and anesthesia consent Position: Trendelenburg Lidocaine 1% used for infiltration and patient sedated Hand hygiene performed , maximum sterile barriers used  and Seldinger technique used Catheter size: 8 Fr Total catheter length 16. Central line was placed.Double lumen Procedure performed without using ultrasound guided technique. Attempts: 1 Following insertion, dressing applied, line sutured and Biopatch. Post procedure assessment: blood return through all ports  Patient tolerated the procedure well with no immediate complications.

## 2017-09-08 NOTE — Anesthesia Preprocedure Evaluation (Signed)
Anesthesia Evaluation  Patient identified by MRN, date of birth, ID band Patient awake    Reviewed: Allergy & Precautions, NPO status , Patient's Chart, lab work & pertinent test results  Airway Mallampati: I  TM Distance: >3 FB Neck ROM: Full    Dental   Pulmonary COPD, Current Smoker,    Pulmonary exam normal        Cardiovascular hypertension, Pt. on medications Normal cardiovascular exam     Neuro/Psych Anxiety    GI/Hepatic GERD  Medicated and Controlled,  Endo/Other    Renal/GU      Musculoskeletal   Abdominal   Peds  Hematology   Anesthesia Other Findings   Reproductive/Obstetrics                             Anesthesia Physical Anesthesia Plan  ASA: III  Anesthesia Plan: General   Post-op Pain Management:    Induction: Intravenous  PONV Risk Score and Plan: 1 and Ondansetron  Airway Management Planned: Double Lumen EBT  Additional Equipment: Arterial line, CVP and Ultrasound Guidance Line Placement  Intra-op Plan:   Post-operative Plan: Extubation in OR  Informed Consent: I have reviewed the patients History and Physical, chart, labs and discussed the procedure including the risks, benefits and alternatives for the proposed anesthesia with the patient or authorized representative who has indicated his/her understanding and acceptance.     Plan Discussed with: CRNA and Surgeon  Anesthesia Plan Comments:         Anesthesia Quick Evaluation

## 2017-09-08 NOTE — Anesthesia Procedure Notes (Signed)
Arterial Line Insertion Performed by: Elliot DallyHuggins, Diana, CRNA, CRNA  Preanesthetic checklist: patient identified, IV checked, site marked, risks and benefits discussed, surgical consent, monitors and equipment checked, pre-op evaluation, timeout performed and anesthesia consent Patient sedated Right, radial was placed Catheter size: 20 G Hand hygiene performed , maximum sterile barriers used  and Seldinger technique used Allen's test indicative of satisfactory collateral circulation Attempts: 2 Procedure performed without using ultrasound guided technique. Following insertion, Biopatch and dressing applied. Post procedure assessment: normal  Patient tolerated the procedure well with no immediate complications.

## 2017-09-08 NOTE — Brief Op Note (Signed)
09/08/2017  9:05 AM  PATIENT:  Nicholaus CorollaJohn H Lady  52 y.o. male  PRE-OPERATIVE DIAGNOSIS:  LEFT LUNG NODULE  POST-OPERATIVE DIAGNOSIS:  HAMARTOMA  PROCEDURE:  Procedure(s): VIDEO ASSISTED THORACOSCOPY (VATS)/WEDGE RESECTION (Left) INTERCOSTAL NERVE BLOCK  SURGEON:  Surgeon(s) and Role:    * Loreli SlotHendrickson, Akoni Parton C, MD - Primary  PHYSICIAN ASSISTANT:   ASSISTANTS: Hoy FinlayHeather Carter, CST   ANESTHESIA:   general  EBL:  30 mL   BLOOD ADMINISTERED:none  DRAINS: 1 Chest Tube(s) in the left   LOCAL MEDICATIONS USED:  OTHER 20 ml liposomal bupivicaine  SPECIMEN:  Source of Specimen:  left upper lobe wedge  DISPOSITION OF SPECIMEN:  PATHOLOGY  COUNTS:  YES  TOURNIQUET:  * No tourniquets in log *  DICTATION: .Other Dictation: Dictation Number -  PLAN OF CARE: Admit to inpatient   PATIENT DISPOSITION:  PACU - hemodynamically stable.   Delay start of Pharmacological VTE agent (>24hrs) due to surgical blood loss or risk of bleeding: no

## 2017-09-08 NOTE — Transfer of Care (Signed)
Immediate Anesthesia Transfer of Care Note  Patient: Mathew CorollaJohn H Fetch  Procedure(s) Performed: VIDEO ASSISTED THORACOSCOPY (VATS)/WEDGE RESECTION (Left Chest)  Patient Location: PACU  Anesthesia Type:General  Level of Consciousness: awake and alert   Airway & Oxygen Therapy: Patient Spontanous Breathing and Patient connected to nasal cannula oxygen  Post-op Assessment: Report given to RN and Post -op Vital signs reviewed and stable  Post vital signs: Reviewed and stable  Last Vitals:  Vitals:   09/08/17 0545 09/08/17 0927  BP: 133/77   Pulse: 60   Resp: 18   Temp: 36.6 C (!) 36.2 C  SpO2: 95%     Last Pain:  Vitals:   09/08/17 0545  TempSrc: Oral      Patients Stated Pain Goal: 6 (09/08/17 [id number])  Complications: No apparent anesthesia complications

## 2017-09-08 NOTE — Op Note (Signed)
Mathew Brady, Mathew Brady NO.:  [id number]  MEDICAL RECORD NO.:  [id number]  LOCATION:  [facility]                         FACILITY:  [facility]  PHYSICIAN:  Salvatore Decent. Dorris Fetch, M.D.DATE OF BIRTH:  Sep 04, 1965  DATE OF PROCEDURE:  09/08/2017 DATE OF DISCHARGE:                              OPERATIVE REPORT   PREOPERATIVE DIAGNOSIS:  Left upper lobe lung nodule.  POSTOPERATIVE DIAGNOSIS:  Hamartoma.  PROCEDURES:   Left video-assisted thoracoscopy,  Wedge resection lingular nodule Intercostal nerve block.  SURGEON:  Salvatore Decent. Dorris Fetch, MD.  ASSISTANT:  Hoy Finlay, CST.  ANESTHESIA:  General.  FINDINGS:  1-cm nodule in the lingula.  Frozen section showed hamartoma.  CLINICAL NOTE:  [name] is a 52 year old gentleman with a history of tobacco abuse, who was found to have a lung nodule.  This nodule was not hypermetabolic on PET-CT and he was advised to continue followup.  He developed extreme anxiety about the nodule despite repeated reassurances that it was most likely benign.  He wished to have the nodule removed. The indications, risks, benefits, and alternatives were discussed in detail with the patient and he understood and accepted the risks and agreed to proceed.  OPERATIVE NOTE:  [name] was brought to the preoperative holding area on September 08, 2017.  Anesthesia placed a central line and an arterial blood pressure monitoring line.  He was taken to the operating room, anesthetized, and intubated with a double-lumen endotracheal tube. Intravenous antibiotics were administered.  Sequential compression devices were placed on the calves for DVT prophylaxis.  A Foley catheter was placed.  He was placed in a right lateral decubitus position and the left chest was prepped and draped in the usual sterile fashion.  Single lung ventilation of the right lung was initiated and it was tolerated well throughout the procedure.  An incision was made in the seventh  interspace in the midaxillary line. A 5-mm port was inserted into the chest.  The thoracoscope was advanced into the chest.  The lung was isolated with no cross ventilation.  A 4-cm working incision was made in the fifth interspace anterolaterally.  The nodule was easily palpable within the lingula.  A wedge resection was performed with sequential firings of an Echelon powered endoscopic stapler.  60-mm gold cartridges were used.  The specimen was removed and sent for frozen section.  While awaiting the results of the frozen section, an intercostal nerve block was performed from levels 3-9 with 20 mL of liposomal bupivacaine diluted to 70 mL overall.  The incisions at the port site and the working incision were injected with liposomal bupivacaine as well.  A 28-French chest tube was placed through the original port incision and directed to the apex.  It was secured with #1 silk suture.  The left lung was reinflated.  The frozen section returned showing hamartoma. The incision was closed in 3 layers in standard fashion.  Dermabond was applied.  The chest tube was placed to suction, and  the patient was placed back in a supine position.  He was extubated in the operating room, taken to the postanesthetic care unit in good condition.  Salvatore DecentSteven C. Dorris FetchHendrickson, M.D.     SCH/MEDQ  D:  09/08/2017  T:  09/08/2017  Job:  [id number]

## 2017-09-08 NOTE — Interval H&P Note (Signed)
History and Physical Interval Note:  09/08/2017 7:22 AM  Mathew Brady  has presented today for surgery, with the diagnosis of LEFT LUNG NODULE  The various methods of treatment have been discussed with the patient and family. After consideration of risks, benefits and other options for treatment, the patient has consented to  Procedure(s): VIDEO ASSISTED THORACOSCOPY (VATS)/WEDGE RESECTION (Left) as a surgical intervention .  The patient's history has been reviewed, patient examined, no change in status, stable for surgery.  I have reviewed the patient's chart and labs.  Questions were answered to the patient's satisfaction.     Loreli SlotSteven C Hendrickson

## 2017-09-08 NOTE — Anesthesia Procedure Notes (Signed)
Procedure Name: Intubation Date/Time: 09/08/2017 7:54 AM Performed by: Valda Favia, CRNA Pre-anesthesia Checklist: Patient identified, Emergency Drugs available, Suction available, Patient being monitored and Timeout performed Patient Re-evaluated:Patient Re-evaluated prior to induction Oxygen Delivery Method: Circle system utilized Preoxygenation: Pre-oxygenation with 100% oxygen Induction Type: IV induction Ventilation: Mask ventilation without difficulty Laryngoscope Size: Mac and 4 Grade View: Grade I Endobronchial tube: Left, EBT position confirmed by auscultation, EBT position confirmed by fiberoptic bronchoscope and Double lumen EBT and 39 Fr Number of attempts: 1 Airway Equipment and Method: Stylet Placement Confirmation: ETT inserted through vocal cords under direct vision,  positive ETCO2 and breath sounds checked- equal and bilateral Tube secured with: Tape Dental Injury: Teeth and Oropharynx as per pre-operative assessment  Comments: Viva sight Double lumen tube utilized

## 2017-09-08 NOTE — Anesthesia Postprocedure Evaluation (Signed)
Anesthesia Post Note  Patient: Mathew Brady  Procedure(s) Performed: VIDEO ASSISTED THORACOSCOPY (VATS)/WEDGE RESECTION (Left Chest)     Patient location during evaluation: PACU Anesthesia Type: General Level of consciousness: awake and alert Pain management: pain level controlled Vital Signs Assessment: post-procedure vital signs reviewed and stable Respiratory status: spontaneous breathing, nonlabored ventilation, respiratory function stable and patient connected to nasal cannula oxygen Cardiovascular status: blood pressure returned to baseline and stable Postop Assessment: no apparent nausea or vomiting Anesthetic complications: no    Last Vitals:  Vitals:   09/08/17 1230 09/08/17 1308  BP: (!) 144/83   Pulse: 73 80  Resp: 14 19  Temp:  36.8 C  SpO2: 97% 98%    Last Pain:  Vitals:   09/08/17 0949  TempSrc:   PainSc: 10-Worst pain ever                 Jamey Harman DAVID

## 2017-09-09 ENCOUNTER — Inpatient Hospital Stay (HOSPITAL_COMMUNITY): Payer: BLUE CROSS/BLUE SHIELD

## 2017-09-09 ENCOUNTER — Encounter (HOSPITAL_COMMUNITY): Payer: Self-pay | Admitting: Thoracic Surgery (Cardiothoracic Vascular Surgery)

## 2017-09-09 LAB — BASIC METABOLIC PANEL
Anion gap: 13 (ref 5–15)
BUN: 7 mg/dL (ref 6–20)
CHLORIDE: 104 mmol/L (ref 101–111)
CO2: 22 mmol/L (ref 22–32)
Calcium: 8.5 mg/dL — ABNORMAL LOW (ref 8.9–10.3)
Creatinine, Ser: 1.05 mg/dL (ref 0.61–1.24)
GFR calc non Af Amer: >60 mL/min (ref >60)
Glucose, Bld: 129 mg/dL — ABNORMAL HIGH (ref 65–99)
POTASSIUM: 3.7 mmol/L (ref 3.5–5.1)
SODIUM: 139 mmol/L (ref 135–145)

## 2017-09-09 LAB — CBC
HCT: 38.4 % — ABNORMAL LOW (ref 39.0–52.0)
HEMOGLOBIN: 12.8 g/dL — AB (ref 13.0–17.0)
MCH: 32.6 pg (ref 26.0–34.0)
MCHC: 33.3 g/dL (ref 30.0–36.0)
MCV: 97.7 fL (ref 78.0–100.0)
Platelets: 251 10*3/uL (ref 150–400)
RBC: 3.93 MIL/uL — AB (ref 4.22–5.81)
RDW: 12.7 % (ref 11.5–15.5)
WBC: 11.3 10*3/uL — ABNORMAL HIGH (ref 4.0–10.5)

## 2017-09-09 NOTE — Progress Notes (Signed)
[address]       [place] [id number]             [phone]      1 Day Post-Op Procedure(s) (LRB): VIDEO ASSISTED THORACOSCOPY (VATS)/WEDGE RESECTION (Left) Subjective: Some incis soreness Wants to eat  Objective: Vital signs in last 24 hours: Temp:  [97.2 F (36.2 C)-99.6 F (37.6 C)] 99 F (37.2 C) (03/05 0344) Pulse Rate:  [56-93] 76 (03/05 0344) Cardiac Rhythm: Normal sinus rhythm (03/05 0700) Resp:  [13-25] 18 (03/05 0347) BP: (93-154)/(51-94) 134/76 (03/05 0344) SpO2:  [93 %-100 %] 97 % (03/05 0347) Arterial Line BP: (78-176)/(63-154) 128/98 (03/05 0344)  Hemodynamic parameters for last 24 hours:    Intake/Output from previous day: 03/04 0701 - 03/05 0700 In: 2438.3 [P.O.:600; I.V.:1638.3; IV Piggyback:200] Out: 3720 [Urine:3550; Blood:30; Chest Tube:140] Intake/Output this shift: No intake/output data recorded.  General appearance: alert, cooperative and no distress Heart: regular rate and rhythm Lungs: mildly dim in left base Abdomen: benign Extremities: no edema Wound: incis healing well  Lab Results: Recent Labs    09/08/17 0614 09/09/17 0400  WBC 5.7 11.3*  HGB 13.9 12.8*  HCT 42.3 38.4*  PLT 253 251   BMET:  Recent Labs    09/08/17 0614 09/09/17 0400  NA 139 139  K 4.0 3.7  CL 106 104  CO2 23 22  GLUCOSE 101* 129*  BUN 14 7  CREATININE 1.00 1.05  CALCIUM 9.1 8.5*    PT/INR:  Recent Labs    09/08/17 0614  LABPROT 13.1  INR 1.00   ABG    Component Value Date/Time   PHART 7.431 09/04/2017 1434   HCO3 24.5 09/04/2017 1434   TCO2 30 08/25/2016 1834   O2SAT 90.9 09/04/2017 1434   CBG (last 3)  No results for input(s): GLUCAP in the last 72 hours.  Meds Scheduled Meds: . acetaminophen  1,000 mg Oral Q6H   Or  . acetaminophen (TYLENOL) oral liquid 160 mg/5 mL  1,000 mg Oral Q6H  . ALPRAZolam  0.5 mg Oral QHS  . aspirin EC  81 mg Oral Daily  . bisacodyl  10 mg Oral Daily  . budesonide (PULMICORT)  nebulizer solution  0.25 mg Nebulization BID  . enoxaparin (LOVENOX) injection  40 mg Subcutaneous Daily  . fentaNYL   Intravenous Q4H  . gabapentin  800 mg Oral TID  . ketorolac  15 mg Intravenous Q6H  . metoprolol succinate  25 mg Oral Daily  . senna-docusate  1 tablet Oral QHS  . umeclidinium-vilanterol  1 puff Inhalation Daily   Continuous Infusions: . 0.45 % NaCl with KCl 20 mEq / L 100 mL/hr at 09/09/17 0207  . potassium chloride     PRN Meds:.albuterol, diphenhydrAMINE **OR** diphenhydrAMINE, naloxone **AND** sodium chloride flush, ondansetron (ZOFRAN) IV, oxyCODONE, potassium chloride  Xrays Dg Chest 2 View  Result Date: 09/08/2017 CLINICAL DATA:  Pre-op, pt denies any chest complaints. EXAM: CHEST  2 VIEW COMPARISON:  Chest x-ray dated 03/25/2017. chest CT dated 08/11/2017. FINDINGS: Heart size and mediastinal contours are normal. Previously described lingular nodule is stable. Lungs otherwise clear. No pleural effusion. Osseous structures about the chest are unremarkable. IMPRESSION: 1. No acute findings. 2. Stable lingular nodule. Electronically Signed   By: Bary RichardStan  Maynard M.D.   On: 09/08/2017 07:08   Dg Chest Port 1 View  Result Date: 09/09/2017 CLINICAL DATA:  Chest tube EXAM: PORTABLE CHEST 1 VIEW COMPARISON:  09/08/2017 FINDINGS: Postoperative changes  on the left. Left chest tube remains in place. No pneumothorax. Left subclavian central line is unchanged. Mild cardiomegaly and vascular congestion. Low volumes with bibasilar atelectasis. IMPRESSION: Postoperative changes on the left without pneumothorax. Low volumes with bibasilar atelectasis. Electronically Signed   By: Charlett Nose M.D.   On: 09/09/2017 07:27   Dg Chest Port 1 View  Result Date: 09/08/2017 CLINICAL DATA:  Lung nodule, video assisted thoracoscopy/wedge resection(left). Hx of COPD, hypertension, prediabetes, pulmonary nodule, left heart cath and coronary angiography(12/09/16). Smoker(39 years). EXAM: PORTABLE  CHEST - 1 VIEW COMPARISON:  09/08/2017 FINDINGS: If left chest tube directed towards the apex. No pneumothorax. Postop changes in the left lower lung. Right lung clear. Left subclavian central line tip near the confluence of the innominate vein and SVC. Heart size and mediastinal contours are within normal limits. No effusion. Visualized bones unremarkable. IMPRESSION: 1. Postop changes with  left chest tube, no pneumothorax. 2. Central line tip near the proximal SVC. Electronically Signed   By: Corlis Leak M.D.   On: 09/08/2017 09:56    Assessment/Plan: S/P Procedure(s) (LRB): VIDEO ASSISTED THORACOSCOPY (VATS)/WEDGE RESECTION (Left)  1 doing well 2 no air leak, place to water seal, only 100 cc drainage 3 BP elevated at times- follow on beta blocker home dose  4 d/c aline, foley 5 regular diet, decrease IVF 6 pain is pretty well controlled, recieved Exparel intraop- cont current meds 7 pulm toilet/rehab, current nebs- atx on CXR 8 very minor ABL anemia 9 renal fxn is in normal range    LOS: 1 day    Rowe Clack 09/09/2017

## 2017-09-09 NOTE — Progress Notes (Signed)
Waste in sink 1.5975mL fentanyl PCA with Raymon MuttonWhitney Davis, RN.

## 2017-09-10 ENCOUNTER — Inpatient Hospital Stay (HOSPITAL_COMMUNITY): Payer: BLUE CROSS/BLUE SHIELD

## 2017-09-10 MED ORDER — SODIUM CHLORIDE 0.9% FLUSH: 10.0000 mL | INTRAVENOUS | Status: DC | PRN

## 2017-09-10 MED ORDER — SODIUM CHLORIDE 0.9% FLUSH
10.0000 mL | Freq: Two times a day (BID) | INTRAVENOUS | Status: DC
  Administered 2017-09-10 (×2): 10 mL

## 2017-09-10 MED ORDER — FENTANYL 40 MCG/ML IV SOLN
INTRAVENOUS | Status: AC
  Administered 2017-09-10: 195 ug via INTRAVENOUS
  Filled 2017-09-10 (×2): qty 25

## 2017-09-10 NOTE — Discharge Summary (Addendum)
[address]       [place] [address]             [phone]       Physician Discharge Summary  Patient ID: Mathew Brady MRN: [id number] DOB/AGE: 53-Jun-1967 53 y.o.  Admit date: 09/08/2017 Discharge date: 09/11/2017  Admission Diagnoses: Patient Active Problem List   Diagnosis Date Noted  . Lung nodule 09/08/2017    Discharge Diagnoses:  Hamartoma left upper lobe  Discharged Condition: good  HPI:   Mathew Brady is a 52 year old man with a history of tobacco abuse, COPD, gastroesophageal reflux, hypertension, hypertriglyceridemia, fibromyalgia, chronic pain, anxiety, opiate and benzodiazepine use, and anemia.  He has multiple relatives who died from lung cancer.  In 02/24/18he had a fall.  A CT showed a 1 cm nodule in the lingula.  There are multiple other small smaller nodules bilaterally.  He had follow-up with a PET/CT in July.  The nodule was unchanged and there was no hypermetabolic activity.  He was reassured that this was likely benign and a repeat CT was scheduled for January.  He became very anxious about the nodule and so was sent to see me in December.  I recommended he wait for the repeat scan before making a decision.  He now has had his repeat scan and returns to further discuss this issue.  He says that he feels like his breathing is a little worse.  He has been using his inhalers.  He has been smoking from time to time.  He smoked 2 packs a day for 30 years prior to 2016.  He denies any chest pain, pressure, or tightness with exertion but does sometimes get cramping in his left side.  He remains very anxious about the possibility this could be a lung cancer.   Hospital Course:  On September 08 2017 [facility] underwent a left video-assisted thoracoscopic surgery with wedge resection and intercostal nerve block with Dr. Dorris Fetch.  He tolerated the procedure well and was transferred to [facility] for continued care.  Postop day 1 he had some incisional  soreness.  Overall he was doing quite well.  His chest tube was placed on waterseal since there was no air leak.  It had only drained about 100 cc in 24 hours.  We discontinued the arterial line and the Foley catheter.  We decreased the IV fluids and encouraged her regular diet.  We started his home dose beta-blocker due to hypertension.  Postop day 2 he continued to progress.  There was no pneumothorax on chest x-ray therefore the chest tube was discontinued.  We continued subcu Lovenox for DVT prophylaxis.  We continue to encourage ambulation.  We discontinued his PCA once his chest tube was removed. He has had some hypertension and will be started on low dose ACE-I. He has continued to progress nicely and at time of discharge is felt to be quite stable   Consults: None  Significant Diagnostic Studies:  CLINICAL DATA:  Status post wedge resection on the left for pulmonary nodule. Chest tube placement.  EXAM: PORTABLE CHEST 1 VIEW  COMPARISON:  Portable chest x-ray of September 09, 2017  FINDINGS: The lungs are mildly hypoinflated. There is a less than 5% left apical pneumothorax. The left chest tube tip projects over the posterior aspect of the left third rib. There is patchy airspace opacity and associated surgical suture material adjacent to the lower left heart border which appears stable. The  right lung exhibits minimal basilar atelectasis. The heart and pulmonary vascularity are normal. The left subclavian venous catheter tip projects at the junction of the right and left brachiocephalic veins.  IMPRESSION: 5% or less left apical pneumothorax. Stable appearance of the left chest tube. Stable increased density in the left lower hemithorax. Minimal right basilar atelectasis, stable   Electronically Signed   By: David  [place] M.D.   On: 09/10/2017 09:03  Treatments:  NAME:  Mathew Brady NO.:  [id number]  MEDICAL RECORD NO.:  [id number]  LOCATION:   [facility]                         FACILITY:  [facility]  PHYSICIAN:  Salvatore Decent. Dorris Fetch, M.D.DATE OF BIRTH:  1965-11-19  DATE OF PROCEDURE:  09/08/2017 DATE OF DISCHARGE:                              OPERATIVE REPORT   PREOPERATIVE DIAGNOSIS:  Left upper lobe lung nodule.  POSTOPERATIVE DIAGNOSIS:  Hamartoma.  PROCEDURES:  Left video-assisted thoracoscopy, wedge resection, intercostal nerve block.  SURGEON:  Salvatore Decent. Dorris Fetch, MD.  ASSISTANT:  Hoy Finlay, CST.  ANESTHESIA:  General.  FINDINGS:  1-cm nodule in the lingula.  Frozen section showed hamartoma.    Discharge Exam: Blood pressure (!) 141/97, pulse 72, temperature 98.1 F (36.7 C), temperature source Oral, resp. rate 17, height 5\' 9"  (1.753 m), weight 194 lb (88 kg), SpO2 95 %.    General appearance: alert, cooperative and no distress Heart: regular rate and rhythm, S1, S2 normal, no murmur, click, rub or gallop Lungs: clear to auscultation bilaterally Abdomen: soft, non-tender; bowel sounds normal; no masses,  no organomegaly Extremities: extremities normal, atraumatic, no cyanosis or edema Wound: clean and dry    Disposition: 01-Home or Self Care  Discharge Instructions    Discharge patient   Complete by:  As directed    Discharge disposition:  01-Home or Self Care   Discharge patient date:  09/11/2017     Allergies as of 09/11/2017   No Known Allergies     Medication List    STOP taking these medications   oxyCODONE-acetaminophen 10-325 MG tablet Commonly known as:  PERCOCET     TAKE these medications   acetaminophen 500 MG tablet Commonly known as:  TYLENOL Take 2 tablets (1,000 mg total) by mouth every 6 (six) hours.   albuterol 108 (90 Base) MCG/ACT inhaler Commonly known as:  PROVENTIL HFA;VENTOLIN HFA Inhale 2 puffs into the lungs every 4 (four) hours as needed for wheezing or shortness of breath.   ALPRAZolam 0.5 MG tablet Commonly known as:  XANAX Take 0.5 mg by mouth  at bedtime.   aspirin EC 81 MG tablet Take 1 tablet (81 mg total) by mouth daily.   budesonide 0.25 MG/2ML nebulizer solution Commonly known as:  PULMICORT Take 2 mLs (0.25 mg total) by nebulization 2 (two) times daily.   Fluticasone-Umeclidin-Vilant 100-62.5-25 MCG/INH Aepb Commonly known as:  TRELEGY ELLIPTA Inhale 1 puff into the lungs daily.   gabapentin 800 MG tablet Commonly known as:  NEURONTIN TAKE 1 TABLET BY MOUTH THREE TIMES A DAY   lisinopril 5 MG tablet Commonly known as:  PRINIVIL,ZESTRIL Take 1 tablet (5 mg total) by mouth daily.   metoprolol succinate 25 MG 24  hr tablet Commonly known as:  TOPROL-XL Take 25 mg by mouth daily.   Oxycodone HCl 10 MG Tabs Take 1 tablet (10 mg total) by mouth every 6 (six) hours as needed.      Follow-up Information    Loreli SlotHendrickson, Manvir Thorson C, MD Follow up.   Specialty:  Cardiothoracic Surgery Why:  Your appointment is on 09/30/2017 at 3:00pm. Please arrive at 2:30pm [facility] which is on the first floor of our building.  Contact information: [address] [place] [id number] [phone]        Benita StabileHall, Nihaal Z, MD. Call in 1 day(s).   Specialty:  Internal Medicine Contact information: [address] [place] [address] [phone]           Signed: Rowe ClackWayne E Gold 09/11/2017, 10:00 AM

## 2017-09-10 NOTE — Progress Notes (Signed)
7cc of fentanyl PCA wasted in sink with Rolla EtienneKatelyn Rall, RN.

## 2017-09-10 NOTE — Discharge Instructions (Signed)
Thoracotomy, Care After ° °This sheet gives you information about how to care for yourself after your procedure. Your health care provider may also give you more specific instructions. If you have problems or questions, contact your health care provider. °What can I expect after the procedure? °After your procedure, it is common to have: °· Pain and swelling around the incision area. °· Pain when you breathe in (inhale). °· Constipation. °· Fatigue. °· Loss of appetite. °· Trouble sleeping. °· Mood swings and depression. ° °Follow these instructions at home: °Preventing pneumonia °· Take deep breaths or do breathing exercises as instructed by your health care provider. °· Cough frequently. Coughing may cause discomfort, but it is important to clear mucus (phlegm) and expand your lungs. If coughing hurts, hold a pillow against your chest or place both hands flat on top of the incision (splinting) when you cough. This may help relieve discomfort. °· Continue to use an incentive spirometer as directed. This is a tool that measures how well you fill your lungs with each breath. °· Participate in pulmonary rehabilitation as directed. This is a program that combines education, exercise, and support from a team of specialists. The goal is to help you heal and return to normal activities as soon as possible. °Medicines °· Take over-the-counter or prescription medicines only as told by your health care provider. °· If you have pain, take pain-relieving medicine before your pain becomes severe. This is important because if your pain is under control, you will be able to breathe and cough more comfortably. °· If you were prescribed an antibiotic medicine, take it as told by your health care provider. Do not stop taking the antibiotic even if you start to feel better. °Activity °· Ask your health care provider what activities are safe for you. °· Do not travel by airplane for 2 weeks after your chest tube is removed, or until  your health care provider says that this is safe. °· Do not lift anything that is heavier than 10 lb (4.5 kg), or the limit that your health care provider tells you, until he or she says that it is safe. °· Do not drive until your health care provider approves. °? Do not drive or use heavy machinery while taking prescription pain medicine. °Incision care °· Follow instructions from your health care provider about how to take care of your incision. Make sure you: °? Wash your hands with soap and water before you change your bandage (dressing). If soap and water are not available, use hand sanitizer. °? Change your dressing as told by your health care provider. °? Leave stitches (sutures), skin glue, or adhesive strips in place. These skin closures may need to stay in place for 2 weeks or longer. If adhesive strip edges start to loosen and curl up, you may trim the loose edges. Do not remove adhesive strips completely unless your health care provider tells you to do that. °· Keep your dressing dry. °· Check your incision area every day for signs of infection. Check for: °? More redness, swelling, or pain. °? More fluid or blood. °? Warmth. °? Pus or a bad smell. °Bathing °· Do not take baths, swim, or use a hot tub until your health care provider approves. You may take showers. °· After your dressing has been removed, use soap and water to gently wash your incision area. Do not use anything else to clean your incision unless your health care provider tells you to do that. °Eating   and drinking °· Eat a healthy diet as instructed by your health care provider. A healthy diet includes plenty of fresh fruits and vegetables, whole grains, and low-fat (lean) proteins. °· Drink enough fluid to keep your urine clear or pale yellow. °General instructions °· To prevent or treat constipation while you are taking prescription pain medicine, your health care provider may recommend that you: °? Take over-the-counter or prescription  medicines. °? Eat foods that are high in fiber, such as fresh fruits and vegetables, whole grains, and beans. °? Limit foods that are high in fat and processed sugars, such as fried and sweet foods. °· Do not use any products that contain nicotine or tobacco, such as cigarettes and e-cigarettes. If you need help quitting, ask your health care provider. °· Avoid secondhand smoke. °· Wear compression stockings as told by your health care provider. These stockings help to prevent blood clots and reduce swelling in your legs. °· If you have a chest tube, care for it as instructed. °· Keep all follow-up visits as told by your health care provider. This is important. °Contact a health care provider if: °· You have more redness, swelling, or pain around your incision. °· You have more fluid or blood coming from your incision. °· Your incision feels warm to the touch. °· You have pus or a bad smell coming from your incision. °· You have a fever or chills. °· Your heartbeat seems irregular. °· You have nausea or vomiting. °· You have muscle aches. °· You are constipated. This may mean that you have: °? Fewer bowel movements in a week than normal. °? Difficulty having a bowel movement. °? Stools that are dry, hard, or larger than normal. °Get help right away if: °· You develop a rash. °· You feel light-headed or feel like you are going to faint. °· You have shortness of breath or trouble breathing. °· You are confused. °· You have trouble speaking. °· You have vision problems. °· You are not able to move. °· You have numbness in your face, arms, or legs. °· You lose consciousness. °· You have a sudden, severe headache. °· You feel weak. °· You have chest pain. °· You have pain that: °? Is severe. °? Gets worse, even with medicine. °Summary °· To prevent pneumonia, take deep breaths, do breathing exercises, and cough frequently, as instructed by your health care provider. °· Do not drive until your health care provider  approves. Do not travel by airplane for 2 weeks after your chest tube is removed, or until your health care provider approves. °· Check your incision area every day for signs of infection. °· Eat a healthy diet that includes plenty of fresh fruits and vegetables, whole grains, and low-fat (lean) proteins. °This information is not intended to replace advice given to you by your health care provider. Make sure you discuss any questions you have with your health care provider. °Document Released: 12/07/2010 Document Revised: 03/18/2016 Document Reviewed: 03/18/2016 °Elsevier Interactive Patient Education © 2017 [employer] Inc[employer] ° °

## 2017-09-10 NOTE — Progress Notes (Addendum)
2 Days Post-Op Procedure(s) (LRB): VIDEO ASSISTED THORACOSCOPY (VATS)/WEDGE RESECTION (Left) Subjective: Some soreness but pain well controlled for the most part  Objective: Vital signs in last 24 hours: Temp:  [98.2 F (36.8 C)-99.5 F (37.5 C)] 99.5 F (37.5 C) (03/06 0309) Pulse Rate:  [60-78] 77 (03/06 0309) Cardiac Rhythm: Normal sinus rhythm (03/06 0309) Resp:  [15-21] 16 (03/06 0509) BP: (112-139)/(60-89) 134/81 (03/06 0309) SpO2:  [93 %-100 %] 95 % (03/06 0509)  Hemodynamic parameters for last 24 hours:    Intake/Output from previous day: 03/05 0701 - 03/06 0700 In: 2505 [P.O.:600; I.V.:1905] Out: 2637 [Urine:2475; Chest Tube:162] Intake/Output this shift: Total I/O In: -  Out: 400 [Urine:400]  General appearance: alert, cooperative and no distress Neurologic: intact Heart: regular rate and rhythm Lungs: clear to auscultation bilaterally Wound: clean and dry no air leak  Lab Results: Recent Labs    09/08/17 0614 09/09/17 0400  WBC 5.7 11.3*  HGB 13.9 12.8*  HCT 42.3 38.4*  PLT 253 251   BMET:  Recent Labs    09/08/17 0614 09/09/17 0400  NA 139 139  K 4.0 3.7  CL 106 104  CO2 23 22  GLUCOSE 101* 129*  BUN 14 7  CREATININE 1.00 1.05  CALCIUM 9.1 8.5*    PT/INR:  Recent Labs    09/08/17 0614  LABPROT 13.1  INR 1.00   ABG    Component Value Date/Time   PHART 7.431 09/04/2017 1434   HCO3 24.5 09/04/2017 1434   TCO2 30 08/25/2016 1834   O2SAT 90.9 09/04/2017 1434   CBG (last 3)  No results for input(s): GLUCAP in the last 72 hours.  Assessment/Plan: S/P Procedure(s) (LRB): VIDEO ASSISTED THORACOSCOPY (VATS)/WEDGE RESECTION (Left) POD # 2  No air leak CXR wasn't ordered- order placed If CXR ok will dc chest tube Continue enoxaparin Continue ambulation Path= hamartoma Dc PCA once CT removed   LOS: 2 days    Loreli SlotSteven C Hendrickson 09/10/2017 Reviewed CXR - no pneumo Dc chest tube  Viviann SpareSteven C. Dorris FetchHendrickson, MD [facility] [phone]

## 2017-09-11 ENCOUNTER — Inpatient Hospital Stay (HOSPITAL_COMMUNITY): Payer: BLUE CROSS/BLUE SHIELD

## 2017-09-11 MED ORDER — BUDESONIDE 0.25 MG/2ML IN SUSP: 0.2500 mg | Freq: Two times a day (BID) | RESPIRATORY_TRACT | 1 refills | Status: DC

## 2017-09-11 MED ORDER — LISINOPRIL 5 MG PO TABS
5.0000 mg | ORAL_TABLET | Freq: Every day | ORAL | Status: DC
  Administered 2017-09-11: 5 mg via ORAL
  Filled 2017-09-11: qty 1

## 2017-09-11 MED ORDER — LISINOPRIL 5 MG PO TABS: 5.0000 mg | ORAL_TABLET | Freq: Every day | ORAL | 1 refills | Status: DC

## 2017-09-11 MED ORDER — ACETAMINOPHEN 500 MG PO TABS: 1000.0000 mg | ORAL_TABLET | Freq: Four times a day (QID) | ORAL | 0 refills | Status: DC

## 2017-09-11 MED ORDER — OXYCODONE HCL 10 MG PO TABS: 10.0000 mg | ORAL_TABLET | Freq: Four times a day (QID) | ORAL | 0 refills | Status: DC | PRN

## 2017-09-11 MED FILL — oxyCODONE HCL 10 MG TABS: 10 | 7 days supply | Qty: 30 | Fill #0

## 2017-09-11 MED FILL — BUDESONIDE 0.25 MG/2ML SUSP: 0.25 | 30 days supply | Qty: 120 | Fill #0

## 2017-09-11 NOTE — Progress Notes (Addendum)
      [address]       [place] [id number]             [phone]      3 Days Post-Op Procedure(s) (LRB): VIDEO ASSISTED THORACOSCOPY (VATS)/WEDGE RESECTION (Left) Subjective: Doing well this morning. Wants to go home.   Objective: Vital signs in last 24 hours: Temp:  [97.9 F (36.6 C)-98.9 F (37.2 C)] 98.9 F (37.2 C) (03/07 0419) Pulse Rate:  [57-71] 71 (03/07 0419) Cardiac Rhythm: Normal sinus rhythm (03/07 0702) Resp:  [13-21] 21 (03/07 0419) BP: (133-189)/(71-89) 152/84 (03/07 0419) SpO2:  [94 %-100 %] 95 % (03/07 0714)     Intake/Output from previous day: 03/06 0701 - 03/07 0700 In: 541.8 [P.O.:240; I.V.:301.8] Out: 2250 [Urine:2250] Intake/Output this shift: No intake/output data recorded.  General appearance: alert, cooperative and no distress Heart: regular rate and rhythm, S1, S2 normal, no murmur, click, rub or gallop Lungs: clear to auscultation bilaterally Abdomen: soft, non-tender; bowel sounds normal; no masses,  no organomegaly Extremities: extremities normal, atraumatic, no cyanosis or edema Wound: clean and dry  Lab Results: Recent Labs    09/09/17 0400  WBC 11.3*  HGB 12.8*  HCT 38.4*  PLT 251   BMET:  Recent Labs    09/09/17 0400  NA 139  K 3.7  CL 104  CO2 22  GLUCOSE 129*  BUN 7  CREATININE 1.05  CALCIUM 8.5*    PT/INR: No results for input(s): LABPROT, INR in the last 72 hours. ABG    Component Value Date/Time   PHART 7.431 09/04/2017 1434   HCO3 24.5 09/04/2017 1434   TCO2 30 08/25/2016 1834   O2SAT 90.9 09/04/2017 1434   CBG (last 3)  No results for input(s): GLUCAP in the last 72 hours.  Assessment/Plan: S/P Procedure(s) (LRB): VIDEO ASSISTED THORACOSCOPY (VATS)/WEDGE RESECTION (Left)  1. CXR this morning shows probable small left apical pneumothorax. Will await official read. Patient is tolerating room air with excellent saturation.  2. NSR in the 80s. BP is climbing. He is on home dose of  Toprol-XL. His creatinine could tolerate an ACEI. I will order Lisinopril.  3.  Renal-creatinine 1.05, electrolytes okay. Making good urine. 4. H and H stable, platelets stable 5. Path=hamartoma 6. Continue Lovenox for DVT proph  Plan: Probably home today.    LOS: 3 days    Sharlene Doryessa N Conte 09/11/2017 Patient seen and examined, agree with above Does have a small left apical pneumo. Asymptomatic DC home today  Salvatore DecentSteven C. Dorris FetchHendrickson, MD [facility] [phone]

## 2017-09-11 NOTE — Care Management Note (Signed)
Case Management Note  Patient Details  Name: Mathew Brady MRN: [id number] Date of Birth: 07/20/1965  Subjective/Objective:   Pt is s/p lobectomy                 Action/Plan:   PTA independent from home with spouse.  DME company choice given to pt - [facility] contacted and referral accepted.  Pt has PCP and denied barriers to obtaining/paying for medications   Expected Discharge Date:  09/11/17               Expected Discharge Plan:  Home/Self Care  In-House Referral:     Discharge planning Services  CM Consult  Post Acute Care Choice:  Durable Medical Equipment Choice offered to:  Patient  DME Arranged:  Nebulizer machine DME Agency:  [facility]  [facility] Agency:     Status of Service:  Completed, signed off  If discussed at Long Length of Stay Meetings, dates discussed:    Additional Comments:  Cherylann ParrClaxton, Chilton Sallade S, RN 09/11/2017, 11:30 AM

## 2017-09-11 NOTE — Progress Notes (Signed)
Discharge note. Patient and husband educated. RN educated on new medications and what medications to stop taking, follow-up appointments, medications and when to take them, information on incision after care, lifting restrictions, driving restrictions, when to call the MD, and when to seek immediate medical attention.

## 2017-09-29 ENCOUNTER — Other Ambulatory Visit: Payer: Self-pay | Admitting: Thoracic Surgery (Cardiothoracic Vascular Surgery)

## 2017-09-29 DIAGNOSIS — R911 Solitary pulmonary nodule: Secondary | ICD-10-CM

## 2017-09-30 ENCOUNTER — Encounter: Payer: Self-pay | Admitting: Thoracic Surgery (Cardiothoracic Vascular Surgery)

## 2017-09-30 ENCOUNTER — Ambulatory Visit (INDEPENDENT_AMBULATORY_CARE_PROVIDER_SITE_OTHER): Payer: Self-pay | Admitting: Thoracic Surgery (Cardiothoracic Vascular Surgery)

## 2017-09-30 ENCOUNTER — Ambulatory Visit
Admission: RE | Admit: 2017-09-30 | Discharge: 2017-09-30 | Disposition: A | Discharge status: Home / Self Care | Payer: BLUE CROSS/BLUE SHIELD | Source: Ambulatory Visit | Attending: Thoracic Surgery (Cardiothoracic Vascular Surgery) | Admitting: Thoracic Surgery (Cardiothoracic Vascular Surgery)

## 2017-09-30 ENCOUNTER — Other Ambulatory Visit: Payer: Self-pay

## 2017-09-30 VITALS — BP 158/101 | HR 97 | Resp 18 | Ht 69.0 in | Wt 196.4 lb

## 2017-09-30 DIAGNOSIS — R911 Solitary pulmonary nodule: Secondary | ICD-10-CM

## 2017-09-30 NOTE — Progress Notes (Signed)
[address]       [place] [id number]             [phone]     HPI: Mathew Brady returns for scheduled follow-up visit  Mathew Brady is a 52 year old gentleman with a history of chronic pain, fibromyalgia, tobacco abuse, anxiety, hypertension, hypertriglyceridemia, and chronic opiate and benzodiazepine use.  He has multiple family members who have died of lung cancer.  In February 2018 he had a fall.  A CT of the chest showed a 1 cm nodule in the lingula.  He wanted to have surgery done but I talked him into following the nodule radiographically.  A PET/CT showed no activity in the lingular nodule.  Several other smaller nodules were too small to characterize.  A repeat CT in February showed minimal interval growth in the lingular nodule.  The other nodules were stable and considered benign.  He was very anxious about the nodule and wished to have it removed even though he understood it was not cancerous.  I did a left VATS and wedge resection on 09/08/2017.  The nodule turned out to be a hamartoma.  His postoperative course was uncomplicated and he went home on day 3.  He says he is having a lot of pain in the area.  He has run out of oxycodone.  He has been prescribed that by Dr. Margo AyeHall.  He is taking the gabapentin.  He says sometimes the pain is so severe he throws up.  He is not having any breathing difficulties. Past Medical History:  Diagnosis Date  . Acid reflux   . Anemia 06/18/2012  . Anxiety   . Arthritis   . Chest pain 06/17/2012   MI ruled out  . Chronic abdominal pain   . Chronic prescription benzodiazepine use   . COPD (chronic obstructive pulmonary disease) (HCC)   . Family history of adverse reaction to anesthesia    trouble when waking with neck surgery.  . Fibromyalgia   . History of kidney stones    last flare up 15 yrs ago  . Hypertension   . Hypertriglyceridemia   . Opiate use   . Palpitations   . Peripheral neuropathy   . Prediabetes  06/18/2012   resolved with weight loss  . Pulmonary nodule   . Rash 06/18/2012   Query eczema  . Vitamin B 12 deficiency     Current Outpatient Medications  Medication Sig Dispense Refill  . acetaminophen (TYLENOL) 500 MG tablet Take 2 tablets (1,000 mg total) by mouth every 6 (six) hours. 30 tablet 0  . albuterol (PROVENTIL HFA;VENTOLIN HFA) 108 (90 Base) MCG/ACT inhaler Inhale 2 puffs into the lungs every 4 (four) hours as needed for wheezing or shortness of breath. 1 Inhaler 6  . ALPRAZolam (XANAX) 0.5 MG tablet Take 0.5 mg by mouth at bedtime.  0  . aspirin EC 81 MG tablet Take 1 tablet (81 mg total) by mouth daily. 90 tablet 3  . budesonide (PULMICORT) 0.25 MG/2ML nebulizer solution Take 2 mLs (0.25 mg total) by nebulization 2 (two) times daily. 120 mL 1  . Fluticasone-Umeclidin-Vilant (TRELEGY ELLIPTA) 100-62.5-25 MCG/INH AEPB Inhale 1 puff into the lungs daily. 1 each 0  . gabapentin (NEURONTIN) 800 MG tablet TAKE 1 TABLET BY MOUTH THREE TIMES A DAY 270 tablet 0  . lisinopril (PRINIVIL,ZESTRIL) 5 MG tablet Take 1 tablet (5 mg total) by mouth daily. 30 tablet 1  . metoprolol succinate (TOPROL-XL) 25  MG 24 hr tablet Take 25 mg by mouth daily.    . Oxycodone HCl 10 MG TABS Take 1 tablet (10 mg total) by mouth every 6 (six) hours as needed. 30 tablet 0   No current facility-administered medications for this visit.     Physical Exam BP (!) 158/101 (BP Location: Left Arm, Patient Position: Sitting, Cuff Size: Large)   Pulse 97   Resp 18   Ht 5\' 9"  (1.753 m)   Wt 196 lb 6.4 oz (89.1 kg)   SpO2 98% Comment: RA  BMI 29.54 kg/m  52 year old man in no acute distress Alert and oriented x3 with no focal deficits Incisions well-healed Lungs clear with equal breath sounds bilaterally Cardiac regular rate and rhythm normal S1 and S2  Diagnostic Tests: CHEST - 2 VIEW  COMPARISON:  09/11/2017, 09/10/2017, 08/11/2017  FINDINGS: Scarring at the lingula. Surgical changes are noted.  Resolution of left apical pneumothorax. 2.5 cm oval opacity in the region of surgical change. Normal heart size.  IMPRESSION: 1. Resolution of left apical pneumothorax 2. Surgical changes at the lingula. Residual 2.5 cm oval opacity in the region of surgical change, best seen on frontal view, probably due to resolving operative change; continued short interval radiographic follow-up suggested.   Electronically Signed   By: Jasmine Pang M.D.   On: 09/30/2017 15:02   Impression: Mathew Brady is a 52 year old gentleman with a history of tobacco abuse who had a lung nodule incidentally found on CT done after he suffered a fall.  This nodule slowly grew over time, but was not FDG avid on PET.  He was advised that we can continue to follow this radiographically, but strongly wish to have it removed.  I did a left VATS and wedge resection about 3 weeks ago.  The nodule turned out to be a hamartoma.  He does not need any specific follow-up related to that.  He is having postoperative pain.  That is not surprising particular given his history.  He has run out of oxycodone.  Because he has been prescribed that in the past by Dr. Margo Aye I think it would be best if he went through him for the prescription to avoid multiple practitioners trying to manage pain issues.  He should not drive until his pain has settled down.  Otherwise there are no restrictions on his activities, but he was advised to build into activities gradually.  Multiple subcentimeter lung nodules-he has multiple bilateral 2-3 mm nodules all of which were felt to be benign by radiology.  Tobacco abuse-he quit smoking about a year ago but will qualify for the low-dose screening CT program once he turns 52 years old.  He should talk with Dr. Margo Aye about that.  Plan: Follow-up with Dr. Margo Aye.  I will be happy to see Mathew Brady back again anytime in the future if I can be of any further assistance with his care.  He knows to call if he has  any issues related to his surgery.  Loreli Slot, MD [facility] [phone]

## 2017-11-05 DIAGNOSIS — Z6829 Body mass index (BMI) 29.0-29.9, adult: Secondary | ICD-10-CM | POA: Diagnosis not present

## 2017-11-05 DIAGNOSIS — J441 Chronic obstructive pulmonary disease with (acute) exacerbation: Secondary | ICD-10-CM | POA: Diagnosis not present

## 2017-11-05 DIAGNOSIS — G8929 Other chronic pain: Secondary | ICD-10-CM | POA: Diagnosis not present

## 2017-11-24 DIAGNOSIS — M545 Low back pain: Secondary | ICD-10-CM | POA: Diagnosis not present

## 2017-11-24 DIAGNOSIS — R911 Solitary pulmonary nodule: Secondary | ICD-10-CM | POA: Diagnosis not present

## 2017-11-24 DIAGNOSIS — I10 Essential (primary) hypertension: Secondary | ICD-10-CM | POA: Diagnosis not present

## 2017-11-24 DIAGNOSIS — F419 Anxiety disorder, unspecified: Secondary | ICD-10-CM | POA: Diagnosis not present

## 2017-11-24 DIAGNOSIS — E782 Mixed hyperlipidemia: Secondary | ICD-10-CM | POA: Diagnosis not present

## 2017-11-24 DIAGNOSIS — M797 Fibromyalgia: Secondary | ICD-10-CM | POA: Diagnosis not present

## 2017-11-24 DIAGNOSIS — G8929 Other chronic pain: Secondary | ICD-10-CM | POA: Diagnosis not present

## 2018-01-02 DIAGNOSIS — G9009 Other idiopathic peripheral autonomic neuropathy: Secondary | ICD-10-CM | POA: Diagnosis not present

## 2018-01-02 DIAGNOSIS — M545 Low back pain: Secondary | ICD-10-CM | POA: Diagnosis not present

## 2018-01-02 DIAGNOSIS — I10 Essential (primary) hypertension: Secondary | ICD-10-CM | POA: Diagnosis not present

## 2018-01-02 DIAGNOSIS — G8929 Other chronic pain: Secondary | ICD-10-CM | POA: Diagnosis not present

## 2018-03-26 ENCOUNTER — Emergency Department (HOSPITAL_COMMUNITY)
Admission: EM | Admit: 2018-03-26 | Discharge: 2018-03-26 | Disposition: A | Discharge status: Home / Self Care | Payer: BLUE CROSS/BLUE SHIELD | Attending: Emergency Medicine | Admitting: Emergency Medicine

## 2018-03-26 ENCOUNTER — Encounter (HOSPITAL_COMMUNITY): Payer: Self-pay

## 2018-03-26 DIAGNOSIS — M5430 Sciatica, unspecified side: Secondary | ICD-10-CM | POA: Diagnosis not present

## 2018-03-26 DIAGNOSIS — F17210 Nicotine dependence, cigarettes, uncomplicated: Secondary | ICD-10-CM | POA: Diagnosis not present

## 2018-03-26 DIAGNOSIS — R7303 Prediabetes: Secondary | ICD-10-CM | POA: Diagnosis not present

## 2018-03-26 DIAGNOSIS — I10 Essential (primary) hypertension: Secondary | ICD-10-CM | POA: Insufficient documentation

## 2018-03-26 DIAGNOSIS — Z79899 Other long term (current) drug therapy: Secondary | ICD-10-CM | POA: Diagnosis not present

## 2018-03-26 DIAGNOSIS — M549 Dorsalgia, unspecified: Secondary | ICD-10-CM | POA: Diagnosis not present

## 2018-03-26 DIAGNOSIS — Z7982 Long term (current) use of aspirin: Secondary | ICD-10-CM | POA: Insufficient documentation

## 2018-03-26 DIAGNOSIS — J449 Chronic obstructive pulmonary disease, unspecified: Secondary | ICD-10-CM | POA: Diagnosis not present

## 2018-03-26 LAB — URINALYSIS, ROUTINE W REFLEX MICROSCOPIC
BILIRUBIN URINE: NEGATIVE
GLUCOSE, UA: NEGATIVE mg/dL
Hgb urine dipstick: NEGATIVE
Ketones, ur: NEGATIVE mg/dL
LEUKOCYTES UA: NEGATIVE
NITRITE: NEGATIVE
PH: 6 (ref 5.0–8.0)
PROTEIN: NEGATIVE mg/dL
SPECIFIC GRAVITY, URINE: 1.01 (ref 1.005–1.030)

## 2018-03-26 MED ORDER — PREDNISONE 20 MG PO TABS: ORAL_TABLET | ORAL | 0 refills | Status: DC

## 2018-03-26 MED ORDER — ACETAMINOPHEN 500 MG PO TABS
1000.0000 mg | ORAL_TABLET | Freq: Four times a day (QID) | ORAL | 0 refills | Status: DC
Start: 2018-03-26 — End: 2023-11-03

## 2018-03-26 MED ORDER — ONDANSETRON HCL 4 MG/2ML IJ SOLN
4.0000 mg | Freq: Once | INTRAMUSCULAR | Status: AC
  Administered 2018-03-26: 4 mg via INTRAVENOUS
  Filled 2018-03-26: qty 2

## 2018-03-26 MED ORDER — DEXAMETHASONE SODIUM PHOSPHATE 10 MG/ML IJ SOLN
10.0000 mg | Freq: Once | INTRAMUSCULAR | Status: AC
  Administered 2018-03-26: 10 mg via INTRAVENOUS
  Filled 2018-03-26: qty 1

## 2018-03-26 MED ORDER — LORAZEPAM 2 MG/ML IJ SOLN
1.0000 mg | Freq: Once | INTRAMUSCULAR | Status: AC
  Administered 2018-03-26: 1 mg via INTRAVENOUS
  Filled 2018-03-26: qty 1

## 2018-03-26 MED ORDER — METHOCARBAMOL 500 MG PO TABS: 1000.0000 mg | ORAL_TABLET | Freq: Three times a day (TID) | ORAL | 0 refills | Status: DC | PRN

## 2018-03-26 MED ORDER — HYDROMORPHONE HCL 2 MG/ML IJ SOLN
2.0000 mg | Freq: Once | INTRAMUSCULAR | Status: AC
  Administered 2018-03-26: 2 mg via INTRAVENOUS
  Filled 2018-03-26: qty 1

## 2018-03-26 MED ORDER — LORAZEPAM 2 MG/ML IJ SOLN
1.0000 mg | Freq: Once | INTRAMUSCULAR | Status: AC
Start: 2018-03-26 — End: 2018-03-26
  Administered 2018-03-26: 1 mg via INTRAVENOUS
  Filled 2018-03-26: qty 1

## 2018-03-26 MED ORDER — HYDROMORPHONE HCL 1 MG/ML IJ SOLN
1.0000 mg | Freq: Once | INTRAMUSCULAR | Status: AC
  Administered 2018-03-26: 1 mg via INTRAVENOUS
  Filled 2018-03-26: qty 1

## 2018-03-26 NOTE — ED Triage Notes (Signed)
Pt presents with c/o back pain and unable to walk. Pt reports hx of back problems. Pt reports he felt a pop when he was getting out of his truck today.

## 2018-03-26 NOTE — ED Provider Notes (Signed)
[facility]-EMERGENCY DEPT Provider Note   CSN: [id number] Arrival date & time: 03/26/18  1120     History   Chief Complaint Chief Complaint  Patient presents with  . Back Pain    HPI Mathew Brady is a 52 y.o. male.  Pt presents to the ED today with right sided back pain.  Pt has pain radiating down to his right buttock.  He said he felt a pop when he was getting out of his truck today.  He has a hx of degenerative disc disease, but has not had problems for while.       Past Medical History:  Diagnosis Date  . Acid reflux   . Anemia 06/18/2012  . Anxiety   . Arthritis   . Chest pain 06/17/2012   MI ruled out  . Chronic abdominal pain   . Chronic prescription benzodiazepine use   . COPD (chronic obstructive pulmonary disease) (HCC)   . Family history of adverse reaction to anesthesia    trouble when waking with neck surgery.  . Fibromyalgia   . History of kidney stones    last flare up 15 yrs ago  . Hypertension   . Hypertriglyceridemia   . Opiate use   . Palpitations   . Peripheral neuropathy   . Prediabetes 06/18/2012   resolved with weight loss  . Pulmonary nodule   . Rash 06/18/2012   Query eczema  . Vitamin B 12 deficiency     Patient Active Problem List   Diagnosis Date Noted  . Lung nodule 09/08/2017  . Cough 03/25/2017  . COPD, severe (HCC) 01/22/2017  . Asbestos exposure 12/13/2016  . Unstable angina (HCC)   . Chest pain 08/25/2016  . Neurological complaint 08/25/2016  . Hyperlipidemia LDL goal <130 12/21/2015  . Neuropathy, peripheral 06/19/2015  . Substance induced mood disorder (HCC) 02/14/2012  . Polysubstance dependence (HCC) 02/12/2012  . Sleep difficulties 02/11/2012  . Anxiety 01/27/2012  . Benign hypertension 01/27/2012  . Opiate dependence (HCC) 01/27/2012  . Benzodiazepine dependence (HCC) 01/27/2012  . Dental caries 01/27/2012  . TOBACCO DEPENDENCE 09/04/2006  . MIGRAINE, UNSPEC., W/O INTRACTABLE MIGRAINE  09/04/2006  . Fibromyalgia 09/04/2006    Past Surgical History:  Procedure Laterality Date  . COLONOSCOPY     found to have 1 polyp  . ESOPHAGEAL DILATION  1993  . LEFT HEART CATH AND CORONARY ANGIOGRAPHY N/A 12/09/2016   Procedure: Left Heart Cath and Coronary Angiography;  Surgeon: Yvonne Kendall, MD;  Location: [facility] INVASIVE CV LAB;  Service: Cardiovascular;  Laterality: N/A;  . TONSILLECTOMY    . VIDEO ASSISTED THORACOSCOPY (VATS)/WEDGE RESECTION Left 09/08/2017   Procedure: VIDEO ASSISTED THORACOSCOPY (VATS)/WEDGE RESECTION;  Surgeon: Loreli Slot, MD;  Location[facility];  Service: Thoracic;  Laterality: Left;        Home Medications    Prior to Admission medications   Medication Sig Start Date End Date Taking? Authorizing Provider  albuterol (PROVENTIL HFA;VENTOLIN HFA) 108 (90 Base) MCG/ACT inhaler Inhale 2 puffs into the lungs every 4 (four) hours as needed for wheezing or shortness of breath. 03/25/17  Yes Roslynn Amble, MD  aspirin EC 81 MG tablet Take 1 tablet (81 mg total) by mouth daily. 11/28/16  Yes Dunn, Dayna N, PA-C  gabapentin (NEURONTIN) 800 MG tablet TAKE 1 TABLET BY MOUTH THREE TIMES A DAY 02/05/17  Yes Dettinger, Elige Radon, MD  lisinopril (PRINIVIL,ZESTRIL) 5 MG tablet Take 1 tablet (5 mg total) by mouth daily. 09/11/17  Yes Gold, Wayne E, PA-C  oxyCODONE-acetaminophen (PERCOCET) 10-325 MG tablet Take 1 tablet by mouth every 6 (six) hours.  03/05/18  Yes [provider]  acetaminophen (TYLENOL) 500 MG tablet Take 2 tablets (1,000 mg total) by mouth every 6 (six) hours. Patient not taking: Reported on 03/26/2018 09/11/17   Rowe Clack, PA-C  ALPRAZolam Prudy Feeler) 0.5 MG tablet Take 0.5 mg by mouth at bedtime as needed for anxiety or sleep.  08/13/17   [provider]  budesonide (PULMICORT) 0.25 MG/2ML nebulizer solution Take 2 mLs (0.25 mg total) by nebulization 2 (two) times daily. Patient taking differently: Take 0.25 mg by nebulization 2 (two)  times daily as needed (sob and wheezing).  09/11/17   Gold, Wayne E, PA-C  Fluticasone-Umeclidin-Vilant (TRELEGY ELLIPTA) 100-62.5-25 MCG/INH AEPB Inhale 1 puff into the lungs daily. Patient taking differently: Inhale 1 puff into the lungs daily as needed (sob and wheezing).  05/05/17   Roslynn Amble, MD  metoprolol succinate (TOPROL-XL) 25 MG 24 hr tablet Take 25 mg by mouth daily as needed (high blood pressure).     [provider]  Oxycodone HCl 10 MG TABS Take 1 tablet (10 mg total) by mouth every 6 (six) hours as needed. Patient not taking: Reported on 03/26/2018 09/11/17   Gershon Crane E, PA-C  pantoprazole (PROTONIX) 40 MG tablet Take 40 mg by mouth daily as needed (indigestion).  03/16/18   [provider]    Family History Family History  Problem Relation Age of Onset  . Fibromyalgia Mother   . Bladder Cancer Father 76  . Benign prostatic hyperplasia Father   . Healthy Son   . Heart failure Paternal Grandmother   . Heart failure Paternal Grandfather   . Lung cancer Paternal Grandfather   . Lung cancer Maternal Uncle   . Emphysema Maternal Uncle   . Lung cancer Paternal Uncle   . Lung cancer Maternal Grandmother   . Stomach cancer Paternal Uncle     Social History Social History   Tobacco Use  . Smoking status: Current Some Day Smoker    Packs/day: 1.50    Years: 35.00    Pack years: 52.50    Types: Cigarettes    Start date: 03/12/1979    Last attempt to quit: 03/09/2015    Years since quitting: 3.0  . Smokeless tobacco: Never Used  . Tobacco comment: Smoked off & on - Peak rate 1.5  Substance Use Topics  . Alcohol use: No    Alcohol/week: 0.0 standard drinks  . Drug use: No     Allergies   Patient has no known allergies.   Review of Systems Review of Systems  Musculoskeletal: Positive for back pain.  All other systems reviewed and are negative.    Physical Exam Updated Vital Signs BP (!) 152/85   Pulse 76   Temp 98.1 F (36.7 C)  (Oral)   Resp 20   Ht 5\' 9"  (1.753 m)   Wt 90.7 kg   SpO2 98%   BMI 29.53 kg/m   Physical Exam  Constitutional: He is oriented to person, place, and time. He appears well-developed and well-nourished.  HENT:  Head: Normocephalic and atraumatic.  Right Ear: External ear normal.  Left Ear: External ear normal.  Nose: Nose normal.  Mouth/Throat: Oropharynx is clear and moist.  Eyes: Pupils are equal, round, and reactive to light. Conjunctivae and EOM are normal.  Neck: Normal range of motion. Neck supple.  Cardiovascular: Normal rate, regular rhythm,  normal heart sounds and intact distal pulses.  Pulmonary/Chest: Effort normal and breath sounds normal.  Abdominal: Soft. Bowel sounds are normal.  Musculoskeletal:       Arms: Neurological: He is alert and oriented to person, place, and time.  Skin: Skin is warm. Capillary refill takes less than 2 seconds.  Psychiatric: He has a normal mood and affect. His behavior is normal. Judgment and thought content normal.  Nursing note and vitals reviewed.    ED Treatments / Results  Labs (all labs ordered are listed, but only abnormal results are displayed) Labs Reviewed  URINALYSIS, ROUTINE W REFLEX MICROSCOPIC    EKG None  Radiology No results found.  Procedures Procedures (including critical care time)  Medications Ordered in ED Medications  HYDROmorphone (DILAUDID) injection 1 mg (1 mg Intravenous Given 03/26/18 1308)  ondansetron (ZOFRAN) injection 4 mg (4 mg Intravenous Given 03/26/18 1308)  LORazepam (ATIVAN) injection 1 mg (1 mg Intravenous Given 03/26/18 1308)  dexamethasone (DECADRON) injection 10 mg (10 mg Intravenous Given 03/26/18 1308)  HYDROmorphone (DILAUDID) injection 2 mg (2 mg Intravenous Given 03/26/18 1548)  LORazepam (ATIVAN) injection 1 mg (1 mg Intravenous Given 03/26/18 1548)     Initial Impression / Assessment and Plan / ED Course  I have reviewed the triage vital signs and the nursing  notes.  Pertinent labs & imaging results that were available during my care of the patient were reviewed by me and considered in my medical decision making (see chart for details).    Sx c/w sciatica.  We will try to get pain control and see if he can walk.  If pain improves and he can walk, then he can go home with f/u with pcp.  Final Clinical Impressions(s) / ED Diagnoses   Final diagnoses:  Acute sciatica    ED Discharge Orders    None       Jacalyn LefevreHaviland, Stanley Helmuth, MD 03/26/18 1553

## 2018-03-26 NOTE — ED Notes (Signed)
Pt was only able to ambulate from his bed to the door with great difficulty. He stated he was in tremendous pain while doing so.

## 2018-03-27 ENCOUNTER — Other Ambulatory Visit (HOSPITAL_COMMUNITY): Payer: Self-pay | Admitting: Internal Medicine

## 2018-03-27 ENCOUNTER — Ambulatory Visit (HOSPITAL_COMMUNITY)
Admission: RE | Admit: 2018-03-27 | Discharge: 2018-03-27 | Disposition: A | Discharge status: Home / Self Care | Payer: BLUE CROSS/BLUE SHIELD | Source: Ambulatory Visit | Attending: Internal Medicine | Admitting: Internal Medicine

## 2018-03-27 DIAGNOSIS — M5126 Other intervertebral disc displacement, lumbar region: Secondary | ICD-10-CM | POA: Insufficient documentation

## 2018-03-27 DIAGNOSIS — M545 Low back pain, unspecified: Secondary | ICD-10-CM

## 2018-03-27 DIAGNOSIS — M5136 Other intervertebral disc degeneration, lumbar region: Secondary | ICD-10-CM | POA: Diagnosis not present

## 2018-03-27 DIAGNOSIS — M79605 Pain in left leg: Secondary | ICD-10-CM

## 2018-03-27 DIAGNOSIS — Z7409 Other reduced mobility: Secondary | ICD-10-CM | POA: Diagnosis not present

## 2018-03-27 DIAGNOSIS — M79604 Pain in right leg: Secondary | ICD-10-CM

## 2018-03-27 DIAGNOSIS — Z6830 Body mass index (BMI) 30.0-30.9, adult: Secondary | ICD-10-CM | POA: Diagnosis not present

## 2018-08-11 IMAGING — NM NM MYOCAR MULTI W/SPECT W/WALL MOTION & EF
2 series · 12 of 12 positions shown · non-contrast
Comparison: none

[Series 1: rest · 6.51mm/px · 6 of 64 frames shown]
[frame 6/64]
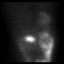
[frame 16/64]
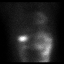
[frame 27/64]
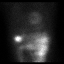
[frame 38/64]
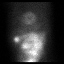
[frame 48/64]
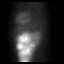
[frame 59/64]
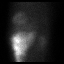

[Series 2: stress gated · 6.51mm/px · 6 of 64 frames shown]
[frame 6/64]
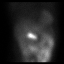
[frame 16/64]
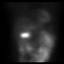
[frame 27/64]
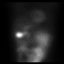
[frame 38/64]
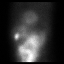
[frame 48/64]
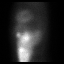
[frame 59/64]
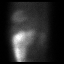

[12 of 12 positions shown; findings below may reference images not displayed]

Canned report from images found in remote index.

Refer to host system for actual result text.

## 2019-02-19 IMAGING — US US CAROTID DUPLEX BILAT
1 series · 13 of 24 positions shown · non-contrast
Comparison: Carotid Doppler ultrasound -01/27/2012

CLINICAL DATA: Carotid artery disease follow-up. History of CAD,
hypertension, visual disturbance, hyperlipidemia, diabetes and
smoking.

EXAM:
BILATERAL CAROTID DUPLEX ULTRASOUND
TECHNIQUE: Gray scale imaging, color Doppler and duplex ultrasound were
performed of bilateral carotid and vertebral arteries in the neck.

[Series 1: us carotid duplex bilat · 0.06mm/px · 13 of 68 slices shown]
[im 1/68]
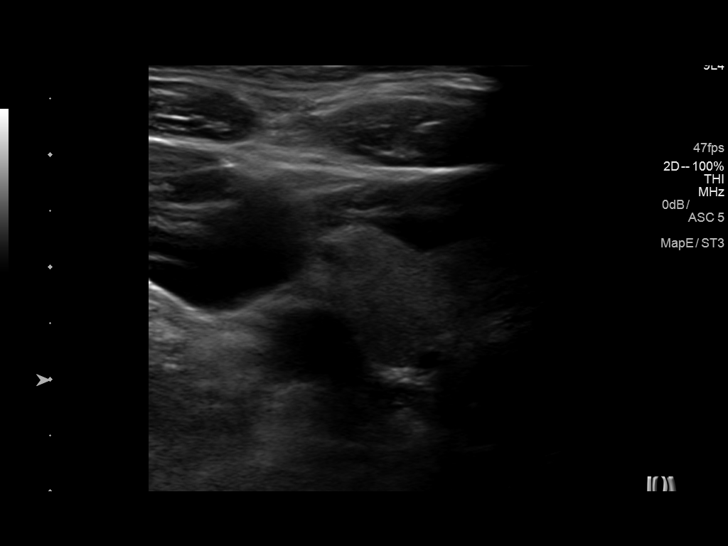
[im 6/68]
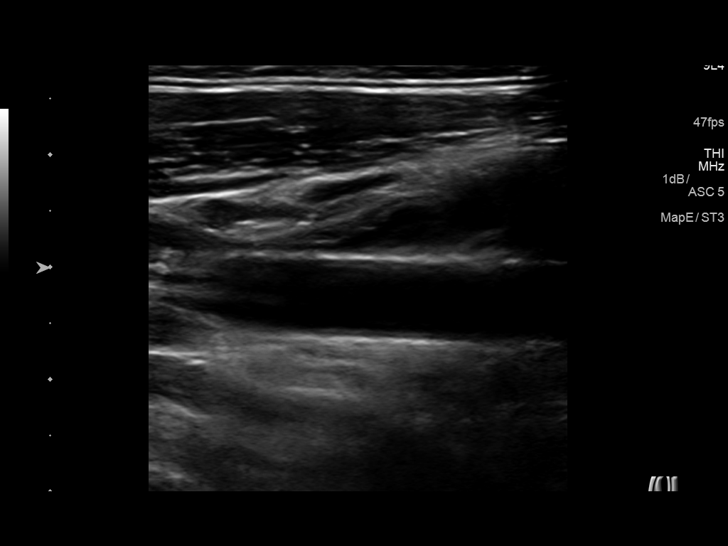
[im 12/68]
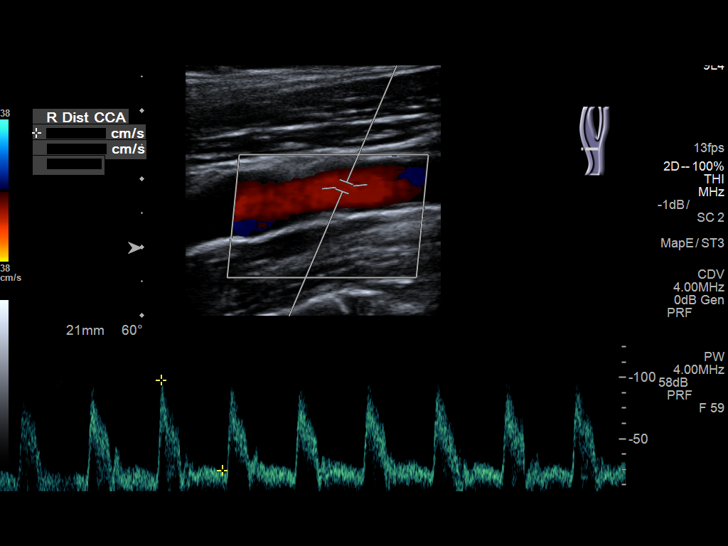
[im 18/68]
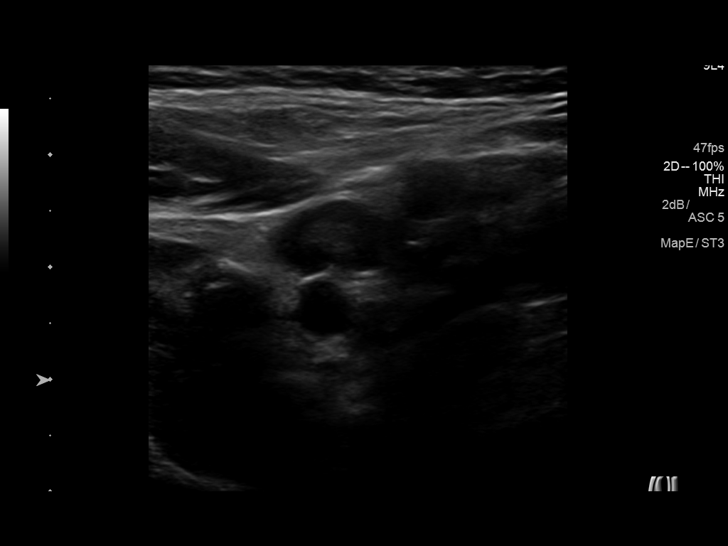
[im 24/68]
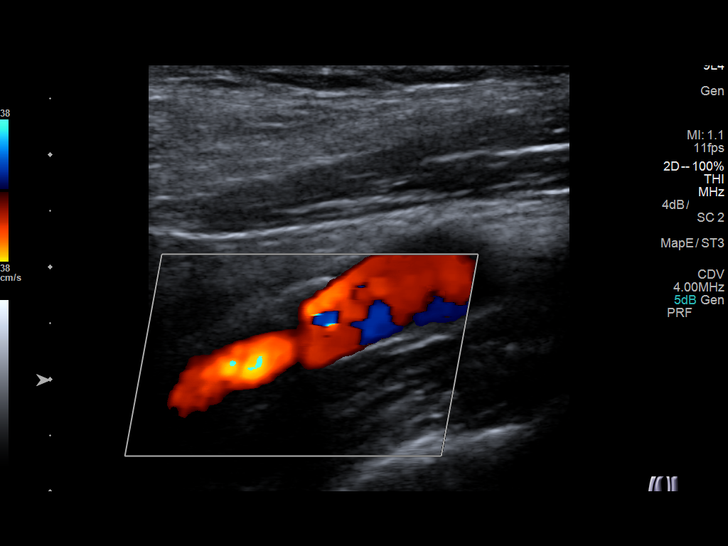
[im 30/68]
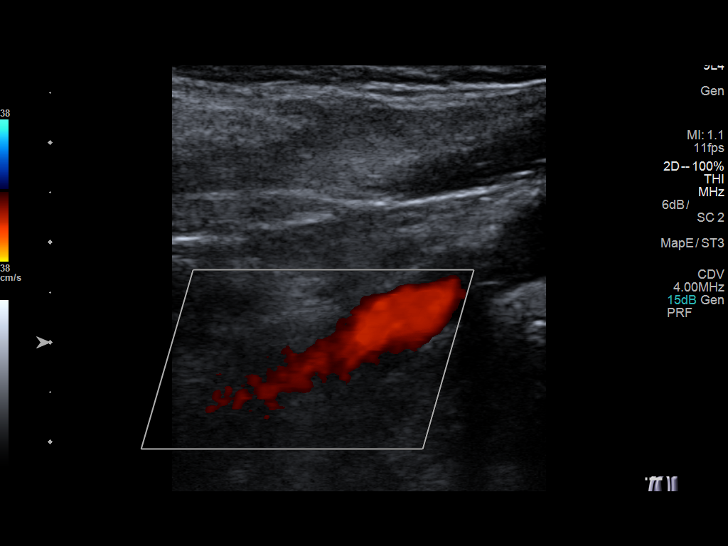
[im 35/68]
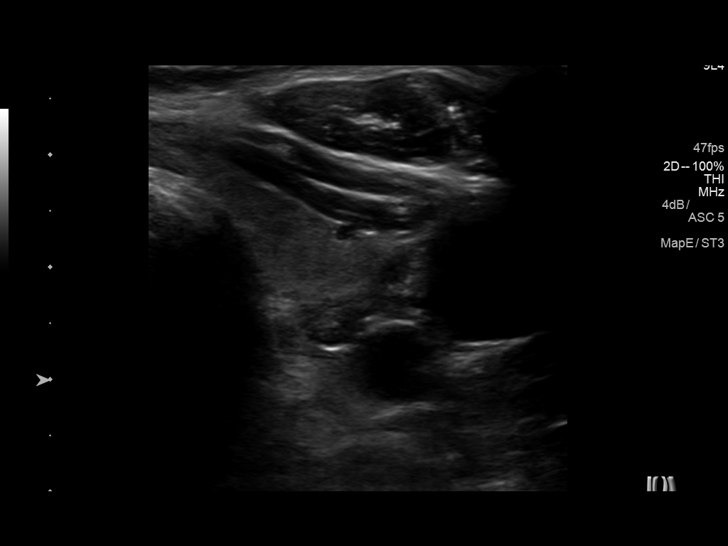
[im 38/68]
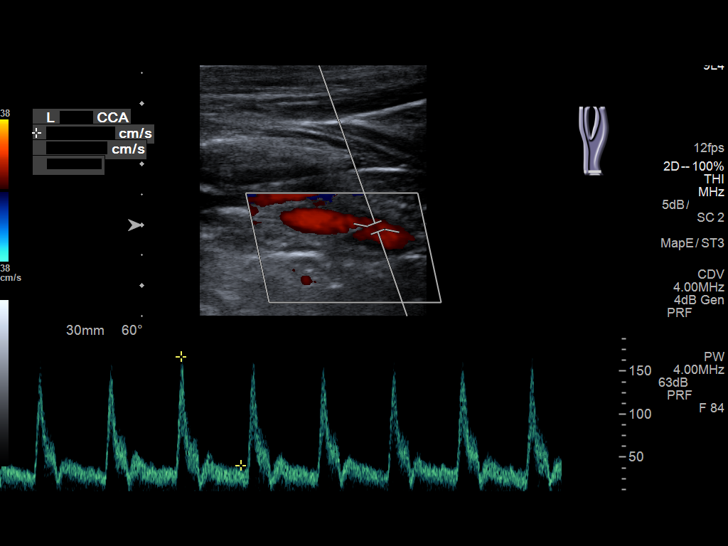
[im 44/68]
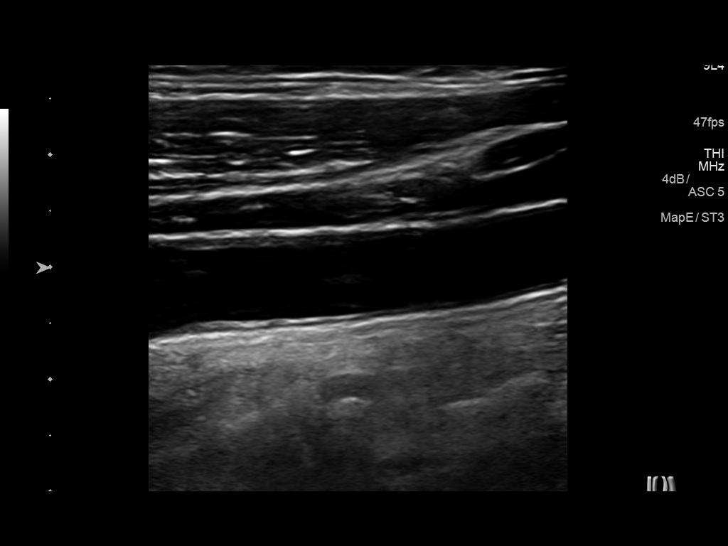
[im 50/68]
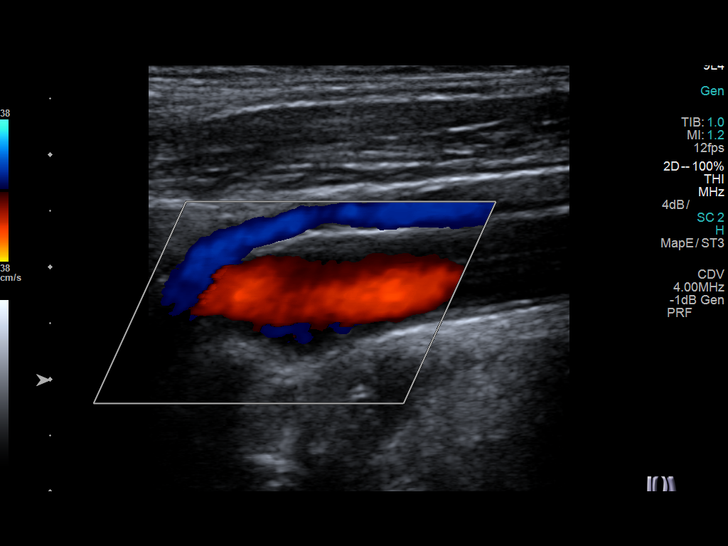
[im 56/68]
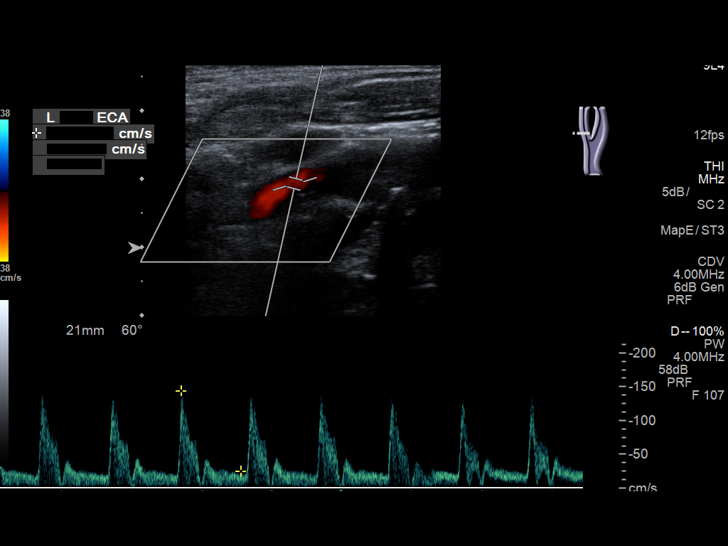
[im 62/68]
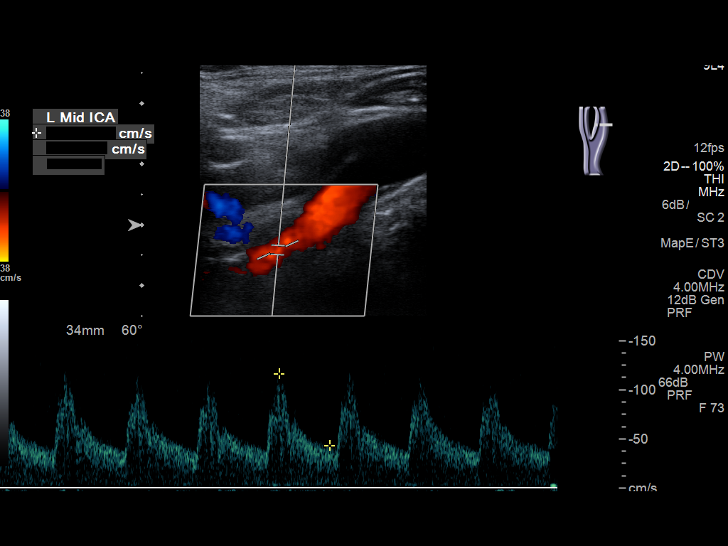
[im 68/68]
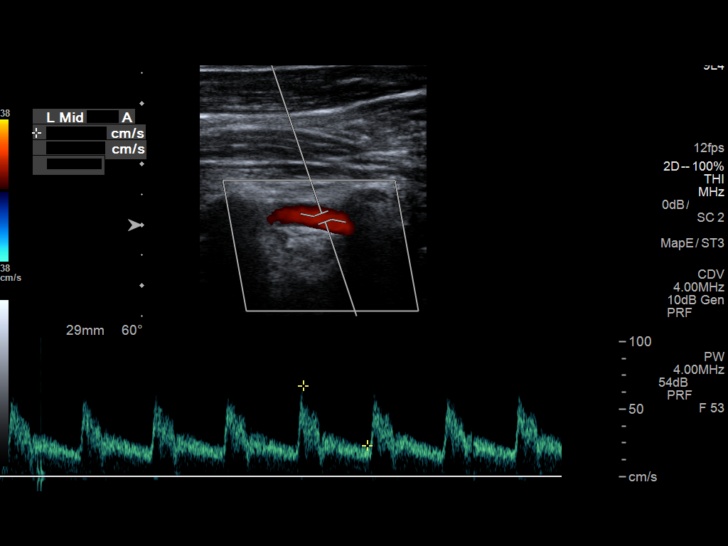

[13 of 24 positions shown; findings below may reference images not displayed]

FINDINGS: Criteria: Quantification of carotid stenosis is based on velocity
parameters that correlate the residual internal carotid diameter
with NASCET-based stenosis levels, using the diameter of the distal
internal carotid lumen as the denominator for stenosis measurement.

The following velocity measurements were obtained:

RIGHT

ICA:  97/37 cm/sec

CCA:  135/28 cm/sec

SYSTOLIC ICA/CCA RATIO:

DIASTOLIC ICA/CCA RATIO:

ECA:  126 cm/sec

LEFT

ICA:  116/43 cm/sec

CCA:  131/34 cm/sec

SYSTOLIC ICA/CCA RATIO:

DIASTOLIC ICA/CCA RATIO:

ECA:  144 cm/sec

RIGHT CAROTID ARTERY: There is a minimal amount of eccentric mixed
echogenic plaque involving the origin and proximal aspects of the
right internal carotid artery (image 24), progressed compared to the
systolic velocities within the interrogated course the right
internal carotid artery to suggest a hemodynamically significant
stenosis.

RIGHT VERTEBRAL ARTERY:  Antegrade flow

LEFT CAROTID ARTERY: There is a minimal amount of intimal thickening
within the left carotid bulb (image 50), extending to involve the
origin and proximal aspect the left internal carotid artery (image
58), minimally progressed compared to the [DATE] examination though
not resulting in elevated peak systolic velocities within the
interrogated course of the left internal carotid artery to suggest a
hemodynamically significant stenosis.

LEFT VERTEBRAL ARTERY:  Antegrade flow
IMPRESSION: Minimal amount of bilateral intimal thickening and atherosclerotic
plaque, left subjectively greater than right, minimally progressed
compared to the [DATE] examination though not resulting in a
hemodynamically significant stenosis within either internal carotid
artery.

## 2019-03-08 ENCOUNTER — Ambulatory Visit: Payer: BLUE CROSS/BLUE SHIELD | Admitting: Cardiology

## 2019-03-28 NOTE — Progress Notes (Deleted)
Cardiology Office Note:    Date:  03/28/2019   ID:  Mathew Brady, DOB Nov 18, 1965, MRN [id number]  PCP:  Celene Squibb, MD  Cardiologist:  No primary care provider on file.  Electrophysiologist:  None   Referring MD: Celene Squibb, MD   No chief complaint on file. ***  History of Present Illness:    Mathew Brady is a 53 y.o. male with a hx of COPD, GERD, hypertension, hyperlipidemia, prediabetes who is referred by Dr. Nevada Crane for an evaluation for palpitations.  Past Medical History:  Diagnosis Date  . Acid reflux   . Anemia 06/18/2012  . Anxiety   . Arthritis   . Chest pain 06/17/2012   MI ruled out  . Chronic abdominal pain   . Chronic prescription benzodiazepine use   . COPD (chronic obstructive pulmonary disease) ([place])   . Family history of adverse reaction to anesthesia    trouble when waking with neck surgery.  . Fibromyalgia   . History of kidney stones    last flare up 15 yrs ago  . Hypertension   . Hypertriglyceridemia   . Opiate use   . Palpitations   . Peripheral neuropathy   . Prediabetes 06/18/2012   resolved with weight loss  . Pulmonary nodule   . Rash 06/18/2012   Query eczema  . Vitamin B 12 deficiency     Past Surgical History:  Procedure Laterality Date  . COLONOSCOPY     found to have 1 polyp  . ESOPHAGEAL DILATION  1993  . LEFT HEART CATH AND CORONARY ANGIOGRAPHY N/A 12/09/2016   Procedure: Left Heart Cath and Coronary Angiography;  Surgeon: Nelva Bush, MD;  Location: [facility];  Service: Cardiovascular;  Laterality: N/A;  . TONSILLECTOMY    . VIDEO ASSISTED THORACOSCOPY (VATS)/WEDGE RESECTION Left 09/08/2017   Procedure: VIDEO ASSISTED THORACOSCOPY (VATS)/WEDGE RESECTION;  Surgeon: Melrose Nakayama, MD;  Location: [facility];  Service: Thoracic;  Laterality: Left;    Current Medications: No outpatient medications have been marked as taking for the 03/30/19 encounter (Appointment) with Donato Heinz, MD.     Allergies:    Patient has no known allergies.   Social History   Socioeconomic History  . Marital status: Married    Spouse name: Not on file  . Number of children: Not on file  . Years of education: Not on file  . Highest education level: Not on file  Occupational History  . Occupation: Database administrator company  Social Needs  . Financial resource strain: Not on file  . Food insecurity    Worry: Not on file    Inability: Not on file  . Transportation needs    Medical: Not on file    Non-medical: Not on file  Tobacco Use  . Smoking status: Current Some Day Smoker    Packs/day: 1.50    Years: 35.00    Pack years: 52.50    Types: Cigarettes    Start date: 03/12/1979    Last attempt to quit: 03/09/2015    Years since quitting: 4.0  . Smokeless tobacco: Never Used  . Tobacco comment: Smoked off & on - Peak rate 1.5  Substance and Sexual Activity  . Alcohol use: No    Alcohol/week: 0.0 standard drinks  . Drug use: No  . Sexual activity: Yes    Partners: Female  Lifestyle  . Physical activity    Days per week: Not on file    Minutes per session: Not  on file  . Stress: Not on file  Relationships  . Social Musicianconnections    Talks on phone: Not on file    Gets together: Not on file    Attends religious service: Not on file    Active member of club or organization: Not on file    Attends meetings of clubs or organizations: Not on file    Relationship status: Not on file  Other Topics Concern  . Not on file  Social History Narrative   Lives with wife in a 2 story home.  Has 5 children all together.  Not working at this time.  Trying to get disability.  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   Education: Some high school.      [facility] (12/13/16):   [place]. Previously has worked Energy managerinstalling underground cable, as a Psychologist, occupationalwelder, Barrister's clerk& lineman. Has had asbestos exposure. Currently has a Nurse, adultCockatoo and chickens. Also has horses, a donkey, dogs, and a cat. No mold exposure. No  hot tub exposure.      Family History: The patient's ***family history includes Benign prostatic hyperplasia in his father; Bladder Cancer (age of onset: 6772) in his father; Emphysema in his maternal uncle; Fibromyalgia in his mother; Healthy in his son; Heart failure in his paternal grandfather and paternal grandmother; Lung cancer in his maternal grandmother, maternal uncle, paternal grandfather, and paternal uncle; Stomach cancer in his paternal uncle.  ROS:   Please see the history of present illness.    *** All other systems reviewed and are negative.  EKGs/Labs/Other Studies Reviewed:    The following studies were reviewed today: ***  EKG:  EKG is *** ordered today.  The ekg ordered today demonstrates ***  Recent Labs: No results found for requested labs within last 8760 hours.  Recent Lipid Panel    Component Value Date/Time   CHOL 192 12/21/2015 1404   TRIG 322 (H) 12/21/2015 1404   HDL 34 (L) 12/21/2015 1404   CHOLHDL 5.6 (H) 12/21/2015 1404   CHOLHDL 7.4 01/27/2012 0535   VLDL UNABLE TO CALCULATE IF TRIGLYCERIDE OVER 400 mg/dL 40/98/119107/22/2013 [id number]   LDLCALC 94 12/21/2015 1404    Physical Exam:    VS:  There were no vitals taken for this visit.    Wt Readings from Last 3 Encounters:  03/26/18 200 lb (90.7 kg)  09/30/17 196 lb 6.4 oz (89.1 kg)  09/08/17 194 lb (88 kg)     GEN: *** Well nourished, well developed in no acute distress HEENT: Normal NECK: No JVD; No carotid bruits LYMPHATICS: No lymphadenopathy CARDIAC: ***RRR, no murmurs, rubs, gallops RESPIRATORY:  Clear to auscultation without rales, wheezing or rhonchi  ABDOMEN: Soft, non-tender, non-distended MUSCULOSKELETAL:  No edema; No deformity  SKIN: Warm and dry NEUROLOGIC:  Alert and oriented x 3 PSYCHIATRIC:  Normal affect   ASSESSMENT:    No diagnosis found. PLAN:    In order of problems listed above:  Palpitations   Medication Adjustments/Labs and Tests Ordered: Current medicines are  reviewed at length with the patient today.  Concerns regarding medicines are outlined above.  No orders of the defined types were placed in this encounter.  No orders of the defined types were placed in this encounter.   There are no Patient Instructions on file for this visit.   Signed, Little Ishikawahristopher L Lilliane Sposito, MD  03/28/2019 8:08 PM    [facility]

## 2019-03-30 ENCOUNTER — Ambulatory Visit: Payer: Medicaid Other | Admitting: Cardiology

## 2019-04-14 ENCOUNTER — Other Ambulatory Visit (HOSPITAL_COMMUNITY): Payer: Self-pay | Admitting: Internal Medicine

## 2019-04-14 ENCOUNTER — Other Ambulatory Visit: Payer: Self-pay | Admitting: Internal Medicine

## 2019-04-14 DIAGNOSIS — N200 Calculus of kidney: Secondary | ICD-10-CM

## 2019-04-15 NOTE — Progress Notes (Deleted)
Cardiology Office Note:    Date:  04/15/2019   ID:  Mathew Brady, DOB May 03, 1966, MRN [id number]  PCP:  Celene Squibb, MD  Cardiologist:  No primary care provider on file.  Electrophysiologist:  None   Referring MD: Celene Squibb, MD   No chief complaint on file. ***  History of Present Illness:    Mathew Brady is a 53 y.o. male with a hx of COPD, tobacco use, GERD, hypertension, hyperlipidemia, fibromyalgia who is referred by Dr. Nevada Crane for an evaluation of palpitations.  Past Medical History:  Diagnosis Date  . Acid reflux   . Anemia 06/18/2012  . Anxiety   . Arthritis   . Chest pain 06/17/2012   MI ruled out  . Chronic abdominal pain   . Chronic prescription benzodiazepine use   . COPD (chronic obstructive pulmonary disease) ([place])   . Family history of adverse reaction to anesthesia    trouble when waking with neck surgery.  . Fibromyalgia   . History of kidney stones    last flare up 15 yrs ago  . Hypertension   . Hypertriglyceridemia   . Opiate use   . Palpitations   . Peripheral neuropathy   . Prediabetes 06/18/2012   resolved with weight loss  . Pulmonary nodule   . Rash 06/18/2012   Query eczema  . Vitamin B 12 deficiency     Past Surgical History:  Procedure Laterality Date  . COLONOSCOPY     found to have 1 polyp  . ESOPHAGEAL DILATION  1993  . LEFT HEART CATH AND CORONARY ANGIOGRAPHY N/A 12/09/2016   Procedure: Left Heart Cath and Coronary Angiography;  Surgeon: Nelva Bush, MD;  Location: [facility];  Service: Cardiovascular;  Laterality: N/A;  . TONSILLECTOMY    . VIDEO ASSISTED THORACOSCOPY (VATS)/WEDGE RESECTION Left 09/08/2017   Procedure: VIDEO ASSISTED THORACOSCOPY (VATS)/WEDGE RESECTION;  Surgeon: Melrose Nakayama, MD;  Location: [facility] OR;  Service: Thoracic;  Laterality: Left;    Current Medications: No outpatient medications have been marked as taking for the 04/19/19 encounter (Appointment) with Donato Heinz, MD.      Allergies:   Patient has no known allergies.   Social History   Socioeconomic History  . Marital status: Married    Spouse name: Not on file  . Number of children: Not on file  . Years of education: Not on file  . Highest education level: Not on file  Occupational History  . Occupation: Database administrator company  Social Needs  . Financial resource strain: Not on file  . Food insecurity    Worry: Not on file    Inability: Not on file  . Transportation needs    Medical: Not on file    Non-medical: Not on file  Tobacco Use  . Smoking status: Current Some Day Smoker    Packs/day: 1.50    Years: 35.00    Pack years: 52.50    Types: Cigarettes    Start date: 03/12/1979    Last attempt to quit: 03/09/2015    Years since quitting: 4.1  . Smokeless tobacco: Never Used  . Tobacco comment: Smoked off & on - Peak rate 1.5  Substance and Sexual Activity  . Alcohol use: No    Alcohol/week: 0.0 standard drinks  . Drug use: No  . Sexual activity: Yes    Partners: Female  Lifestyle  . Physical activity    Days per week: Not on file    Minutes per  session: Not on file  . Stress: Not on file  Relationships  . Social Musician on phone: Not on file    Gets together: Not on file    Attends religious service: Not on file    Active member of club or organization: Not on file    Attends meetings of clubs or organizations: Not on file    Relationship status: Not on file  Other Topics Concern  . Not on file  Social History Narrative   Lives with wife in a 2 story home.  Has 5 children all together.  Not working at this time.  Trying to get disability.  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   Education: Some high school.      [facility] (12/13/16):   Originally from [facility] [place] [facility]. 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 Currently has a Nurse, adult. Also has horses, a donkey, dogs, and a cat. No mold  exposure. No hot tub exposure.      Family History: The patient's ***family history includes Benign prostatic hyperplasia in his father; Bladder Cancer (age of onset: 21) in his father; Emphysema in his maternal uncle; Fibromyalgia in his mother; Healthy in his son; Heart failure in his paternal grandfather and paternal grandmother; Lung cancer in his maternal grandmother, maternal uncle, paternal grandfather, and paternal uncle; Stomach cancer in his paternal uncle.  ROS:   Please see the history of present illness.    *** All other systems reviewed and are negative.  EKGs/Labs/Other Studies Reviewed:    The following studies were reviewed today: ***  EKG:  EKG is *** ordered today.  The ekg ordered today demonstrates ***  Recent Labs: No results found for requested labs within last 8760 hours.  Recent Lipid Panel    Component Value Date/Time   CHOL 192 12/21/2015 1404   TRIG 322 (H) 12/21/2015 1404   HDL 34 (L) 12/21/2015 1404   CHOLHDL 5.6 (H) 12/21/2015 1404   CHOLHDL 7.4 01/27/2012 0535   VLDL UNABLE TO CALCULATE IF TRIGLYCERIDE OVER 400 mg/dL 16/94/5038 8828   LDLCALC 94 12/21/2015 1404    Physical Exam:    VS:  There were no vitals taken for this visit.    Wt Readings from Last 3 Encounters:  03/26/18 200 lb (90.7 kg)  09/30/17 196 lb 6.4 oz (89.1 kg)  09/08/17 194 lb (88 kg)     GEN: *** Well nourished, well developed in no acute distress HEENT: Normal NECK: No JVD; No carotid bruits LYMPHATICS: No lymphadenopathy CARDIAC: ***RRR, no murmurs, rubs, gallops RESPIRATORY:  Clear to auscultation without rales, wheezing or rhonchi  ABDOMEN: Soft, non-tender, non-distended MUSCULOSKELETAL:  No edema; No deformity  SKIN: Warm and dry NEUROLOGIC:  Alert and oriented x 3 PSYCHIATRIC:  Normal affect   ASSESSMENT:    No diagnosis found. PLAN:    In order of problems listed above:  Palpitations:  Hyperlipidemia: On atorvastatin 10 mg daily   Medication  Adjustments/Labs and Tests Ordered: Current medicines are reviewed at length with the patient today.  Concerns regarding medicines are outlined above.  No orders of the defined types were placed in this encounter.  No orders of the defined types were placed in this encounter.   There are no Patient Instructions on file for this visit.   Signed, Little Ishikawa, MD  04/15/2019 9:37 PM    [facility]

## 2019-04-19 ENCOUNTER — Ambulatory Visit: Payer: Medicaid Other | Admitting: Cardiology

## 2019-04-30 ENCOUNTER — Ambulatory Visit (HOSPITAL_COMMUNITY): Payer: Medicaid Other

## 2019-05-04 ENCOUNTER — Ambulatory Visit (HOSPITAL_COMMUNITY): Payer: Medicaid Other

## 2019-05-18 ENCOUNTER — Ambulatory Visit (HOSPITAL_COMMUNITY)
Admission: RE | Admit: 2019-05-18 | Discharge: 2019-05-18 | Disposition: A | Discharge status: Home / Self Care | Payer: Medicaid Other | Source: Ambulatory Visit | Attending: Internal Medicine | Admitting: Internal Medicine

## 2019-05-18 ENCOUNTER — Other Ambulatory Visit: Payer: Self-pay

## 2019-05-18 DIAGNOSIS — N200 Calculus of kidney: Secondary | ICD-10-CM | POA: Diagnosis not present

## 2020-01-06 DIAGNOSIS — G894 Chronic pain syndrome: Secondary | ICD-10-CM | POA: Diagnosis not present

## 2020-01-06 DIAGNOSIS — I10 Essential (primary) hypertension: Secondary | ICD-10-CM | POA: Diagnosis not present

## 2020-01-06 DIAGNOSIS — M5440 Lumbago with sciatica, unspecified side: Secondary | ICD-10-CM | POA: Diagnosis not present

## 2020-03-07 DIAGNOSIS — R911 Solitary pulmonary nodule: Secondary | ICD-10-CM | POA: Diagnosis not present

## 2020-03-07 DIAGNOSIS — Z136 Encounter for screening for cardiovascular disorders: Secondary | ICD-10-CM | POA: Diagnosis not present

## 2020-03-07 DIAGNOSIS — Z7409 Other reduced mobility: Secondary | ICD-10-CM | POA: Diagnosis not present

## 2020-03-07 DIAGNOSIS — I499 Cardiac arrhythmia, unspecified: Secondary | ICD-10-CM | POA: Diagnosis not present

## 2020-03-07 DIAGNOSIS — M25511 Pain in right shoulder: Secondary | ICD-10-CM | POA: Diagnosis not present

## 2020-03-07 DIAGNOSIS — F17218 Nicotine dependence, cigarettes, with other nicotine-induced disorders: Secondary | ICD-10-CM | POA: Diagnosis not present

## 2020-03-07 DIAGNOSIS — I10 Essential (primary) hypertension: Secondary | ICD-10-CM | POA: Diagnosis not present

## 2020-03-07 DIAGNOSIS — G894 Chronic pain syndrome: Secondary | ICD-10-CM | POA: Diagnosis not present

## 2020-05-08 ENCOUNTER — Other Ambulatory Visit: Payer: Self-pay

## 2020-05-08 ENCOUNTER — Encounter: Payer: Self-pay | Admitting: Cardiology

## 2020-05-08 ENCOUNTER — Ambulatory Visit: Payer: Medicaid Other | Admitting: Cardiology

## 2020-05-08 DIAGNOSIS — M5440 Lumbago with sciatica, unspecified side: Secondary | ICD-10-CM | POA: Diagnosis not present

## 2020-05-08 DIAGNOSIS — G894 Chronic pain syndrome: Secondary | ICD-10-CM | POA: Diagnosis not present

## 2020-05-08 DIAGNOSIS — Z23 Encounter for immunization: Secondary | ICD-10-CM | POA: Diagnosis not present

## 2020-05-08 DIAGNOSIS — I10 Essential (primary) hypertension: Secondary | ICD-10-CM | POA: Diagnosis not present

## 2020-05-08 NOTE — Progress Notes (Deleted)
Cardiology Office Note  Date: 05/08/2020   ID: Mathew Brady, DOB Oct 11, 1965, MRN [id number]  PCP:  Benita Stabile, MD  Cardiologist:  No primary care provider on file. Electrophysiologist:  None   No chief complaint on file.   History of Present Illness: Mathew Brady is a 54 y.o. male referred for cardiology consultation by Dr. Margo Aye for the evaluation of palpitations and chest discomfort.  Patient was seen by our practice in the past, visit noted with Ms. Jetta Lout in May 2018.  He described a sense of palpitations and also chest discomfort at that time as well.  Lexiscan Myoview indicated septal distribution scar with moderate peri-infarct ischemia, LVEF normal.  Cardiac catheterization however demonstrated only mild to moderate nonobstructive CAD involving the second diagonal and proximal RCA with no need for revascularization.  Limited cardiac monitor did not demonstrate any significant arrhythmias.  Past Medical History:  Diagnosis Date  . Anemia 06/18/2012  . Anxiety   . Arthritis   . CAD (coronary artery disease)    Mild to moderate nonobstructive CAD at cardiac catheterization 2018  . COPD (chronic obstructive pulmonary disease) ([facility])   . Essential hypertension   . Fibromyalgia   . GERD (gastroesophageal reflux disease)   . History of kidney stones   . Opiate use   . Palpitations   . Peripheral neuropathy   . Pulmonary nodule   . Vitamin B 12 deficiency     Past Surgical History:  Procedure Laterality Date  . COLONOSCOPY     found to have 1 polyp  . ESOPHAGEAL DILATION  1993  . LEFT HEART CATH AND CORONARY ANGIOGRAPHY N/A 12/09/2016   Procedure: Left Heart Cath and Coronary Angiography;  Surgeon: Yvonne Kendall, MD;  Location: [facility];  Service: Cardiovascular;  Laterality: N/A;  . TONSILLECTOMY    . VIDEO ASSISTED THORACOSCOPY (VATS)/WEDGE RESECTION Left 09/08/2017   Procedure: VIDEO ASSISTED THORACOSCOPY (VATS)/WEDGE RESECTION;  Surgeon: Loreli Slot, MD;  Location: [facility] OR;  Service: Thoracic;  Laterality: Left;    Current Outpatient Medications  Medication Sig Dispense Refill  . acetaminophen (TYLENOL) 500 MG tablet Take 2 tablets (1,000 mg total) by mouth every 6 (six) hours. 30 tablet 0  . albuterol (PROVENTIL HFA;VENTOLIN HFA) 108 (90 Base) MCG/ACT inhaler Inhale 2 puffs into the lungs every 4 (four) hours as needed for wheezing or shortness of breath. 1 Inhaler 6  . ALPRAZolam (XANAX) 0.5 MG tablet Take 0.5 mg by mouth at bedtime as needed for anxiety or sleep.   0  . aspirin EC 81 MG tablet Take 1 tablet (81 mg total) by mouth daily. 90 tablet 3  . budesonide (PULMICORT) 0.25 MG/2ML nebulizer solution Take 2 mLs (0.25 mg total) by nebulization 2 (two) times daily. (Patient taking differently: Take 0.25 mg by nebulization 2 (two) times daily as needed (sob and wheezing). ) 120 mL 1  . Fluticasone-Umeclidin-Vilant (TRELEGY ELLIPTA) 100-62.5-25 MCG/INH AEPB Inhale 1 puff into the lungs daily. (Patient taking differently: Inhale 1 puff into the lungs daily as needed (sob and wheezing). ) 1 each 0  . gabapentin (NEURONTIN) 800 MG tablet TAKE 1 TABLET BY MOUTH THREE TIMES A DAY 270 tablet 0  . lisinopril (PRINIVIL,ZESTRIL) 5 MG tablet Take 1 tablet (5 mg total) by mouth daily. 30 tablet 1  . methocarbamol (ROBAXIN) 500 MG tablet Take 2 tablets (1,000 mg total) by mouth every 8 (eight) hours as needed for muscle spasms. 30 tablet 0  .  metoprolol succinate (TOPROL-XL) 25 MG 24 hr tablet Take 25 mg by mouth daily as needed (high blood pressure).     . Oxycodone HCl 10 MG TABS Take 1 tablet (10 mg total) by mouth every 6 (six) hours as needed. (Patient not taking: Reported on 03/26/2018) 30 tablet 0  . oxyCODONE-acetaminophen (PERCOCET) 10-325 MG tablet Take 1 tablet by mouth every 6 (six) hours.     . pantoprazole (PROTONIX) 40 MG tablet Take 40 mg by mouth daily as needed (indigestion).     . predniSONE (DELTASONE) 20 MG tablet 3 tabs po  day one, then 2 po daily x 4 days 11 tablet 0   No current facility-administered medications for this visit.   Allergies:  Patient has no known allergies.   Social History: The patient  reports that he has been smoking cigarettes. He started smoking about 41 years ago. He has a 52.50 pack-year smoking history. He has never used smokeless tobacco. He reports that he does not drink alcohol and does not use drugs.   Family History: The patient's family history includes Benign prostatic hyperplasia in his father; Bladder Cancer (age of onset: 92) in his father; Emphysema in his maternal uncle; Fibromyalgia in his mother; Healthy in his son; Heart failure in his paternal grandfather and paternal grandmother; Lung cancer in his maternal grandmother, maternal uncle, paternal grandfather, and paternal uncle; Stomach cancer in his paternal uncle.   ROS:  Please see the history of present illness. Otherwise, complete review of systems is positive for {NONE DEFAULTED:18576::"none"}.  All other systems are reviewed and negative.   Physical Exam: VS:  There were no vitals taken for this visit., BMI There is no height or weight on file to calculate BMI.  Wt Readings from Last 3 Encounters:  03/26/18 200 lb (90.7 kg)  09/30/17 196 lb 6.4 oz (89.1 kg)  09/08/17 194 lb (88 kg)    General: Patient appears comfortable at rest. HEENT: Conjunctiva and lids normal, oropharynx clear with moist mucosa. Neck: Supple, no elevated JVP or carotid bruits, no thyromegaly. Lungs: Clear to auscultation, nonlabored breathing at rest. Cardiac: Regular rate and rhythm, no S3 or significant systolic murmur, no pericardial rub. Abdomen: Soft, nontender, no hepatomegaly, bowel sounds present, no guarding or rebound. Extremities: No pitting edema, distal pulses 2+. Skin: Warm and dry. Musculoskeletal: No kyphosis. Neuropsychiatric: Alert and oriented x3, affect grossly appropriate.  ECG:  An ECG dated 09/04/2017 was  personally reviewed today and demonstrated:  Sinus rhythm with leftward axis.  Recent Labwork:  August 2021: Hemoglobin 16.3, platelets 332, BUN 5, creatinine 0.88, potassium 4.4, AST 17, ALT 17, TSH 2.07, troponin T normal at 6, total CK 112  Other Studies Reviewed Today:  Cardiac catheterization 12/09/2016: Conclusions: 1. Mild to moderate, non-obstructive coronary artery disease involving second diagonal branch and proximal RCA. No significant disease to explain patient's chest pain. 2. Upper normal left ventricular filling pressure. 3. Normal to hyperdynamic left ventricular contraction.  Cardiac monitor 12/06/2016:  Data available for only 28% of planned monitored time.  Min HR 45, Max HR 118, Avg HR 66  Reported symptoms correlate with normal sinus rhythm  No significant arrhythmias  Assessment and Plan:    Medication Adjustments/Labs and Tests Ordered: Current medicines are reviewed at length with the patient today.  Concerns regarding medicines are outlined above.   Tests Ordered: No orders of the defined types were placed in this encounter.   Medication Changes: No orders of the defined types were placed in  this encounter.   Disposition:  Follow up {follow up:15908}  Signed, Jonelle Sidle, MD, [facility] 05/08/2020 8:48 AM    [facility] [address] [place], [place], [place] [address] Phone: [phone]; Fax: [phone]

## 2020-08-07 DIAGNOSIS — G9009 Other idiopathic peripheral autonomic neuropathy: Secondary | ICD-10-CM | POA: Diagnosis not present

## 2020-08-07 DIAGNOSIS — J441 Chronic obstructive pulmonary disease with (acute) exacerbation: Secondary | ICD-10-CM | POA: Diagnosis not present

## 2020-08-07 DIAGNOSIS — I10 Essential (primary) hypertension: Secondary | ICD-10-CM | POA: Diagnosis not present

## 2020-08-07 DIAGNOSIS — E782 Mixed hyperlipidemia: Secondary | ICD-10-CM | POA: Diagnosis not present

## 2020-08-08 DIAGNOSIS — F17218 Nicotine dependence, cigarettes, with other nicotine-induced disorders: Secondary | ICD-10-CM | POA: Diagnosis not present

## 2020-08-08 DIAGNOSIS — Z7409 Other reduced mobility: Secondary | ICD-10-CM | POA: Diagnosis not present

## 2020-08-08 DIAGNOSIS — E782 Mixed hyperlipidemia: Secondary | ICD-10-CM | POA: Diagnosis not present

## 2020-08-08 DIAGNOSIS — N529 Male erectile dysfunction, unspecified: Secondary | ICD-10-CM | POA: Diagnosis not present

## 2020-08-08 DIAGNOSIS — M25511 Pain in right shoulder: Secondary | ICD-10-CM | POA: Diagnosis not present

## 2020-08-08 DIAGNOSIS — R911 Solitary pulmonary nodule: Secondary | ICD-10-CM | POA: Diagnosis not present

## 2020-08-08 DIAGNOSIS — J069 Acute upper respiratory infection, unspecified: Secondary | ICD-10-CM | POA: Diagnosis not present

## 2020-08-18 DIAGNOSIS — I10 Essential (primary) hypertension: Secondary | ICD-10-CM | POA: Diagnosis not present

## 2020-08-18 DIAGNOSIS — E782 Mixed hyperlipidemia: Secondary | ICD-10-CM | POA: Diagnosis not present

## 2020-08-18 DIAGNOSIS — G9009 Other idiopathic peripheral autonomic neuropathy: Secondary | ICD-10-CM | POA: Diagnosis not present

## 2020-08-18 DIAGNOSIS — J441 Chronic obstructive pulmonary disease with (acute) exacerbation: Secondary | ICD-10-CM | POA: Diagnosis not present

## 2020-08-23 DIAGNOSIS — J440 Chronic obstructive pulmonary disease with acute lower respiratory infection: Secondary | ICD-10-CM | POA: Diagnosis not present

## 2020-08-23 DIAGNOSIS — I10 Essential (primary) hypertension: Secondary | ICD-10-CM | POA: Diagnosis not present

## 2020-08-23 DIAGNOSIS — G609 Hereditary and idiopathic neuropathy, unspecified: Secondary | ICD-10-CM | POA: Diagnosis not present

## 2020-08-23 DIAGNOSIS — G894 Chronic pain syndrome: Secondary | ICD-10-CM | POA: Diagnosis not present

## 2020-09-04 DIAGNOSIS — S81809A Unspecified open wound, unspecified lower leg, initial encounter: Secondary | ICD-10-CM | POA: Diagnosis not present

## 2020-09-04 DIAGNOSIS — E782 Mixed hyperlipidemia: Secondary | ICD-10-CM | POA: Diagnosis not present

## 2020-09-11 DIAGNOSIS — S81809A Unspecified open wound, unspecified lower leg, initial encounter: Secondary | ICD-10-CM | POA: Diagnosis not present

## 2020-09-11 DIAGNOSIS — E782 Mixed hyperlipidemia: Secondary | ICD-10-CM | POA: Diagnosis not present

## 2020-10-04 DIAGNOSIS — F17218 Nicotine dependence, cigarettes, with other nicotine-induced disorders: Secondary | ICD-10-CM | POA: Diagnosis not present

## 2020-10-04 DIAGNOSIS — R042 Hemoptysis: Secondary | ICD-10-CM | POA: Diagnosis not present

## 2020-10-04 DIAGNOSIS — I10 Essential (primary) hypertension: Secondary | ICD-10-CM | POA: Diagnosis not present

## 2020-10-04 DIAGNOSIS — S81809A Unspecified open wound, unspecified lower leg, initial encounter: Secondary | ICD-10-CM | POA: Diagnosis not present

## 2020-10-17 DIAGNOSIS — I10 Essential (primary) hypertension: Secondary | ICD-10-CM | POA: Diagnosis not present

## 2020-10-17 DIAGNOSIS — R042 Hemoptysis: Secondary | ICD-10-CM | POA: Diagnosis not present

## 2020-10-17 DIAGNOSIS — F17218 Nicotine dependence, cigarettes, with other nicotine-induced disorders: Secondary | ICD-10-CM | POA: Diagnosis not present

## 2020-10-17 DIAGNOSIS — S81809A Unspecified open wound, unspecified lower leg, initial encounter: Secondary | ICD-10-CM | POA: Diagnosis not present

## 2020-11-07 DIAGNOSIS — Z125 Encounter for screening for malignant neoplasm of prostate: Secondary | ICD-10-CM | POA: Diagnosis not present

## 2020-11-07 DIAGNOSIS — E875 Hyperkalemia: Secondary | ICD-10-CM | POA: Diagnosis not present

## 2020-11-07 DIAGNOSIS — Z79891 Long term (current) use of opiate analgesic: Secondary | ICD-10-CM | POA: Diagnosis not present

## 2020-11-07 DIAGNOSIS — I10 Essential (primary) hypertension: Secondary | ICD-10-CM | POA: Diagnosis not present

## 2020-11-07 DIAGNOSIS — M797 Fibromyalgia: Secondary | ICD-10-CM | POA: Diagnosis not present

## 2020-11-07 DIAGNOSIS — R7301 Impaired fasting glucose: Secondary | ICD-10-CM | POA: Diagnosis not present

## 2020-11-07 DIAGNOSIS — E782 Mixed hyperlipidemia: Secondary | ICD-10-CM | POA: Diagnosis not present

## 2020-11-07 DIAGNOSIS — R7303 Prediabetes: Secondary | ICD-10-CM | POA: Diagnosis not present

## 2020-11-09 DIAGNOSIS — F411 Generalized anxiety disorder: Secondary | ICD-10-CM | POA: Diagnosis not present

## 2020-11-09 DIAGNOSIS — G609 Hereditary and idiopathic neuropathy, unspecified: Secondary | ICD-10-CM | POA: Diagnosis not present

## 2020-11-09 DIAGNOSIS — I10 Essential (primary) hypertension: Secondary | ICD-10-CM | POA: Diagnosis not present

## 2020-11-09 DIAGNOSIS — J449 Chronic obstructive pulmonary disease, unspecified: Secondary | ICD-10-CM | POA: Diagnosis not present

## 2020-11-21 DIAGNOSIS — M545 Low back pain, unspecified: Secondary | ICD-10-CM | POA: Diagnosis not present

## 2020-11-30 ENCOUNTER — Other Ambulatory Visit (HOSPITAL_COMMUNITY): Payer: Self-pay | Admitting: Family Medicine

## 2020-11-30 ENCOUNTER — Other Ambulatory Visit: Payer: Self-pay

## 2020-11-30 ENCOUNTER — Ambulatory Visit (HOSPITAL_COMMUNITY)
Admission: RE | Admit: 2020-11-30 | Discharge: 2020-11-30 | Disposition: A | Discharge status: Home / Self Care | Payer: Medicaid Other | Source: Ambulatory Visit | Attending: Family Medicine | Admitting: Family Medicine

## 2020-11-30 DIAGNOSIS — M79671 Pain in right foot: Secondary | ICD-10-CM

## 2020-12-13 DIAGNOSIS — M79671 Pain in right foot: Secondary | ICD-10-CM | POA: Diagnosis not present

## 2020-12-13 DIAGNOSIS — F411 Generalized anxiety disorder: Secondary | ICD-10-CM | POA: Diagnosis not present

## 2020-12-13 DIAGNOSIS — G894 Chronic pain syndrome: Secondary | ICD-10-CM | POA: Diagnosis not present

## 2020-12-21 DIAGNOSIS — G894 Chronic pain syndrome: Secondary | ICD-10-CM | POA: Diagnosis not present

## 2020-12-21 DIAGNOSIS — S92901D Unspecified fracture of right foot, subsequent encounter for fracture with routine healing: Secondary | ICD-10-CM | POA: Diagnosis not present

## 2021-03-02 ENCOUNTER — Other Ambulatory Visit (HOSPITAL_COMMUNITY): Payer: Self-pay | Admitting: Internal Medicine

## 2021-03-02 ENCOUNTER — Other Ambulatory Visit: Payer: Self-pay | Admitting: Internal Medicine

## 2021-03-02 DIAGNOSIS — J449 Chronic obstructive pulmonary disease, unspecified: Secondary | ICD-10-CM | POA: Diagnosis not present

## 2021-03-02 DIAGNOSIS — R1031 Right lower quadrant pain: Secondary | ICD-10-CM

## 2021-03-02 DIAGNOSIS — G609 Hereditary and idiopathic neuropathy, unspecified: Secondary | ICD-10-CM | POA: Diagnosis not present

## 2021-03-02 DIAGNOSIS — G894 Chronic pain syndrome: Secondary | ICD-10-CM | POA: Diagnosis not present

## 2021-03-02 DIAGNOSIS — M5126 Other intervertebral disc displacement, lumbar region: Secondary | ICD-10-CM | POA: Diagnosis not present

## 2021-03-02 DIAGNOSIS — N50812 Left testicular pain: Secondary | ICD-10-CM | POA: Diagnosis not present

## 2021-03-09 ENCOUNTER — Ambulatory Visit (HOSPITAL_COMMUNITY): Payer: Medicaid Other

## 2021-03-09 ENCOUNTER — Encounter (HOSPITAL_COMMUNITY): Payer: Self-pay

## 2021-03-30 DIAGNOSIS — R1031 Right lower quadrant pain: Secondary | ICD-10-CM | POA: Diagnosis not present

## 2021-03-30 DIAGNOSIS — G8929 Other chronic pain: Secondary | ICD-10-CM | POA: Diagnosis not present

## 2021-04-04 ENCOUNTER — Encounter: Payer: Self-pay | Admitting: Internal Medicine

## 2021-05-10 DIAGNOSIS — J441 Chronic obstructive pulmonary disease with (acute) exacerbation: Secondary | ICD-10-CM | POA: Diagnosis not present

## 2021-05-29 DIAGNOSIS — M545 Low back pain, unspecified: Secondary | ICD-10-CM | POA: Diagnosis not present

## 2021-05-30 DIAGNOSIS — I10 Essential (primary) hypertension: Secondary | ICD-10-CM | POA: Diagnosis not present

## 2021-05-30 DIAGNOSIS — R7303 Prediabetes: Secondary | ICD-10-CM | POA: Diagnosis not present

## 2021-06-04 DIAGNOSIS — J449 Chronic obstructive pulmonary disease, unspecified: Secondary | ICD-10-CM | POA: Diagnosis not present

## 2021-06-04 DIAGNOSIS — G609 Hereditary and idiopathic neuropathy, unspecified: Secondary | ICD-10-CM | POA: Diagnosis not present

## 2021-06-04 DIAGNOSIS — M5126 Other intervertebral disc displacement, lumbar region: Secondary | ICD-10-CM | POA: Diagnosis not present

## 2021-06-04 DIAGNOSIS — G894 Chronic pain syndrome: Secondary | ICD-10-CM | POA: Diagnosis not present

## 2021-06-21 ENCOUNTER — Encounter: Payer: Self-pay | Admitting: Gastroenterology

## 2021-07-10 DIAGNOSIS — I10 Essential (primary) hypertension: Secondary | ICD-10-CM | POA: Diagnosis not present

## 2021-07-10 DIAGNOSIS — F5221 Male erectile disorder: Secondary | ICD-10-CM | POA: Diagnosis not present

## 2021-07-10 DIAGNOSIS — F411 Generalized anxiety disorder: Secondary | ICD-10-CM | POA: Diagnosis not present

## 2021-07-10 DIAGNOSIS — R000 Tachycardia, unspecified: Secondary | ICD-10-CM | POA: Diagnosis not present

## 2021-07-24 DIAGNOSIS — I10 Essential (primary) hypertension: Secondary | ICD-10-CM | POA: Diagnosis not present

## 2021-07-24 DIAGNOSIS — G894 Chronic pain syndrome: Secondary | ICD-10-CM | POA: Diagnosis not present

## 2021-07-24 DIAGNOSIS — R000 Tachycardia, unspecified: Secondary | ICD-10-CM | POA: Diagnosis not present

## 2021-08-01 DIAGNOSIS — I10 Essential (primary) hypertension: Secondary | ICD-10-CM | POA: Diagnosis not present

## 2021-08-22 ENCOUNTER — Ambulatory Visit: Payer: BC Managed Care – PPO | Admitting: Gastroenterology

## 2021-08-30 DIAGNOSIS — I10 Essential (primary) hypertension: Secondary | ICD-10-CM | POA: Diagnosis not present

## 2021-10-03 DIAGNOSIS — T22212D Burn of second degree of left forearm, subsequent encounter: Secondary | ICD-10-CM | POA: Diagnosis not present

## 2021-10-03 DIAGNOSIS — T22291D Burn of second degree of multiple sites of right shoulder and upper limb, except wrist and hand, subsequent encounter: Secondary | ICD-10-CM | POA: Diagnosis not present

## 2021-10-03 DIAGNOSIS — Z6831 Body mass index (BMI) 31.0-31.9, adult: Secondary | ICD-10-CM | POA: Diagnosis not present

## 2021-10-03 DIAGNOSIS — M792 Neuralgia and neuritis, unspecified: Secondary | ICD-10-CM | POA: Diagnosis not present

## 2021-10-06 DIAGNOSIS — F411 Generalized anxiety disorder: Secondary | ICD-10-CM | POA: Diagnosis not present

## 2021-10-06 DIAGNOSIS — G894 Chronic pain syndrome: Secondary | ICD-10-CM | POA: Diagnosis not present

## 2021-10-06 DIAGNOSIS — H748X9 Other specified disorders of middle ear and mastoid, unspecified ear: Secondary | ICD-10-CM | POA: Diagnosis not present

## 2021-10-15 ENCOUNTER — Other Ambulatory Visit (HOSPITAL_COMMUNITY): Payer: Self-pay | Admitting: Family Medicine

## 2021-10-15 ENCOUNTER — Other Ambulatory Visit: Payer: Self-pay | Admitting: Family Medicine

## 2021-10-15 DIAGNOSIS — F17218 Nicotine dependence, cigarettes, with other nicotine-induced disorders: Secondary | ICD-10-CM | POA: Diagnosis not present

## 2021-10-15 DIAGNOSIS — R1310 Dysphagia, unspecified: Secondary | ICD-10-CM | POA: Diagnosis not present

## 2021-10-15 DIAGNOSIS — I10 Essential (primary) hypertension: Secondary | ICD-10-CM | POA: Diagnosis not present

## 2021-10-15 DIAGNOSIS — R079 Chest pain, unspecified: Secondary | ICD-10-CM | POA: Diagnosis not present

## 2021-10-15 DIAGNOSIS — R42 Dizziness and giddiness: Secondary | ICD-10-CM | POA: Diagnosis not present

## 2021-10-15 DIAGNOSIS — Z716 Tobacco abuse counseling: Secondary | ICD-10-CM | POA: Diagnosis not present

## 2021-10-15 DIAGNOSIS — R200 Anesthesia of skin: Secondary | ICD-10-CM | POA: Diagnosis not present

## 2021-10-24 ENCOUNTER — Ambulatory Visit: Payer: BC Managed Care – PPO | Admitting: Gastroenterology

## 2021-12-11 DIAGNOSIS — F5221 Male erectile disorder: Secondary | ICD-10-CM | POA: Diagnosis not present

## 2021-12-11 DIAGNOSIS — G8929 Other chronic pain: Secondary | ICD-10-CM | POA: Diagnosis not present

## 2021-12-11 DIAGNOSIS — F411 Generalized anxiety disorder: Secondary | ICD-10-CM | POA: Diagnosis not present

## 2021-12-11 DIAGNOSIS — R030 Elevated blood-pressure reading, without diagnosis of hypertension: Secondary | ICD-10-CM | POA: Diagnosis not present

## 2021-12-13 NOTE — Progress Notes (Signed)
Synopsis: Referred for dyspnea by Benita StabileHall, Darrian Z, MD  Subjective:   PATIENT ID: Mathew Brady GENDER: male DOB: 02/14/1966, MRN: [id number]  Chief Complaint  Patient presents with   Consult   [phone] with history of CAD, COPD (A1AT MM) seen in past by Dr. Jamison NeighborNestor, FM, GERD, opioid use, 50 py smoker, lingula hamartoma s/p uncomplicated VATS resection 2019 referred for dyspnea  He says that he's having worsening dyspnea, DOE over last 4 weeks especially. Worse cough, better over last week but had 3 weeks of very productive cough. Briefly had hemoptysis but this has cleared up. ABX/steroids prescribed for this by PCP. He has not had CP.   Otherwise pertinent review of systems is negative.  Grandfather, grandmother and uncle had lung cancer. Mother with emphysema  Works outside at [employer]. Does still have 2 birds. Still smoking 1 ppd.   Past Medical History:  Diagnosis Date   Anemia 06/18/2012   Anxiety    Arthritis    CAD (coronary artery disease)    Mild to moderate nonobstructive CAD at cardiac catheterization 2018   COPD (chronic obstructive pulmonary disease) (HCC)    Essential hypertension    Fibromyalgia    GERD (gastroesophageal reflux disease)    History of kidney stones    Opiate use    Palpitations    Peripheral neuropathy    Pulmonary nodule    Vitamin B 12 deficiency      Family History  Problem Relation Age of Onset   Fibromyalgia Mother    Bladder Cancer Father 1572   Benign prostatic hyperplasia Father    Healthy Son    Heart failure Paternal Grandmother    Heart failure Paternal Grandfather    Lung cancer Paternal Grandfather    Lung cancer Maternal Uncle    Emphysema Maternal Uncle    Lung cancer Paternal Uncle    Lung cancer Maternal Grandmother    Stomach cancer Paternal Uncle      Past Surgical History:  Procedure Laterality Date   COLONOSCOPY     found to have 1 polyp   ESOPHAGEAL DILATION  1993   LEFT HEART CATH AND CORONARY ANGIOGRAPHY N/A  12/09/2016   Procedure: Left Heart Cath and Coronary Angiography;  Surgeon: Yvonne KendallEnd, Christopher, MD;  Location: [facility];  Service: Cardiovascular;  Laterality: N/A;   TONSILLECTOMY     VIDEO ASSISTED THORACOSCOPY (VATS)/WEDGE RESECTION Left 09/08/2017   Procedure: VIDEO ASSISTED THORACOSCOPY (VATS)/WEDGE RESECTION;  Surgeon: Loreli SlotHendrickson, Steven C, MD;  [facility];  Service: Thoracic;  Laterality: Left;    Social History   Socioeconomic History   Marital status: Married    Spouse name: Not on file   Number of children: Not on file   Years of education: Not on file   Highest education level: Not on file  Occupational History   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  Has 5 children all together.  Not working at this time.  Trying to get disability.  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   Education: Some high school.      [facility] (12/13/16):   Originally from [facility]. 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 Currently has a Nurse, adult. Also has horses, a donkey, dogs, and a cat. No mold exposure. No hot tub exposure.    Social Determinants of Health   Financial Resource Strain: Not on file  Food Insecurity: Not on file  Transportation Needs: Not on file  Physical Activity: Not on file  Stress: Not on file   Social Connections: Not on file  Intimate Partner Violence: Not on file     No Known Allergies   Outpatient Medications Prior to Visit  Medication Sig Dispense Refill   acetaminophen (TYLENOL) 500 MG tablet Take 2 tablets (1,000 mg total) by mouth every 6 (six) hours. 30 tablet 0   albuterol (PROVENTIL HFA;VENTOLIN HFA) 108 (90 Base) MCG/ACT inhaler Inhale 2 puffs into the lungs every 4 (four) hours as needed for wheezing or shortness of breath. 1 Inhaler 6   ALPRAZolam (XANAX) 0.5 MG tablet Take 0.5 mg by mouth at bedtime as needed for anxiety or sleep.   0   aspirin EC 81 MG tablet Take 1 tablet (81 mg total) by mouth daily. 90 tablet 3   gabapentin (NEURONTIN) 800 MG tablet TAKE 1 TABLET BY MOUTH THREE TIMES A DAY 270 tablet 0   metoprolol succinate (TOPROL-XL) 25 MG 24 hr tablet Take 25 mg by mouth daily as needed (high blood pressure).      oxyCODONE-acetaminophen (PERCOCET) 10-325 MG tablet Take 1 tablet by mouth every 6 (six) hours.      budesonide (PULMICORT) 0.25 MG/2ML nebulizer solution Take 2 mLs (0.25 mg total) by nebulization 2 (two) times daily. (Patient not taking: Reported on 12/14/2021) 120 mL 1   Fluticasone-Umeclidin-Vilant (TRELEGY ELLIPTA) 100-62.5-25 MCG/INH AEPB Inhale 1 puff into the lungs daily. (Patient not taking: Reported on 12/14/2021) 1 each 0   lisinopril (PRINIVIL,ZESTRIL) 5 MG tablet Take 1 tablet (5 mg total) by mouth daily. (Patient not taking: Reported on 12/14/2021) 30 tablet 1   methocarbamol (ROBAXIN) 500 MG tablet Take 2 tablets (1,000 mg total) by mouth every 8 (eight) hours as needed for muscle spasms. (Patient not taking: Reported on 12/14/2021) 30 tablet 0   Oxycodone HCl 10 MG TABS Take 1 tablet (10 mg total) by mouth every 6 (six) hours as needed. (Patient not taking: Reported on 12/14/2021) 30 tablet 0   pantoprazole (PROTONIX) 40 MG tablet Take 40 mg by mouth daily as needed (indigestion).  (Patient not taking: Reported on 12/14/2021)     predniSONE  (DELTASONE) 20 MG tablet 3 tabs po day one, then 2 po daily x 4 days (Patient not taking: Reported on 12/14/2021) 11 tablet 0   No facility-administered medications prior to visit.       Objective:   Physical Exam:  General appearance: 56 y.o., male, NAD, conversant  Eyes: anicteric sclerae; PERRL, tracking appropriately HENT: NCAT; MMM Neck: Trachea midline; no lymphadenopathy, no JVD Lungs: CTAB, no crackles, no wheeze, with normal respiratory effort CV: RRR, no murmur  Abdomen: Soft, non-tender; non-distended,  BS present  Extremities: No peripheral edema, warm Skin: Normal turgor and texture; no rash Psych: Appropriate affect Neuro: Alert and oriented to person and place, no focal deficit     Vitals:   12/14/21 1536  BP: (!) 142/88  Pulse: 75  Temp: 98.7 F (37.1 C)  TempSrc: Oral  SpO2: 97%  Weight: 208 lb (94.3 kg)  Height: 5\' 9"  (1.753 m)   97% on RA BMI Readings from Last 3 Encounters:  12/14/21 30.72 kg/m  03/26/18 29.53 kg/m  09/30/17 29.00 kg/m   Wt Readings from Last 3 Encounters:  12/14/21 208 lb (94.3 kg)  03/26/18 200 lb (90.7 kg)  09/30/17 196 lb 6.4 oz (89.1 kg)     CBC    Component Value Date/Time   WBC 11.3 (H) 09/09/2017 0400   RBC 3.93 (L) 09/09/2017 0400   HGB 12.8 (L) 09/09/2017 0400   HCT 38.4 (L) 09/09/2017 0400   PLT 251 09/09/2017 0400   MCV 97.7 09/09/2017 0400   MCH 32.6 09/09/2017 0400   MCHC 33.3 09/09/2017 0400   RDW 12.7 09/09/2017 0400   LYMPHSABS 4.1 (H) 12/09/2016 0152   MONOABS 0.8 12/09/2016 0152   EOSABS 0.1 12/09/2016 0152   BASOSABS 0.0 12/09/2016 0152      Chest Imaging: CT Chest 2019 reviewed by me with stable to increased size of 79mm lingula lobulated nodule (not FDG avid on PET from 2018), upper lobe predominant emphysema, small stable nodules  Pulmonary Functions Testing Results:    Latest Ref Rng & Units 01/10/2017    2:52 PM  PFT Results  FVC-Pre L 3.96   FVC-Predicted Pre % 81   FVC-Post L  4.57   FVC-Predicted Post % 94   Pre FEV1/FVC % % 46   Post FEV1/FCV % % 81   FEV1-Pre L 1.81   FEV1-Predicted Pre % 48   FEV1-Post L 3.73   TLC L 4.72   TLC % Predicted % 69   RV % Predicted % 9    Reviewed by me and hard to interpret, possibly unreliable. May have mild obstruction with best BD response I've ever seen, air trapping or simply poorly performed baseline spiro  Echocardiogram:   TTE 2013 no comment on diastolic function  TTE 2022:   1. The left ventricle is normal in size with mildly increased wall  thickness.    2. The left ventricular systolic function is hyperdynamic, LVEF is visually  estimated at >70%.    3. The right ventricle is normal in size, with normal systolic function.   Heart Catheterization:   LHC 2018 with nCAD and LVEDP 17  LHC 2021 with nCAD and LVEDP 14, brief run of SVT    Assessment & Plan:   # DOE # Suspected COPD Possibly due to worsened control of COPD off maintenance therapy. Sounds like he may have recently had AECOPD treated with ABX/steroid.  # Hemoptysis Likely due to AECOPD but longstanding smokder  # Smoking  # history of hamartoma s/p lingula wedge resection 2019  Plan: - cbc, BNP, TSH - CT Chest  - start trelegy 1 puff once daily - albuterol prn - PFT next visit - address smoking cessation next visit   RTC 6 weeks  2020, MD [facility] 12/14/2021 3:54 PM

## 2021-12-14 ENCOUNTER — Ambulatory Visit (INDEPENDENT_AMBULATORY_CARE_PROVIDER_SITE_OTHER): Payer: BC Managed Care – PPO | Admitting: Student

## 2021-12-14 ENCOUNTER — Encounter: Payer: Self-pay | Admitting: Student

## 2021-12-14 VITALS — BP 142/88 | HR 75 | Temp 98.7°F | Ht 69.0 in | Wt 208.0 lb

## 2021-12-14 DIAGNOSIS — R042 Hemoptysis: Secondary | ICD-10-CM | POA: Diagnosis not present

## 2021-12-14 DIAGNOSIS — R0609 Other forms of dyspnea: Secondary | ICD-10-CM

## 2021-12-14 MED ORDER — TRELEGY ELLIPTA 100-62.5-25 MCG/ACT IN AEPB: 1.0000 | INHALATION_SPRAY | Freq: Every day | RESPIRATORY_TRACT | 0 refills | Status: DC

## 2021-12-14 NOTE — Addendum Note (Signed)
Addended by: Bradd Canary L on: 12/14/2021 04:16 PM   Modules accepted: Orders

## 2021-12-14 NOTE — Patient Instructions (Signed)
-   labs today - you will be called to schedule ct chest - call or send message if you have any questions about your results before next visit - you'll be called to schedule PFTs - restart trelegy 1 puff once daily rinse mouth after use - albuterol as needed - see you in 6 weeks or sooner if need be!

## 2021-12-15 LAB — CBC WITH DIFFERENTIAL/PLATELET
Absolute Monocytes: 832 cells/uL (ref 200–950)
Basophils Absolute: 40 cells/uL (ref 0–200)
Basophils Relative: 0.5 %
Eosinophils Absolute: 48 cells/uL (ref 15–500)
Eosinophils Relative: 0.6 %
HCT: 45.3 % (ref 38.5–50.0)
Hemoglobin: 15.5 g/dL (ref 13.2–17.1)
Lymphs Abs: 2488 cells/uL (ref 850–3900)
MCH: 32.4 pg (ref 27.0–33.0)
MCHC: 34.2 g/dL (ref 32.0–36.0)
MCV: 94.6 fL (ref 80.0–100.0)
MPV: 11.6 fL (ref 7.5–12.5)
Monocytes Relative: 10.4 %
Neutro Abs: 4592 cells/uL (ref 1500–7800)
Neutrophils Relative %: 57.4 %
Platelets: 342 10*3/uL (ref 140–400)
RBC: 4.79 10*6/uL (ref 4.20–5.80)
RDW: 13.8 % (ref 11.0–15.0)
Total Lymphocyte: 31.1 %
WBC: 8 10*3/uL (ref 3.8–10.8)

## 2021-12-15 LAB — BRAIN NATRIURETIC PEPTIDE: Brain Natriuretic Peptide: 22 pg/mL (ref <100)

## 2021-12-15 LAB — TSH: TSH: 2.44 mIU/L (ref 0.40–4.50)

## 2021-12-24 ENCOUNTER — Emergency Department (HOSPITAL_COMMUNITY)
Admission: EM | Admit: 2021-12-24 | Discharge: 2021-12-24 | Discharge status: Against Medical Advice | Payer: BC Managed Care – PPO | Attending: Emergency Medicine | Admitting: Emergency Medicine

## 2021-12-24 ENCOUNTER — Other Ambulatory Visit: Payer: Self-pay

## 2021-12-24 ENCOUNTER — Encounter (HOSPITAL_COMMUNITY): Payer: Self-pay | Admitting: *Deleted

## 2021-12-24 ENCOUNTER — Emergency Department (HOSPITAL_COMMUNITY): Payer: BC Managed Care – PPO

## 2021-12-24 DIAGNOSIS — R911 Solitary pulmonary nodule: Secondary | ICD-10-CM | POA: Diagnosis not present

## 2021-12-24 DIAGNOSIS — R0602 Shortness of breath: Secondary | ICD-10-CM | POA: Diagnosis not present

## 2021-12-24 DIAGNOSIS — M25512 Pain in left shoulder: Secondary | ICD-10-CM | POA: Diagnosis not present

## 2021-12-24 DIAGNOSIS — M25511 Pain in right shoulder: Secondary | ICD-10-CM | POA: Insufficient documentation

## 2021-12-24 DIAGNOSIS — R042 Hemoptysis: Secondary | ICD-10-CM | POA: Diagnosis not present

## 2021-12-24 DIAGNOSIS — Z720 Tobacco use: Secondary | ICD-10-CM | POA: Diagnosis not present

## 2021-12-24 DIAGNOSIS — S29019A Strain of muscle and tendon of unspecified wall of thorax, initial encounter: Secondary | ICD-10-CM | POA: Diagnosis not present

## 2021-12-24 DIAGNOSIS — R079 Chest pain, unspecified: Secondary | ICD-10-CM | POA: Diagnosis not present

## 2021-12-24 DIAGNOSIS — R059 Cough, unspecified: Secondary | ICD-10-CM | POA: Insufficient documentation

## 2021-12-24 DIAGNOSIS — Z5321 Procedure and treatment not carried out due to patient leaving prior to being seen by health care provider: Secondary | ICD-10-CM | POA: Diagnosis not present

## 2021-12-24 DIAGNOSIS — J441 Chronic obstructive pulmonary disease with (acute) exacerbation: Secondary | ICD-10-CM | POA: Diagnosis not present

## 2021-12-24 NOTE — ED Triage Notes (Signed)
Pt c/o sob and bilateral shoulder pain; pt states he is coughing up blood and had a syncopal episode yesterday

## 2021-12-24 NOTE — ED Notes (Signed)
Pt not in waiting area x 1 

## 2021-12-24 NOTE — ED Notes (Addendum)
Pt not in waiting area x 3 

## 2021-12-24 NOTE — ED Notes (Signed)
Pt not in waiting area x 2 

## 2022-01-01 ENCOUNTER — Ambulatory Visit (HOSPITAL_COMMUNITY): Payer: BC Managed Care – PPO

## 2022-01-16 DIAGNOSIS — G894 Chronic pain syndrome: Secondary | ICD-10-CM | POA: Diagnosis not present

## 2022-01-16 DIAGNOSIS — F411 Generalized anxiety disorder: Secondary | ICD-10-CM | POA: Diagnosis not present

## 2022-01-16 DIAGNOSIS — I10 Essential (primary) hypertension: Secondary | ICD-10-CM | POA: Diagnosis not present

## 2022-03-13 ENCOUNTER — Other Ambulatory Visit: Payer: Self-pay | Admitting: Family Medicine

## 2022-03-13 ENCOUNTER — Other Ambulatory Visit (HOSPITAL_COMMUNITY): Payer: Self-pay | Admitting: Family Medicine

## 2022-03-13 DIAGNOSIS — M5126 Other intervertebral disc displacement, lumbar region: Secondary | ICD-10-CM

## 2022-03-13 DIAGNOSIS — G894 Chronic pain syndrome: Secondary | ICD-10-CM | POA: Diagnosis not present

## 2022-03-13 DIAGNOSIS — G609 Hereditary and idiopathic neuropathy, unspecified: Secondary | ICD-10-CM | POA: Diagnosis not present

## 2022-03-13 DIAGNOSIS — J449 Chronic obstructive pulmonary disease, unspecified: Secondary | ICD-10-CM | POA: Diagnosis not present

## 2022-03-13 DIAGNOSIS — Z136 Encounter for screening for cardiovascular disorders: Secondary | ICD-10-CM | POA: Diagnosis not present

## 2022-03-13 DIAGNOSIS — R7301 Impaired fasting glucose: Secondary | ICD-10-CM | POA: Diagnosis not present

## 2022-04-02 ENCOUNTER — Encounter (HOSPITAL_COMMUNITY): Payer: Self-pay

## 2022-04-02 ENCOUNTER — Ambulatory Visit (HOSPITAL_COMMUNITY): Payer: BC Managed Care – PPO

## 2022-07-04 DIAGNOSIS — G8929 Other chronic pain: Secondary | ICD-10-CM | POA: Diagnosis not present

## 2022-07-04 DIAGNOSIS — R1900 Intra-abdominal and pelvic swelling, mass and lump, unspecified site: Secondary | ICD-10-CM | POA: Diagnosis not present

## 2022-07-31 DIAGNOSIS — M4726 Other spondylosis with radiculopathy, lumbar region: Secondary | ICD-10-CM | POA: Diagnosis not present

## 2022-07-31 DIAGNOSIS — Z6829 Body mass index (BMI) 29.0-29.9, adult: Secondary | ICD-10-CM | POA: Diagnosis not present

## 2022-07-31 DIAGNOSIS — M5116 Intervertebral disc disorders with radiculopathy, lumbar region: Secondary | ICD-10-CM | POA: Diagnosis not present

## 2022-08-07 DIAGNOSIS — M5116 Intervertebral disc disorders with radiculopathy, lumbar region: Secondary | ICD-10-CM | POA: Diagnosis not present

## 2022-08-14 DIAGNOSIS — M4726 Other spondylosis with radiculopathy, lumbar region: Secondary | ICD-10-CM | POA: Diagnosis not present

## 2022-08-14 DIAGNOSIS — Z6828 Body mass index (BMI) 28.0-28.9, adult: Secondary | ICD-10-CM | POA: Diagnosis not present

## 2022-08-14 DIAGNOSIS — M5431 Sciatica, right side: Secondary | ICD-10-CM | POA: Diagnosis not present

## 2022-08-19 DIAGNOSIS — M5126 Other intervertebral disc displacement, lumbar region: Secondary | ICD-10-CM | POA: Diagnosis not present

## 2022-08-26 DIAGNOSIS — M5416 Radiculopathy, lumbar region: Secondary | ICD-10-CM | POA: Diagnosis not present

## 2022-09-01 ENCOUNTER — Emergency Department (HOSPITAL_COMMUNITY)
Admission: EM | Admit: 2022-09-01 | Discharge: 2022-09-01 | Disposition: A | Discharge status: Home / Self Care | Payer: BC Managed Care – PPO | Attending: Student | Admitting: Student

## 2022-09-01 ENCOUNTER — Encounter (HOSPITAL_COMMUNITY): Payer: Self-pay

## 2022-09-01 ENCOUNTER — Telehealth (HOSPITAL_COMMUNITY): Payer: Self-pay | Admitting: Student

## 2022-09-01 ENCOUNTER — Other Ambulatory Visit: Payer: Self-pay

## 2022-09-01 DIAGNOSIS — Z7951 Long term (current) use of inhaled steroids: Secondary | ICD-10-CM | POA: Insufficient documentation

## 2022-09-01 DIAGNOSIS — I2510 Atherosclerotic heart disease of native coronary artery without angina pectoris: Secondary | ICD-10-CM | POA: Insufficient documentation

## 2022-09-01 DIAGNOSIS — Z79899 Other long term (current) drug therapy: Secondary | ICD-10-CM | POA: Diagnosis not present

## 2022-09-01 DIAGNOSIS — M549 Dorsalgia, unspecified: Secondary | ICD-10-CM | POA: Diagnosis not present

## 2022-09-01 DIAGNOSIS — M5441 Lumbago with sciatica, right side: Secondary | ICD-10-CM | POA: Diagnosis not present

## 2022-09-01 DIAGNOSIS — F17210 Nicotine dependence, cigarettes, uncomplicated: Secondary | ICD-10-CM | POA: Diagnosis not present

## 2022-09-01 DIAGNOSIS — M5440 Lumbago with sciatica, unspecified side: Secondary | ICD-10-CM | POA: Insufficient documentation

## 2022-09-01 DIAGNOSIS — I10 Essential (primary) hypertension: Secondary | ICD-10-CM | POA: Diagnosis not present

## 2022-09-01 DIAGNOSIS — Z7982 Long term (current) use of aspirin: Secondary | ICD-10-CM | POA: Diagnosis not present

## 2022-09-01 DIAGNOSIS — J449 Chronic obstructive pulmonary disease, unspecified: Secondary | ICD-10-CM | POA: Insufficient documentation

## 2022-09-01 MED ORDER — OXYCODONE-ACETAMINOPHEN 10-325 MG PO TABS: 1.0000 | ORAL_TABLET | ORAL | 0 refills | Status: DC | PRN

## 2022-09-01 MED ORDER — HYDROMORPHONE HCL 1 MG/ML IJ SOLN
1.0000 mg | Freq: Once | INTRAMUSCULAR | Status: AC
  Administered 2022-09-01: 1 mg via INTRAMUSCULAR
  Filled 2022-09-01: qty 1

## 2022-09-01 MED ORDER — LIDOCAINE 5 % EX PTCH
1.0000 | MEDICATED_PATCH | CUTANEOUS | 0 refills | Status: DC
Start: 2022-09-01 — End: 2023-06-29

## 2022-09-01 MED ORDER — LIDOCAINE 5 % EX PTCH
1.0000 | MEDICATED_PATCH | CUTANEOUS | Status: DC
  Administered 2022-09-01: 1 via TRANSDERMAL
  Filled 2022-09-01: qty 1

## 2022-09-01 MED ORDER — KETOROLAC TROMETHAMINE 15 MG/ML IJ SOLN
15.0000 mg | Freq: Once | INTRAMUSCULAR | Status: AC
  Administered 2022-09-01: 15 mg via INTRAMUSCULAR
  Filled 2022-09-01: qty 1

## 2022-09-01 NOTE — ED Triage Notes (Signed)
Pt arrived via POV c/o right leg/knee pain and L4/L5 back pain. Pt reports recent MRI and nerve block procedure last week, but pain worsened per the Pt after the nerve block procedure. Pt presents with right knee swelling and limited mobility in RLE.

## 2022-09-01 NOTE — ED Notes (Signed)
ED Provider at bedside. 

## 2022-09-01 NOTE — ED Provider Notes (Signed)
[facility] Provider Note  CSN: [id number] Arrival date & time: 09/01/22 0117  Chief Complaint(s) Leg Pain  HPI Mathew Brady is a 57 y.o. male with PMH CAD, COPD, vitamin B12 deficiency, chronic back pain with opioid dependence who presents emergency room for evaluation of back and lower extremity pain.  Patient states that he recently saw his neurosurgeon who performed an outpatient MRI and he was found to have severe joint space narrowing and will likely be scheduled for outpatient surgery.  He recently had a nerve block performed and is back that brought him temporary relief but this is since worn off and he states he is unable to ambulate now due to the pain.  He has been chronically taking Percocet 10-325 for almost 7 years and is breaking through this regimen.  Denies trauma or recent fall.  States that he noticed some atypical swelling of his right knee a few days ago but this is already starting to improve and his pain is primarily in his back today.  No saddle anesthesia, numbness, tingling, weakness of lower extremities or other red flag signs of back pain.   Past Medical History Past Medical History:  Diagnosis Date   Anemia 06/18/2012   Anxiety    Arthritis    CAD (coronary artery disease)    Mild to moderate nonobstructive CAD at cardiac catheterization 2018   COPD (chronic obstructive pulmonary disease) (HCC)    Essential hypertension    Fibromyalgia    GERD (gastroesophageal reflux disease)    History of kidney stones    Opiate use    Palpitations    Peripheral neuropathy    Pulmonary nodule    Vitamin B 12 deficiency    Patient Active Problem List   Diagnosis Date Noted   Lung nodule 09/08/2017   Cough 03/25/2017   COPD, severe ([place]) 01/22/2017   Asbestos exposure 12/13/2016   Unstable angina ([place])    Chest pain 08/25/2016   Neurological complaint 08/25/2016   Hyperlipidemia LDL goal <130 12/21/2015   Neuropathy,  peripheral 06/19/2015   Substance induced mood disorder ([place]) 02/14/2012   Polysubstance dependence ([place]) 02/12/2012   Sleep difficulties 02/11/2012   Anxiety 01/27/2012   Benign hypertension 01/27/2012   Opiate dependence ([place]) 01/27/2012   Benzodiazepine dependence ([place]) 01/27/2012   Dental caries 01/27/2012   TOBACCO DEPENDENCE 09/04/2006   MIGRAINE, UNSPEC., W/O INTRACTABLE MIGRAINE 09/04/2006   Fibromyalgia 09/04/2006   Home Medication(s) Prior to Admission medications   Medication Sig Start Date End Date Taking? Authorizing Provider  acetaminophen (TYLENOL) 500 MG tablet Take 2 tablets (1,000 mg total) by mouth every 6 (six) hours. 03/26/18  Yes Julianne Rice, MD  albuterol (PROVENTIL HFA;VENTOLIN HFA) 108 (90 Base) MCG/ACT inhaler Inhale 2 puffs into the lungs every 4 (four) hours as needed for wheezing or shortness of breath. 03/25/17  Yes Javier Glazier, MD  ALPRAZolam Duanne Moron) 0.5 MG tablet Take 0.5 mg by mouth at bedtime as needed for anxiety or sleep.  08/13/17  Yes [provider]  aspirin EC 81 MG tablet Take 1 tablet (81 mg total) by mouth daily. 11/28/16  Yes Dunn, Dayna N, PA-C  gabapentin (NEURONTIN) 800 MG tablet TAKE 1 TABLET BY MOUTH THREE TIMES A DAY 02/05/17  Yes Dettinger, Fransisca Kaufmann, MD  oxyCODONE-acetaminophen (PERCOCET) 10-325 MG tablet Take 1 tablet by mouth every 6 (six) hours.  03/05/18  Yes [provider]  traMADol (ULTRAM) 50 MG tablet 1 tab PO  BID PRN for BREAKTHROUGH PAIN for 7 days 08/19/22  Yes [provider]  amLODipine (NORVASC) 10 MG tablet Take 1 tablet by mouth daily.    [provider]  budesonide (PULMICORT) 0.25 MG/2ML nebulizer solution Take 2 mLs (0.25 mg total) by nebulization 2 (two) times daily. Patient not taking: Reported on 12/14/2021 09/11/17   Gailen Giovanni, PA-C  Fluticasone-Umeclidin-Vilant (TRELEGY ELLIPTA) 100-62.5-25 MCG/ACT AEPB Inhale 1 puff into the lungs daily. 12/14/21   Maryjane Hurter, MD   Fluticasone-Umeclidin-Vilant (TRELEGY ELLIPTA) 100-62.5-25 MCG/INH AEPB Inhale 1 puff into the lungs daily. Patient not taking: Reported on 12/14/2021 05/05/17   Javier Glazier, MD  lisinopril (PRINIVIL,ZESTRIL) 5 MG tablet Take 1 tablet (5 mg total) by mouth daily. Patient not taking: Reported on 12/14/2021 09/11/17   Jadene Pierini E, PA-C  methocarbamol (ROBAXIN) 500 MG tablet Take 2 tablets (1,000 mg total) by mouth every 8 (eight) hours as needed for muscle spasms. Patient not taking: Reported on 12/14/2021 03/26/18   Julianne Rice, MD  metoprolol succinate (TOPROL-XL) 25 MG 24 hr tablet Take 25 mg by mouth daily as needed (high blood pressure).     [provider]  Oxycodone HCl 10 MG TABS Take 1 tablet (10 mg total) by mouth every 6 (six) hours as needed. Patient not taking: Reported on 12/14/2021 09/11/17   Qais Giovanni, PA-C  oxyCODONE-acetaminophen (PERCOCET) 10-325 MG tablet Take by mouth.    [provider]  pantoprazole (PROTONIX) 40 MG tablet Take 40 mg by mouth daily as needed (indigestion).  Patient not taking: Reported on 12/14/2021 03/16/18   [provider]  predniSONE (DELTASONE) 20 MG tablet 3 tabs po day one, then 2 po daily x 4 days Patient not taking: Reported on 12/14/2021 03/27/18   Julianne Rice, MD                                                                                                                                    Past Surgical History Past Surgical History:  Procedure Laterality Date   COLONOSCOPY     found to have 1 polyp   [place] CATH AND CORONARY ANGIOGRAPHY N/A 12/09/2016   Procedure: Left Heart Cath and Coronary Angiography;  Surgeon: Nelva Bush, MD;  Location: [facility];  Service: Cardiovascular;  Laterality: N/A;   TONSILLECTOMY     VIDEO ASSISTED THORACOSCOPY (VATS)/WEDGE RESECTION Left 09/08/2017   Procedure: VIDEO ASSISTED THORACOSCOPY (VATS)/WEDGE RESECTION;  Surgeon: Melrose Nakayama, MD;  [facility];  Service: Thoracic;  Laterality: Left;   Family History Family History  Problem Relation Age of Onset   Fibromyalgia Mother    Bladder Cancer Father 82   Benign prostatic hyperplasia Father    Healthy Son    Heart failure Paternal Grandmother    Heart failure Paternal Grandfather    Lung cancer Paternal Grandfather  Lung cancer Maternal Uncle    Emphysema Maternal Uncle    Lung cancer Paternal Uncle    Lung cancer Maternal Grandmother    Stomach cancer Paternal Uncle     Social History Social History   Tobacco Use   Smoking status: Every Day    Packs/day: 1.00    Years: 35.00    Total pack years: 35.00    Types: Cigarettes    Start date: 03/12/1979    Last attempt to quit: 03/09/2015    Years since quitting: 7.4   Smokeless tobacco: Never   Tobacco comments:    1ppd 12/14/21  Vaping Use   Vaping Use: Every day   Start date: 10/11/2016  Substance Use Topics   Alcohol use: No    Alcohol/week: 0.0 standard drinks of alcohol   Drug use: No   Allergies Succinylcholine and Pregabalin  Review of Systems Review of Systems  Musculoskeletal:  Positive for back pain.    Physical Exam Vital Signs  I have reviewed the triage vital signs BP (!) 188/89   Pulse 85   Temp 97.6 F (36.4 C) (Oral)   Resp 20   Ht '5\' 9"'$  (1.753 m)   Wt 92 kg   SpO2 99%   BMI 29.95 kg/m   Physical Exam Constitutional:      General: He is not in acute distress.    Appearance: Normal appearance.  HENT:     Head: Normocephalic and atraumatic.     Nose: No congestion or rhinorrhea.  Eyes:     General:        Right eye: No discharge.        Left eye: No discharge.     Extraocular Movements: Extraocular movements intact.     Pupils: Pupils are equal, round, and reactive to light.  Cardiovascular:     Rate and Rhythm: Normal rate and regular rhythm.     Heart sounds: No murmur heard. Pulmonary:     Effort: No respiratory distress.     Breath sounds: No  wheezing or rales.  Abdominal:     General: There is no distension.     Tenderness: There is no abdominal tenderness.  Musculoskeletal:        General: Tenderness present. Normal range of motion.     Cervical back: Normal range of motion.  Skin:    General: Skin is warm and dry.  Neurological:     General: No focal deficit present.     Mental Status: He is alert.     ED Results and Treatments Labs (all labs ordered are listed, but only abnormal results are displayed) Labs Reviewed - No data to display                                                                                                                        Radiology No results found.  Pertinent labs & imaging results that were available during my care of the patient were reviewed by  me and considered in my medical decision making (see MDM for details).  Medications Ordered in ED Medications  HYDROmorphone (DILAUDID) injection 1 mg (has no administration in time range)  ketorolac (TORADOL) 15 MG/ML injection 15 mg (has no administration in time range)  lidocaine (LIDODERM) 5 % 1 patch (has no administration in time range)                                                                                                                                     Procedures Procedures  (including critical care time)  Medical Decision Making / ED Course   This patient presents to the ED for concern of back pain, this involves an extensive number of treatment options, and is a complaint that carries with it a high risk of complications and morbidity.  The differential diagnosis includes acute on chronic back pain, foraminal stenosis, spinal stenosis, sciatica, musculoskeletal strain  MDM: Patient seen emerged part for evaluation of back pain.  Physical exam with tenderness in the L-spine but neurologic exam unremarkable.  Patient pain controlled with intramuscular Dilaudid and Toradol and a Lidoderm patch and on reevaluation  symptoms are improving.  I had a long discussion with the patient about his opioid dependence and agreed to a short prescription to bridge him to his outpatient neurosurgical appointment which she states he will follow-up soon as possible next week.  With no red flag signs of back pain, will defer advanced imaging at this time especially as he recently had an outpatient MRI.  Patient to discharge with outpatient follow-up   Additional history obtained: -Additional history obtained from son -External records from outside source obtained and reviewed including: Chart review including previous notes, labs, imaging, consultation notes   Medicines ordered and prescription drug management: Meds ordered this encounter  Medications   HYDROmorphone (DILAUDID) injection 1 mg   ketorolac (TORADOL) 15 MG/ML injection 15 mg   lidocaine (LIDODERM) 5 % 1 patch    -I have reviewed the patients home medicines and have made adjustments as needed  Critical interventions none   Cardiac Monitoring: The patient was maintained on a cardiac monitor.  I personally viewed and interpreted the cardiac monitored which showed an underlying rhythm of: NSR  Social Determinants of Health:  Factors impacting patients care include: Opioid dependence   Reevaluation: After the interventions noted above, I reevaluated the patient and found that they have :improved  Co morbidities that complicate the patient evaluation  Past Medical History:  Diagnosis Date   Anemia 06/18/2012   Anxiety    Arthritis    CAD (coronary artery disease)    Mild to moderate nonobstructive CAD at cardiac catheterization 2018   COPD (chronic obstructive pulmonary disease) (HCC)    Essential hypertension    Fibromyalgia    GERD (gastroesophageal reflux disease)    History of kidney stones    Opiate use    Palpitations  Peripheral neuropathy    Pulmonary nodule    Vitamin B 12 deficiency       Dispostion: I considered  admission for this patient, but at this time he does not meet inpatient criteria for admission and he is safe for discharge outpatient follow-up     Final Clinical Impression(s) / ED Diagnoses Final diagnoses:  None     '@PCDICTATION'$ @    Nylan Nakatani, Debe Coder, MD 09/01/22 [phone]

## 2022-09-04 DIAGNOSIS — Z6829 Body mass index (BMI) 29.0-29.9, adult: Secondary | ICD-10-CM | POA: Diagnosis not present

## 2022-09-04 DIAGNOSIS — M7989 Other specified soft tissue disorders: Secondary | ICD-10-CM | POA: Diagnosis not present

## 2022-09-05 ENCOUNTER — Other Ambulatory Visit (HOSPITAL_COMMUNITY): Payer: Self-pay | Admitting: Family Medicine

## 2022-09-05 ENCOUNTER — Ambulatory Visit (HOSPITAL_COMMUNITY)
Admission: RE | Admit: 2022-09-05 | Discharge: 2022-09-05 | Disposition: A | Discharge status: Home / Self Care | Payer: BC Managed Care – PPO | Source: Ambulatory Visit | Attending: Vascular Surgery | Admitting: Vascular Surgery

## 2022-09-05 DIAGNOSIS — R600 Localized edema: Secondary | ICD-10-CM | POA: Diagnosis not present

## 2022-09-11 DIAGNOSIS — Z6828 Body mass index (BMI) 28.0-28.9, adult: Secondary | ICD-10-CM | POA: Diagnosis not present

## 2022-09-11 DIAGNOSIS — M5431 Sciatica, right side: Secondary | ICD-10-CM | POA: Diagnosis not present

## 2022-09-14 DIAGNOSIS — M791 Myalgia, unspecified site: Secondary | ICD-10-CM | POA: Diagnosis not present

## 2022-09-14 DIAGNOSIS — M5126 Other intervertebral disc displacement, lumbar region: Secondary | ICD-10-CM | POA: Diagnosis not present

## 2022-09-14 DIAGNOSIS — F17210 Nicotine dependence, cigarettes, uncomplicated: Secondary | ICD-10-CM | POA: Diagnosis not present

## 2022-09-14 DIAGNOSIS — I10 Essential (primary) hypertension: Secondary | ICD-10-CM | POA: Diagnosis not present

## 2022-09-14 DIAGNOSIS — R600 Localized edema: Secondary | ICD-10-CM | POA: Diagnosis not present

## 2022-09-16 ENCOUNTER — Other Ambulatory Visit: Payer: Self-pay | Admitting: Orthopaedic Surgery

## 2022-09-25 ENCOUNTER — Ambulatory Visit: Payer: BC Managed Care – PPO | Admitting: Orthopaedic Surgery

## 2022-10-11 ENCOUNTER — Encounter: Payer: Self-pay | Admitting: Orthopaedic Surgery

## 2022-10-11 ENCOUNTER — Ambulatory Visit (INDEPENDENT_AMBULATORY_CARE_PROVIDER_SITE_OTHER): Payer: BC Managed Care – PPO | Admitting: Orthopaedic Surgery

## 2022-10-11 VITALS — BP 134/76 | HR 90 | Ht 69.0 in | Wt 185.0 lb

## 2022-10-11 DIAGNOSIS — M5126 Other intervertebral disc displacement, lumbar region: Secondary | ICD-10-CM | POA: Diagnosis not present

## 2022-10-11 NOTE — Progress Notes (Signed)
Office Visit Note   Patient: Mathew Brady           Date of Birth: 05-Apr-1966           MRN: [id number] Visit Date: 10/11/2022              Requested by: Benita Stabile, MD [address] [place],  [place] [address] PCP: Benita Stabile, MD   Assessment & Plan: Visit Diagnoses: No diagnosis found.  Plan: Reviewed plain radiographs MRI scan 2019 2024 done in February of this year.  He has mild progression of lumbar degeneration mild subarticular zone stenosis and mild neuroforaminal narrowing L4-5.  Discussed patient at this point I do not see anything that would require surgery.  He has some days when he is down in bed for a day or 2 before he can get up and around and we discussed this is likely related to some disc bulge at L4-5.  Currently ambulating and working.  He can follow-up on an as-needed basis.  Follow-Up Instructions: No follow-ups on file.   Orders:  No orders of the defined types were placed in this encounter.  No orders of the defined types were placed in this encounter.     Procedures: No procedures performed   Clinical Data: No additional findings.   Subjective: Chief Complaint  Patient presents with   Lower Back - Pain    HPI 57 year old male new patient visit for's parents are my patients here concerning back pain problems.  He states he has had maybe 20 epidurals disc degeneration at MRI scan January lumbar as well as 2019 which shows slight progression he has some disc bulge at L4-5 minimal in the midline and some mild foraminal narrowing right and left at L4-5.  He states his abdominal pain for a couple months he and his wife went on [employer].  He states in the day he has more discomfort in his back and legs.  Denies claudication symptoms.  At 1 point he said nerve block surgery was discussed.  He states after 1 injection he had swelling in his right lower extremity but it resolved.  He has been on chronic Percocet 10/3 2520 tablets a month plus  Xanax and also gabapentin.  No associated bowel or bladder symptoms.  Review of Systems patient states he is an occasional smoker.  Positive for history of angina unstable, lung nodule hyperlipidemia COPD.  All systems noncontributory other than mentioned above.   Objective: Vital Signs: BP 134/76   Pulse 90   Ht 5\' 9"  (1.753 m)   Wt 185 lb (83.9 kg)   BMI 27.32 kg/m   Physical Exam Constitutional:      Appearance: He is well-developed.  HENT:     Head: Normocephalic and atraumatic.     Right Ear: External ear normal.     Left Ear: External ear normal.  Eyes:     Pupils: Pupils are equal, round, and reactive to light.  Neck:     Thyroid: No thyromegaly.     Trachea: No tracheal deviation.  Cardiovascular:     Rate and Rhythm: Normal rate.  Pulmonary:     Effort: Pulmonary effort is normal.     Breath sounds: No wheezing.  Abdominal:     General: Bowel sounds are normal.     Palpations: Abdomen is soft.  Musculoskeletal:     Cervical back: Neck supple.  Skin:    General: Skin is warm and dry.  Capillary Refill: Capillary refill takes less than 2 seconds.  Neurological:     Mental Status: He is alert and oriented to person, place, and time.  Psychiatric:        Behavior: Behavior normal.        Thought Content: Thought content normal.        Judgment: Judgment normal.     Ortho Exam patient gets from sitting standing is able to ambulate heel-toe gait.  He is able to heel walk and toe walk slight balance problem but no weakness anterior tib gastrocsoleus no lower extremity atrophy.  Good knee range of motion.  Specialty Comments:  No specialty comments available.  Imaging: No results found.   PMFS History: Patient Active Problem List   Diagnosis Date Noted   Lung nodule 09/08/2017   Cough 03/25/2017   COPD, severe 01/22/2017   Asbestos exposure 12/13/2016   Unstable angina    Chest pain 08/25/2016   Neurological complaint 08/25/2016   Hyperlipidemia  LDL goal <130 12/21/2015   Neuropathy, peripheral 06/19/2015   Substance induced mood disorder 02/14/2012   Polysubstance dependence 02/12/2012   Sleep difficulties 02/11/2012   Anxiety 01/27/2012   Benign hypertension 01/27/2012   Opiate dependence 01/27/2012   Benzodiazepine dependence 01/27/2012   Dental caries 01/27/2012   TOBACCO DEPENDENCE 09/04/2006   MIGRAINE, UNSPEC., W/O INTRACTABLE MIGRAINE 09/04/2006   Fibromyalgia 09/04/2006   Past Medical History:  Diagnosis Date   Anemia 06/18/2012   Anxiety    Arthritis    CAD (coronary artery disease)    Mild to moderate nonobstructive CAD at cardiac catheterization 2018   COPD (chronic obstructive pulmonary disease)    Essential hypertension    Fibromyalgia    GERD (gastroesophageal reflux disease)    History of kidney stones    Opiate use    Palpitations    Peripheral neuropathy    Pulmonary nodule    Vitamin B 12 deficiency     Family History  Problem Relation Age of Onset   Fibromyalgia Mother    Bladder Cancer Father [id number]   Benign prostatic hyperplasia Father    Healthy Son    Heart failure Paternal Grandmother    Heart failure Paternal Grandfather    Lung cancer Paternal Grandfather    Lung cancer Maternal Uncle    Emphysema Maternal Uncle    Lung cancer Paternal Uncle    Lung cancer Maternal Grandmother    Stomach cancer Paternal Uncle     Past Surgical History:  Procedure Laterality Date   COLONOSCOPY     found to have 1 polyp   ESOPHAGEAL DILATION  1993   LEFT HEART CATH AND CORONARY ANGIOGRAPHY N/A 12/09/2016   Procedure: Left Heart Cath and Coronary Angiography;  Surgeon: Yvonne KendallEnd, Christopher, MD;  Location: [facility];  Service: Cardiovascular;  Laterality: N/A;   TONSILLECTOMY     VIDEO ASSISTED THORACOSCOPY (VATS)/WEDGE RESECTION Left 09/08/2017   Procedure: VIDEO ASSISTED THORACOSCOPY (VATS)/WEDGE RESECTION;  Surgeon: Loreli SlotHendrickson, Steven C, MD;  Location: [facility] OR;  Service: Thoracic;  Laterality:  Left;   Social History   Occupational History   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

## 2022-10-14 ENCOUNTER — Ambulatory Visit: Payer: BC Managed Care – PPO | Admitting: Orthopaedic Surgery

## 2022-10-15 ENCOUNTER — Ambulatory Visit: Payer: BC Managed Care – PPO | Admitting: Orthopaedic Surgery

## 2022-10-22 DIAGNOSIS — Z713 Dietary counseling and surveillance: Secondary | ICD-10-CM | POA: Diagnosis not present

## 2022-10-22 DIAGNOSIS — N451 Epididymitis: Secondary | ICD-10-CM | POA: Diagnosis not present

## 2022-10-22 DIAGNOSIS — Z6828 Body mass index (BMI) 28.0-28.9, adult: Secondary | ICD-10-CM | POA: Diagnosis not present

## 2022-10-22 DIAGNOSIS — I10 Essential (primary) hypertension: Secondary | ICD-10-CM | POA: Diagnosis not present

## 2022-10-30 ENCOUNTER — Other Ambulatory Visit (HOSPITAL_COMMUNITY): Payer: Self-pay | Admitting: Internal Medicine

## 2022-10-30 DIAGNOSIS — N5082 Scrotal pain: Secondary | ICD-10-CM

## 2022-11-12 ENCOUNTER — Ambulatory Visit (HOSPITAL_COMMUNITY): Admission: RE | Admit: 2022-11-12 | Payer: BC Managed Care – PPO | Source: Ambulatory Visit

## 2022-11-13 ENCOUNTER — Ambulatory Visit (HOSPITAL_COMMUNITY)
Admission: RE | Admit: 2022-11-13 | Discharge: 2022-11-13 | Disposition: A | Discharge status: Home / Self Care | Payer: BC Managed Care – PPO | Source: Ambulatory Visit | Attending: Internal Medicine | Admitting: Internal Medicine

## 2022-11-13 DIAGNOSIS — N433 Hydrocele, unspecified: Secondary | ICD-10-CM | POA: Diagnosis not present

## 2022-11-13 DIAGNOSIS — N5082 Scrotal pain: Secondary | ICD-10-CM | POA: Insufficient documentation

## 2022-12-26 DIAGNOSIS — N451 Epididymitis: Secondary | ICD-10-CM | POA: Diagnosis not present

## 2022-12-26 DIAGNOSIS — I10 Essential (primary) hypertension: Secondary | ICD-10-CM | POA: Diagnosis not present

## 2023-01-04 DIAGNOSIS — F17200 Nicotine dependence, unspecified, uncomplicated: Secondary | ICD-10-CM | POA: Diagnosis not present

## 2023-01-04 DIAGNOSIS — I10 Essential (primary) hypertension: Secondary | ICD-10-CM | POA: Diagnosis not present

## 2023-01-04 DIAGNOSIS — M609 Myositis, unspecified: Secondary | ICD-10-CM | POA: Diagnosis not present

## 2023-01-04 DIAGNOSIS — M5126 Other intervertebral disc displacement, lumbar region: Secondary | ICD-10-CM | POA: Diagnosis not present

## 2023-01-04 DIAGNOSIS — E782 Mixed hyperlipidemia: Secondary | ICD-10-CM | POA: Diagnosis not present

## 2023-01-08 ENCOUNTER — Encounter: Payer: Self-pay | Admitting: Urology

## 2023-01-08 ENCOUNTER — Ambulatory Visit (INDEPENDENT_AMBULATORY_CARE_PROVIDER_SITE_OTHER): Payer: BC Managed Care – PPO | Admitting: Urology

## 2023-01-08 VITALS — BP 135/87 | HR 83

## 2023-01-08 DIAGNOSIS — N503 Cyst of epididymis: Secondary | ICD-10-CM

## 2023-01-08 DIAGNOSIS — N5082 Scrotal pain: Secondary | ICD-10-CM

## 2023-01-08 MED ORDER — DOXYCYCLINE HYCLATE 100 MG PO CAPS: 100.0000 mg | ORAL_CAPSULE | Freq: Two times a day (BID) | ORAL | 0 refills | Status: DC

## 2023-01-08 MED ORDER — ALFUZOSIN HCL ER 10 MG PO TB24: 10.0000 mg | ORAL_TABLET | Freq: Every day | ORAL | 11 refills | Status: DC

## 2023-01-08 NOTE — Progress Notes (Signed)
01/08/2023 11:44 AM   Mathew Brady 1966-04-16 [id number]  Referring provider: Benita Stabile, MD [address] [place],  [place] [address]  No chief complaint on file.   HPI: Mr Mathew Brady is a 56yo here for evaluation of left scrotal pain. He has noted a left testicular mass that is painful for palpation. He has pain with ambulation. Scrotal US 11/2022 which showed a 3mm left epididymal cyst. He takes percocet for chronic back pain. IPSS 32 QOl 6 on no therapy.    PMH: Past Medical History:  Diagnosis Date   Anemia 06/18/2012   Anxiety    Arthritis    CAD (coronary artery disease)    Mild to moderate nonobstructive CAD at cardiac catheterization 2018   COPD (chronic obstructive pulmonary disease) (HCC)    Essential hypertension    Fibromyalgia    GERD (gastroesophageal reflux disease)    History of kidney stones    Opiate use    Palpitations    Peripheral neuropathy    Pulmonary nodule    Vitamin B 12 deficiency     Surgical History: Past Surgical History:  Procedure Laterality Date   COLONOSCOPY     found to have 1 polyp   ESOPHAGEAL DILATION  1993   LEFT HEART CATH AND CORONARY ANGIOGRAPHY N/A 12/09/2016   Procedure: Left Heart Cath and Coronary Angiography;  Surgeon: Mathew Kendall, MD;  Location: [facility];  Service: Cardiovascular;  Laterality: N/A;   TONSILLECTOMY     VIDEO ASSISTED THORACOSCOPY (VATS)/WEDGE RESECTION Left 09/08/2017   Procedure: VIDEO ASSISTED THORACOSCOPY (VATS)/WEDGE RESECTION;  Surgeon: Mathew Slot, MD;  Location: [facility] OR;  Service: Thoracic;  Laterality: Left;    Home Medications:  Allergies as of 01/08/2023       Reactions   Succinylcholine Anaphylaxis   Pregabalin Other (See Comments), Photosensitivity        Medication List        Accurate as of January 08, 2023 11:44 AM. If you have any questions, ask your nurse or doctor.          acetaminophen 500 MG tablet Commonly known as: TYLENOL Take 2 tablets (1,000 mg  total) by mouth every 6 (six) hours.   albuterol 108 (90 Base) MCG/ACT inhaler Commonly known as: VENTOLIN HFA Inhale 2 puffs into the lungs every 4 (four) hours as needed for wheezing or shortness of breath.   ALPRAZolam 0.5 MG tablet Commonly known as: XANAX Take 0.5 mg by mouth at bedtime as needed for anxiety or sleep.   amLODipine 10 MG tablet Commonly known as: NORVASC Take 1 tablet by mouth daily.   aspirin EC 81 MG tablet Take 1 tablet (81 mg total) by mouth daily.   budesonide 0.25 MG/2ML nebulizer solution Commonly known as: PULMICORT Take 2 mLs (0.25 mg total) by nebulization 2 (two) times daily.   Fluticasone-Umeclidin-Vilant 100-62.5-25 MCG/INH Aepb Commonly known as: Trelegy Ellipta Inhale 1 puff into the lungs daily.   Trelegy Ellipta 100-62.5-25 MCG/ACT Aepb Generic drug: Fluticasone-Umeclidin-Vilant Inhale 1 puff into the lungs daily.   gabapentin 800 MG tablet Commonly known as: NEURONTIN TAKE 1 TABLET BY MOUTH THREE TIMES A DAY   lidocaine 5 % Commonly known as: Lidoderm Place 1 patch onto the skin daily. Remove & Discard patch within 12 hours or as directed by MD   lisinopril 5 MG tablet Commonly known as: ZESTRIL Take 1 tablet (5 mg total) by mouth daily.   methocarbamol 500 MG tablet Commonly  known as: ROBAXIN Take 2 tablets (1,000 mg total) by mouth every 8 (eight) hours as needed for muscle spasms.   metoprolol succinate 25 MG 24 hr tablet Commonly known as: TOPROL-XL Take 25 mg by mouth daily as needed (high blood pressure).   Oxycodone HCl 10 MG Tabs Take 1 tablet (10 mg total) by mouth every 6 (six) hours as needed.   oxyCODONE-acetaminophen 10-325 MG tablet Commonly known as: PERCOCET Take by mouth.   oxyCODONE-acetaminophen 10-325 MG tablet Commonly known as: PERCOCET Take 1 tablet by mouth every 6 (six) hours.   oxyCODONE-acetaminophen 10-325 MG tablet Commonly known as: Percocet Take 1 tablet by mouth every 4 (four) hours as  needed for pain.   pantoprazole 40 MG tablet Commonly known as: PROTONIX Take 40 mg by mouth daily as needed (indigestion).   predniSONE 20 MG tablet Commonly known as: DELTASONE 3 tabs po day one, then 2 po daily x 4 days   traMADol 50 MG tablet Commonly known as: ULTRAM 1 tab PO BID PRN for BREAKTHROUGH PAIN for 7 days        Allergies:  Allergies  Allergen Reactions   Succinylcholine Anaphylaxis   Pregabalin Other (See Comments) and Photosensitivity    Family History: Family History  Problem Relation Age of Onset   Fibromyalgia Mother    Bladder Cancer Father 16   Benign prostatic hyperplasia Father    Healthy Son    Heart failure Paternal Grandmother    Heart failure Paternal Grandfather    Lung cancer Paternal Grandfather    Lung cancer Maternal Uncle    Emphysema Maternal Uncle    Lung cancer Paternal Uncle    Lung cancer Maternal Grandmother    Stomach cancer Paternal Uncle     Social History:  reports that he has been smoking cigarettes. He started smoking about 43 years ago. He has a 35.00 pack-year smoking history. He has never used smokeless tobacco. He reports that he does not drink alcohol and does not use drugs.  ROS: All other review of systems were reviewed and are negative except what is noted above in HPI  Physical Exam: BP 135/87   Pulse 83   Constitutional:  Alert and oriented, No acute distress. HEENT: [place] AT, moist mucus membranes.  Trachea midline, no masses. Cardiovascular: No clubbing, cyanosis, or edema. Respiratory: Normal respiratory effort, no increased work of breathing. GI: Abdomen is soft, nontender, nondistended, no abdominal masses GU: No CVA tenderness. Circumcised phallus. No masses/lesions on penis, testis, scrotum.  Lymph: No cervical or inguinal lymphadenopathy. Skin: No rashes, bruises or suspicious lesions. Neurologic: Grossly intact, no focal deficits, moving all 4 extremities. Psychiatric: Normal mood and  affect.  Laboratory Data: Lab Results  Component Value Date   WBC 8.0 12/14/2021   HGB 15.5 12/14/2021   HCT 45.3 12/14/2021   MCV 94.6 12/14/2021   PLT 342 12/14/2021    Lab Results  Component Value Date   CREATININE 1.05 09/09/2017    No results found for: "PSA"  No results found for: "TESTOSTERONE"  Lab Results  Component Value Date   HGBA1C 5.5 06/05/2015    Urinalysis    Component Value Date/Time   COLORURINE YELLOW 03/26/2018 1330   APPEARANCEUR CLEAR 03/26/2018 1330   LABSPEC 1.010 03/26/2018 1330   PHURINE 6.0 03/26/2018 1330   GLUCOSEU NEGATIVE 03/26/2018 1330   HGBUR NEGATIVE 03/26/2018 1330   BILIRUBINUR NEGATIVE 03/26/2018 1330   KETONESUR NEGATIVE 03/26/2018 1330   PROTEINUR NEGATIVE 03/26/2018 1330  UROBILINOGEN 0.2 04/20/2014 1141   NITRITE NEGATIVE 03/26/2018 1330   LEUKOCYTESUR NEGATIVE 03/26/2018 1330    No results found for: "LABMICR", "WBCUA", "RBCUA", "LABEPIT", "MUCUS", "BACTERIA"  Pertinent Imaging:  No results found for this or any previous visit.  No results found for this or any previous visit.  No results found for this or any previous visit.  No results found for this or any previous visit.  No results found for this or any previous visit.  No valid procedures specified. No results found for this or any previous visit.  Results for orders placed during the hospital encounter of 05/18/19  CT RENAL STONE STUDY  Narrative CLINICAL DATA:  Right-sided flank pain for several months  EXAM: CT ABDOMEN AND PELVIS WITHOUT CONTRAST  TECHNIQUE: Multidetector CT imaging of the abdomen and pelvis was performed following the standard protocol without IV contrast.  COMPARISON:  08/25/2016  FINDINGS: Lower chest: No acute abnormality.  Hepatobiliary: No focal liver abnormality is seen. No gallstones, gallbladder wall thickening, or biliary dilatation.  Pancreas: Unremarkable. No pancreatic ductal dilatation or surrounding  inflammatory changes.  Spleen: Normal in size without focal abnormality.  Adrenals/Urinary Tract: Adrenal glands are within normal limits. Kidneys are well visualized bilaterally. A tiny nonobstructing left renal stone is noted. No obstructive changes are seen. The bladder is partially distended. Vague hypodensity is noted within the right kidney best seen on image number 32 of series 2. This likely represents a small cyst.  Stomach/Bowel: Scattered diverticular change of the colon is noted. No findings to suggest diverticulitis are seen. No obstructive changes are noted. The appendix is within normal limits. No small bowel or gastric abnormality is seen.  Vascular/Lymphatic: Aortic atherosclerosis. No enlarged abdominal or pelvic lymph nodes.  Reproductive: Prostate is unremarkable.  Other: No abdominal wall hernia or abnormality. No abdominopelvic ascites.  Musculoskeletal: No acute or significant osseous findings.  IMPRESSION: Tiny nonobstructing left renal stone.  Likely right renal cyst although incompletely evaluated on this noncontrast study.  Diverticulosis without diverticulitis.   Electronically Signed By: Alcide Clever M.D. On: 05/18/2019 22:22   Assessment & Plan:    1. Chronic prostatitis -uroxatral 10mg  at bedtime -doxycycline 100mg  BID for 3 weeks - Urinalysis, Routine w reflex microscopic - BLADDER SCAN AMB NON-IMAGING   No follow-ups on file.  Wilkie Aye, MD  [facility]

## 2023-01-08 NOTE — Patient Instructions (Signed)

## 2023-01-08 NOTE — Progress Notes (Signed)
post void residual=10 °

## 2023-01-24 DIAGNOSIS — J441 Chronic obstructive pulmonary disease with (acute) exacerbation: Secondary | ICD-10-CM | POA: Diagnosis not present

## 2023-01-24 DIAGNOSIS — R110 Nausea: Secondary | ICD-10-CM | POA: Diagnosis not present

## 2023-03-12 ENCOUNTER — Ambulatory Visit: Payer: BC Managed Care – PPO | Admitting: Urology

## 2023-06-29 ENCOUNTER — Ambulatory Visit
Admission: EM | Admit: 2023-06-29 | Discharge: 2023-06-29 | Disposition: A | Discharge status: Home / Self Care | Payer: Medicaid Other | Attending: Nurse Practitioner | Admitting: Nurse Practitioner

## 2023-06-29 ENCOUNTER — Ambulatory Visit (INDEPENDENT_AMBULATORY_CARE_PROVIDER_SITE_OTHER): Payer: Medicaid Other

## 2023-06-29 ENCOUNTER — Encounter: Payer: Self-pay | Admitting: Emergency Medicine

## 2023-06-29 DIAGNOSIS — W19XXXA Unspecified fall, initial encounter: Secondary | ICD-10-CM | POA: Diagnosis not present

## 2023-06-29 DIAGNOSIS — M25511 Pain in right shoulder: Secondary | ICD-10-CM

## 2023-06-29 MED ORDER — CYCLOBENZAPRINE HCL 5 MG PO TABS: 5.0000 mg | ORAL_TABLET | Freq: Three times a day (TID) | ORAL | 0 refills | Status: DC | PRN

## 2023-06-29 NOTE — ED Triage Notes (Signed)
Fell on Friday and landed on right shoulder.  Hurts to raise arm.

## 2023-06-29 NOTE — Discharge Instructions (Addendum)
The x-ray of the right shoulder was negative for fracture or dislocation.  As discussed, if you continue to experience shoulder pain or trouble moving her shoulder, you most likely will need an MRI to determine if there is further damage. Take medication as prescribed. Increase fluids and allow for plenty of rest. May take over-the-counter Tylenol arthritis strength 650 mg tablets as needed for pain or discomfort.   Continue the use of ice or heat.  Apply ice for pain or swelling, heat for spasm or stiffness.  Apply for 20 minutes, remove for 1 hour, repeat as needed. As discussed, if symptoms have not improved over the next 1 to 2 weeks, or there is worsening, would like for you to follow-up with orthopedics for further evaluation. Follow-up as needed.

## 2023-06-29 NOTE — ED Provider Notes (Signed)
[facility]    CSN: [id number] Arrival date & time: 06/29/23  1505      History   Chief Complaint No chief complaint on file.   HPI Mathew Brady is a 57 y.o. male.   The history is provided by the patient.   Patient presents for complaints of right shoulder pain has been present for the past 2 days.  Patient states he was out in his barn trying to move a tarp, when he slipped and landed directly onto the right shoulder.  Since that time, he has noticed pain, decreased range of motion, and mild swelling of the right shoulder.  He states that the pain does radiate down into the right upper arm.  He denies numbness or tingling.  Patient complains of pain with any shoulder movement.  Reports he has been applying ice to the right shoulder.  Denies any prior shoulder injury.  Past Medical History:  Diagnosis Date   Anemia 06/18/2012   Anxiety    Arthritis    CAD (coronary artery disease)    Mild to moderate nonobstructive CAD at cardiac catheterization 2018   COPD (chronic obstructive pulmonary disease) (HCC)    Essential hypertension    Fibromyalgia    GERD (gastroesophageal reflux disease)    History of kidney stones    Opiate use    Palpitations    Peripheral neuropathy    Pulmonary nodule    Vitamin B 12 deficiency     Patient Active Problem List   Diagnosis Date Noted   Protrusion of lumbar intervertebral disc 10/11/2022   Lung nodule 09/08/2017   Cough 03/25/2017   COPD, severe (HCC) 01/22/2017   Asbestos exposure 12/13/2016   Unstable angina (HCC)    Chest pain 08/25/2016   Neurological complaint 08/25/2016   Hyperlipidemia LDL goal <130 12/21/2015   Neuropathy, peripheral 06/19/2015   Substance induced mood disorder (HCC) 02/14/2012   Polysubstance dependence (HCC) 02/12/2012   Sleep difficulties 02/11/2012   Anxiety 01/27/2012   Benign hypertension 01/27/2012   Opiate dependence (HCC) 01/27/2012   Benzodiazepine dependence (HCC) 01/27/2012    Dental caries 01/27/2012   TOBACCO DEPENDENCE 09/04/2006   MIGRAINE, UNSPEC., W/O INTRACTABLE MIGRAINE 09/04/2006   Fibromyalgia 09/04/2006    Past Surgical History:  Procedure Laterality Date   COLONOSCOPY     found to have 1 polyp   ESOPHAGEAL DILATION  1993   LEFT HEART CATH AND CORONARY ANGIOGRAPHY N/A 12/09/2016   Procedure: Left Heart Cath and Coronary Angiography;  Surgeon: Yvonne Kendall, MD;  Location: [facility];  Service: Cardiovascular;  Laterality: N/A;   TONSILLECTOMY     VIDEO ASSISTED THORACOSCOPY (VATS)/WEDGE RESECTION Left 09/08/2017   Procedure: VIDEO ASSISTED THORACOSCOPY (VATS)/WEDGE RESECTION;  Surgeon: Loreli Slot, MD;  Location: [facility] OR;  Service: Thoracic;  Laterality: Left;       Home Medications    Prior to Admission medications   Medication Sig Start Date End Date Taking? Authorizing Provider  acetaminophen (TYLENOL) 500 MG tablet Take 2 tablets (1,000 mg total) by mouth every 6 (six) hours. 03/26/18   Loren Racer, MD  albuterol (PROVENTIL HFA;VENTOLIN HFA) 108 (90 Base) MCG/ACT inhaler Inhale 2 puffs into the lungs every 4 (four) hours as needed for wheezing or shortness of breath. 03/25/17   Roslynn Amble, MD  alfuzosin (UROXATRAL) 10 MG 24 hr tablet Take 1 tablet (10 mg total) by mouth at bedtime. 01/08/23   McKenzie, Mardene Celeste, MD  ALPRAZolam Prudy Feeler) 0.5 MG  tablet Take 0.5 mg by mouth at bedtime as needed for anxiety or sleep.  08/13/17   [provider]  amLODipine (NORVASC) 10 MG tablet Take 1 tablet by mouth daily.    [provider]  aspirin EC 81 MG tablet Take 1 tablet (81 mg total) by mouth daily. 11/28/16   Dunn, Tacey Ruiz, PA-C  Fluticasone-Umeclidin-Vilant (TRELEGY ELLIPTA) 100-62.5-25 MCG/ACT AEPB Inhale 1 puff into the lungs daily. 12/14/21   Omar Person, MD  gabapentin (NEURONTIN) 800 MG tablet TAKE 1 TABLET BY MOUTH THREE TIMES A DAY 02/05/17   Dettinger, Elige Radon, MD  oxyCODONE-acetaminophen (PERCOCET)  10-325 MG tablet Take 1 tablet by mouth every 6 (six) hours.  03/05/18   [provider]  oxyCODONE-acetaminophen (PERCOCET) 10-325 MG tablet Take by mouth.    [provider]  oxyCODONE-acetaminophen (PERCOCET) 10-325 MG tablet Take 1 tablet by mouth every 4 (four) hours as needed for pain. 09/01/22   Kommor, Wyn Forster, MD    Family History Family History  Problem Relation Age of Onset   Fibromyalgia Mother    Bladder Cancer Father 71   Benign prostatic hyperplasia Father    Healthy Son    Heart failure Paternal Grandmother    Heart failure Paternal Grandfather    Lung cancer Paternal Grandfather    Lung cancer Maternal Uncle    Emphysema Maternal Uncle    Lung cancer Paternal Uncle    Lung cancer Maternal Grandmother    Stomach cancer Paternal Uncle     Social History Social History   Tobacco Use   Smoking status: Every Day    Current packs/day: 0.00    Average packs/day: 1 pack/day for 36.0 years (36.0 ttl pk-yrs)    Types: Cigarettes    Start date: 03/12/1979    Last attempt to quit: 03/09/2015    Years since quitting: 8.3   Smokeless tobacco: Never   Tobacco comments:    1ppd 12/14/21  Vaping Use   Vaping status: Every Day   Start date: 10/11/2016  Substance Use Topics   Alcohol use: No    Alcohol/week: 0.0 standard drinks of alcohol   Drug use: No     Allergies   Succinylcholine and Pregabalin   Review of Systems Review of Systems Per HPI  Physical Exam Triage Vital Signs ED Triage Vitals  Encounter Vitals Group     BP 06/29/23 1516 139/84     Systolic BP Percentile --      Diastolic BP Percentile --      Pulse Rate 06/29/23 1516 76     Resp 06/29/23 1516 18     Temp 06/29/23 1516 97.6 F (36.4 C)     Temp Source 06/29/23 1516 Oral     SpO2 06/29/23 1516 96 %     Weight --      Height --      Head Circumference --      Peak Flow --      Pain Score 06/29/23 1517 10     Pain Loc --      Pain Education --      Exclude from Growth  Chart --    No data found.  Updated Vital Signs BP 139/84 (BP Location: Left Arm)   Pulse 76   Temp 97.6 F (36.4 C) (Oral)   Resp 18   SpO2 96%   Visual Acuity Right Eye Distance:   Left Eye Distance:   Bilateral Distance:    Right Eye Near:  Left Eye Near:    Bilateral Near:     Physical Exam Vitals and nursing note reviewed.  Constitutional:      General: He is not in acute distress.    Appearance: Normal appearance.  HENT:     Head: Normocephalic.  Eyes:     Extraocular Movements: Extraocular movements intact.     Pupils: Pupils are equal, round, and reactive to light.  Musculoskeletal:     Right shoulder: Tenderness present. No swelling or deformity. Decreased range of motion (In all planes). Decreased strength. Normal pulse.     Cervical back: Normal range of motion.     Comments: Generalized tenderness noted to the right shoulder to include anterior, posterior, and lateral shoulder region.  There is no obvious bruising noted.  Patient does have mild swelling noted.  Decreased range of motion in all planes.  Decreased grip strength noted on exam due to shoulder pain.  Lymphadenopathy:     Cervical: No cervical adenopathy.  Skin:    General: Skin is warm and dry.  Neurological:     General: No focal deficit present.     Mental Status: He is alert and oriented to person, place, and time.  Psychiatric:        Mood and Affect: Mood normal.        Behavior: Behavior normal.      UC Treatments / Results  Labs (all labs ordered are listed, but only abnormal results are displayed) Labs Reviewed - No data to display  EKG   Radiology DG Shoulder Right Result Date: 06/29/2023 CLINICAL DATA:  Fall and landed on the right shoulder, now with shoulder pain. EXAM: RIGHT SHOULDER - 2+ VIEW COMPARISON:  Chest radiograph dated 12/24/2021 FINDINGS: There is no evidence of fracture or dislocation. Mild acromioclavicular joint osteoarthritis. Soft tissues are  unremarkable. IMPRESSION: No acute fracture or dislocation. Electronically Signed   By: Romona Curls M.D.   On: 06/29/2023 15:39    Procedures Procedures (including critical care time)  Medications Ordered in UC Medications - No data to display  Initial Impression / Assessment and Plan / UC Course  I have reviewed the triage vital signs and the nursing notes.  Pertinent labs & imaging results that were available during my care of the patient were reviewed by me and considered in my medical decision making (see chart for details).  X-ray of the right shoulder is negative for fracture or dislocation.  Suspect patient does have a contusion of the right shoulder.  He complains of pain with movement in all planes, difficult to perform a thorough shoulder exam given his level of discomfort.  Will prescribe cyclobenzaprine 5 mg for shoulder pain.  Supportive care recommendations were provided and discussed with the patient to include over-the-counter analgesics, and RICE therapy.  Patient was advised regarding when it may be indicated that he follow-up with orthopedics.  Patient was given information for [facility] of [facility] and for [facility].  Patient was in agreement with this plan of care and verbalizes understanding.  All questions were answered.  Patient stable for discharge.   Final Clinical Impressions(s) / UC Diagnoses   Final diagnoses:  None   Discharge Instructions   None    ED Prescriptions   None    PDMP not reviewed this encounter.   Abran Cantor, NP 06/29/23 [phone]

## 2023-07-30 ENCOUNTER — Other Ambulatory Visit (HOSPITAL_COMMUNITY): Payer: Self-pay | Admitting: Family Medicine

## 2023-07-30 ENCOUNTER — Other Ambulatory Visit (HOSPITAL_COMMUNITY): Payer: Self-pay | Admitting: Otolaryngology

## 2023-07-30 DIAGNOSIS — M25511 Pain in right shoulder: Secondary | ICD-10-CM

## 2023-07-30 DIAGNOSIS — R29898 Other symptoms and signs involving the musculoskeletal system: Secondary | ICD-10-CM

## 2023-07-31 ENCOUNTER — Encounter (HOSPITAL_COMMUNITY): Payer: Self-pay

## 2023-07-31 ENCOUNTER — Other Ambulatory Visit: Payer: Self-pay

## 2023-07-31 ENCOUNTER — Emergency Department (HOSPITAL_COMMUNITY): Payer: Medicaid Other

## 2023-07-31 ENCOUNTER — Observation Stay (HOSPITAL_COMMUNITY)
Admission: EM | Admit: 2023-07-31 | Discharge: 2023-08-01 | Disposition: A | Discharge status: Home / Self Care | Payer: Medicaid Other

## 2023-07-31 ENCOUNTER — Ambulatory Visit (HOSPITAL_COMMUNITY): Admission: RE | Admit: 2023-07-31 | Payer: Medicaid Other | Source: Ambulatory Visit

## 2023-07-31 DIAGNOSIS — I639 Cerebral infarction, unspecified: Secondary | ICD-10-CM | POA: Diagnosis not present

## 2023-07-31 DIAGNOSIS — M25511 Pain in right shoulder: Secondary | ICD-10-CM | POA: Insufficient documentation

## 2023-07-31 DIAGNOSIS — M797 Fibromyalgia: Secondary | ICD-10-CM

## 2023-07-31 DIAGNOSIS — I2510 Atherosclerotic heart disease of native coronary artery without angina pectoris: Secondary | ICD-10-CM | POA: Insufficient documentation

## 2023-07-31 DIAGNOSIS — J449 Chronic obstructive pulmonary disease, unspecified: Secondary | ICD-10-CM | POA: Diagnosis not present

## 2023-07-31 DIAGNOSIS — I10 Essential (primary) hypertension: Secondary | ICD-10-CM | POA: Diagnosis present

## 2023-07-31 DIAGNOSIS — I6389 Other cerebral infarction: Secondary | ICD-10-CM | POA: Diagnosis not present

## 2023-07-31 DIAGNOSIS — F419 Anxiety disorder, unspecified: Secondary | ICD-10-CM | POA: Diagnosis present

## 2023-07-31 DIAGNOSIS — Z79899 Other long term (current) drug therapy: Secondary | ICD-10-CM | POA: Insufficient documentation

## 2023-07-31 DIAGNOSIS — F17210 Nicotine dependence, cigarettes, uncomplicated: Secondary | ICD-10-CM | POA: Diagnosis not present

## 2023-07-31 DIAGNOSIS — R42 Dizziness and giddiness: Secondary | ICD-10-CM | POA: Diagnosis present

## 2023-07-31 DIAGNOSIS — M5126 Other intervertebral disc displacement, lumbar region: Secondary | ICD-10-CM

## 2023-07-31 LAB — CBC
HCT: 50.1 % (ref 39.0–52.0)
Hemoglobin: 16.7 g/dL (ref 13.0–17.0)
MCH: 32.3 pg (ref 26.0–34.0)
MCHC: 33.3 g/dL (ref 30.0–36.0)
MCV: 96.9 fL (ref 80.0–100.0)
Platelets: 360 10*3/uL (ref 150–400)
RBC: 5.17 MIL/uL (ref 4.22–5.81)
RDW: 12.4 % (ref 11.5–15.5)
WBC: 8.6 10*3/uL (ref 4.0–10.5)
nRBC: 0 % (ref 0.0–0.2)

## 2023-07-31 LAB — BASIC METABOLIC PANEL
Anion gap: 14 (ref 5–15)
BUN: 9 mg/dL (ref 6–20)
CO2: 27 mmol/L (ref 22–32)
Calcium: 9.7 mg/dL (ref 8.9–10.3)
Chloride: 95 mmol/L — ABNORMAL LOW (ref 98–111)
Creatinine, Ser: 1.02 mg/dL (ref 0.61–1.24)
GFR, Estimated: >60 mL/min (ref >60)
Glucose, Bld: 71 mg/dL (ref 70–99)
Potassium: 4 mmol/L (ref 3.5–5.1)
Sodium: 136 mmol/L (ref 135–145)

## 2023-07-31 MED ORDER — MECLIZINE HCL 12.5 MG PO TABS
25.0000 mg | ORAL_TABLET | Freq: Once | ORAL | Status: DC
  Filled 2023-07-31: qty 2

## 2023-07-31 MED ORDER — GADOBUTROL 1 MMOL/ML IV SOLN
9.0000 mL | Freq: Once | INTRAVENOUS | Status: AC | PRN
  Administered 2023-07-31: 9 mL via INTRAVENOUS

## 2023-07-31 MED ORDER — AMOXICILLIN-POT CLAVULANATE 875-125 MG PO TABS
1.0000 | ORAL_TABLET | Freq: Once | ORAL | Status: AC
Start: 2023-07-31 — End: 2023-07-31
  Administered 2023-07-31: 1 via ORAL
  Filled 2023-07-31: qty 1

## 2023-07-31 NOTE — ED Notes (Signed)
Pt Ok'd by Dr. Rhae Hammock to have some food. Pt provided [employer] by his S/O.  Pt declined an IV at this time.

## 2023-07-31 NOTE — ED Triage Notes (Signed)
Pt arrived via POV c/o body tremors. Pt reports he has a CT Scan scheduled for later today. Pt reports he felt a "tingling sensation and ' burning sensation" mainly in his legs and his arms. Pt denies injuries.

## 2023-07-31 NOTE — H&P (Signed)
History and Physical    Patient: Mathew Brady YNW:[id number] DOB: 20-Aug-1965 DOA: 07/31/2023 DOS: the patient was seen and examined on 08/01/2023 PCP: Benita Stabile, MD  Patient coming from: Home  Chief Complaint:  Chief Complaint  Patient presents with   Tremors   HPI: Mathew Brady is a 58 y.o. male with medical history significant of hypertension, COPD, anxiety, chronic back pain who presents to the emergency department due to dizziness and lightheadedness which has been ongoing for about 2 weeks.  He complains of having fullness and some mild pain in his right ear, this popped while in the waiting room today with some alleviation.  He also had some tingling sensation in his extremities today which resulted in him deciding to go to the ED for further evaluation.  ED Course:  In the emergency department, BP was 160/93 and other vital signs were within normal range.  Workup in the ED showed normal CBC and BMP except for chloride of 95. CT head without contrast showed area of hypoattenuation within the right occipital lobe concerning for acute or subacute infarct MRI head without and with contrast showed evolving subacute ischemic infarct involving the right occipital lobe. Associated petechial blood products without frank hemorrhagic transformation or significant regional mass effect. Additional subcentimeter early subacute ischemic infarct involving the right frontal centrum semi ovale. Underlying mild chronic microvascular ischemic disease with remote left basal ganglia lacunar infarct. MRA head was negative for large vessel occlusion. Augmentin was given, meclizine was given. Hospitalist was asked to admit patient for further evaluation and management.   Review of Systems: Review of systems as noted in the HPI. All other systems reviewed and are negative.   Past Medical History:  Diagnosis Date   Anemia 06/18/2012   Anxiety    Arthritis    CAD (coronary artery disease)    Mild to  moderate nonobstructive CAD at cardiac catheterization 2018   COPD (chronic obstructive pulmonary disease) (HCC)    Essential hypertension    Fibromyalgia    GERD (gastroesophageal reflux disease)    History of kidney stones    Opiate use    Palpitations    Peripheral neuropathy    Pulmonary nodule    Vitamin B 12 deficiency    Past Surgical History:  Procedure Laterality Date   COLONOSCOPY     found to have 1 polyp   ESOPHAGEAL DILATION  1993   LEFT HEART CATH AND CORONARY ANGIOGRAPHY N/A 12/09/2016   Procedure: Left Heart Cath and Coronary Angiography;  Surgeon: Yvonne Kendall, MD;  Location: [facility];  Service: Cardiovascular;  Laterality: N/A;   TONSILLECTOMY     VIDEO ASSISTED THORACOSCOPY (VATS)/WEDGE RESECTION Left 09/08/2017   Procedure: VIDEO ASSISTED THORACOSCOPY (VATS)/WEDGE RESECTION;  Surgeon: Loreli Slot, MD;  Location: [facility] OR;  Service: Thoracic;  Laterality: Left;    Social History:  reports that he has been smoking cigarettes. He started smoking about 44 years ago. He has a 36 pack-year smoking history. He has never used smokeless tobacco. He reports that he does not drink alcohol and does not use drugs.   Allergies  Allergen Reactions   Succinylcholine Anaphylaxis   Pregabalin Other (See Comments) and Photosensitivity    Family History  Problem Relation Age of Onset   Fibromyalgia Mother    Bladder Cancer Father 73   Benign prostatic hyperplasia Father    Healthy Son    Heart failure Paternal Grandmother    Heart failure Paternal Grandfather  Lung cancer Paternal Grandfather    Lung cancer Maternal Uncle    Emphysema Maternal Uncle    Lung cancer Paternal Uncle    Lung cancer Maternal Grandmother    Stomach cancer Paternal Uncle      Prior to Admission medications   Medication Sig Start Date End Date Taking? Authorizing Provider  acetaminophen (TYLENOL) 500 MG tablet Take 2 tablets (1,000 mg total) by mouth every 6 (six) hours.  03/26/18  Yes Loren Racer, MD  albuterol (PROVENTIL HFA;VENTOLIN HFA) 108 (90 Base) MCG/ACT inhaler Inhale 2 puffs into the lungs every 4 (four) hours as needed for wheezing or shortness of breath. 03/25/17  Yes Roslynn Amble, MD  ALPRAZolam Prudy Feeler) 0.5 MG tablet Take 0.5 mg by mouth at bedtime as needed for anxiety or sleep.  08/13/17  Yes [provider]  gabapentin (NEURONTIN) 800 MG tablet TAKE 1 TABLET BY MOUTH THREE TIMES A DAY Patient taking differently: Take 800 mg by mouth in the morning, at noon, in the evening, and at bedtime. 02/05/17  Yes Dettinger, Elige Radon, MD  hydrochlorothiazide (HYDRODIURIL) 25 MG tablet Take 25 mg by mouth daily. 09/14/22  Yes [provider]  oxyCODONE-acetaminophen (PERCOCET) 10-325 MG tablet Take 1 tablet by mouth every 6 (six) hours.  03/05/18  Yes [provider]  amLODipine (NORVASC) 10 MG tablet Take 1 tablet by mouth daily.    [provider]    Physical Exam: BP 112/88   Pulse 76   Temp 98.2 F (36.8 C)   Resp 18   Ht 5\' 9"  (1.753 m)   Wt 85.7 kg   SpO2 96%   BMI 27.90 kg/m   General: 58 y.o. year-old male well developed well nourished in no acute distress.  Alert and oriented x3. HEENT: NCAT, EOMI Neck: Supple, trachea medial Cardiovascular: Regular rate and rhythm with no rubs or gallops.  No thyromegaly or JVD noted.  No lower extremity edema. 2/4 pulses in all 4 extremities. Respiratory: Clear to auscultation with no wheezes or rales. Good inspiratory effort. Abdomen: Soft, nontender nondistended with normal bowel sounds x4 quadrants. Muskuloskeletal: No cyanosis, clubbing or edema noted bilaterally, NIHSS 0 Neuro: CN II-XII intact, strength 5/5 x 4, sensation, reflexes intact Skin: No ulcerative lesions noted or rashes Psychiatry: Judgement and insight appear normal. Mood is appropriate for condition and setting          Labs on Admission:  Basic Metabolic Panel: Recent Labs  Lab  07/31/23 2054  NA 136  K 4.0  CL 95*  CO2 27  GLUCOSE 71  BUN 9  CREATININE 1.02  CALCIUM 9.7   Liver Function Tests: No results for input(s): "AST", "ALT", "ALKPHOS", "BILITOT", "PROT", "ALBUMIN" in the last 168 hours. No results for input(s): "LIPASE", "AMYLASE" in the last 168 hours. No results for input(s): "AMMONIA" in the last 168 hours. CBC: Recent Labs  Lab 07/31/23 2054  WBC 8.6  HGB 16.7  HCT 50.1  MCV 96.9  PLT 360   Cardiac Enzymes: No results for input(s): "CKTOTAL", "CKMB", "CKMBINDEX", "TROPONINI" in the last 168 hours.  BNP (last 3 results) No results for input(s): "BNP" in the last 8760 hours.  ProBNP (last 3 results) No results for input(s): "PROBNP" in the last 8760 hours.  CBG: No results for input(s): "GLUCAP" in the last 168 hours.  Radiological Exams on Admission: MR ANGIO HEAD WO CONTRAST Result Date: 07/31/2023 CLINICAL DATA:  Initial evaluation for neuro deficit, stroke suspected. EXAM: MRI HEAD WITHOUT AND  WITH CONTRAST MRA HEAD WITHOUT CONTRAST TECHNIQUE: Multiplanar, multi-echo pulse sequences of the brain and surrounding structures were acquired without and with intravenous contrast. Angiographic images of the Circle of Willis were acquired using MRA technique without intravenous contrast. CONTRAST:  9mL GADAVIST GADOBUTROL 1 MMOL/ML IV SOLN COMPARISON:  CT from earlier the same day. FINDINGS: MRI HEAD FINDINGS Brain: Cerebral volume within normal limits. Mild chronic microvascular ischemic disease for age. Remote lacunar infarct noted at the left basal ganglia. Area of evolving encephalomalacia and gliosis noted involving the right occipital lobe. Associated diffusion signal abnormality at this level without ADC correlate. Heterogeneous postcontrast enhancement. Finding consistent with an evolving subacute ischemic infarct. Associated petechial blood products at this location without frank hemorrhagic transformation or significant regional mass  effect. An additional subcentimeter focus of diffusion signal abnormality noted at the right frontal centrum semi ovale superiorly (series 5, image 27), consistent with an early subacute ischemic infarct. Minimal associated petechial blood product noted at this location as well without mass effect. Gray-white matter differentiation otherwise maintained. No other acute or chronic intracranial blood products. No mass lesion or midline shift. No hydrocephalus or extra-axial fluid collection. Pituitary gland suprasellar region within normal limits. No abnormal enhancement. Vascular: Major intracranial vascular flow voids are maintained. Skull and upper cervical spine: Craniocervical junctional limits. Bone marrow signal intensity normal. Localized thickening of the left parietal calvarium without discrete lesion noted. No scalp soft tissue abnormality. Sinuses/Orbits: Globes orbital soft tissues within normal limits. Paranasal sinuses are largely clear. No significant mastoid effusion. Other: None. MRA HEAD FINDINGS Anterior circulation: Posterior circulation: Both V4 segments patent without stenosis. Both PICA patent. Basilar patent without stenosis. Superior cerebral arteries patent bilaterally. Both PCAs primarily supplied via the basilar. PCAs are patent to their distal aspects without significant stenosis. Anatomic variants: None significant.  No aneurysm. IMPRESSION: MRI HEAD: 1. Evolving subacute ischemic infarct involving the right occipital lobe. Associated petechial blood products without frank hemorrhagic transformation or significant regional mass effect. 2. Additional subcentimeter early subacute ischemic infarct involving the right frontal centrum semi ovale. 3. Underlying mild chronic microvascular ischemic disease with remote left basal ganglia lacunar infarct. MRA HEAD: 1. Negative intracranial MRA for large vessel occlusion. 2. Focal severe proximal right M2 stenosis, inferior division. 3. No other  hemodynamically significant or correctable stenosis about the intracranial arterial circulation. Electronically Signed   By: Rise Mu M.D.   On: 07/31/2023 22:57   MR BRAIN W WO CONTRAST Result Date: 07/31/2023 CLINICAL DATA:  Initial evaluation for neuro deficit, stroke suspected. EXAM: MRI HEAD WITHOUT AND WITH CONTRAST MRA HEAD WITHOUT CONTRAST TECHNIQUE: Multiplanar, multi-echo pulse sequences of the brain and surrounding structures were acquired without and with intravenous contrast. Angiographic images of the Circle of Willis were acquired using MRA technique without intravenous contrast. CONTRAST:  9mL GADAVIST GADOBUTROL 1 MMOL/ML IV SOLN COMPARISON:  CT from earlier the same day. FINDINGS: MRI HEAD FINDINGS Brain: Cerebral volume within normal limits. Mild chronic microvascular ischemic disease for age. Remote lacunar infarct noted at the left basal ganglia. Area of evolving encephalomalacia and gliosis noted involving the right occipital lobe. Associated diffusion signal abnormality at this level without ADC correlate. Heterogeneous postcontrast enhancement. Finding consistent with an evolving subacute ischemic infarct. Associated petechial blood products at this location without frank hemorrhagic transformation or significant regional mass effect. An additional subcentimeter focus of diffusion signal abnormality noted at the right frontal centrum semi ovale superiorly (series 5, image 27), consistent with an early subacute ischemic infarct. Minimal  associated petechial blood product noted at this location as well without mass effect. Gray-white matter differentiation otherwise maintained. No other acute or chronic intracranial blood products. No mass lesion or midline shift. No hydrocephalus or extra-axial fluid collection. Pituitary gland suprasellar region within normal limits. No abnormal enhancement. Vascular: Major intracranial vascular flow voids are maintained. Skull and upper  cervical spine: Craniocervical junctional limits. Bone marrow signal intensity normal. Localized thickening of the left parietal calvarium without discrete lesion noted. No scalp soft tissue abnormality. Sinuses/Orbits: Globes orbital soft tissues within normal limits. Paranasal sinuses are largely clear. No significant mastoid effusion. Other: None. MRA HEAD FINDINGS Anterior circulation: Posterior circulation: Both V4 segments patent without stenosis. Both PICA patent. Basilar patent without stenosis. Superior cerebral arteries patent bilaterally. Both PCAs primarily supplied via the basilar. PCAs are patent to their distal aspects without significant stenosis. Anatomic variants: None significant.  No aneurysm. IMPRESSION: MRI HEAD: 1. Evolving subacute ischemic infarct involving the right occipital lobe. Associated petechial blood products without frank hemorrhagic transformation or significant regional mass effect. 2. Additional subcentimeter early subacute ischemic infarct involving the right frontal centrum semi ovale. 3. Underlying mild chronic microvascular ischemic disease with remote left basal ganglia lacunar infarct. MRA HEAD: 1. Negative intracranial MRA for large vessel occlusion. 2. Focal severe proximal right M2 stenosis, inferior division. 3. No other hemodynamically significant or correctable stenosis about the intracranial arterial circulation. Electronically Signed   By: Rise Mu M.D.   On: 07/31/2023 22:57   CT Head Wo Contrast Result Date: 07/31/2023 CLINICAL DATA:  Memory loss, body tremors, tingling and burning sensation in legs and arms. EXAM: CT HEAD WITHOUT CONTRAST TECHNIQUE: Contiguous axial images were obtained from the base of the skull through the vertex without intravenous contrast. RADIATION DOSE REDUCTION: This exam was performed according to the departmental dose-optimization program which includes automated exposure control, adjustment of the mA and/or kV according  to patient size and/or use of iterative reconstruction technique. COMPARISON:  CT 02/22/2017 FINDINGS: Brain: Area of hypoattenuation within the right occipital lobe (series 2/image 17) concerning for acute or subacute infarct. No intracranial hemorrhage. No hydrocephalus. No mass effect. Basal cisterns are patent. Vascular: No hyperdense vessel or unexpected calcification. Skull: Normal. Negative for fracture or focal lesion. Sinuses/Orbits: No acute finding. Other: None. IMPRESSION: Area of hypoattenuation within the right occipital lobe concerning for acute or subacute infarct. MRI is recommended for further evaluation. Critical Value/emergent results were called by telephone at the time of interpretation on 07/31/2023 at 8:53 pm to provider ANDREW TEE , who verbally acknowledged these results. Electronically Signed   By: Minerva Fester M.D.   On: 07/31/2023 21:05    EKG: I independently viewed the EKG done and my findings are as followed: Normal sinus rhythm at a rate of 75 bpm with RBBB and LAFB  Assessment/Plan Present on Admission:  Acute ischemic stroke (HCC)  Essential hypertension  COPD (chronic obstructive pulmonary disease) (HCC)  Anxiety  Active Problems:   Essential hypertension   Anxiety   COPD (chronic obstructive pulmonary disease) (HCC)   Acute ischemic stroke (HCC)  Acute ischemic stroke MRI of head was indicative of subacute ischemic stroke Patient will be admitted to telemetry unit  MRA head was negative for large vessel occlusion. Echocardiogram in the morning Continue aspirin  Continue fall precautions and neuro checks Lipid panel and hemoglobin A1c will be checked Continue PT/OT eval and treat Bedside swallow eval by nursing prior to diet with consideration for SLP if patient fails these  Tele neurology will be consulted and we shall await further recommendations  Essential hypertension Patient's symptoms has been ongoing for about 2 weeks and MRI indicates  subacute ischemic stroke, also permissive hypertension does not apply here Continue home amlodipine  COPD (not in acute exacerbation) Continue albuterol  Anxiety Continue Xanax per home regimen   DVT prophylaxis: Lovenox  Code Status: Full code  Family Communication: Wife at bedside (all questions answered to satisfaction)  Consults: Neurology  Severity of Illness: The appropriate patient status for this patient is OBSERVATION. Observation status is judged to be reasonable and necessary in order to provide the required intensity of service to ensure the patient's safety. The patient's presenting symptoms, physical exam findings, and initial radiographic and laboratory data in the context of their medical condition is felt to place them at decreased risk for further clinical deterioration. Furthermore, it is anticipated that the patient will be medically stable for discharge from the hospital within 2 midnights of admission.   Author: Frankey Shown, DO 08/01/2023 3:02 AM  For on call review [email or web address].

## 2023-07-31 NOTE — ED Notes (Signed)
ED TO INPATIENT HANDOFF REPORT  ED Nurse Name and Phone #: Leanord Hawking RN [phone]  S Name/Age/Gender Mathew Brady 58 y.o. male Room/Bed: APA01/APA01  Code Status   Code Status: Prior  Home/SNF/Other Home Patient oriented to: self, place, time, and situation Is this baseline? Yes   Triage Complete: Triage complete  Chief Complaint Acute ischemic stroke [facility]) [I63.9]  Triage Note Pt arrived via POV c/o body tremors. Pt reports he has a CT Scan scheduled for later today. Pt reports he felt a "tingling sensation and ' burning sensation" mainly in his legs and his arms. Pt denies injuries.    Allergies Allergies  Allergen Reactions   Succinylcholine Anaphylaxis   Pregabalin Other (See Comments) and Photosensitivity    Level of Care/Admitting Diagnosis ED Disposition     ED Disposition  Admit   Condition  --   Comment  Hospital Area: [facility] [[id number]]  Level of Care: Telemetry [5]  Covid Evaluation: Asymptomatic - no recent exposure (last 10 days) testing not required  Diagnosis: Acute ischemic stroke [facility]) [[id number]]  Admitting Physician: Frankey Shown [[id number]]  Attending Physician: Frankey Shown [[id number]]          B Medical/Surgery History Past Medical History:  Diagnosis Date   Anemia 06/18/2012   Anxiety    Arthritis    CAD (coronary artery disease)    Mild to moderate nonobstructive CAD at cardiac catheterization 2018   COPD (chronic obstructive pulmonary disease) (HCC)    Essential hypertension    Fibromyalgia    GERD (gastroesophageal reflux disease)    History of kidney stones    Opiate use    Palpitations    Peripheral neuropathy    Pulmonary nodule    Vitamin B 12 deficiency    Past Surgical History:  Procedure Laterality Date   COLONOSCOPY     found to have 1 polyp   ESOPHAGEAL DILATION  1993   LEFT HEART CATH AND CORONARY ANGIOGRAPHY N/A 12/09/2016   Procedure: Left Heart Cath and Coronary Angiography;  Surgeon: Yvonne Kendall, MD;  Location: [facility] INVASIVE CV LAB;  Service: Cardiovascular;  Laterality: N/A;   TONSILLECTOMY     VIDEO ASSISTED THORACOSCOPY (VATS)/WEDGE RESECTION Left 09/08/2017   Procedure: VIDEO ASSISTED THORACOSCOPY (VATS)/WEDGE RESECTION;  Surgeon: Loreli Slot, MD;  Location: [facility] OR;  Service: Thoracic;  Laterality: Left;     A IV Location/Drains/Wounds Patient Lines/Drains/Airways Status     Active Line/Drains/Airways     None            Intake/Output Last 24 hours No intake or output data in the 24 hours ending 07/31/23 2338  Labs/Imaging Results for orders placed or performed during the hospital encounter of 07/31/23 (from the past 48 hours)  CBC     Status: None   Collection Time: 07/31/23  8:54 PM  Result Value Ref Range   WBC 8.6 4.0 - 10.5 K/uL   RBC 5.17 4.22 - 5.81 MIL/uL   Hemoglobin 16.7 13.0 - 17.0 g/dL   HCT 08.6 57.8 - 46.9 %   MCV 96.9 80.0 - 100.0 fL   MCH 32.3 26.0 - 34.0 pg   MCHC 33.3 30.0 - 36.0 g/dL   RDW 62.9 52.8 - 41.3 %   Platelets 360 150 - 400 K/uL   nRBC 0.0 0.0 - 0.2 %    Comment: Performed at [facility], [address] [place], [place] [address]  Basic metabolic panel     Status: Abnormal   Collection  Time: 07/31/23  8:54 PM  Result Value Ref Range   Sodium 136 135 - 145 mmol/L   Potassium 4.0 3.5 - 5.1 mmol/L   Chloride 95 (L) 98 - 111 mmol/L   CO2 27 22 - 32 mmol/L   Glucose, Bld 71 70 - 99 mg/dL    Comment: Glucose reference range applies only to samples taken after fasting for at least 8 hours.   BUN 9 6 - 20 mg/dL   Creatinine, Ser 1.47 0.61 - 1.24 mg/dL   Calcium 9.7 8.9 - 82.9 mg/dL   GFR, Estimated >56 >21 mL/min    Comment: (NOTE) Calculated using the CKD-EPI Creatinine Equation (2021)    Anion gap 14 5 - 15    Comment: Performed at [facility], [address] [place], [place] [address]   MR ANGIO HEAD WO CONTRAST Result Date: 07/31/2023 CLINICAL DATA:  Initial evaluation for neuro deficit, stroke  suspected. EXAM: MRI HEAD WITHOUT AND WITH CONTRAST MRA HEAD WITHOUT CONTRAST TECHNIQUE: Multiplanar, multi-echo pulse sequences of the brain and surrounding structures were acquired without and with intravenous contrast. Angiographic images of the Circle of Willis were acquired using MRA technique without intravenous contrast. CONTRAST:  9mL GADAVIST GADOBUTROL 1 MMOL/ML IV SOLN COMPARISON:  CT from earlier the same day. FINDINGS: MRI HEAD FINDINGS Brain: Cerebral volume within normal limits. Mild chronic microvascular ischemic disease for age. Remote lacunar infarct noted at the left basal ganglia. Area of evolving encephalomalacia and gliosis noted involving the right occipital lobe. Associated diffusion signal abnormality at this level without ADC correlate. Heterogeneous postcontrast enhancement. Finding consistent with an evolving subacute ischemic infarct. Associated petechial blood products at this location without frank hemorrhagic transformation or significant regional mass effect. An additional subcentimeter focus of diffusion signal abnormality noted at the right frontal centrum semi ovale superiorly (series 5, image 27), consistent with an early subacute ischemic infarct. Minimal associated petechial blood product noted at this location as well without mass effect. Gray-white matter differentiation otherwise maintained. No other acute or chronic intracranial blood products. No mass lesion or midline shift. No hydrocephalus or extra-axial fluid collection. Pituitary gland suprasellar region within normal limits. No abnormal enhancement. Vascular: Major intracranial vascular flow voids are maintained. Skull and upper cervical spine: Craniocervical junctional limits. Bone marrow signal intensity normal. Localized thickening of the left parietal calvarium without discrete lesion noted. No scalp soft tissue abnormality. Sinuses/Orbits: Globes orbital soft tissues within normal limits. Paranasal sinuses are  largely clear. No significant mastoid effusion. Other: None. MRA HEAD FINDINGS Anterior circulation: Posterior circulation: Both V4 segments patent without stenosis. Both PICA patent. Basilar patent without stenosis. Superior cerebral arteries patent bilaterally. Both PCAs primarily supplied via the basilar. PCAs are patent to their distal aspects without significant stenosis. Anatomic variants: None significant.  No aneurysm. IMPRESSION: MRI HEAD: 1. Evolving subacute ischemic infarct involving the right occipital lobe. Associated petechial blood products without frank hemorrhagic transformation or significant regional mass effect. 2. Additional subcentimeter early subacute ischemic infarct involving the right frontal centrum semi ovale. 3. Underlying mild chronic microvascular ischemic disease with remote left basal ganglia lacunar infarct. MRA HEAD: 1. Negative intracranial MRA for large vessel occlusion. 2. Focal severe proximal right M2 stenosis, inferior division. 3. No other hemodynamically significant or correctable stenosis about the intracranial arterial circulation. Electronically Signed   By: Rise Mu M.D.   On: 07/31/2023 22:57   MR BRAIN W WO CONTRAST Result Date: 07/31/2023 CLINICAL DATA:  Initial evaluation for neuro deficit, stroke suspected. EXAM:  MRI HEAD WITHOUT AND WITH CONTRAST MRA HEAD WITHOUT CONTRAST TECHNIQUE: Multiplanar, multi-echo pulse sequences of the brain and surrounding structures were acquired without and with intravenous contrast. Angiographic images of the Circle of Willis were acquired using MRA technique without intravenous contrast. CONTRAST:  9mL GADAVIST GADOBUTROL 1 MMOL/ML IV SOLN COMPARISON:  CT from earlier the same day. FINDINGS: MRI HEAD FINDINGS Brain: Cerebral volume within normal limits. Mild chronic microvascular ischemic disease for age. Remote lacunar infarct noted at the left basal ganglia. Area of evolving encephalomalacia and gliosis noted  involving the right occipital lobe. Associated diffusion signal abnormality at this level without ADC correlate. Heterogeneous postcontrast enhancement. Finding consistent with an evolving subacute ischemic infarct. Associated petechial blood products at this location without frank hemorrhagic transformation or significant regional mass effect. An additional subcentimeter focus of diffusion signal abnormality noted at the right frontal centrum semi ovale superiorly (series 5, image 27), consistent with an early subacute ischemic infarct. Minimal associated petechial blood product noted at this location as well without mass effect. Gray-white matter differentiation otherwise maintained. No other acute or chronic intracranial blood products. No mass lesion or midline shift. No hydrocephalus or extra-axial fluid collection. Pituitary gland suprasellar region within normal limits. No abnormal enhancement. Vascular: Major intracranial vascular flow voids are maintained. Skull and upper cervical spine: Craniocervical junctional limits. Bone marrow signal intensity normal. Localized thickening of the left parietal calvarium without discrete lesion noted. No scalp soft tissue abnormality. Sinuses/Orbits: Globes orbital soft tissues within normal limits. Paranasal sinuses are largely clear. No significant mastoid effusion. Other: None. MRA HEAD FINDINGS Anterior circulation: Posterior circulation: Both V4 segments patent without stenosis. Both PICA patent. Basilar patent without stenosis. Superior cerebral arteries patent bilaterally. Both PCAs primarily supplied via the basilar. PCAs are patent to their distal aspects without significant stenosis. Anatomic variants: None significant.  No aneurysm. IMPRESSION: MRI HEAD: 1. Evolving subacute ischemic infarct involving the right occipital lobe. Associated petechial blood products without frank hemorrhagic transformation or significant regional mass effect. 2. Additional  subcentimeter early subacute ischemic infarct involving the right frontal centrum semi ovale. 3. Underlying mild chronic microvascular ischemic disease with remote left basal ganglia lacunar infarct. MRA HEAD: 1. Negative intracranial MRA for large vessel occlusion. 2. Focal severe proximal right M2 stenosis, inferior division. 3. No other hemodynamically significant or correctable stenosis about the intracranial arterial circulation. Electronically Signed   By: Rise Mu M.D.   On: 07/31/2023 22:57   CT Head Wo Contrast Result Date: 07/31/2023 CLINICAL DATA:  Memory loss, body tremors, tingling and burning sensation in legs and arms. EXAM: CT HEAD WITHOUT CONTRAST TECHNIQUE: Contiguous axial images were obtained from the base of the skull through the vertex without intravenous contrast. RADIATION DOSE REDUCTION: This exam was performed according to the departmental dose-optimization program which includes automated exposure control, adjustment of the mA and/or kV according to patient size and/or use of iterative reconstruction technique. COMPARISON:  CT 02/22/2017 FINDINGS: Brain: Area of hypoattenuation within the right occipital lobe (series 2/image 17) concerning for acute or subacute infarct. No intracranial hemorrhage. No hydrocephalus. No mass effect. Basal cisterns are patent. Vascular: No hyperdense vessel or unexpected calcification. Skull: Normal. Negative for fracture or focal lesion. Sinuses/Orbits: No acute finding. Other: None. IMPRESSION: Area of hypoattenuation within the right occipital lobe concerning for acute or subacute infarct. MRI is recommended for further evaluation. Critical Value/emergent results were called by telephone at the time of interpretation on 07/31/2023 at 8:53 pm to provider ANDREW TEE , who  verbally acknowledged these results. Electronically Signed   By: Minerva Fester M.D.   On: 07/31/2023 21:05    Pending Labs Unresulted Labs (From admission, onward)     None       Vitals/Pain Today's Vitals   07/31/23 2100 07/31/23 2300 07/31/23 2330 07/31/23 2332  BP: (!) 152/101 (!) 137/106 (!) 160/90   Pulse: 81  80   Resp: 14  16   Temp:    98.5 F (36.9 C)  TempSrc:    Oral  SpO2: 100%  100%   Weight:      Height:      PainSc:        Isolation Precautions No active isolations  Medications Medications  meclizine (ANTIVERT) tablet 25 mg (25 mg Oral Patient Refused/Not Given 07/31/23 2119)  amoxicillin-clavulanate (AUGMENTIN) 875-125 MG per tablet 1 tablet (1 tablet Oral Given 07/31/23 2118)  gadobutrol (GADAVIST) 1 MMOL/ML injection 9 mL (9 mLs Intravenous Contrast Given 07/31/23 2204)    Mobility walks     Focused Assessments Neuro Assessment Handoff:  Swallow screen pass? Yes          Neuro Assessment: Exceptions to WDL Neuro Checks:      Has TPA been given? No If patient is a Neuro Trauma and patient is going to OR before floor call report to 4N Charge nurse: [phone] or [phone]   R Recommendations: See Admitting Provider Note  Report given to:   Additional Notes: N/A

## 2023-07-31 NOTE — ED Provider Notes (Signed)
EMERGENCY DEPARTMENT AT [facility] Provider Note   CSN: [id number] Arrival date & time: 07/31/23  1401     History  Chief Complaint  Patient presents with   Tremors    Mathew Brady is a 58 y.o. male.  58 year old male with past medical history of chronic back pain, hypertension, and fibromyalgia presenting to the emergency department today with dizziness.  The patient states that has been on now for the past 2 weeks or so.  He also reports having some mild pain in his right ear as well as some fullness.  He states that actually while he was waiting in the waiting room today that he felt a pop in his ear and this seems to have improved some.  He still having some mild dizziness but thinks that he is feeling much better.  He denies any fevers.  Denies any neck pain or stiffness.  He denies any focal weakness, numbness, or tingling.  He came to the ER today for further evaluation regarding this due to ongoing symptoms.  He also reports some tingling in all of his extremities that has also been going now for the past 2 weeks.  He had a CT scan of his head ordered by his primary care doctor but not had this performed yet.        Home Medications Prior to Admission medications   Medication Sig Start Date End Date Taking? Authorizing Provider  acetaminophen (TYLENOL) 500 MG tablet Take 2 tablets (1,000 mg total) by mouth every 6 (six) hours. 03/26/18  Yes Loren Racer, MD  albuterol (PROVENTIL HFA;VENTOLIN HFA) 108 (90 Base) MCG/ACT inhaler Inhale 2 puffs into the lungs every 4 (four) hours as needed for wheezing or shortness of breath. 03/25/17  Yes Roslynn Amble, MD  ALPRAZolam Prudy Feeler) 0.5 MG tablet Take 0.5 mg by mouth at bedtime as needed for anxiety or sleep.  08/13/17  Yes [provider]  gabapentin (NEURONTIN) 800 MG tablet TAKE 1 TABLET BY MOUTH THREE TIMES A DAY Patient taking differently: Take 800 mg by mouth in the morning, at noon, in the  evening, and at bedtime. 02/05/17  Yes Dettinger, Elige Radon, MD  hydrochlorothiazide (HYDRODIURIL) 25 MG tablet Take 25 mg by mouth daily. 09/14/22  Yes [provider]  oxyCODONE-acetaminophen (PERCOCET) 10-325 MG tablet Take 1 tablet by mouth every 6 (six) hours.  03/05/18  Yes [provider]  amLODipine (NORVASC) 10 MG tablet Take 1 tablet by mouth daily.    [provider]      Allergies    Succinylcholine and Pregabalin    Review of Systems   Review of Systems  Neurological:  Positive for dizziness.  All other systems reviewed and are negative.   Physical Exam Updated Vital Signs BP (!) 152/101   Pulse 81   Temp 98.6 F (37 C) (Oral)   Resp 14   Ht 5\' 9"  (1.753 m)   Wt 90.7 kg   SpO2 100%   BMI 29.53 kg/m  Physical Exam Vitals and nursing note reviewed.   Gen: NAD Eyes: PERRL, EOMI HEENT: no oropharyngeal swelling, right TM is bulging with surrounding erythema noted Neck: trachea midline Resp: clear to auscultation bilaterally Card: RRR, no murmurs, rubs, or gallops Abd: nontender, nondistended Extremities: no calf tenderness, no edema Vascular: 2+ radial pulses bilaterally, 2+ DP pulses bilaterally Neuro: Cranial nerves intact, equal strength and sensation throughout bilateral upper and lower extremities with no dysmetria on finger-nose testing Skin:  no rashes Psyc: acting appropriately   ED Results / Procedures / Treatments   Labs (all labs ordered are listed, but only abnormal results are displayed) Labs Reviewed  BASIC METABOLIC PANEL - Abnormal; Notable for the following components:      Result Value   Chloride 95 (*)    All other components within normal limits  CBC    EKG None  Radiology MR ANGIO HEAD WO CONTRAST Result Date: 07/31/2023 CLINICAL DATA:  Initial evaluation for neuro deficit, stroke suspected. EXAM: MRI HEAD WITHOUT AND WITH CONTRAST MRA HEAD WITHOUT CONTRAST TECHNIQUE: Multiplanar, multi-echo pulse sequences  of the brain and surrounding structures were acquired without and with intravenous contrast. Angiographic images of the Circle of Willis were acquired using MRA technique without intravenous contrast. CONTRAST:  9mL GADAVIST GADOBUTROL 1 MMOL/ML IV SOLN COMPARISON:  CT from earlier the same day. FINDINGS: MRI HEAD FINDINGS Brain: Cerebral volume within normal limits. Mild chronic microvascular ischemic disease for age. Remote lacunar infarct noted at the left basal ganglia. Area of evolving encephalomalacia and gliosis noted involving the right occipital lobe. Associated diffusion signal abnormality at this level without ADC correlate. Heterogeneous postcontrast enhancement. Finding consistent with an evolving subacute ischemic infarct. Associated petechial blood products at this location without frank hemorrhagic transformation or significant regional mass effect. An additional subcentimeter focus of diffusion signal abnormality noted at the right frontal centrum semi ovale superiorly (series 5, image 27), consistent with an early subacute ischemic infarct. Minimal associated petechial blood product noted at this location as well without mass effect. Gray-white matter differentiation otherwise maintained. No other acute or chronic intracranial blood products. No mass lesion or midline shift. No hydrocephalus or extra-axial fluid collection. Pituitary gland suprasellar region within normal limits. No abnormal enhancement. Vascular: Major intracranial vascular flow voids are maintained. Skull and upper cervical spine: Craniocervical junctional limits. Bone marrow signal intensity normal. Localized thickening of the left parietal calvarium without discrete lesion noted. No scalp soft tissue abnormality. Sinuses/Orbits: Globes orbital soft tissues within normal limits. Paranasal sinuses are largely clear. No significant mastoid effusion. Other: None. MRA HEAD FINDINGS Anterior circulation: Posterior circulation: Both  V4 segments patent without stenosis. Both PICA patent. Basilar patent without stenosis. Superior cerebral arteries patent bilaterally. Both PCAs primarily supplied via the basilar. PCAs are patent to their distal aspects without significant stenosis. Anatomic variants: None significant.  No aneurysm. IMPRESSION: MRI HEAD: 1. Evolving subacute ischemic infarct involving the right occipital lobe. Associated petechial blood products without frank hemorrhagic transformation or significant regional mass effect. 2. Additional subcentimeter early subacute ischemic infarct involving the right frontal centrum semi ovale. 3. Underlying mild chronic microvascular ischemic disease with remote left basal ganglia lacunar infarct. MRA HEAD: 1. Negative intracranial MRA for large vessel occlusion. 2. Focal severe proximal right M2 stenosis, inferior division. 3. No other hemodynamically significant or correctable stenosis about the intracranial arterial circulation. Electronically Signed   By: Rise Mu M.D.   On: 07/31/2023 22:57   MR BRAIN W WO CONTRAST Result Date: 07/31/2023 CLINICAL DATA:  Initial evaluation for neuro deficit, stroke suspected. EXAM: MRI HEAD WITHOUT AND WITH CONTRAST MRA HEAD WITHOUT CONTRAST TECHNIQUE: Multiplanar, multi-echo pulse sequences of the brain and surrounding structures were acquired without and with intravenous contrast. Angiographic images of the Circle of Willis were acquired using MRA technique without intravenous contrast. CONTRAST:  9mL GADAVIST GADOBUTROL 1 MMOL/ML IV SOLN COMPARISON:  CT from earlier the same day. FINDINGS: MRI HEAD FINDINGS Brain: Cerebral volume within normal limits. Mild  chronic microvascular ischemic disease for age. Remote lacunar infarct noted at the left basal ganglia. Area of evolving encephalomalacia and gliosis noted involving the right occipital lobe. Associated diffusion signal abnormality at this level without ADC correlate. Heterogeneous  postcontrast enhancement. Finding consistent with an evolving subacute ischemic infarct. Associated petechial blood products at this location without frank hemorrhagic transformation or significant regional mass effect. An additional subcentimeter focus of diffusion signal abnormality noted at the right frontal centrum semi ovale superiorly (series 5, image 27), consistent with an early subacute ischemic infarct. Minimal associated petechial blood product noted at this location as well without mass effect. Gray-white matter differentiation otherwise maintained. No other acute or chronic intracranial blood products. No mass lesion or midline shift. No hydrocephalus or extra-axial fluid collection. Pituitary gland suprasellar region within normal limits. No abnormal enhancement. Vascular: Major intracranial vascular flow voids are maintained. Skull and upper cervical spine: Craniocervical junctional limits. Bone marrow signal intensity normal. Localized thickening of the left parietal calvarium without discrete lesion noted. No scalp soft tissue abnormality. Sinuses/Orbits: Globes orbital soft tissues within normal limits. Paranasal sinuses are largely clear. No significant mastoid effusion. Other: None. MRA HEAD FINDINGS Anterior circulation: Posterior circulation: Both V4 segments patent without stenosis. Both PICA patent. Basilar patent without stenosis. Superior cerebral arteries patent bilaterally. Both PCAs primarily supplied via the basilar. PCAs are patent to their distal aspects without significant stenosis. Anatomic variants: None significant.  No aneurysm. IMPRESSION: MRI HEAD: 1. Evolving subacute ischemic infarct involving the right occipital lobe. Associated petechial blood products without frank hemorrhagic transformation or significant regional mass effect. 2. Additional subcentimeter early subacute ischemic infarct involving the right frontal centrum semi ovale. 3. Underlying mild chronic  microvascular ischemic disease with remote left basal ganglia lacunar infarct. MRA HEAD: 1. Negative intracranial MRA for large vessel occlusion. 2. Focal severe proximal right M2 stenosis, inferior division. 3. No other hemodynamically significant or correctable stenosis about the intracranial arterial circulation. Electronically Signed   By: Rise Mu M.D.   On: 07/31/2023 22:57   CT Head Wo Contrast Result Date: 07/31/2023 CLINICAL DATA:  Memory loss, body tremors, tingling and burning sensation in legs and arms. EXAM: CT HEAD WITHOUT CONTRAST TECHNIQUE: Contiguous axial images were obtained from the base of the skull through the vertex without intravenous contrast. RADIATION DOSE REDUCTION: This exam was performed according to the departmental dose-optimization program which includes automated exposure control, adjustment of the mA and/or kV according to patient size and/or use of iterative reconstruction technique. COMPARISON:  CT 02/22/2017 FINDINGS: Brain: Area of hypoattenuation within the right occipital lobe (series 2/image 17) concerning for acute or subacute infarct. No intracranial hemorrhage. No hydrocephalus. No mass effect. Basal cisterns are patent. Vascular: No hyperdense vessel or unexpected calcification. Skull: Normal. Negative for fracture or focal lesion. Sinuses/Orbits: No acute finding. Other: None. IMPRESSION: Area of hypoattenuation within the right occipital lobe concerning for acute or subacute infarct. MRI is recommended for further evaluation. Critical Value/emergent results were called by telephone at the time of interpretation on 07/31/2023 at 8:53 pm to provider Arizona Nordquist , who verbally acknowledged these results. Electronically Signed   By: Minerva Fester M.D.   On: 07/31/2023 21:05    Procedures Procedures    Medications Ordered in ED Medications  meclizine (ANTIVERT) tablet 25 mg (25 mg Oral Patient Refused/Not Given 07/31/23 2119)  amoxicillin-clavulanate  (AUGMENTIN) 875-125 MG per tablet 1 tablet (1 tablet Oral Given 07/31/23 2118)  gadobutrol (GADAVIST) 1 MMOL/ML injection 9 mL (9 mLs  Intravenous Contrast Given 07/31/23 2204)    ED Course/ Medical Decision Making/ A&P                                 Medical Decision Making 58 year old male with past medical history of hypertension, chronic back pain, and fibromyalgia presenting to the emergency department today with dizziness and tingling in his extremities over the past 2 weeks.  I will further evaluate patient here with basic labs to evaluate for anemia or electrolyte abnormalities.  Will obtain a CT scan to evaluate for intracranial hemorrhage or mass lesion.  I give the patient meclizine here as well as Augmentin as does appear that he does have some inflammation of his right TM.  Will reevaluate for ultimate disposition.  The patient CT scan shows findings concerning for possible infarct.  MRI confirms this.  Symptoms have been going on now for days with the dizziness that he is outside the window for any acute treatment modalities.  A call was placed to hospital service for admission.    Amount and/or Complexity of Data Reviewed Labs: ordered. Radiology: ordered.  Risk Prescription drug management.           Final Clinical Impression(s) / ED Diagnoses Final diagnoses:  Acute CVA (cerebrovascular accident) [facility])    Rx / DC Orders ED Discharge Orders     None         Durwin Glaze, MD 07/31/23 2302

## 2023-08-01 ENCOUNTER — Encounter (HOSPITAL_COMMUNITY): Payer: Self-pay | Admitting: Internal Medicine

## 2023-08-01 ENCOUNTER — Observation Stay (HOSPITAL_COMMUNITY)
Admit: 2023-08-01 | Discharge: 2023-08-01 | Disposition: A | Discharge status: Home / Self Care | Payer: Medicaid Other | Attending: Student

## 2023-08-01 ENCOUNTER — Other Ambulatory Visit (HOSPITAL_COMMUNITY): Payer: Self-pay | Admitting: *Deleted

## 2023-08-01 ENCOUNTER — Observation Stay (HOSPITAL_BASED_OUTPATIENT_CLINIC_OR_DEPARTMENT_OTHER): Payer: Medicaid Other

## 2023-08-01 ENCOUNTER — Observation Stay (HOSPITAL_COMMUNITY): Payer: Medicaid Other

## 2023-08-01 DIAGNOSIS — R410 Disorientation, unspecified: Secondary | ICD-10-CM | POA: Diagnosis not present

## 2023-08-01 DIAGNOSIS — F419 Anxiety disorder, unspecified: Secondary | ICD-10-CM

## 2023-08-01 DIAGNOSIS — R569 Unspecified convulsions: Secondary | ICD-10-CM | POA: Diagnosis not present

## 2023-08-01 DIAGNOSIS — I639 Cerebral infarction, unspecified: Secondary | ICD-10-CM

## 2023-08-01 DIAGNOSIS — J440 Chronic obstructive pulmonary disease with acute lower respiratory infection: Secondary | ICD-10-CM

## 2023-08-01 DIAGNOSIS — R42 Dizziness and giddiness: Secondary | ICD-10-CM | POA: Diagnosis not present

## 2023-08-01 DIAGNOSIS — I10 Essential (primary) hypertension: Secondary | ICD-10-CM | POA: Diagnosis not present

## 2023-08-01 DIAGNOSIS — I6389 Other cerebral infarction: Secondary | ICD-10-CM | POA: Diagnosis not present

## 2023-08-01 LAB — COMPREHENSIVE METABOLIC PANEL
ALT: 16 U/L (ref 0–44)
AST: 14 U/L — ABNORMAL LOW (ref 15–41)
Albumin: 3.8 g/dL (ref 3.5–5.0)
Alkaline Phosphatase: 72 U/L (ref 38–126)
Anion gap: 9 (ref 5–15)
BUN: 13 mg/dL (ref 6–20)
CO2: 27 mmol/L (ref 22–32)
Calcium: 8.9 mg/dL (ref 8.9–10.3)
Chloride: 100 mmol/L (ref 98–111)
Creatinine, Ser: 0.88 mg/dL (ref 0.61–1.24)
GFR, Estimated: >60 mL/min (ref >60)
Glucose, Bld: 91 mg/dL (ref 70–99)
Potassium: 3.2 mmol/L — ABNORMAL LOW (ref 3.5–5.1)
Sodium: 136 mmol/L (ref 135–145)
Total Bilirubin: 0.3 mg/dL (ref 0.0–1.2)
Total Protein: 6.4 g/dL — ABNORMAL LOW (ref 6.5–8.1)

## 2023-08-01 LAB — LIPID PANEL
Cholesterol: 136 mg/dL (ref 0–200)
HDL: 28 mg/dL — ABNORMAL LOW (ref >40)
LDL Cholesterol: 62 mg/dL (ref 0–99)
Total CHOL/HDL Ratio: 4.9 {ratio}
Triglycerides: 231 mg/dL — ABNORMAL HIGH (ref <150)
VLDL: 46 mg/dL — ABNORMAL HIGH (ref 0–40)

## 2023-08-01 LAB — CBC
HCT: 44.8 % (ref 39.0–52.0)
Hemoglobin: 15 g/dL (ref 13.0–17.0)
MCH: 31.9 pg (ref 26.0–34.0)
MCHC: 33.5 g/dL (ref 30.0–36.0)
MCV: 95.3 fL (ref 80.0–100.0)
Platelets: 324 10*3/uL (ref 150–400)
RBC: 4.7 MIL/uL (ref 4.22–5.81)
RDW: 12 % (ref 11.5–15.5)
WBC: 8.5 10*3/uL (ref 4.0–10.5)
nRBC: 0 % (ref 0.0–0.2)

## 2023-08-01 LAB — MENINGITIS/ENCEPHALITIS PANEL (CSF)

## 2023-08-01 LAB — ECHOCARDIOGRAM COMPLETE
AR max vel: 2.01 cm2
AV Area VTI: 2.07 cm2
AV Area mean vel: 2.2 cm2
AV Mean grad: 6 mm[Hg]
AV Peak grad: 11.4 mm[Hg]
Ao pk vel: 1.69 m/s
Area-P 1/2: 2.26 cm2
Height: 69 in
S' Lateral: 3.1 cm
Weight: 3022.95 [oz_av]

## 2023-08-01 LAB — RAPID URINE DRUG SCREEN, HOSP PERFORMED
Amphetamines: POSITIVE — AB
Barbiturates: NOT DETECTED
Benzodiazepines: NOT DETECTED
Cocaine: NOT DETECTED
Opiates: POSITIVE — AB
Tetrahydrocannabinol: NOT DETECTED

## 2023-08-01 LAB — CSF CELL COUNT WITH DIFFERENTIAL
RBC Count, CSF: 0 /mm3
RBC Count, CSF: 2 /mm3 — ABNORMAL HIGH
Tube #: 1
Tube #: 4
WBC, CSF: 1 /mm3 (ref 0–5)
WBC, CSF: 1 /mm3 (ref 0–5)

## 2023-08-01 LAB — PROTEIN AND GLUCOSE, CSF
Glucose, CSF: 65 mg/dL (ref 40–70)
Total  Protein, CSF: 41 mg/dL (ref 15–45)

## 2023-08-01 LAB — PHOSPHORUS: Phosphorus: 2.8 mg/dL (ref 2.5–4.6)

## 2023-08-01 LAB — HEMOGLOBIN A1C
Hgb A1c MFr Bld: 5.5 % (ref 4.8–5.6)
Mean Plasma Glucose: 111.15 mg/dL

## 2023-08-01 LAB — MAGNESIUM: Magnesium: 2.5 mg/dL — ABNORMAL HIGH (ref 1.7–2.4)

## 2023-08-01 LAB — HIV ANTIBODY (ROUTINE TESTING W REFLEX): HIV Screen 4th Generation wRfx: NONREACTIVE

## 2023-08-01 MED ORDER — NICOTINE 21 MG/24HR TD PT24: 21.0000 mg | MEDICATED_PATCH | Freq: Every day | TRANSDERMAL | Status: DC

## 2023-08-01 MED ORDER — GABAPENTIN 800 MG PO TABS: 800.0000 mg | ORAL_TABLET | Freq: Three times a day (TID) | ORAL | 0 refills | Status: AC

## 2023-08-01 MED ORDER — CLOPIDOGREL BISULFATE 75 MG PO TABS: 75.0000 mg | ORAL_TABLET | Freq: Every day | ORAL | 0 refills | Status: AC

## 2023-08-01 MED ORDER — PERFLUTREN LIPID MICROSPHERE: 1.0000 mL | INTRAVENOUS | Status: AC | PRN

## 2023-08-01 MED ORDER — ALPRAZOLAM 0.5 MG PO TABS: 0.5000 mg | ORAL_TABLET | Freq: Every evening | ORAL | Status: DC | PRN

## 2023-08-01 MED ORDER — ASPIRIN 81 MG PO TBEC: 81.0000 mg | DELAYED_RELEASE_TABLET | Freq: Every day | ORAL | 12 refills | Status: DC

## 2023-08-01 MED ORDER — POTASSIUM CHLORIDE CRYS ER 20 MEQ PO TBCR
40.0000 meq | EXTENDED_RELEASE_TABLET | ORAL | Status: AC
  Administered 2023-08-01 (×2): 40 meq via ORAL
  Filled 2023-08-01 (×2): qty 2

## 2023-08-01 MED ORDER — NICOTINE 21 MG/24HR TD PT24
21.0000 mg | MEDICATED_PATCH | Freq: Every day | TRANSDERMAL | Status: DC
  Administered 2023-08-01: 21 mg via TRANSDERMAL
  Filled 2023-08-01: qty 1

## 2023-08-01 MED ORDER — ASPIRIN 81 MG PO TBEC
81.0000 mg | DELAYED_RELEASE_TABLET | Freq: Every day | ORAL | Status: DC
  Administered 2023-08-01: 81 mg via ORAL
  Filled 2023-08-01: qty 1

## 2023-08-01 MED ORDER — ACETAMINOPHEN 325 MG PO TABS
650.0000 mg | ORAL_TABLET | Freq: Four times a day (QID) | ORAL | Status: DC | PRN
Start: 2023-08-01 — End: 2023-08-01

## 2023-08-01 MED ORDER — CLOPIDOGREL BISULFATE 75 MG PO TABS
75.0000 mg | ORAL_TABLET | Freq: Every day | ORAL | Status: DC
  Administered 2023-08-01: 75 mg via ORAL
  Filled 2023-08-01: qty 1

## 2023-08-01 MED ORDER — OXYCODONE-ACETAMINOPHEN 10-325 MG PO TABS: 1.0000 | ORAL_TABLET | Freq: Four times a day (QID) | ORAL | Status: DC

## 2023-08-01 MED ORDER — ACETAMINOPHEN 650 MG RE SUPP: 650.0000 mg | Freq: Four times a day (QID) | RECTAL | Status: DC | PRN

## 2023-08-01 MED ORDER — OXYCODONE HCL 5 MG PO TABS: 5.0000 mg | ORAL_TABLET | Freq: Four times a day (QID) | ORAL | Status: DC | PRN

## 2023-08-01 MED ORDER — AMLODIPINE BESYLATE 5 MG PO TABS
10.0000 mg | ORAL_TABLET | Freq: Every day | ORAL | Status: DC
  Administered 2023-08-01: 10 mg via ORAL
  Filled 2023-08-01: qty 2

## 2023-08-01 MED ORDER — ENOXAPARIN SODIUM 40 MG/0.4ML IJ SOSY
40.0000 mg | PREFILLED_SYRINGE | INTRAMUSCULAR | Status: DC
  Administered 2023-08-01: 40 mg via SUBCUTANEOUS
  Filled 2023-08-01: qty 0.4

## 2023-08-01 MED ORDER — GABAPENTIN 400 MG PO CAPS
800.0000 mg | ORAL_CAPSULE | Freq: Three times a day (TID) | ORAL | Status: DC
  Administered 2023-08-01: 800 mg via ORAL
  Filled 2023-08-01: qty 2

## 2023-08-01 MED ORDER — ALBUTEROL SULFATE HFA 108 (90 BASE) MCG/ACT IN AERS: 2.0000 | INHALATION_SPRAY | RESPIRATORY_TRACT | Status: DC | PRN

## 2023-08-01 MED ORDER — ONDANSETRON HCL 4 MG/2ML IJ SOLN: 4.0000 mg | Freq: Four times a day (QID) | INTRAMUSCULAR | Status: DC | PRN

## 2023-08-01 MED ORDER — ONDANSETRON HCL 4 MG PO TABS: 4.0000 mg | ORAL_TABLET | Freq: Four times a day (QID) | ORAL | Status: DC | PRN

## 2023-08-01 MED ORDER — OXYCODONE-ACETAMINOPHEN 5-325 MG PO TABS
1.0000 | ORAL_TABLET | Freq: Four times a day (QID) | ORAL | Status: DC | PRN
  Administered 2023-08-01: 1 via ORAL
  Filled 2023-08-01: qty 1

## 2023-08-01 NOTE — Progress Notes (Signed)
EEG complete - results pending

## 2023-08-01 NOTE — Progress Notes (Signed)
*  PRELIMINARY RESULTS* Echocardiogram 2D Echocardiogram has been performed.  Stacey Drain 08/01/2023, 12:36 PM

## 2023-08-01 NOTE — Consult Note (Signed)
I connected with  Mathew Brady on 08/01/23 by a video enabled telemedicine application and verified that I am speaking with the correct person using two identifiers.   I discussed the limitations of evaluation and management by telemedicine. The patient expressed understanding and agreed to proceed.  Location of patient: [facility] Location of physician: [facility]  Neurology Consultation Reason for Consult: stroke Referring Physician: Dr. Frankey Shown  CC: Dizziness, lightheadedness, tingling  History is obtained from: Patient, chart review  HPI: Mathew Brady is a 58 y.o. male with past medical history of hypertension, coronary artery disease, peripheral neuropathy, fibromyalgia who presented with dizziness and confusion.  Patient states for the last few days he has been feeling dizzy described as feeling off balance and lightheaded.  Also states he has been having more tingling than his baseline neuropathy in arms and legs.  Unable to provide much more history.  Denies headache, vision changes, focal weakness, recent illness, medication changes  Wife at bedside states she first noticed that patient was confused and looking "off" on 07/20/2023.  Then on 08/07/2023 at around 7:30 PM she noticed that patient had full body jerking lasting for couple of minutes not associated with incontinence or tongue bite.  After the episode ended he was very agitated, got up and left to go to his mother's house.  However on the way to his mother's house at some point he got really confused and was eventually found by police and taken to the jail.  His family got him out of the jail in the next couple of days.  On 07/28/2023 he had another seizure-like episode.  He went to see the primary care doctor on 07/30/2023 and were told to come to emergency room.    Of note, wife states patient has also been hallucinating, seeing things that are not there. Per patient and wife, he ran out of Neurontin at  some point, unsure exactly when but thinks it was after his first seizure-like episode.    Last known normal: 07/20/2023 Event happened at home No tPA as outside window No thrombectomy as no large vessell occlusion mRS 0   ROS: All other systems reviewed and negative except as noted in the HPI.   Past Medical History:  Diagnosis Date   Anemia 06/18/2012   Anxiety    Arthritis    CAD (coronary artery disease)    Mild to moderate nonobstructive CAD at cardiac catheterization 2018   COPD (chronic obstructive pulmonary disease) (HCC)    Essential hypertension    Fibromyalgia    GERD (gastroesophageal reflux disease)    History of kidney stones    Opiate use    Palpitations    Peripheral neuropathy    Pulmonary nodule    Vitamin B 12 deficiency     Family History  Problem Relation Age of Onset   Fibromyalgia Mother    Bladder Cancer Father 15   Benign prostatic hyperplasia Father    Healthy Son    Heart failure Paternal Grandmother    Heart failure Paternal Grandfather    Lung cancer Paternal Grandfather    Lung cancer Maternal Uncle    Emphysema Maternal Uncle    Lung cancer Paternal Uncle    Lung cancer Maternal Grandmother    Stomach cancer Paternal Uncle     Social History:  reports that he has been smoking cigarettes. He started smoking about 44 years ago. He has a 36 pack-year smoking history. He has never  used smokeless tobacco. He reports that he does not drink alcohol and does not use drugs.  Medications Prior to Admission  Medication Sig Dispense Refill Last Dose/Taking   acetaminophen (TYLENOL) 500 MG tablet Take 2 tablets (1,000 mg total) by mouth every 6 (six) hours. 30 tablet 0 Past Month   albuterol (PROVENTIL HFA;VENTOLIN HFA) 108 (90 Base) MCG/ACT inhaler Inhale 2 puffs into the lungs every 4 (four) hours as needed for wheezing or shortness of breath. 1 Inhaler 6 Taking As Needed   ALPRAZolam (XANAX) 0.5 MG tablet Take 0.5 mg by mouth at bedtime as  needed for anxiety or sleep.   0 Past Week   gabapentin (NEURONTIN) 800 MG tablet TAKE 1 TABLET BY MOUTH THREE TIMES A DAY (Patient taking differently: Take 800 mg by mouth in the morning, at noon, in the evening, and at bedtime.) 270 tablet 0 07/30/2023   hydrochlorothiazide (HYDRODIURIL) 25 MG tablet Take 25 mg by mouth daily.   Taking   oxyCODONE-acetaminophen (PERCOCET) 10-325 MG tablet Take 1 tablet by mouth every 6 (six) hours.    07/31/2023   amLODipine (NORVASC) 10 MG tablet Take 1 tablet by mouth daily.         Exam: Current vital signs: BP 116/78 (BP Location: Right Arm)   Pulse 83   Temp 97.6 F (36.4 C) (Axillary)   Resp 20   Ht 5\' 9"  (1.753 m)   Wt 85.7 kg   SpO2 99%   BMI 27.90 kg/m  Vital signs in last 24 hours: Temp:  [97.6 F (36.4 C)-98.6 F (37 C)] 97.6 F (36.4 C) (01/24 0806) Pulse Rate:  [68-94] 83 (01/24 0806) Resp:  [14-22] 20 (01/24 0806) BP: (112-161)/(78-112) 116/78 (01/24 0806) SpO2:  [96 %-100 %] 99 % (01/24 0806) Weight:  [85.7 kg-90.7 kg] 85.7 kg (01/24 0017)   Physical Exam  Constitutional: Appears well-developed and well-nourished.  Psych: Affect appropriate to situation Neuro: AOx3, no aphasia or dysarthria, cranial nerves appear grossly intact, antigravity send without drift in upper extremities, sensory intact to light touch, FTN intact bilaterally  NIHSS 0  I have reviewed labs in epic and the results pertinent to this consultation are: CBC:  Recent Labs  Lab 07/31/23 2054 08/01/23 0407  WBC 8.6 8.5  HGB 16.7 15.0  HCT 50.1 44.8  MCV 96.9 95.3  PLT 360 324    Basic Metabolic Panel:  Lab Results  Component Value Date   NA 136 08/01/2023   K 3.2 (L) 08/01/2023   CO2 27 08/01/2023   GLUCOSE 91 08/01/2023   BUN 13 08/01/2023   CREATININE 0.88 08/01/2023   CALCIUM 8.9 08/01/2023   GFRNONAA >60 08/01/2023   GFRAA >60 09/09/2017   Lipid Panel:  Lab Results  Component Value Date   LDLCALC 62 08/01/2023   HgbA1c:  Lab  Results  Component Value Date   HGBA1C 5.5 06/05/2015   Urine Drug Screen:     Component Value Date/Time   LABOPIA POSITIVE (A) 06/18/2012 0454   COCAINSCRNUR NONE DETECTED 06/18/2012 0454   LABBENZ POSITIVE (A) 06/18/2012 0454   AMPHETMU NONE DETECTED 06/18/2012 0454   THCU NONE DETECTED 06/18/2012 0454   LABBARB NONE DETECTED 06/18/2012 0454    Alcohol Level     Component Value Date/Time   ETH <11 02/10/2012 1845     I have reviewed the images obtained:  CT head without contrast 07/31/2023: Area of hypoattenuation within the right occipital lobe concerning for acute or subacute infarct.   MRI  brain with and without contrast 07/31/2023: Evolving subacute ischemic infarct involving the right occipital lobe. Associated petechial blood products without frank hemorrhagic transformation or significant regional mass effect. Additional subcentimeter early subacute ischemic infarct involving the right frontal centrum semi ovale. Underlying mild chronic microvascular ischemic disease with remote left basal ganglia lacunar infarct.  MRA head without contrast 07/31/2023: Negative intracranial MRA for large vessel occlusion.  Focal severe proximal right M2 stenosis, inferior division. No other hemodynamically significant or correctable stenosis about the intracranial arterial circulation.  EKG 07/28/2023: Sinus rhythm     ASSESSMENT/PLAN: 58 year old male who presented with dizziness, confusion, seizure-like episodes.  Acute ischemic stroke Acute encephalopathy Hallucinations -MRI findings are not definitive for stroke.  Given patient's history, seizures is also in the differential.  If the patient did have seizures, we do not have any clear etiology for new onset seizures -Etiology: Most likely cardioembolic.  Also has right M2 stenosis  Recommendations: -Carotid ultrasound and TTE pending -If TTE negative, recommend cardiac monitor to look for paroxysmal A-fib -Recommend DAPT with  aspirin 81 mg daily and Plavix 75 mg daily for 3 months followed by monotherapy with aspirin 81 mg daily -LDL 62, therefore not starting statin -Goal blood pressure: Normotension.  Of note, due to severe M2 stenosis, please avoid hypotension -PT/OT -Stroke education -Recommend EEG to monitor for potential epileptogenicity -Resume home dose of gabapentin 800 mg 3 times daily for now -Due to new onset seizure-like activity, hallucinations and confusion, recommend obtaining fluoroscopy guided LP to rule out infection although low suspicion -UDS has been ordered and pending. -Follow-up with neurology in 3 months (order placed) -Plan discussed with patient at bedside -Plan discussed with Dr. Alanda Slim via secure chat   Thank you for allowing [place] to participate in the care of this patient. If you have any further questions, please contact  me or neurohospitalist.   Lindie Spruce [facility]ist   '

## 2023-08-01 NOTE — Procedures (Signed)
Patient Name: MATTHEO SWINDLE  MRN: [id number]  Epilepsy Attending: Charlsie Quest  Referring Physician/Provider: Charlsie Quest, MD  Date: 08/01/2023 Duration: 23.45 mins  Patient history: 58yo M with seizure like activity. EEG to evaluate for seizure   Level of alertness: Awake, asleep  AEDs during EEG study: GBP  Technical aspects: This EEG study was done with scalp electrodes positioned according to the 10-20 International system of electrode placement. Electrical activity was reviewed with band pass filter of 1-70Hz , sensitivity of 7 uV/mm, display speed of 63mm/sec with a 60Hz  notched filter applied as appropriate. EEG data were recorded continuously and digitally stored.  Video monitoring was available and reviewed as appropriate.  Description: The posterior dominant rhythm consists of 8-9 Hz activity of moderate voltage (25-35 uV) seen predominantly in posterior head regions, symmetric and reactive to eye opening and eye closing. Sleep was characterized by vertex waves, sleep spindles (12 to 14 Hz), maximal frontocentral region. Physiologic photic driving was not seen during photic stimulation.  Hyperventilation was not performed.      IMPRESSION: This study is within normal limits. No seizures or epileptiform discharges were seen throughout the recording.  A normal interictal EEG does not exclude the diagnosis of epilepsy.  Emilyrose Darrah Annabelle Harman

## 2023-08-01 NOTE — Plan of Care (Signed)
Problem: Education: Goal: Knowledge of General Education information will improve Description: Including pain rating scale, medication(s)/side effects and non-pharmacologic comfort measures Outcome: Progressing   Problem: Health Behavior/Discharge Planning: Goal: Ability to manage health-related needs will improve Outcome: Progressing   Problem: Clinical Measurements: Goal: Ability to maintain clinical measurements within normal limits will improve Outcome: Progressing   Problem: Activity: Goal: Risk for activity intolerance will decrease Outcome: Progressing   Problem: Nutrition: Goal: Adequate nutrition will be maintained Outcome: Progressing   Problem: Coping: Goal: Level of anxiety will decrease Outcome: Progressing   Problem: Safety: Goal: Ability to remain free from injury will improve Outcome: Progressing

## 2023-08-01 NOTE — Progress Notes (Signed)
Patient tolerated fluoro guided lumbar puncture procedure well today and 7mL of clear colorless CSF collected and sent to lab for processing. Patient verbalized understanding of post procedure instructions and transported via hospital bed back to inpatient bed assignment at this time with no acute distress noted.

## 2023-08-01 NOTE — TOC Transition Note (Signed)
Transition of Care [facility]) - Discharge Note   Patient Details  Name: Mathew Brady MRN: [id number] Date of Birth: 10/05/1965  [facility] of [facility]) CM/SW Contact:  Leitha Bleak, RN Phone Number: 08/01/2023, 1:30 PM   Clinical Narrative:   Patient admitted with Protrusion of lumbar intervertebral disc. PT is recommending out patient PT. Orders placed. Explain they will call to schedule an appointment [facility] [address] [facility]. Patient has PCP and Insurance. No other needs identified.     Discharge Placement            Discharge Plan and Services Additional resources added to the After Visit Summary for       Social Drivers of Health (SDOH) Interventions SDOH Screenings   Food Insecurity: No Food Insecurity (08/01/2023)  Housing: High Risk (08/01/2023)  Transportation Needs: No Transportation Needs (08/01/2023)  Utilities: Not At Risk (08/01/2023)  Financial Resource Strain: Low Risk  (12/29/2020)   Received from [facility]  Tobacco Use: High Risk (07/31/2023)

## 2023-08-01 NOTE — Discharge Summary (Signed)
Physician Discharge Summary  Mathew Brady NWG:[id number] DOB: 01/18/1966 DOA: 07/31/2023  PCP: Benita Stabile, MD  Admit date: 07/31/2023 Discharge date: 08/01/2023 Admitted From: Home Disposition: Home Recommendations for Outpatient Follow-up:  Follow up with PCP in 1 week Outpatient follow-up with neurology in 4 to 6 weeks Check blood pressure, CMP and CBC at follow-up Please follow up on the following pending results: CSF labs  Home Health: None Equipment/Devices: None  Discharge Condition: Stable CODE STATUS: Full code  Follow-up Information     Benita Stabile, MD. Schedule an appointment as soon as possible for a visit in 1 week(s).   Specialty: Internal Medicine Contact information: [address] [place] [address] [phone]         [facility]. Schedule an appointment as soon as possible for a visit.   Specialty: Radiology Contact information: [address] [place] [address] [phone]                Hospital course 58 year old M with PMH of COPD, HTN, anxiety, chronic back pain and tobacco use disorder noncompliant with medications presenting with lightheadedness for about 2 weeks, and admitted with uncontrolled hypertension and subacute right frontal and occipital CVA with petechial hemorrhage but no frank hemorrhagic transformation, and remote left basal ganglia lacunar infarct as noted on MRI brain.  MRA head without LVO but focal severe proximal right M2 stenosis.  Patient was admitted for CVA workup.  Neurology consulted, and recommended fluoroscopy guided lumbar puncture.   Patient underwent fluoroscopy guided lumbar puncture.  Preliminary result on CSF reassuring.  Echocardiogram and carotid ultrasound without significant finding.  EEG negative for seizure or epileptiform discharge.  UDS positive for amphetamine although patient denies using amphetamine iron products or over-the-counter  cold and flu meds recently.  Later in the afternoon, patient and family asked if they could be discharged.  Discussed with neurology who cleared patient for discharge on Plavix and aspirin for 3 months followed by aspirin alone.  CSF fluid studies pending.  See individual problem list below for more.   Problems addressed during this hospitalization Subacute CVA: Patient presented with lightheadedness but no focal neurodeficit.  MRI brain suggestive of subacute CVA.  Right frontal and occipital region.  MRA head without LVO but focal severe proximal right M2 stenosis.  Risk factor include HTN, smoking and noncompliance with meds.  His UDS is also positive for amphetamines although he denies recreational drug use or recent cold and flu medication.  EEG without epileptiform discharges or seizure.  TTE and carotid ultrasound unrevealing.  No history of diabetes.  LDL 62.  A1c 5.5%.  Preliminary result on CSF reassuring. -Neurology recommended Plavix and aspirin for 3 months followed by aspirin alone -Neurology also recommended avoiding hypotension.  HCTZ discontinued. -Counseled on the importance of compliance with medication and smoking cessation. -Discussed urine drug screen finding with patient.  -No need antibiotic by PT OT -Follow other CSF labs.   Uncontrolled hypertension: Likely due to noncompliance with meds.  Normotensive this morning. -Continue home amlodipine -Discontinued HCTZ.   Chronic COPD: Stable -Continue albuterol as needed -Encouraged smoking cessation.   Tobacco use disorder: Reports smoking about half a pack a day but wife thinks he smokes more. -Encourage smoking cessation -Nicotine patch   Anxiety: Stable.  On Xanax at home. -Continue Xanax per home regimen   Chronic right shoulder pain: On Percocet 10/325, gabapentin.  Narcotic database reviewed and confirmed. -Resume home  medications   Dizziness/lightheadedness: Could be due to subacute CVA and uncontrolled  hypertension.  Resolved. -Fall precaution            Time spent 35 minutes  Vital signs Vitals:   08/01/23 0017 08/01/23 0400 08/01/23 0806 08/01/23 1554  BP: 112/88 (!) 151/85 116/78 (!) 137/91  Pulse: 76 68 83 75  Temp: 98.2 F (36.8 C) 98.4 F (36.9 C) 97.6 F (36.4 C) 99 F (37.2 C)  Resp: 18  20   Height:      Weight: 85.7 kg     SpO2: 96% 96% 99% 99%  TempSrc:  Oral Axillary Oral  BMI (Calculated): 27.89        Discharge exam  GENERAL: No apparent distress.  Nontoxic. HEENT: MMM.  Vision and hearing grossly intact.  NECK: Supple.  No apparent JVD.  RESP:  No IWOB.  Fair aeration bilaterally. CVS:  RRR. Heart sounds normal.  ABD/GI/GU: BS+. Abd soft, NTND.  MSK/EXT:  Moves extremities. No apparent deformity. No edema.  SKIN: no apparent skin lesion or wound NEURO: Awake, alert and oriented appropriately. Speech clear. Cranial nerves II-XII intact. Motor 5/5 in all muscle groups of UE and LE bilaterally, Normal tone. Light sensation intact in all dermatomes of upper and lower ext bilaterally. Patellar reflex symmetric.  No pronator drift.  Finger to nose intact. PSYCH: Calm. Normal affect.   Discharge Instructions Discharge Instructions     Ambulatory referral to Physical Therapy   Complete by: As directed    Diet - low sodium heart healthy   Complete by: As directed    Discharge instructions   Complete by: As directed    It has been a pleasure taking care of you!  You were hospitalized due to dizziness and elevated blood pressure.  Your MRI showed stroke on the right side of your brain.  We have started you on Plavix and aspirin to reduce your risk of future stroke.  Is also very important that you take your medications as prescribed and quit smoking cigarettes.  Your urine drug screen showed amphetamine.  This could happen if you have taken over-the-counter cold and flu medications or recreational meds.  It can increase your risk of having a stroke and  other complications.  It is important that you quit smoking cigarettes.  You may use nicotine patch to help you quit smoking.  Nicotine patch is available over-the-counter.  You may also discuss other options to help you quit smoking with your primary care doctor. You can also talk to professional counselors at 1-800-QUIT-NOW [phone]) for free smoking cessation counseling.  Follow-up with your primary care doctor in 1 to 2 weeks or sooner if needed  Follow-up with neurology in 4 to 6 weeks   Take care,   Increase activity slowly   Complete by: As directed       Allergies as of 08/01/2023       Reactions   Succinylcholine Anaphylaxis   Pregabalin Other (See Comments), Photosensitivity        Medication List     STOP taking these medications    hydrochlorothiazide 25 MG tablet Commonly known as: HYDRODIURIL       TAKE these medications    acetaminophen 500 MG tablet Commonly known as: TYLENOL Take 2 tablets (1,000 mg total) by mouth every 6 (six) hours.   albuterol 108 (90 Base) MCG/ACT inhaler Commonly known as: VENTOLIN HFA Inhale 2 puffs into the lungs every 4 (four) hours as needed for  wheezing or shortness of breath.   ALPRAZolam 0.5 MG tablet Commonly known as: XANAX Take 0.5 mg by mouth at bedtime as needed for anxiety or sleep.   amLODipine 10 MG tablet Commonly known as: NORVASC Take 1 tablet by mouth daily.   aspirin EC 81 MG tablet Take 1 tablet (81 mg total) by mouth daily. Swallow whole. Start taking on: August 02, 2023   clopidogrel 75 MG tablet Commonly known as: PLAVIX Take 1 tablet (75 mg total) by mouth daily.   gabapentin 800 MG tablet Commonly known as: NEURONTIN Take 1 tablet (800 mg total) by mouth 3 (three) times daily. What changed: when to take this   nicotine 21 mg/24hr patch Commonly known as: NICODERM CQ - dosed in mg/24 hours Place 1 patch (21 mg total) onto the skin daily. Start taking on: August 02, 2023    oxyCODONE-acetaminophen 10-325 MG tablet Commonly known as: PERCOCET Take 1 tablet by mouth every 6 (six) hours.        Consultations: Neurology Radiology  Procedures/Studies:   ECHOCARDIOGRAM COMPLETE Result Date: 08/01/2023    ECHOCARDIOGRAM REPORT   Patient Name:   Mathew Brady Artley Date of Exam: 08/01/2023 Medical Rec #:  [id number]   Height:       69.0 in Accession #:    [id number]  Weight:       188.9 lb Date of Birth:  1966/01/25    BSA:          2.017 m Patient Age:    57 years    BP:           116/78 mmHg Patient Gender: M           HR:           83 bpm. [facility] Procedure: 2D Echo, Cardiac Doppler and Color Doppler Indications:    Stroke l63.9  History:        Patient has no prior history of Echocardiogram examinations.                 COPD; Risk Factors:Hypertension, Dyslipidemia, Current Smoker                 and Prediabetes. Hx of polysubstance dependence.  Sonographer:    Celesta Gentile RCS Referring Phys: [id number] OLADAPO ADEFESO IMPRESSIONS  1. Left ventricular ejection fraction, by estimation, is 60 to 65%. The left ventricle has normal function. The left ventricle has no regional wall motion abnormalities. There is mild left ventricular hypertrophy. Left ventricular diastolic parameters are consistent with Grade I diastolic dysfunction (impaired relaxation).  2. Right ventricular systolic function is normal. The right ventricular size is normal. Tricuspid regurgitation signal is inadequate for assessing PA pressure.  3. The mitral valve is normal in structure. No evidence of mitral valve regurgitation. No evidence of mitral stenosis.  4. The aortic valve is tricuspid. Aortic valve regurgitation is not visualized. No aortic stenosis is present.  5. The inferior vena cava is normal in size with greater than 50% respiratory variability, suggesting right atrial pressure of 3 mmHg. Comparison(s): No prior Echocardiogram. FINDINGS  Left Ventricle: Left ventricular ejection  fraction, by estimation, is 60 to 65%. The left ventricle has normal function. The left ventricle has no regional wall motion abnormalities. The left ventricular internal cavity size was normal in size. There is  mild left ventricular hypertrophy. Left ventricular diastolic parameters are consistent with Grade I diastolic dysfunction (impaired relaxation). Normal left ventricular filling pressure. Right Ventricle:  The right ventricular size is normal. No increase in right ventricular wall thickness. Right ventricular systolic function is normal. Tricuspid regurgitation signal is inadequate for assessing PA pressure. Left Atrium: Left atrial size was normal in size. Right Atrium: Right atrial size was normal in size. Pericardium: There is no evidence of pericardial effusion. Mitral Valve: The mitral valve is normal in structure. No evidence of mitral valve regurgitation. No evidence of mitral valve stenosis. Tricuspid Valve: The tricuspid valve is normal in structure. Tricuspid valve regurgitation is not demonstrated. No evidence of tricuspid stenosis. Aortic Valve: The aortic valve is tricuspid. Aortic valve regurgitation is not visualized. No aortic stenosis is present. Aortic valve mean gradient measures 6.0 mmHg. Aortic valve peak gradient measures 11.4 mmHg. Aortic valve area, by VTI measures 2.07  cm. Pulmonic Valve: The pulmonic valve was grossly normal. Pulmonic valve regurgitation is trivial. No evidence of pulmonic stenosis. Aorta: The aortic root is normal in size and structure. Venous: The inferior vena cava is normal in size with greater than 50% respiratory variability, suggesting right atrial pressure of 3 mmHg. IAS/Shunts: The interatrial septum was not well visualized.  LEFT VENTRICLE PLAX 2D LVIDd:         4.90 cm   Diastology LVIDs:         3.10 cm   LV e' medial:    6.42 cm/s LV PW:         1.20 cm   LV E/e' medial:  11.2 LV IVS:        1.20 cm   LV e' lateral:   7.72 cm/s LVOT diam:     2.00 cm    LV E/e' lateral: 9.3 LV SV:         64 LV SV Index:   32 LVOT Area:     3.14 cm  RIGHT VENTRICLE RV S prime:     11.90 cm/s TAPSE (M-mode): 1.6 cm LEFT ATRIUM             Index        RIGHT ATRIUM           Index LA diam:        3.30 cm 1.64 cm/m   RA Area:     14.00 cm LA Vol (A2C):   71.2 ml 35.31 ml/m  RA Volume:   32.50 ml  16.12 ml/m LA Vol (A4C):   51.2 ml 25.39 ml/m LA Biplane Vol: 63.9 ml 31.69 ml/m  AORTIC VALVE AV Area (Vmax):    2.01 cm AV Area (Vmean):   2.20 cm AV Area (VTI):     2.07 cm AV Vmax:           169.00 cm/s AV Vmean:          107.000 cm/s AV VTI:            0.308 m AV Peak Grad:      11.4 mmHg AV Mean Grad:      6.0 mmHg LVOT Vmax:         108.00 cm/s LVOT Vmean:        75.100 cm/s LVOT VTI:          0.203 m LVOT/AV VTI ratio: 0.66  AORTA Ao Root diam: 3.70 cm MITRAL VALVE MV Area (PHT): 2.26 cm    SHUNTS MV Decel Time: 335 msec    Systemic VTI:  0.20 m MV E velocity: 72.00 cm/s  Systemic Diam: 2.00 cm MV A velocity: 84.00 cm/s MV E/A ratio:  0.86 Vishnu Priya Mallipeddi Electronically signed by Winfield Rast Mallipeddi Signature Date/Time: 08/01/2023/3:54:17 PM    Final    EEG adult Result Date: 08/01/2023 Charlsie Quest, MD     08/01/2023  3:42 PM Patient Name: Mathew Brady MRN: [id number] Epilepsy Attending: Charlsie Quest Referring Physician/Provider: Charlsie Quest, MD Date: 08/01/2023 Duration: 23.45 mins Patient history: 58yo M with seizure like activity. EEG to evaluate for seizure Level of alertness: Awake, asleep AEDs during EEG study: GBP Technical aspects: This EEG study was done with scalp electrodes positioned according to the 10-20 International system of electrode placement. Electrical activity was reviewed with band pass filter of 1-70Hz , sensitivity of 7 uV/mm, display speed of 28mm/sec with a 60Hz  notched filter applied as appropriate. EEG data were recorded continuously and digitally stored.  Video monitoring was available and reviewed as appropriate.  Description: The posterior dominant rhythm consists of 8-9 Hz activity of moderate voltage (25-35 uV) seen predominantly in posterior head regions, symmetric and reactive to eye opening and eye closing. Sleep was characterized by vertex waves, sleep spindles (12 to 14 Hz), maximal frontocentral region. Physiologic photic driving was not seen during photic stimulation.  Hyperventilation was not performed.   IMPRESSION: This study is within normal limits. No seizures or epileptiform discharges were seen throughout the recording. A normal interictal EEG does not exclude the diagnosis of epilepsy. Charlsie Quest   US Carotid Bilateral Result Date: 08/01/2023 CLINICAL DATA:  Hypertension, stroke EXAM: BILATERAL CAROTID DUPLEX ULTRASOUND TECHNIQUE: Wallace Cullens scale imaging, color Doppler and duplex ultrasound were performed of bilateral carotid and vertebral arteries in the neck. COMPARISON:  12/04/2016 FINDINGS: Criteria: Quantification of carotid stenosis is based on velocity parameters that correlate the residual internal carotid diameter with NASCET-based stenosis levels, using the diameter of the distal internal carotid lumen as the denominator for stenosis measurement. The following velocity measurements were obtained: RIGHT ICA: 143/20 cm/sec CCA: 148/22 cm/sec SYSTOLIC ICA/CCA RATIO:  1.1 ECA: 183 cm/sec LEFT ICA: 175/29 cm/sec CCA: 170/26 cm/sec SYSTOLIC ICA/CCA RATIO:  1.1 ECA: 181 cm/sec RIGHT CAROTID ARTERY: Intimal thickening in the common carotid artery. Mild eccentric partially calcified plaque in the bulb. No high-grade stenosis. Normal waveforms and color Doppler signal. RIGHT VERTEBRAL ARTERY:  Normal flow direction and waveform. LEFT CAROTID ARTERY: Mild intimal thickening in the common carotid artery. Eccentric partially calcified plaque at the carotid bifurcation extending into proximal ICA resulting in at least mild stenosis. Distal ICA tortuous. LEFT VERTEBRAL ARTERY:  Normal flow direction and  waveform. IMPRESSION: 1. Bilateral carotid bifurcation plaque resulting in less than 50% diameter ICA stenosis. 2. Antegrade bilateral vertebral arterial flow. Electronically Signed   By: Corlis Leak M.D.   On: 08/01/2023 14:50   DG FL GUIDED LUMBAR PUNCTURE Result Date: 08/01/2023 CLINICAL DATA:  Encephalopathy EXAM: LUMBAR PUNCTURE UNDER FLUOROSCOPY TECHNIQUE: An appropriate skin entry site was determined fluoroscopically. Operator donned sterile gloves and mask. Skin site was marked, then prepped with Betadine, draped in usual sterile fashion, and infiltrated locally with 1% lidocaine. A 20 gauge spinal needle advanced into the thecal sac at L3 from a left parasagittal interlaminar approach. Clear colorless CSF spontaneously returned. Approximately 6ml CSF were collected and divided among 4 sterile vials for the requested laboratory studies. The needle was then removed. FLUOROSCOPY: Radiation Exposure Index (as provided by the fluoroscopic device): 2.4 mGy air Kerma COMPLICATIONS: None immediate IMPRESSION: 1. Technically successful lumbar puncture under fluoroscopy. Electronically Signed   By: Corlis Leak M.D.   On:  08/01/2023 14:46   MR ANGIO HEAD WO CONTRAST Result Date: 07/31/2023 CLINICAL DATA:  Initial evaluation for neuro deficit, stroke suspected. EXAM: MRI HEAD WITHOUT AND WITH CONTRAST MRA HEAD WITHOUT CONTRAST TECHNIQUE: Multiplanar, multi-echo pulse sequences of the brain and surrounding structures were acquired without and with intravenous contrast. Angiographic images of the Circle of Willis were acquired using MRA technique without intravenous contrast. CONTRAST:  9mL GADAVIST GADOBUTROL 1 MMOL/ML IV SOLN COMPARISON:  CT from earlier the same day. FINDINGS: MRI HEAD FINDINGS Brain: Cerebral volume within normal limits. Mild chronic microvascular ischemic disease for age. Remote lacunar infarct noted at the left basal ganglia. Area of evolving encephalomalacia and gliosis noted involving the  right occipital lobe. Associated diffusion signal abnormality at this level without ADC correlate. Heterogeneous postcontrast enhancement. Finding consistent with an evolving subacute ischemic infarct. Associated petechial blood products at this location without frank hemorrhagic transformation or significant regional mass effect. An additional subcentimeter focus of diffusion signal abnormality noted at the right frontal centrum semi ovale superiorly (series 5, image 27), consistent with an early subacute ischemic infarct. Minimal associated petechial blood product noted at this location as well without mass effect. Gray-white matter differentiation otherwise maintained. No other acute or chronic intracranial blood products. No mass lesion or midline shift. No hydrocephalus or extra-axial fluid collection. Pituitary gland suprasellar region within normal limits. No abnormal enhancement. Vascular: Major intracranial vascular flow voids are maintained. Skull and upper cervical spine: Craniocervical junctional limits. Bone marrow signal intensity normal. Localized thickening of the left parietal calvarium without discrete lesion noted. No scalp soft tissue abnormality. Sinuses/Orbits: Globes orbital soft tissues within normal limits. Paranasal sinuses are largely clear. No significant mastoid effusion. Other: None. MRA HEAD FINDINGS Anterior circulation: Posterior circulation: Both V4 segments patent without stenosis. Both PICA patent. Basilar patent without stenosis. Superior cerebral arteries patent bilaterally. Both PCAs primarily supplied via the basilar. PCAs are patent to their distal aspects without significant stenosis. Anatomic variants: None significant.  No aneurysm. IMPRESSION: MRI HEAD: 1. Evolving subacute ischemic infarct involving the right occipital lobe. Associated petechial blood products without frank hemorrhagic transformation or significant regional mass effect. 2. Additional subcentimeter early  subacute ischemic infarct involving the right frontal centrum semi ovale. 3. Underlying mild chronic microvascular ischemic disease with remote left basal ganglia lacunar infarct. MRA HEAD: 1. Negative intracranial MRA for large vessel occlusion. 2. Focal severe proximal right M2 stenosis, inferior division. 3. No other hemodynamically significant or correctable stenosis about the intracranial arterial circulation. Electronically Signed   By: Rise Mu M.D.   On: 07/31/2023 22:57   MR BRAIN W WO CONTRAST Result Date: 07/31/2023 CLINICAL DATA:  Initial evaluation for neuro deficit, stroke suspected. EXAM: MRI HEAD WITHOUT AND WITH CONTRAST MRA HEAD WITHOUT CONTRAST TECHNIQUE: Multiplanar, multi-echo pulse sequences of the brain and surrounding structures were acquired without and with intravenous contrast. Angiographic images of the Circle of Willis were acquired using MRA technique without intravenous contrast. CONTRAST:  9mL GADAVIST GADOBUTROL 1 MMOL/ML IV SOLN COMPARISON:  CT from earlier the same day. FINDINGS: MRI HEAD FINDINGS Brain: Cerebral volume within normal limits. Mild chronic microvascular ischemic disease for age. Remote lacunar infarct noted at the left basal ganglia. Area of evolving encephalomalacia and gliosis noted involving the right occipital lobe. Associated diffusion signal abnormality at this level without ADC correlate. Heterogeneous postcontrast enhancement. Finding consistent with an evolving subacute ischemic infarct. Associated petechial blood products at this location without frank hemorrhagic transformation or significant regional mass effect. An additional  subcentimeter focus of diffusion signal abnormality noted at the right frontal centrum semi ovale superiorly (series 5, image 27), consistent with an early subacute ischemic infarct. Minimal associated petechial blood product noted at this location as well without mass effect. Gray-white matter differentiation  otherwise maintained. No other acute or chronic intracranial blood products. No mass lesion or midline shift. No hydrocephalus or extra-axial fluid collection. Pituitary gland suprasellar region within normal limits. No abnormal enhancement. Vascular: Major intracranial vascular flow voids are maintained. Skull and upper cervical spine: Craniocervical junctional limits. Bone marrow signal intensity normal. Localized thickening of the left parietal calvarium without discrete lesion noted. No scalp soft tissue abnormality. Sinuses/Orbits: Globes orbital soft tissues within normal limits. Paranasal sinuses are largely clear. No significant mastoid effusion. Other: None. MRA HEAD FINDINGS Anterior circulation: Posterior circulation: Both V4 segments patent without stenosis. Both PICA patent. Basilar patent without stenosis. Superior cerebral arteries patent bilaterally. Both PCAs primarily supplied via the basilar. PCAs are patent to their distal aspects without significant stenosis. Anatomic variants: None significant.  No aneurysm. IMPRESSION: MRI HEAD: 1. Evolving subacute ischemic infarct involving the right occipital lobe. Associated petechial blood products without frank hemorrhagic transformation or significant regional mass effect. 2. Additional subcentimeter early subacute ischemic infarct involving the right frontal centrum semi ovale. 3. Underlying mild chronic microvascular ischemic disease with remote left basal ganglia lacunar infarct. MRA HEAD: 1. Negative intracranial MRA for large vessel occlusion. 2. Focal severe proximal right M2 stenosis, inferior division. 3. No other hemodynamically significant or correctable stenosis about the intracranial arterial circulation. Electronically Signed   By: Rise Mu M.D.   On: 07/31/2023 22:57   CT Head Wo Contrast Result Date: 07/31/2023 CLINICAL DATA:  Memory loss, body tremors, tingling and burning sensation in legs and arms. EXAM: CT HEAD WITHOUT  CONTRAST TECHNIQUE: Contiguous axial images were obtained from the base of the skull through the vertex without intravenous contrast. RADIATION DOSE REDUCTION: This exam was performed according to the departmental dose-optimization program which includes automated exposure control, adjustment of the mA and/or kV according to patient size and/or use of iterative reconstruction technique. COMPARISON:  CT 02/22/2017 FINDINGS: Brain: Area of hypoattenuation within the right occipital lobe (series 2/image 17) concerning for acute or subacute infarct. No intracranial hemorrhage. No hydrocephalus. No mass effect. Basal cisterns are patent. Vascular: No hyperdense vessel or unexpected calcification. Skull: Normal. Negative for fracture or focal lesion. Sinuses/Orbits: No acute finding. Other: None. IMPRESSION: Area of hypoattenuation within the right occipital lobe concerning for acute or subacute infarct. MRI is recommended for further evaluation. Critical Value/emergent results were called by telephone at the time of interpretation on 07/31/2023 at 8:53 pm to provider ANDREW TEE , who verbally acknowledged these results. Electronically Signed   By: Minerva Fester M.D.   On: 07/31/2023 21:05       The results of significant diagnostics from this hospitalization (including imaging, microbiology, ancillary and laboratory) are listed below for reference.     Microbiology: Recent Results (from the past 240 hours)  CSF culture w Gram Stain     Status: None (Preliminary result)   Collection Time: 08/01/23  1:33 PM   Specimen: CSF; Cerebrospinal Fluid  Result Value Ref Range Status   Specimen Description CSF  Final   Special Requests Normal  Final   Gram Stain   Final    NO ORGANISMS SEEN NO WBC SEEN CYTOSPIN SMEAR Performed at [facility], [address], [place], [place] [address]    Culture PENDING  Incomplete   Report Status PENDING  Incomplete     Labs:  CBC: Recent Labs  Lab 07/31/23 2054  08/01/23 0407  WBC 8.6 8.5  HGB 16.7 15.0  HCT 50.1 44.8  MCV 96.9 95.3  PLT 360 324   BMP &GFR Recent Labs  Lab 07/31/23 2054 08/01/23 0407  NA 136 136  K 4.0 3.2*  CL 95* 100  CO2 27 27  GLUCOSE 71 91  BUN 9 13  CREATININE 1.02 0.88  CALCIUM 9.7 8.9  MG  --  2.5*  PHOS  --  2.8   Estimated Creatinine Clearance: 99.3 mL/min (by C-G formula based on SCr of 0.88 mg/dL). Liver & Pancreas: Recent Labs  Lab 08/01/23 0407  AST 14*  ALT 16  ALKPHOS 72  BILITOT 0.3  PROT 6.4*  ALBUMIN 3.8   No results for input(s): "LIPASE", "AMYLASE" in the last 168 hours. No results for input(s): "AMMONIA" in the last 168 hours. Diabetic: Recent Labs    07/31/23 2054  HGBA1C 5.5   No results for input(s): "GLUCAP" in the last 168 hours. Cardiac Enzymes: No results for input(s): "CKTOTAL", "CKMB", "CKMBINDEX", "TROPONINI" in the last 168 hours. No results for input(s): "PROBNP" in the last 8760 hours. Coagulation Profile: No results for input(s): "INR", "PROTIME" in the last 168 hours. Thyroid Function Tests: No results for input(s): "TSH", "T4TOTAL", "FREET4", "T3FREE", "THYROIDAB" in the last 72 hours. Lipid Profile: Recent Labs    08/01/23 0407  CHOL 136  HDL 28*  LDLCALC 62  TRIG 578*  CHOLHDL 4.9   Anemia Panel: No results for input(s): "VITAMINB12", "FOLATE", "FERRITIN", "TIBC", "IRON", "RETICCTPCT" in the last 72 hours. Urine analysis:    Component Value Date/Time   COLORURINE YELLOW 03/26/2018 1330   APPEARANCEUR CLEAR 03/26/2018 1330   LABSPEC 1.010 03/26/2018 1330   PHURINE 6.0 03/26/2018 1330   GLUCOSEU NEGATIVE 03/26/2018 1330   HGBUR NEGATIVE 03/26/2018 1330   BILIRUBINUR NEGATIVE 03/26/2018 1330   KETONESUR NEGATIVE 03/26/2018 1330   PROTEINUR NEGATIVE 03/26/2018 1330   UROBILINOGEN 0.2 04/20/2014 1141   NITRITE NEGATIVE 03/26/2018 1330   LEUKOCYTESUR NEGATIVE 03/26/2018 1330   Sepsis Labs: Invalid input(s): "PROCALCITONIN",  "LACTICIDVEN"   SIGNED:  Almon Hercules, MD  [facility] 08/01/2023, 6:35 PM

## 2023-08-01 NOTE — Progress Notes (Signed)
PROGRESS NOTE  Mathew Brady:[id number] DOB: 09-06-1965   PCP: Benita Stabile, MD  Patient is from: Home.  Lives with his wife.  Independently ambulates at baseline.  DOA: 07/31/2023 LOS: 0  Chief complaints Chief Complaint  Patient presents with   Tremors     Brief Narrative / Interim history: 58 year old M with PMH of COPD, HTN, anxiety, chronic back pain and tobacco use disorder noncompliant with medications presenting with lightheadedness for about 2 weeks, and admitted with uncontrolled hypertension and subacute right frontal and occipital CVA with petechial hemorrhage but no frank hemorrhagic transformation, and remote left basal ganglia lacunar infarct as noted on MRI brain.  MRA head without LVO.  Patient was admitted for CVA workup.  Neurology consulted, and recommended fluoroscopy guided lumbar puncture.   Subjective: Seen and examined earlier this morning.  No major events overnight of this morning.  Has no complaint this morning other than chronic right shoulder pain.  He is in the process of getting MRI outpatient.  He denies headache, vision change, ear pain, tinnitus, focal numbness, weakness or tingling.  Denies chest pain or dyspnea.  Denies GI or UTI symptoms.  Admits to noncompliance with medications.  Reports smoking about half a pack a day since she was a teenager.  However, wife thinks he smokes more.  Objective: Vitals:   07/31/23 2332 08/01/23 0017 08/01/23 0400 08/01/23 0806  BP:  112/88 (!) 151/85 116/78  Pulse:  76 68 83  Resp:  18  20  Temp: 98.5 F (36.9 C) 98.2 F (36.8 C) 98.4 F (36.9 C) 97.6 F (36.4 C)  TempSrc: Oral  Oral Axillary  SpO2:  96% 96% 99%  Weight:  85.7 kg    Height:        Examination:  GENERAL: No apparent distress.  Nontoxic. HEENT: MMM.  Vision and hearing grossly intact.  NECK: Supple.  No apparent JVD.  RESP:  No IWOB.  Fair aeration bilaterally. CVS:  RRR. Heart sounds normal.  ABD/GI/GU: BS+. Abd soft, NTND.  MSK/EXT:   Moves extremities. No apparent deformity. No edema.  SKIN: no apparent skin lesion or wound NEURO: Awake, alert and oriented appropriately.  No apparent focal neuro deficit. PSYCH: Calm. Normal affect.   Procedures:  None  Microbiology summarized: None  Assessment and plan: Possible subacute CVA: Patient presented with lightheadedness but no focal neurodeficit.  MRI brain suggestive of subacute CVA.  Right frontal and occipital region.  MRA head without LVO.  Risk factor include HTN, smoking and noncompliance with meds.  No history of diabetes.  LDL 62. -Follow echocardiogram and carotid ultrasound -Appreciate recommendation by neurology-recommended fluoroscopy guided lumbar puncture -Continue low-dose aspirin. -Follow hemoglobin A1c -Check urine drug screen -Optimize blood pressure control.  Not in the window for permissive hypertension. -PT/OT eval  Uncontrolled hypertension: Likely due to noncompliance with meds.  Normotensive this morning. -Continue home amlodipine -Hold HCTZ in the setting of normotension and hypokalemia.  Chronic COPD: Stable -Continue albuterol as needed -Encouraged smoking cessation.  Tobacco use disorder -Encourage smoking cessation -Nicotine patch  Anxiety: Stable.  On Xanax at home. -Continue Xanax per home regimen  Chronic right shoulder pain: On Percocet 10/325, gabapentin.  Narcotic database reviewed and confirmed. -Resume home medications  Dizziness/lightheadedness: Could be due to subacute CVA and uncontrolled hypertension.  Resolved. -Fall precaution  Body mass index is 27.9 kg/m.          DVT prophylaxis:  enoxaparin (LOVENOX) injection 40 mg Start: 08/01/23 1000 SCDs  Start: 08/01/23 0252  Code Status: Full code Family Communication: Updated patient's wife at bedside Level of care: Telemetry Status is: Observation The patient will require care spanning > 2 midnights and should be moved to inpatient because: Subacute stroke  and further workup for abnormal MRI brain   Final disposition: Likely home once medically cleared Consultants:  Neurology Radiology  55 minutes with more than 50% spent in reviewing records, counseling patient/family and coordinating care.   Sch Meds:  Scheduled Meds:  amLODipine  10 mg Oral Daily   aspirin EC  81 mg Oral Daily   enoxaparin (LOVENOX) injection  40 mg Subcutaneous Q24H   gabapentin  800 mg Oral TID   meclizine  25 mg Oral Once   nicotine  21 mg Transdermal Daily   oxyCODONE-acetaminophen  1 tablet Oral Q6H   potassium chloride  40 mEq Oral Q4H   Continuous Infusions: PRN Meds:.acetaminophen **OR** acetaminophen, albuterol, ALPRAZolam, ondansetron **OR** ondansetron (ZOFRAN) IV  Antimicrobials: Anti-infectives (From admission, onward)    Start     Dose/Rate Route Frequency Ordered Stop   07/31/23 2030  amoxicillin-clavulanate (AUGMENTIN) 875-125 MG per tablet 1 tablet        1 tablet Oral  Once 07/31/23 2027 07/31/23 2118        I have personally reviewed the following labs and images: CBC: Recent Labs  Lab 07/31/23 2054 08/01/23 0407  WBC 8.6 8.5  HGB 16.7 15.0  HCT 50.1 44.8  MCV 96.9 95.3  PLT 360 324   BMP &GFR Recent Labs  Lab 07/31/23 2054 08/01/23 0407  NA 136 136  K 4.0 3.2*  CL 95* 100  CO2 27 27  GLUCOSE 71 91  BUN 9 13  CREATININE 1.02 0.88  CALCIUM 9.7 8.9  MG  --  2.5*  PHOS  --  2.8   Estimated Creatinine Clearance: 99.3 mL/min (by C-G formula based on SCr of 0.88 mg/dL). Liver & Pancreas: Recent Labs  Lab 08/01/23 0407  AST 14*  ALT 16  ALKPHOS 72  BILITOT 0.3  PROT 6.4*  ALBUMIN 3.8   No results for input(s): "LIPASE", "AMYLASE" in the last 168 hours. No results for input(s): "AMMONIA" in the last 168 hours. Diabetic: No results for input(s): "HGBA1C" in the last 72 hours. No results for input(s): "GLUCAP" in the last 168 hours. Cardiac Enzymes: No results for input(s): "CKTOTAL", "CKMB", "CKMBINDEX",  "TROPONINI" in the last 168 hours. No results for input(s): "PROBNP" in the last 8760 hours. Coagulation Profile: No results for input(s): "INR", "PROTIME" in the last 168 hours. Thyroid Function Tests: No results for input(s): "TSH", "T4TOTAL", "FREET4", "T3FREE", "THYROIDAB" in the last 72 hours. Lipid Profile: Recent Labs    08/01/23 0407  CHOL 136  HDL 28*  LDLCALC 62  TRIG 829*  CHOLHDL 4.9   Anemia Panel: No results for input(s): "VITAMINB12", "FOLATE", "FERRITIN", "TIBC", "IRON", "RETICCTPCT" in the last 72 hours. Urine analysis:    Component Value Date/Time   COLORURINE YELLOW 03/26/2018 1330   APPEARANCEUR CLEAR 03/26/2018 1330   LABSPEC 1.010 03/26/2018 1330   PHURINE 6.0 03/26/2018 1330   GLUCOSEU NEGATIVE 03/26/2018 1330   HGBUR NEGATIVE 03/26/2018 1330   BILIRUBINUR NEGATIVE 03/26/2018 1330   KETONESUR NEGATIVE 03/26/2018 1330   PROTEINUR NEGATIVE 03/26/2018 1330   UROBILINOGEN 0.2 04/20/2014 1141   NITRITE NEGATIVE 03/26/2018 1330   LEUKOCYTESUR NEGATIVE 03/26/2018 1330   Sepsis Labs: Invalid input(s): "PROCALCITONIN", "LACTICIDVEN"  Microbiology: No results found for this or any previous visit (  from the past 240 hours).  Radiology Studies: MR ANGIO HEAD WO CONTRAST Result Date: 07/31/2023 CLINICAL DATA:  Initial evaluation for neuro deficit, stroke suspected. EXAM: MRI HEAD WITHOUT AND WITH CONTRAST MRA HEAD WITHOUT CONTRAST TECHNIQUE: Multiplanar, multi-echo pulse sequences of the brain and surrounding structures were acquired without and with intravenous contrast. Angiographic images of the Circle of Willis were acquired using MRA technique without intravenous contrast. CONTRAST:  9mL GADAVIST GADOBUTROL 1 MMOL/ML IV SOLN COMPARISON:  CT from earlier the same day. FINDINGS: MRI HEAD FINDINGS Brain: Cerebral volume within normal limits. Mild chronic microvascular ischemic disease for age. Remote lacunar infarct noted at the left basal ganglia. Area of  evolving encephalomalacia and gliosis noted involving the right occipital lobe. Associated diffusion signal abnormality at this level without ADC correlate. Heterogeneous postcontrast enhancement. Finding consistent with an evolving subacute ischemic infarct. Associated petechial blood products at this location without frank hemorrhagic transformation or significant regional mass effect. An additional subcentimeter focus of diffusion signal abnormality noted at the right frontal centrum semi ovale superiorly (series 5, image 27), consistent with an early subacute ischemic infarct. Minimal associated petechial blood product noted at this location as well without mass effect. Gray-white matter differentiation otherwise maintained. No other acute or chronic intracranial blood products. No mass lesion or midline shift. No hydrocephalus or extra-axial fluid collection. Pituitary gland suprasellar region within normal limits. No abnormal enhancement. Vascular: Major intracranial vascular flow voids are maintained. Skull and upper cervical spine: Craniocervical junctional limits. Bone marrow signal intensity normal. Localized thickening of the left parietal calvarium without discrete lesion noted. No scalp soft tissue abnormality. Sinuses/Orbits: Globes orbital soft tissues within normal limits. Paranasal sinuses are largely clear. No significant mastoid effusion. Other: None. MRA HEAD FINDINGS Anterior circulation: Posterior circulation: Both V4 segments patent without stenosis. Both PICA patent. Basilar patent without stenosis. Superior cerebral arteries patent bilaterally. Both PCAs primarily supplied via the basilar. PCAs are patent to their distal aspects without significant stenosis. Anatomic variants: None significant.  No aneurysm. IMPRESSION: MRI HEAD: 1. Evolving subacute ischemic infarct involving the right occipital lobe. Associated petechial blood products without frank hemorrhagic transformation or  significant regional mass effect. 2. Additional subcentimeter early subacute ischemic infarct involving the right frontal centrum semi ovale. 3. Underlying mild chronic microvascular ischemic disease with remote left basal ganglia lacunar infarct. MRA HEAD: 1. Negative intracranial MRA for large vessel occlusion. 2. Focal severe proximal right M2 stenosis, inferior division. 3. No other hemodynamically significant or correctable stenosis about the intracranial arterial circulation. Electronically Signed   By: Rise Mu M.D.   On: 07/31/2023 22:57   MR BRAIN W WO CONTRAST Result Date: 07/31/2023 CLINICAL DATA:  Initial evaluation for neuro deficit, stroke suspected. EXAM: MRI HEAD WITHOUT AND WITH CONTRAST MRA HEAD WITHOUT CONTRAST TECHNIQUE: Multiplanar, multi-echo pulse sequences of the brain and surrounding structures were acquired without and with intravenous contrast. Angiographic images of the Circle of Willis were acquired using MRA technique without intravenous contrast. CONTRAST:  9mL GADAVIST GADOBUTROL 1 MMOL/ML IV SOLN COMPARISON:  CT from earlier the same day. FINDINGS: MRI HEAD FINDINGS Brain: Cerebral volume within normal limits. Mild chronic microvascular ischemic disease for age. Remote lacunar infarct noted at the left basal ganglia. Area of evolving encephalomalacia and gliosis noted involving the right occipital lobe. Associated diffusion signal abnormality at this level without ADC correlate. Heterogeneous postcontrast enhancement. Finding consistent with an evolving subacute ischemic infarct. Associated petechial blood products at this location without frank hemorrhagic transformation or significant regional  mass effect. An additional subcentimeter focus of diffusion signal abnormality noted at the right frontal centrum semi ovale superiorly (series 5, image 27), consistent with an early subacute ischemic infarct. Minimal associated petechial blood product noted at this location as  well without mass effect. Gray-white matter differentiation otherwise maintained. No other acute or chronic intracranial blood products. No mass lesion or midline shift. No hydrocephalus or extra-axial fluid collection. Pituitary gland suprasellar region within normal limits. No abnormal enhancement. Vascular: Major intracranial vascular flow voids are maintained. Skull and upper cervical spine: Craniocervical junctional limits. Bone marrow signal intensity normal. Localized thickening of the left parietal calvarium without discrete lesion noted. No scalp soft tissue abnormality. Sinuses/Orbits: Globes orbital soft tissues within normal limits. Paranasal sinuses are largely clear. No significant mastoid effusion. Other: None. MRA HEAD FINDINGS Anterior circulation: Posterior circulation: Both V4 segments patent without stenosis. Both PICA patent. Basilar patent without stenosis. Superior cerebral arteries patent bilaterally. Both PCAs primarily supplied via the basilar. PCAs are patent to their distal aspects without significant stenosis. Anatomic variants: None significant.  No aneurysm. IMPRESSION: MRI HEAD: 1. Evolving subacute ischemic infarct involving the right occipital lobe. Associated petechial blood products without frank hemorrhagic transformation or significant regional mass effect. 2. Additional subcentimeter early subacute ischemic infarct involving the right frontal centrum semi ovale. 3. Underlying mild chronic microvascular ischemic disease with remote left basal ganglia lacunar infarct. MRA HEAD: 1. Negative intracranial MRA for large vessel occlusion. 2. Focal severe proximal right M2 stenosis, inferior division. 3. No other hemodynamically significant or correctable stenosis about the intracranial arterial circulation. Electronically Signed   By: Rise Mu M.D.   On: 07/31/2023 22:57   CT Head Wo Contrast Result Date: 07/31/2023 CLINICAL DATA:  Memory loss, body tremors, tingling  and burning sensation in legs and arms. EXAM: CT HEAD WITHOUT CONTRAST TECHNIQUE: Contiguous axial images were obtained from the base of the skull through the vertex without intravenous contrast. RADIATION DOSE REDUCTION: This exam was performed according to the departmental dose-optimization program which includes automated exposure control, adjustment of the mA and/or kV according to patient size and/or use of iterative reconstruction technique. COMPARISON:  CT 02/22/2017 FINDINGS: Brain: Area of hypoattenuation within the right occipital lobe (series 2/image 17) concerning for acute or subacute infarct. No intracranial hemorrhage. No hydrocephalus. No mass effect. Basal cisterns are patent. Vascular: No hyperdense vessel or unexpected calcification. Skull: Normal. Negative for fracture or focal lesion. Sinuses/Orbits: No acute finding. Other: None. IMPRESSION: Area of hypoattenuation within the right occipital lobe concerning for acute or subacute infarct. MRI is recommended for further evaluation. Critical Value/emergent results were called by telephone at the time of interpretation on 07/31/2023 at 8:53 pm to provider ANDREW TEE , who verbally acknowledged these results. Electronically Signed   By: Minerva Fester M.D.   On: 07/31/2023 21:05      Cherrie Franca T. Ireland Chagnon [facility]  If 7PM-7AM, please contact night-coverage [email or web address] 08/01/2023, 11:49 AM

## 2023-08-01 NOTE — Plan of Care (Signed)
  Problem: Acute Rehab PT Goals(only PT should resolve) Goal: Patient Will Transfer Sit To/From Stand Outcome: Progressing Flowsheets (Taken 08/01/2023 1020) Patient will transfer sit to/from stand: Independently Goal: Pt Will Transfer Bed To Chair/Chair To Bed Outcome: Progressing Flowsheets (Taken 08/01/2023 1020) Pt will Transfer Bed to Chair/Chair to Bed: Independently Goal: Pt Will Ambulate Outcome: Progressing Flowsheets (Taken 08/01/2023 1020) Pt will Ambulate:  100 feet  Independently

## 2023-08-01 NOTE — Evaluation (Signed)
Occupational Therapy Evaluation Patient Details Name: Mathew Brady MRN: [id number] DOB: September 20, 1965 Today's Date: 08/01/2023   History of Present Illness Mathew Brady is a 58 y.o. male with medical history significant of hypertension, COPD, anxiety, chronic back pain who presents to the emergency department due to dizziness and lightheadedness which has been ongoing for about 2 weeks.  He complains of having fullness and some mild pain in his right ear, this popped while in the waiting room today with some alleviation.  He also had some tingling sensation in his extremities today which resulted in him deciding to go to the ED for further evaluation.     ED Course:   In the emergency department, BP was 160/93 and other vital signs were within normal range.  Workup in the ED showed normal CBC and BMP except for chloride of 95.  CT head without contrast showed area of hypoattenuation within the right occipital lobe concerning for acute or subacute infarct  MRI head without and with contrast showed evolving subacute ischemic infarct involving the right occipital lobe.  Associated petechial blood products without frank hemorrhagic transformation or significant regional mass effect.  Additional subcentimeter early subacute ischemic infarct involving the right frontal centrum semi ovale.  Underlying mild chronic microvascular ischemic disease with remote left basal ganglia lacunar infarct.  MRA head was negative for large vessel occlusion.  Augmentin was given, meclizine was given.  Hospitalist was asked to admit patient for further evaluation and management.   Clinical Impression   Pt in bed upon arrival. Pt agreeable to OT/PT evaluation. Pt reports prior to hospitalization he completed all ADLs independently, worked and drove. Pt stated at baseline he has some RUE shoulder pain. When testing sensation, pt reports that RUE feels "slightly less" prominent that LUE. Pt donned socks independently while seated EOB. SBA  for safety when completing sit to stand t/f. Pt requiring CGA for functional mobility for safety. Pt reports his wife lives at home with him and is available 24/7 for assist. Pt does not require further OT services.       If plan is discharge home, recommend the following: A little help with walking and/or transfers;A little help with bathing/dressing/bathroom    Functional Status Assessment  Patient has had a recent decline in their functional status and demonstrates the ability to make significant improvements in function in a reasonable and predictable amount of time.  Equipment Recommendations  None recommended by OT       Precautions / Restrictions Precautions Precautions: Fall Restrictions Weight Bearing Restrictions Per Provider Order: No      Mobility Bed Mobility Overal bed mobility: Independent             General bed mobility comments: independently going from supine to seated EOB with ease    Transfers Overall transfer level: Modified independent Equipment used: None               General transfer comment: SBA for safety. Easily stood up from bedside      Balance Overall balance assessment: Modified Independent (Pt sitting balance is good. SBA for balance during functional activity due to slight LOB)                                         ADL either performed or assessed with clinical judgement   ADL Overall ADL's : Independent  General ADL Comments: Pt independently donning socks at EOB. Anticipate ADLs needing to be completed while seated due to some slight LOB     Vision Baseline Vision/History: 1 Wears glasses Ability to See in Adequate Light: 0 Adequate Patient Visual Report: No change from baseline              Pertinent Vitals/Pain Pain Assessment Pain Assessment: 0-10 Pain Score: 2  Pain Location: back Pain Descriptors / Indicators: Aching, Grimacing Pain  Intervention(s): Limited activity within patient's tolerance     Extremity/Trunk Assessment Upper Extremity Assessment Upper Extremity Assessment: Overall WFL for tasks assessed;RUE deficits/detail RUE: Shoulder pain with ROM (prior shoulder injury) RUE Sensation: decreased light touch (Pt reports that sensation is slightly "less" on R side verses left side) RUE Coordination: WNL   Lower Extremity Assessment Lower Extremity Assessment: Defer to PT evaluation   Cervical / Trunk Assessment Cervical / Trunk Assessment: Normal   Communication Communication Communication: No apparent difficulties   Cognition Arousal: Alert Behavior During Therapy: WFL for tasks assessed/performed Overall Cognitive Status: Within Functional Limits for tasks assessed                                                  Home Living Family/patient expects to be discharged to:: Private residence Living Arrangements: Spouse/significant other Available Help at Discharge: Family Type of Home: House Home Access: Level entry     Home Layout: One level     Bathroom Shower/Tub: Producer, television/film/video: Standard Bathroom Accessibility: Yes How Accessible: Accessible via walker Home Equipment: Agricultural consultant (2 wheels);Tub bench;Wheelchair - manual;Grab bars - toilet;Grab bars - tub/shower;BSC/3in1          Prior Functioning/Environment Prior Level of Function : Independent/Modified Independent               ADLs Comments: Pt was independent in ADLS IADLs, driving and working        OT Problem List: Decreased activity tolerance      OT Treatment/Interventions:      OT Goals(Current goals can be found in the care plan section) Acute Rehab OT Goals Patient Stated Goal: to return home OT Goal Formulation: With patient  OT Frequency:      Co-evaluation PT/OT/SLP Co-Evaluation/Treatment: Yes Reason for Co-Treatment: To address functional/ADL transfers   OT  goals addressed during session: ADL's and self-care      AM-PAC OT "6 Clicks" Daily Activity     Outcome Measure Help from another person eating meals?: None Help from another person taking care of personal grooming?: None Help from another person toileting, which includes using toliet, bedpan, or urinal?: None Help from another person bathing (including washing, rinsing, drying)?: A Little Help from another person to put on and taking off regular upper body clothing?: None Help from another person to put on and taking off regular lower body clothing?: None 6 Click Score: 23   End of Session Nurse Communication: Mobility status  Activity Tolerance: Patient tolerated treatment well Patient left: in bed;with call bell/phone within reach;with family/visitor present  OT Visit Diagnosis: Other symptoms and signs involving the nervous system (Z30.865)                Time: 7846-9629 OT Time Calculation (min): 13 min Charges:  OT General Charges $OT Visit: 1 Visit OT Evaluation $OT Eval Low Complexity:  1 Low   Bevelyn Ngo, OTR/L 08/01/2023, 9:34 AM

## 2023-08-01 NOTE — Evaluation (Signed)
Physical Therapy Evaluation Patient Details Name: Mathew Brady MRN: [id number] DOB: [birth year] Today's Date: 08/01/2023  History of Present Illness  Mathew Brady is a 58 y.o. male with medical history significant of hypertension, COPD, anxiety, chronic back pain who presents to the emergency department due to dizziness and lightheadedness which has been ongoing for about 2 weeks.  He complains of having fullness and some mild pain in his right ear, this popped while in the waiting room today with some alleviation.  He also had some tingling sensation in his extremities today which resulted in him deciding to go to the ED for further evaluation.     ED Course:   In the emergency department, BP was 160/93 and other vital signs were within normal range.  Workup in the ED showed normal CBC and BMP except for chloride of 95.  CT head without contrast showed area of hypoattenuation within the right occipital lobe concerning for acute or subacute infarct  MRI head without and with contrast showed evolving subacute ischemic infarct involving the right occipital lobe.  Associated petechial blood products without frank hemorrhagic transformation or significant regional mass effect.  Additional subcentimeter early subacute ischemic infarct involving the right frontal centrum semi ovale.  Underlying mild chronic microvascular ischemic disease with remote left basal ganglia lacunar infarct.  MRA head was negative for large vessel occlusion.  Augmentin was given, meclizine was given.  Hospitalist was asked to admit patient for further evaluation and management.    Clinical Impression  Patient lying in bed on his left side on therapist arrival.  Wife is present at bedside.  Patient agreeable to therapy session.  Patient able to sit up on the edge of the bed easily and independently.  Demonstrates good sitting balance on the edge of the bed.  Sit to stand without AD.  Patient ambulates out in the hallway with CGA for safety;  he occasionally drifts with walking but has not loss of balance.  He states he is "still trying to wake up"; and does not seem to be aware of any path deviation.  Patient returns to his room to sit in the chair and then gets back into the bed with SBA/Supervision for safety.  Patient will benefit from continued skilled therapy services during the remainder of his hospital stay and at the next recommended venue of care to address deficits and promote return to optimal function.            If plan is discharge home, recommend the following: A little help with walking and/or transfers   Can travel by private vehicle        Equipment Recommendations None recommended by PT  Recommendations for Other Services       Functional Status Assessment Patient has had a recent decline in their functional status and demonstrates the ability to make significant improvements in function in a reasonable and predictable amount of time.     Precautions / Restrictions Precautions Precautions: Fall Restrictions Weight Bearing Restrictions Per Provider Order: No      Mobility  Bed Mobility Overal bed mobility: Independent             General bed mobility comments: independently going from supine to seated EOB with ease Patient Response: Cooperative  Transfers Overall transfer level: Modified independent Equipment used: None               General transfer comment: SBA for safety. Easily stood up from bedside    Ambulation/Gait  Ambulation/Gait assistance: Contact guard assist Gait Distance (Feet): 50 Feet Assistive device: None Gait Pattern/deviations: Drifts right/left       General Gait Details: occasional misstep but no loss of balance; needed CGA for safety; seems to be unaware of balance issues but states he is "still waking up"  Stairs            Wheelchair Mobility     Tilt Bed Tilt Bed Patient Response: Cooperative  Modified Rankin (Stroke Patients Only)        Balance Overall balance assessment: Needs assistance Sitting-balance support: No upper extremity supported, Feet supported Sitting balance-Leahy Scale: Good Sitting balance - Comments: good sitting balance on edge of bed   Standing balance support: During functional activity Standing balance-Leahy Scale: Fair Standing balance comment: fair to good standing balance with walking occasionally demonstrates path devation but no loss of balance                             Pertinent Vitals/Pain Pain Assessment Pain Assessment: 0-10 Pain Score: 2  Pain Location: back Pain Descriptors / Indicators: Aching, Grimacing Pain Intervention(s): Limited activity within patient's tolerance    Home Living Family/patient expects to be discharged to:: Private residence Living Arrangements: Spouse/significant other Available Help at Discharge: Family Type of Home: House Home Access: Level entry       Home Layout: One level Home Equipment: Agricultural consultant (2 wheels);Tub bench;Wheelchair - manual;Grab bars - toilet;Grab bars - tub/shower;BSC/3in1      Prior Function Prior Level of Function : Independent/Modified Independent               ADLs Comments: Pt was independent in ADLS IADLs, driving and working     Extremity/Trunk Assessment   Upper Extremity Assessment Upper Extremity Assessment: Defer to OT evaluation RUE: Shoulder pain with ROM (prior shoulder injury) RUE Sensation: decreased light touch (Pt reports that sensation is slightly "less" on R side verses left side) RUE Coordination: WNL    Lower Extremity Assessment Lower Extremity Assessment: Generalized weakness    Cervical / Trunk Assessment Cervical / Trunk Assessment: Normal  Communication   Communication Communication: No apparent difficulties  Cognition                                                General Comments      Exercises     Assessment/Plan    PT  Assessment Patient needs continued PT services  PT Problem List Decreased balance;Decreased mobility;Decreased safety awareness       PT Treatment Interventions Gait training;Functional mobility training;Therapeutic activities;Therapeutic exercise;Balance training    PT Goals (Current goals can be found in the Care Plan section)  Acute Rehab PT Goals Patient Stated Goal: return home PT Goal Formulation: With patient/family Time For Goal Achievement: 08/15/23 Potential to Achieve Goals: Good    Frequency Min 2X/week     Co-evaluation PT/OT/SLP Co-Evaluation/Treatment: Yes Reason for Co-Treatment: To address functional/ADL transfers PT goals addressed during session: Mobility/safety with mobility;Balance OT goals addressed during session: ADL's and self-care       AM-PAC PT "6 Clicks" Mobility  Outcome Measure Help needed turning from your back to your side while in a flat bed without using bedrails?: None Help needed moving from lying on your back to sitting on the side of a  flat bed without using bedrails?: None Help needed moving to and from a bed to a chair (including a wheelchair)?: A Little Help needed standing up from a chair using your arms (e.g., wheelchair or bedside chair)?: A Little Help needed to walk in hospital room?: A Little Help needed climbing 3-5 steps with a railing? : A Little 6 Click Score: 20    End of Session   Activity Tolerance: Patient tolerated treatment well Patient left: with bed alarm set;with family/visitor present;with call bell/phone within reach;in bed Nurse Communication: Mobility status PT Visit Diagnosis: Unsteadiness on feet (R26.81);Other abnormalities of gait and mobility (R26.89)    Time: 2952-8413 PT Time Calculation (min) (ACUTE ONLY): 18 min   Charges:   PT Evaluation $PT Eval Low Complexity: 1 Low   PT General Charges $$ ACUTE PT VISIT: 1 Visit         10:19 AM, 08/01/23 Mathew Brady Small Chrishaun Sasso MPT [facility] [place] [phone] Ph:[phone]

## 2023-08-05 LAB — CYTOLOGY - NON PAP

## 2023-08-05 LAB — CSF CULTURE W GRAM STAIN
Gram Stain: NONE SEEN
Special Requests: NORMAL

## 2023-08-08 ENCOUNTER — Encounter (HOSPITAL_COMMUNITY): Payer: Self-pay

## 2023-08-08 ENCOUNTER — Ambulatory Visit (HOSPITAL_COMMUNITY)
Admission: RE | Admit: 2023-08-08 | Discharge: 2023-08-08 | Disposition: A | Discharge status: Home / Self Care | Payer: Medicaid Other | Source: Ambulatory Visit | Attending: Otolaryngology | Admitting: Otolaryngology

## 2023-08-08 DIAGNOSIS — M25511 Pain in right shoulder: Secondary | ICD-10-CM | POA: Insufficient documentation

## 2023-08-13 LAB — OLIGOCLONAL BANDS, CSF + SERM

## 2023-08-18 LAB — VDRL, CSF: VDRL Quant, CSF: NONREACTIVE

## 2023-09-02 ENCOUNTER — Telehealth: Payer: Self-pay | Admitting: *Deleted

## 2023-09-02 NOTE — Telephone Encounter (Signed)
 Received a pre op surgical clearance form to be completed fro [facility] R shoulder scope SAD Open RCR and biceps tenndesis DCR TBD .  This was completed and signed by Dr. Teresa Coombs (he recommended that surgery be postponed until 6 months following stroke in January .  He is on DAPT for a tolal of 3 months.  Signed and faxed back to [facility] [phone] fax confirmation received. To MR. Also faxed to [facility] [phone]. Received fax confirmation.

## 2023-09-26 ENCOUNTER — Encounter: Payer: Self-pay | Admitting: Neurology

## 2023-09-26 ENCOUNTER — Ambulatory Visit: Payer: BLUE CROSS/BLUE SHIELD | Admitting: Neurology

## 2023-09-26 VITALS — BP 147/82 | HR 83 | Ht 69.0 in | Wt 202.0 lb

## 2023-09-26 DIAGNOSIS — I63511 Cerebral infarction due to unspecified occlusion or stenosis of right middle cerebral artery: Secondary | ICD-10-CM | POA: Diagnosis not present

## 2023-09-26 DIAGNOSIS — M25511 Pain in right shoulder: Secondary | ICD-10-CM

## 2023-09-26 DIAGNOSIS — G8929 Other chronic pain: Secondary | ICD-10-CM | POA: Diagnosis not present

## 2023-09-26 MED ORDER — ROSUVASTATIN CALCIUM 20 MG PO TABS: 20.0000 mg | ORAL_TABLET | Freq: Every day | ORAL | 3 refills | Status: AC
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
## 2023-09-26 NOTE — Patient Instructions (Addendum)
 Continue with aspirin and Plavix for total of 90 days Start Crestor 20 mg nightly Continue your other medications In term of surgery post stroke, we do recommend waiting at least 6 months after a stroke before surgery. Patient tells me that he cannot wait 38-months due to severe pain.  I have informed him there is an increased risk of having a recurrent stroke while being off Plavix and Aspirin, increase risk of infection, bleeding and death and as long as he understands the risks including large territory stroke since he has a severe proximal M2 stenosis, increase disability and even death, he might proceed with surgery. He will most likely have to sign a waiver.   Continue to follow-up with PCP and orthopedic surgeon and return as needed.

## 2023-10-30 NOTE — Progress Notes (Signed)
 Surgery orders requested via Epic inbox.

## 2023-11-03 NOTE — Progress Notes (Signed)
 Request sent to Dr. Brunilda Capra to send pre op orders for PST appointment  May 1.

## 2023-11-03 NOTE — Progress Notes (Addendum)
 COVID Vaccine received:  [x]  No []  Yes Date of any COVID positive Test in last 90 days: no PCP - Denman Fischer MD Cardiologist - n/a  Chest x-ray -  EKG -  07/31/23 Epic Stress Test - 12/05/16 Epic ECHO - 08/01/23 Epic Cardiac Cath - 12/09/16 Epic  Bowel Prep - [x]  No  []   Yes ______  Pacemaker / ICD device [x]  No []  Yes   Spinal Cord Stimulator:[x]  No []  Yes       History of Sleep Apnea? [x]  No []  Yes   CPAP used?- [x]  No []  Yes    Does the patient monitor blood sugar?          [x]  No []  Yes  []  N/A  Patient has: [x]  NO Hx DM   []  Pre-DM                 []  DM1  []   DM2 Does patient have a [employer] or [employer]acom? []  No []  Yes   Fasting Blood Sugar Ranges-  Checks Blood Sugar _____ times a day  GLP1 agonist / usual dose - no GLP1 instructions:  SGLT-2 inhibitors / usual dose - no SGLT-2 instructions:   Blood Thinner / Instructions:no Aspirin  Instructions:ASA 81mg  Pt. Stopped it April 21st.  Comments:   Activity level: Patient is able  to climb a flight of stairs without difficulty; [x]  No CP  [x]  No SOB,  Patient can  perform ADLs without assistance.   Anesthesia review: HTN, L lobectomy, Stroke 07/2023, COPD, CAD  Patient denies shortness of breath, fever, cough and chest pain at PAT appointment.  Patient verbalized understanding and agreement to the Pre-Surgical Instructions that were given to them at this PAT appointment. Patient was also educated of the need to review these PAT instructions again prior to his/her surgery.I reviewed the appropriate phone numbers to call if they have any and questions or concerns.

## 2023-11-04 NOTE — Patient Instructions (Addendum)
 SURGICAL WAITING ROOM VISITATION  Patients having surgery or a procedure may have no more than 2 support people in the waiting area - these visitors may rotate.    Children under the age of 24 must have an adult with them who is not the patient.  Due to an increase in RSV and influenza rates and associated hospitalizations, children ages 19 and under may not visit patients in [facility] hospitals.  Visitors with respiratory illnesses are discouraged from visiting and should remain at home.  If the patient needs to stay at the hospital during part of their recovery, the visitor guidelines for inpatient rooms apply. Pre-op  nurse will coordinate an appropriate time for 1 support person to accompany patient in pre-op .  This support person may not rotate.    Please refer to the [facility] website for the visitor guidelines for Inpatients (after your surgery is over and you are in a regular room).       Your procedure is scheduled on: 11/19/23   Report to [facility] Main Entrance    Report to admitting at 12:45 PM   Call this number if you have problems the morning of surgery [phone]   Do not eat food :After Midnight.   After Midnight you may have the following liquids until 11:55 AM DAY OF SURGERY  Water Non-Citrus Juices (without pulp, NO RED-Apple, White grape, White cranberry) Black Coffee (NO MILK/CREAM OR CREAMERS, sugar ok)  Clear Tea (NO MILK/CREAM OR CREAMERS, sugar ok) regular and decaf                             Plain Jell-O (NO RED)                                           Fruit ices (not with fruit pulp, NO RED)                                     Popsicles (NO RED)                                                               Sports drinks like Gatorade (NO RED)                The day of surgery:  Drink ONE (1) Pre-Surgery Clear Ensure 11:55  AM the morning of surgery. Drink in one sitting. Do not sip.  This drink was given to you during your hospital   pre-op  appointment visit. Nothing else to drink after completing the  Pre-Surgery Clear Ensure .    DENTURES WILL BE REMOVED PRIOR TO SURGERY PLEASE DO NOT APPLY "Poly grip" OR ADHESIVES!!!   Stop all vitamins and herbal supplements 7 days before surgery.   Take these medicines the morning of surgery with A SIP OF WATER: Tylenol  if needed, Xanax  if needed, Amlodipine , gabapentin , rosuvastatin .             You may not have any metal on your body including hair pins, jewelry, and body piercing  Do not wear make-up, lotions, powders, perfumes/cologne, or deodorant              Men may shave face and neck.   Do not bring valuables to the hospital. [facility] IS NOT             RESPONSIBLE   FOR VALUABLES.   Contacts, glasses, dentures or bridgework may not be worn into surgery.  DO NOT BRING YOUR HOME MEDICATIONS TO THE HOSPITAL. PHARMACY WILL DISPENSE MEDICATIONS LISTED ON YOUR MEDICATION LIST TO YOU DURING YOUR ADMISSION IN THE HOSPITAL!    Patients discharged on the day of surgery will not be allowed to drive home.  Someone NEEDS to stay with you for the first 24 hours after anesthesia.   Special Instructions: Bring a copy of your healthcare power of attorney and living will documents the day of surgery if you haven't scanned them before.              Please read over the following fact sheets you were given: IF YOU HAVE QUESTIONS ABOUT YOUR PRE-OP  INSTRUCTIONS PLEASE CALL [phone] Ammon Bales   If you received a COVID test during your pre-op  visit  it is requested that you wear a mask when out in public, stay away from anyone that may not be feeling well and notify your surgeon if you develop symptoms. If you test positive for Covid or have been in contact with anyone that has tested positive in the last 10 days please notify you surgeon.     - Preparing for Surgery Before surgery, you can play an important role.  Because skin is not sterile, your skin needs  to be as free of germs as possible.  You can reduce the number of germs on your skin by washing with CHG (chlorahexidine gluconate) soap before surgery.  CHG is an antiseptic cleaner which kills germs and bonds with the skin to continue killing germs even after washing. Please DO NOT use if you have an allergy to CHG or antibacterial soaps.  If your skin becomes reddened/irritated stop using the CHG and inform your nurse when you arrive at Short Stay. Do not shave (including legs and underarms) for at least 48 hours prior to the first CHG shower.  You may shave your face/neck.  Please follow these instructions carefully:  1.  Shower with CHG Soap the night before surgery and the  morning of surgery.  2.  If you choose to wash your hair, wash your hair first as usual with your normal  shampoo.  3.  After you shampoo, rinse your hair and body thoroughly to remove the shampoo.                             4.  Use CHG as you would any other liquid soap.  You can apply chg directly to the skin and wash.  Gently with a scrungie or clean washcloth.  5.  Apply the CHG Soap to your body ONLY FROM THE NECK DOWN.   Do   not use on face/ open                           Wound or open sores. Avoid contact with eyes, ears mouth and   genitals (private parts).  Wash face,  Genitals (private parts) with your normal soap.             6.  Wash thoroughly, paying special attention to the area where your    surgery  will be performed.  7.  Thoroughly rinse your body with warm water from the neck down.  8.  DO NOT shower/wash with your normal soap after using and rinsing off the CHG Soap.                9.  Pat yourself dry with a clean towel.            10.  Wear clean pajamas.            11.  Place clean sheets on your bed the night of your first shower and do not  sleep with pets. Day of Surgery : Do not apply any lotions/deodorants the morning of surgery.  Please wear clean clothes to the  hospital/surgery center.  FAILURE TO FOLLOW THESE INSTRUCTIONS MAY RESULT IN THE CANCELLATION OF YOUR SURGERY  PATIENT SIGNATURE_________________________________  NURSE SIGNATURE__________________________________  ________________________________________________________________________

## 2023-11-04 NOTE — H&P (Signed)
 Patient's anticipated LOS is less than 2 midnights, meeting these requirements: - Younger than 58 - Lives within 1 hour of care - Has a competent adult at home to recover with post-op recover - NO history of  - Chronic pain requiring opiods  - Diabetes  - Coronary Artery Disease  - Heart failure  - Heart attack  - Stroke  - DVT/VTE  - Cardiac arrhythmia  - Respiratory Failure/COPD  - Renal failure  - Anemia  - Advanced Liver disease     Mathew Brady is an 58 y.o. male.    Chief Complaint: right shoulder pain  HPI: Pt is a 58 y.o. male complaining of right shoulder pain for multiple months. Pain had continually increased since the beginning. X-rays in the clinic show rotator cuff tear right shoulder. Pt has tried various conservative treatments which have failed to alleviate their symptoms, including injections and therapy. Various options are discussed with the patient. Risks, benefits and expectations were discussed with the patient. Patient understand the risks, benefits and expectations and wishes to proceed with surgery.   PCP:  Omie Bickers, MD  D/C Plans: Home  PMH: Past Medical History:  Diagnosis Date   Anemia 06/18/2012   Anxiety    Arthritis    CAD (coronary artery disease)    Mild to moderate nonobstructive CAD at cardiac catheterization 2018   COPD (chronic obstructive pulmonary disease) (HCC)    Essential hypertension    Fibromyalgia    GERD (gastroesophageal reflux disease)    History of kidney stones    Opiate use    Palpitations    Peripheral neuropathy    Pulmonary nodule    Vitamin B 12 deficiency     PSH: Past Surgical History:  Procedure Laterality Date   COLONOSCOPY     found to have 1 polyp   ESOPHAGEAL DILATION  1993   LEFT HEART CATH AND CORONARY ANGIOGRAPHY N/A 12/09/2016   Procedure: Left Heart Cath and Coronary Angiography;  Surgeon: Sammy Crisp, MD;  Location: [facility];  Service: Cardiovascular;  Laterality: N/A;    LUNG SURGERY Left    2018 upper lobe   TONSILLECTOMY     VIDEO ASSISTED THORACOSCOPY (VATS)/WEDGE RESECTION Left 09/08/2017   Procedure: VIDEO ASSISTED THORACOSCOPY (VATS)/WEDGE RESECTION;  Surgeon: Zelphia Higashi, MD;  Location: [facility] OR;  Service: Thoracic;  Laterality: Left;    Social History:  reports that he quit smoking about 8 years ago. His smoking use included cigarettes. He started smoking about 44 years ago. He has a 36 pack-year smoking history. He has never used smokeless tobacco. He reports that he does not drink alcohol and does not use drugs. BMI: Estimated body mass index is 29.83 kg/m as calculated from the following:   Height as of 09/26/23: 5\' 9"  (1.753 m).   Weight as of 09/26/23: 91.6 kg.  Lab Results  Component Value Date   ALBUMIN 3.8 08/01/2023   Diabetes: Patient does not have a diagnosis of diabetes. Lab Results  Component Value Date   HGBA1C 5.5 07/31/2023     Smoking Status:      Allergies:  Allergies  Allergen Reactions   Succinylcholine Anaphylaxis   Pregabalin  Other (See Comments) and Photosensitivity    Medications: No current facility-administered medications for this encounter.   Current Outpatient Medications  Medication Sig Dispense Refill   albuterol  (PROVENTIL  HFA;VENTOLIN  HFA) 108 (90 Base) MCG/ACT inhaler Inhale 2 puffs into the lungs every 4 (four) hours as needed for  wheezing or shortness of breath. 1 Inhaler 6   amLODipine  (NORVASC ) 10 MG tablet Take 10 mg by mouth daily in the afternoon.     gabapentin  (NEURONTIN ) 800 MG tablet Take 1 tablet (800 mg total) by mouth 3 (three) times daily. (Patient taking differently: Take 800 mg by mouth in the morning, at noon, in the evening, and at bedtime.) 90 tablet 0   oxyCODONE -acetaminophen  (PERCOCET) 10-325 MG tablet Take 1 tablet by mouth in the morning, at noon, in the evening, and at bedtime.     rosuvastatin  (CRESTOR ) 20 MG tablet Take 1 tablet (20 mg total) by mouth daily.  (Patient taking differently: Take 20 mg by mouth every evening.) 90 tablet 3    No results found for this or any previous visit (from the past 48 hours). No results found.  ROS: Pain with rom of the right upper extremity  Physical Exam: Alert and oriented 58 y.o. male in no acute distress Cranial nerves 2-12 intact Cervical spine: full rom with no tenderness, nv intact distally Chest: active breath sounds bilaterally, no wheeze rhonchi or rales Heart: regular rate and rhythm, no murmur Abd: non tender non distended with active bowel sounds Hip is stable with rom  Right shoulder painful rom with weakness Nv intact distally No rashes or edema distally  Assessment/Plan Assessment: right shoulder rotator cuff tear  Plan:  Patient will undergo a right shoulder rotator cuff repair by Dr. Brunilda Capra at [facility] Risks benefits and expectations were discussed with the patient. Patient understand risks, benefits and expectations and wishes to proceed. Preoperative templating of the joint replacement has been completed, documented, and submitted to the Operating Room personnel in order to optimize intra-operative equipment management.   Lorina Roosevelt PA-C, MPAS [facility] is now [employer] [address], [address], [place], [place] [address] Phone: [phone] [email or web address] Facebook  [employer]

## 2023-11-06 ENCOUNTER — Other Ambulatory Visit: Payer: Self-pay

## 2023-11-06 ENCOUNTER — Encounter (HOSPITAL_COMMUNITY): Payer: Self-pay

## 2023-11-06 ENCOUNTER — Encounter (HOSPITAL_COMMUNITY)
Admission: RE | Admit: 2023-11-06 | Discharge: 2023-11-06 | Disposition: A | Discharge status: Home / Self Care | Source: Ambulatory Visit | Attending: Orthopedic Surgery | Admitting: Orthopedic Surgery

## 2023-11-06 VITALS — BP 137/99 | HR 58 | Temp 98.5°F | Resp 16 | Ht 69.0 in | Wt 195.0 lb

## 2023-11-06 DIAGNOSIS — Z01818 Encounter for other preprocedural examination: Secondary | ICD-10-CM | POA: Insufficient documentation

## 2023-11-06 DIAGNOSIS — I10 Essential (primary) hypertension: Secondary | ICD-10-CM | POA: Diagnosis not present

## 2023-11-06 HISTORY — DX: Cerebral infarction, unspecified: I63.9

## 2023-11-06 HISTORY — DX: Other complications of anesthesia, initial encounter: T88.59XA

## 2023-11-06 LAB — CBC
HCT: 48.7 % (ref 39.0–52.0)
Hemoglobin: 15.6 g/dL (ref 13.0–17.0)
MCH: 31.5 pg (ref 26.0–34.0)
MCHC: 32 g/dL (ref 30.0–36.0)
MCV: 98.2 fL (ref 80.0–100.0)
Platelets: 330 10*3/uL (ref 150–400)
RBC: 4.96 MIL/uL (ref 4.22–5.81)
RDW: 12.4 % (ref 11.5–15.5)
WBC: 10.2 10*3/uL (ref 4.0–10.5)
nRBC: 0 % (ref 0.0–0.2)

## 2023-11-06 LAB — BASIC METABOLIC PANEL WITH GFR
Anion gap: 6 (ref 5–15)
BUN: 16 mg/dL (ref 6–20)
CO2: 28 mmol/L (ref 22–32)
Calcium: 9.1 mg/dL (ref 8.9–10.3)
Chloride: 103 mmol/L (ref 98–111)
Creatinine, Ser: 1.06 mg/dL (ref 0.61–1.24)
GFR, Estimated: >60 mL/min (ref >60)
Glucose, Bld: 90 mg/dL (ref 70–99)
Potassium: 4.3 mmol/L (ref 3.5–5.1)
Sodium: 137 mmol/L (ref 135–145)

## 2023-11-07 ENCOUNTER — Encounter (HOSPITAL_COMMUNITY): Payer: Self-pay | Admitting: Physician Assistant

## 2023-11-09 ENCOUNTER — Inpatient Hospital Stay (HOSPITAL_COMMUNITY)

## 2023-11-09 ENCOUNTER — Emergency Department (HOSPITAL_COMMUNITY): Admitting: Anesthesiology

## 2023-11-09 ENCOUNTER — Encounter (HOSPITAL_COMMUNITY)
Admission: EM | Disposition: A | Discharge status: Home / Self Care | Payer: Self-pay | Source: Home / Self Care | Attending: Neurology

## 2023-11-09 ENCOUNTER — Encounter (HOSPITAL_COMMUNITY): Payer: Self-pay

## 2023-11-09 ENCOUNTER — Inpatient Hospital Stay (HOSPITAL_COMMUNITY)
Admission: EM | Admit: 2023-11-09 | Discharge: 2023-11-10 | DRG: 024 | Disposition: A | Discharge status: Home / Self Care | Attending: Neurology | Admitting: Neurology

## 2023-11-09 ENCOUNTER — Emergency Department (HOSPITAL_COMMUNITY)

## 2023-11-09 ENCOUNTER — Other Ambulatory Visit: Payer: Self-pay

## 2023-11-09 DIAGNOSIS — M797 Fibromyalgia: Secondary | ICD-10-CM | POA: Diagnosis present

## 2023-11-09 DIAGNOSIS — I6601 Occlusion and stenosis of right middle cerebral artery: Secondary | ICD-10-CM | POA: Diagnosis not present

## 2023-11-09 DIAGNOSIS — E785 Hyperlipidemia, unspecified: Secondary | ICD-10-CM | POA: Diagnosis present

## 2023-11-09 DIAGNOSIS — R471 Dysarthria and anarthria: Secondary | ICD-10-CM | POA: Diagnosis present

## 2023-11-09 DIAGNOSIS — E876 Hypokalemia: Secondary | ICD-10-CM | POA: Diagnosis present

## 2023-11-09 DIAGNOSIS — Z825 Family history of asthma and other chronic lower respiratory diseases: Secondary | ICD-10-CM

## 2023-11-09 DIAGNOSIS — Z800 Family history of malignant neoplasm of digestive organs: Secondary | ICD-10-CM

## 2023-11-09 DIAGNOSIS — I10 Essential (primary) hypertension: Secondary | ICD-10-CM

## 2023-11-09 DIAGNOSIS — F419 Anxiety disorder, unspecified: Secondary | ICD-10-CM | POA: Diagnosis present

## 2023-11-09 DIAGNOSIS — Z77090 Contact with and (suspected) exposure to asbestos: Secondary | ICD-10-CM | POA: Diagnosis present

## 2023-11-09 DIAGNOSIS — I2510 Atherosclerotic heart disease of native coronary artery without angina pectoris: Secondary | ICD-10-CM | POA: Diagnosis present

## 2023-11-09 DIAGNOSIS — I69354 Hemiplegia and hemiparesis following cerebral infarction affecting left non-dominant side: Secondary | ICD-10-CM | POA: Diagnosis not present

## 2023-11-09 DIAGNOSIS — G8194 Hemiplegia, unspecified affecting left nondominant side: Secondary | ICD-10-CM | POA: Diagnosis present

## 2023-11-09 DIAGNOSIS — Z801 Family history of malignant neoplasm of trachea, bronchus and lung: Secondary | ICD-10-CM | POA: Diagnosis not present

## 2023-11-09 DIAGNOSIS — G4733 Obstructive sleep apnea (adult) (pediatric): Secondary | ICD-10-CM | POA: Diagnosis present

## 2023-11-09 DIAGNOSIS — I63511 Cerebral infarction due to unspecified occlusion or stenosis of right middle cerebral artery: Principal | ICD-10-CM | POA: Diagnosis present

## 2023-11-09 DIAGNOSIS — M25511 Pain in right shoulder: Secondary | ICD-10-CM | POA: Diagnosis present

## 2023-11-09 DIAGNOSIS — H5340 Unspecified visual field defects: Secondary | ICD-10-CM | POA: Diagnosis present

## 2023-11-09 DIAGNOSIS — G8929 Other chronic pain: Secondary | ICD-10-CM | POA: Diagnosis present

## 2023-11-09 DIAGNOSIS — Z79899 Other long term (current) drug therapy: Secondary | ICD-10-CM

## 2023-11-09 DIAGNOSIS — R29708 NIHSS score 8: Secondary | ICD-10-CM | POA: Diagnosis present

## 2023-11-09 DIAGNOSIS — J449 Chronic obstructive pulmonary disease, unspecified: Secondary | ICD-10-CM | POA: Diagnosis present

## 2023-11-09 DIAGNOSIS — D649 Anemia, unspecified: Secondary | ICD-10-CM | POA: Diagnosis present

## 2023-11-09 DIAGNOSIS — Z8052 Family history of malignant neoplasm of bladder: Secondary | ICD-10-CM

## 2023-11-09 DIAGNOSIS — Z87891 Personal history of nicotine dependence: Secondary | ICD-10-CM

## 2023-11-09 DIAGNOSIS — R29810 Facial weakness: Secondary | ICD-10-CM | POA: Diagnosis present

## 2023-11-09 DIAGNOSIS — G629 Polyneuropathy, unspecified: Secondary | ICD-10-CM | POA: Diagnosis present

## 2023-11-09 DIAGNOSIS — R001 Bradycardia, unspecified: Secondary | ICD-10-CM | POA: Diagnosis present

## 2023-11-09 DIAGNOSIS — I639 Cerebral infarction, unspecified: Principal | ICD-10-CM | POA: Diagnosis present

## 2023-11-09 DIAGNOSIS — K219 Gastro-esophageal reflux disease without esophagitis: Secondary | ICD-10-CM | POA: Diagnosis present

## 2023-11-09 DIAGNOSIS — Z888 Allergy status to other drugs, medicaments and biological substances status: Secondary | ICD-10-CM

## 2023-11-09 DIAGNOSIS — I672 Cerebral atherosclerosis: Secondary | ICD-10-CM | POA: Diagnosis present

## 2023-11-09 DIAGNOSIS — Z8673 Personal history of transient ischemic attack (TIA), and cerebral infarction without residual deficits: Secondary | ICD-10-CM

## 2023-11-09 DIAGNOSIS — I6389 Other cerebral infarction: Secondary | ICD-10-CM | POA: Diagnosis present

## 2023-11-09 DIAGNOSIS — Z8249 Family history of ischemic heart disease and other diseases of the circulatory system: Secondary | ICD-10-CM | POA: Diagnosis not present

## 2023-11-09 DIAGNOSIS — Z87442 Personal history of urinary calculi: Secondary | ICD-10-CM | POA: Diagnosis not present

## 2023-11-09 HISTORY — PX: IR CT HEAD LTD: IMG2386

## 2023-11-09 HISTORY — PX: IR PERCUTANEOUS ART THROMBECTOMY/INFUSION INTRACRANIAL INC DIAG ANGIO: IMG6087

## 2023-11-09 HISTORY — PX: RADIOLOGY WITH ANESTHESIA: SHX6223

## 2023-11-09 LAB — I-STAT CHEM 8, ED
BUN: 6 mg/dL (ref 6–20)
BUN: 7 mg/dL (ref 6–20)
Calcium, Ion: 1.16 mmol/L (ref 1.15–1.40)
Calcium, Ion: 1.17 mmol/L (ref 1.15–1.40)
Chloride: 104 mmol/L (ref 98–111)
Chloride: 105 mmol/L (ref 98–111)
Creatinine, Ser: 0.9 mg/dL (ref 0.61–1.24)
Creatinine, Ser: 1 mg/dL (ref 0.61–1.24)
Glucose, Bld: 101 mg/dL — ABNORMAL HIGH (ref 70–99)
Glucose, Bld: 101 mg/dL — ABNORMAL HIGH (ref 70–99)
HCT: 46 % (ref 39.0–52.0)
HCT: 47 % (ref 39.0–52.0)
Hemoglobin: 15.6 g/dL (ref 13.0–17.0)
Hemoglobin: 16 g/dL (ref 13.0–17.0)
Potassium: 3.8 mmol/L (ref 3.5–5.1)
Potassium: 3.9 mmol/L (ref 3.5–5.1)
Sodium: 141 mmol/L (ref 135–145)
Sodium: 141 mmol/L (ref 135–145)
TCO2: 26 mmol/L (ref 22–32)
TCO2: 28 mmol/L (ref 22–32)

## 2023-11-09 LAB — MRSA NEXT GEN BY PCR, NASAL: MRSA by PCR Next Gen: DETECTED — AB

## 2023-11-09 LAB — SARS CORONAVIRUS 2 BY RT PCR: SARS Coronavirus 2 by RT PCR: NEGATIVE

## 2023-11-09 LAB — CBG MONITORING, ED: Glucose-Capillary: 110 mg/dL — ABNORMAL HIGH (ref 70–99)

## 2023-11-09 SURGERY — RADIOLOGY WITH ANESTHESIA
Anesthesia: General

## 2023-11-09 MED ORDER — EPTIFIBATIDE 20 MG/10ML IV SOLN
INTRAVENOUS | Status: AC
  Filled 2023-11-09: qty 10

## 2023-11-09 MED ORDER — ONDANSETRON HCL 4 MG/2ML IJ SOLN: 4.0000 mg | Freq: Four times a day (QID) | INTRAMUSCULAR | Status: DC | PRN

## 2023-11-09 MED ORDER — FENTANYL CITRATE (PF) 100 MCG/2ML IJ SOLN
INTRAMUSCULAR | Status: AC
  Filled 2023-11-09: qty 2

## 2023-11-09 MED ORDER — LACTATED RINGERS IV SOLN: INTRAVENOUS | Status: DC | PRN

## 2023-11-09 MED ORDER — CANGRELOR BOLUS VIA INFUSION
INTRAVENOUS | Status: AC | PRN
  Administered 2023-11-09: 15 ug via INTRAVENOUS

## 2023-11-09 MED ORDER — PHENYLEPHRINE 80 MCG/ML (10ML) SYRINGE FOR IV PUSH (FOR BLOOD PRESSURE SUPPORT)
PREFILLED_SYRINGE | INTRAVENOUS | Status: DC | PRN
  Administered 2023-11-09 (×2): 80 ug via INTRAVENOUS
  Administered 2023-11-09: 160 ug via INTRAVENOUS
  Administered 2023-11-09 (×2): 80 ug via INTRAVENOUS
  Administered 2023-11-09: 160 ug via INTRAVENOUS
  Administered 2023-11-09: 80 ug via INTRAVENOUS

## 2023-11-09 MED ORDER — SUGAMMADEX SODIUM 200 MG/2ML IV SOLN
INTRAVENOUS | Status: DC | PRN
  Administered 2023-11-09: 200 mg via INTRAVENOUS

## 2023-11-09 MED ORDER — OXYCODONE-ACETAMINOPHEN 5-325 MG PO TABS
1.0000 | ORAL_TABLET | ORAL | Status: DC | PRN
  Administered 2023-11-09 – 2023-11-10 (×4): 1 via ORAL
  Filled 2023-11-09 (×4): qty 1

## 2023-11-09 MED ORDER — CLEVIDIPINE BUTYRATE 0.5 MG/ML IV EMUL
INTRAVENOUS | Status: AC
Start: 2023-11-09 — End: 2023-11-10
  Filled 2023-11-09: qty 100

## 2023-11-09 MED ORDER — CLEVIDIPINE BUTYRATE 0.5 MG/ML IV EMUL
0.0000 mg/h | INTRAVENOUS | Status: AC
  Administered 2023-11-09: 4 mg/h via INTRAVENOUS

## 2023-11-09 MED ORDER — ACETAMINOPHEN 650 MG RE SUPP: 650.0000 mg | RECTAL | Status: DC | PRN

## 2023-11-09 MED ORDER — IOHEXOL 350 MG/ML SOLN
100.0000 mL | Freq: Once | INTRAVENOUS | Status: AC | PRN
  Administered 2023-11-09: 100 mL via INTRAVENOUS

## 2023-11-09 MED ORDER — PROPOFOL 10 MG/ML IV BOLUS
INTRAVENOUS | Status: DC | PRN
  Administered 2023-11-09 (×2): 50 mg via INTRAVENOUS
  Administered 2023-11-09: 150 mg via INTRAVENOUS

## 2023-11-09 MED ORDER — NITROGLYCERIN 1 MG/10 ML FOR IR/CATH LAB
INTRA_ARTERIAL | Status: AC
  Filled 2023-11-09: qty 10

## 2023-11-09 MED ORDER — TICAGRELOR 90 MG PO TABS: 90.0000 mg | ORAL_TABLET | Freq: Two times a day (BID) | ORAL | Status: DC

## 2023-11-09 MED ORDER — ASPIRIN 325 MG PO TABS
ORAL_TABLET | ORAL | Status: AC
  Filled 2023-11-09: qty 1

## 2023-11-09 MED ORDER — ASPIRIN 81 MG PO CHEW
81.0000 mg | CHEWABLE_TABLET | Freq: Every day | ORAL | Status: DC
  Administered 2023-11-10: 81 mg via ORAL
  Filled 2023-11-09: qty 1

## 2023-11-09 MED ORDER — ACETAMINOPHEN 325 MG PO TABS: 650.0000 mg | ORAL_TABLET | ORAL | Status: DC | PRN

## 2023-11-09 MED ORDER — LABETALOL HCL 5 MG/ML IV SOLN
INTRAVENOUS | Status: DC | PRN
Start: 2023-11-09 — End: 2023-11-09
  Administered 2023-11-09: 10 mg via INTRAVENOUS

## 2023-11-09 MED ORDER — TICAGRELOR 60 MG PO TABS
ORAL_TABLET | ORAL | Status: AC | PRN
  Administered 2023-11-09: 90 mg

## 2023-11-09 MED ORDER — SENNOSIDES-DOCUSATE SODIUM 8.6-50 MG PO TABS: 1.0000 | ORAL_TABLET | Freq: Every evening | ORAL | Status: DC | PRN

## 2023-11-09 MED ORDER — TICAGRELOR 90 MG PO TABS
ORAL_TABLET | ORAL | Status: AC
  Filled 2023-11-09: qty 1

## 2023-11-09 MED ORDER — CLEVIDIPINE BUTYRATE 0.5 MG/ML IV EMUL
INTRAVENOUS | Status: DC | PRN
  Administered 2023-11-09: 2 mg/h via INTRAVENOUS
  Administered 2023-11-09: 7 mg/h via INTRAVENOUS

## 2023-11-09 MED ORDER — ROCURONIUM BROMIDE 10 MG/ML (PF) SYRINGE
PREFILLED_SYRINGE | INTRAVENOUS | Status: DC | PRN
  Administered 2023-11-09: 20 mg via INTRAVENOUS
  Administered 2023-11-09: 100 mg via INTRAVENOUS

## 2023-11-09 MED ORDER — CHLORHEXIDINE GLUCONATE CLOTH 2 % EX PADS
6.0000 | MEDICATED_PAD | Freq: Every day | CUTANEOUS | Status: DC
  Administered 2023-11-09: 6 via TOPICAL

## 2023-11-09 MED ORDER — CANGRELOR TETRASODIUM 50 MG IV SOLR
INTRAVENOUS | Status: AC
  Filled 2023-11-09: qty 50

## 2023-11-09 MED ORDER — OXYCODONE-ACETAMINOPHEN 10-325 MG PO TABS: 1.0000 | ORAL_TABLET | ORAL | Status: DC | PRN

## 2023-11-09 MED ORDER — LIDOCAINE 2% (20 MG/ML) 5 ML SYRINGE
INTRAMUSCULAR | Status: DC | PRN
  Administered 2023-11-09: 60 mg via INTRAVENOUS

## 2023-11-09 MED ORDER — OXYCODONE HCL 5 MG PO TABS: 5.0000 mg | ORAL_TABLET | Freq: Once | ORAL | Status: DC | PRN

## 2023-11-09 MED ORDER — ORAL CARE MOUTH RINSE: 15.0000 mL | OROMUCOSAL | Status: DC | PRN

## 2023-11-09 MED ORDER — FENTANYL CITRATE (PF) 250 MCG/5ML IJ SOLN
INTRAMUSCULAR | Status: DC | PRN
  Administered 2023-11-09 (×2): 50 ug via INTRAVENOUS
  Administered 2023-11-09: 100 ug via INTRAVENOUS

## 2023-11-09 MED ORDER — STROKE: EARLY STAGES OF RECOVERY BOOK
Freq: Once | Status: AC
  Administered 2023-11-10: 1
  Filled 2023-11-09: qty 1

## 2023-11-09 MED ORDER — ONDANSETRON HCL 4 MG/2ML IJ SOLN
INTRAMUSCULAR | Status: DC | PRN
  Administered 2023-11-09: 4 mg via INTRAVENOUS

## 2023-11-09 MED ORDER — OXYCODONE HCL 5 MG/5ML PO SOLN: 5.0000 mg | Freq: Once | ORAL | Status: DC | PRN

## 2023-11-09 MED ORDER — FENTANYL CITRATE (PF) 100 MCG/2ML IJ SOLN
INTRAMUSCULAR | Status: AC
  Administered 2023-11-09: 50 ug
  Filled 2023-11-09: qty 2

## 2023-11-09 MED ORDER — ASPIRIN 300 MG RE SUPP: 300.0000 mg | Freq: Once | RECTAL | Status: DC

## 2023-11-09 MED ORDER — SODIUM CHLORIDE 0.9 % IV SOLN: INTRAVENOUS | Status: DC

## 2023-11-09 MED ORDER — FENTANYL CITRATE PF 50 MCG/ML IJ SOSY
25.0000 ug | PREFILLED_SYRINGE | Freq: Once | INTRAMUSCULAR | Status: AC
  Administered 2023-11-09: 25 ug via INTRAVENOUS
  Filled 2023-11-09: qty 1

## 2023-11-09 MED ORDER — IOHEXOL 300 MG/ML  SOLN
150.0000 mL | Freq: Once | INTRAMUSCULAR | Status: AC | PRN
  Administered 2023-11-09: 70 mL via INTRA_ARTERIAL

## 2023-11-09 MED ORDER — METOPROLOL TARTRATE 5 MG/5ML IV SOLN
INTRAVENOUS | Status: AC
  Filled 2023-11-09: qty 5

## 2023-11-09 MED ORDER — SODIUM CHLORIDE 0.9 % IV SOLN: INTRAVENOUS | Status: AC

## 2023-11-09 MED ORDER — IOHEXOL 350 MG/ML SOLN
75.0000 mL | Freq: Once | INTRAVENOUS | Status: AC | PRN
  Administered 2023-11-09: 75 mL via INTRAVENOUS

## 2023-11-09 MED ORDER — CEFAZOLIN SODIUM-DEXTROSE 2-4 GM/100ML-% IV SOLN
2.0000 g | INTRAVENOUS | Status: DC
  Filled 2023-11-09: qty 100

## 2023-11-09 MED ORDER — OXYCODONE HCL 5 MG PO TABS
5.0000 mg | ORAL_TABLET | ORAL | Status: DC | PRN
  Administered 2023-11-09 – 2023-11-10 (×3): 5 mg via ORAL
  Filled 2023-11-09 (×3): qty 1

## 2023-11-09 MED ORDER — FENTANYL CITRATE PF 50 MCG/ML IJ SOSY
25.0000 ug | PREFILLED_SYRINGE | INTRAMUSCULAR | Status: DC | PRN
  Administered 2023-11-09: 50 ug via INTRAVENOUS

## 2023-11-09 MED ORDER — ASPIRIN 300 MG RE SUPP
300.0000 mg | Freq: Once | RECTAL | Status: AC
  Administered 2023-11-09: 300 mg via RECTAL
  Filled 2023-11-09: qty 1

## 2023-11-09 MED ORDER — ASPIRIN 81 MG PO CHEW
324.0000 mg | CHEWABLE_TABLET | Freq: Once | ORAL | Status: AC
  Filled 2023-11-09: qty 4

## 2023-11-09 MED ORDER — SODIUM CHLORIDE 0.9 % IV SOLN: INTRAVENOUS | Status: DC | PRN

## 2023-11-09 MED ORDER — SODIUM CHLORIDE 0.9 % IV SOLN
INTRAVENOUS | Status: AC | PRN
  Administered 2023-11-09: 2 ug/kg/min via INTRAVENOUS

## 2023-11-09 MED ORDER — TICAGRELOR 90 MG PO TABS
180.0000 mg | ORAL_TABLET | Freq: Once | ORAL | Status: DC
  Filled 2023-11-09: qty 2

## 2023-11-09 MED ORDER — ACETAMINOPHEN 160 MG/5ML PO SOLN: 650.0000 mg | ORAL | Status: DC | PRN

## 2023-11-09 MED ORDER — ALBUMIN HUMAN 5 % IV SOLN: INTRAVENOUS | Status: DC | PRN

## 2023-11-09 MED ORDER — TICAGRELOR 90 MG PO TABS
90.0000 mg | ORAL_TABLET | Freq: Two times a day (BID) | ORAL | Status: DC
  Administered 2023-11-09 – 2023-11-10 (×2): 90 mg via ORAL
  Filled 2023-11-09 (×2): qty 1

## 2023-11-09 MED ORDER — ASPIRIN 81 MG PO CHEW: 81.0000 mg | CHEWABLE_TABLET | Freq: Every day | ORAL | Status: DC

## 2023-11-09 MED ORDER — PHENYLEPHRINE HCL-NACL 20-0.9 MG/250ML-% IV SOLN
INTRAVENOUS | Status: DC | PRN
  Administered 2023-11-09: 50 ug/min via INTRAVENOUS

## 2023-11-09 NOTE — Anesthesia Preprocedure Evaluation (Signed)
 Anesthesia Evaluation  Patient identified by MRN, date of birth, ID band Patient awake    Reviewed: Allergy & Precautions, H&P , NPO status , Patient's Chart, lab work & pertinent test results  Airway Mallampati: II   Neck ROM: full    Dental   Pulmonary COPD, former smoker   breath sounds clear to auscultation       Cardiovascular hypertension, + CAD   Rhythm:regular Rate:Normal     Neuro/Psych  Headaches PSYCHIATRIC DISORDERS Anxiety     Code stroke  Neuromuscular disease CVA    GI/Hepatic ,GERD  ,,  Endo/Other    Renal/GU      Musculoskeletal  (+) Arthritis ,  Fibromyalgia -  Abdominal   Peds  Hematology   Anesthesia Other Findings   Reproductive/Obstetrics                             Anesthesia Physical Anesthesia Plan  ASA: 3 and emergent  Anesthesia Plan: General   Post-op Pain Management:    Induction: Intravenous  PONV Risk Score and Plan: 2 and Ondansetron , Dexamethasone  and Treatment may vary due to age or medical condition  Airway Management Planned: Oral ETT  Additional Equipment: Arterial line  Intra-op Plan:   Post-operative Plan: Extubation in OR  Informed Consent: I have reviewed the patients History and Physical, chart, labs and discussed the procedure including the risks, benefits and alternatives for the proposed anesthesia with the patient or authorized representative who has indicated his/her understanding and acceptance.     Dental advisory given  Plan Discussed with: CRNA, Anesthesiologist and Surgeon  Anesthesia Plan Comments:        Anesthesia Quick Evaluation

## 2023-11-09 NOTE — Code Documentation (Signed)
 Stroke Response Nurse Documentation Code Documentation  GILMER RABUCK is a 58 y.o. male arriving to [facility] ED via Private Vehicle on 11/09/2023 with past medical hx of CVA, HTN, HLD, CAD. On No antithrombotic. Code IR activated at [facility]. Patient transferred to [facility] for IR intervention.   Unclear LKW. NIHSS 14, see documentation for details and code stroke times. Patient with disoriented, left facial droop, left arm weakness, left leg weakness, left limb ataxia, left decreased sensation, Expressive aphasia , dysarthria , and Sensory  neglect on exam.   Patient is not a candidate for IV Thrombolytic due to LKW unclear. Patient is a candidate for IR.   Care Plan: Pt transferred to [facility] for IR.    Process Delays Noted: n/a  Bedside handoff with IR RN Mamie Searles L Louis Gaw  Rapid Response RN

## 2023-11-09 NOTE — Anesthesia Procedure Notes (Addendum)
 Procedure Name: Intubation Date/Time: 11/09/2023 12:38 PM  Performed by: Bennett Brass, CRNAPre-anesthesia Checklist: Patient identified, Emergency Drugs available, Suction available and Patient being monitored Patient Re-evaluated:Patient Re-evaluated prior to induction Oxygen Delivery Method: Circle system utilized Preoxygenation: Pre-oxygenation with 100% oxygen Induction Type: IV induction Ventilation: Mask ventilation without difficulty Laryngoscope Size: Miller and 2 Grade View: Grade I Tube type: Oral Tube size: 7.5 mm Number of attempts: 1 Airway Equipment and Method: Stylet and Oral airway Placement Confirmation: ETT inserted through vocal cords under direct vision, positive ETCO2 and breath sounds checked- equal and bilateral Secured at: 24 cm Tube secured with: Tape Dental Injury: Teeth and Oropharynx as per pre-operative assessment  Comments: Pt has upper and lower dentures in-unable to remove them--left in place. Dentures in the same condition after intubation.

## 2023-11-09 NOTE — Transfer of Care (Signed)
 Immediate Anesthesia Transfer of Care Note  Patient: Mathew Brady  Procedure(s) Performed: RADIOLOGY WITH ANESTHESIA  Patient Location: PACU  Anesthesia Type:General  Level of Consciousness: awake, alert , and oriented  Airway & Oxygen Therapy: Patient Spontanous Breathing and Patient connected to nasal cannula oxygen  Post-op Assessment: Report given to RN and Post -op Vital signs reviewed and stable  Post vital signs: Reviewed and stable  Last Vitals:  Vitals Value Taken Time  BP    Temp    Pulse    Resp    SpO2      Last Pain:  Vitals:   11/09/23 1044  TempSrc:   PainSc: 0-No pain         Complications: No notable events documented.

## 2023-11-09 NOTE — ED Provider Notes (Signed)
 [place] EMERGENCY DEPARTMENT AT [facility] Provider Note   CSN: [id number] Arrival date & time: 11/09/23  1037  An emergency department physician performed an initial assessment on this suspected stroke patient at 1048.  History  Chief Complaint  Patient presents with   Code Stroke    Mathew Brady is a 58 y.o. male.  HPI    58 year old M with PMH of stroke in January (dizziness ), COPD, HTN, anxiety, chronic back pain and tobacco use disorder comes in with chief complaint of acute neurologic changes.  Patient is accompanied by wife.    According to the patient, at 10 AM he was in the shower when he started having sudden left-sided weakness and dizziness.  Patient denies any severe headache. Per wife, patient had a stroke event in January, at that time he had some dizziness, and he did not have symptoms like this at that time.  Patient is supposed to be on aspirin  and Plavix .  Wife indicated that patient is supposed to get right shoulder surgery and therefore asked to discontinue Plavix  a week before the surgery.  She thought patient was taking the medication, however patient states that he has stopped taking both of those medicines as he was asked to stop him for the surgery.  The surgery scheduled in 10 days.  Home Medications Prior to Admission medications   Medication Sig Start Date End Date Taking? Authorizing Provider  albuterol  (PROVENTIL  HFA;VENTOLIN  HFA) 108 (90 Base) MCG/ACT inhaler Inhale 2 puffs into the lungs every 4 (four) hours as needed for wheezing or shortness of breath. 03/25/17   Nestor, Jennings E, MD  amLODipine  (NORVASC ) 10 MG tablet Take 10 mg by mouth daily in the afternoon.    [provider]  gabapentin  (NEURONTIN ) 800 MG tablet Take 1 tablet (800 mg total) by mouth 3 (three) times daily. Patient taking differently: Take 800 mg by mouth in the morning, at noon, in the evening, and at bedtime. 08/01/23   Gonfa, Taye T, MD   oxyCODONE -acetaminophen  (PERCOCET) 10-325 MG tablet Take 1 tablet by mouth in the morning, at noon, in the evening, and at bedtime. 03/05/18   [provider]  rosuvastatin  (CRESTOR ) 20 MG tablet Take 1 tablet (20 mg total) by mouth daily. Patient taking differently: Take 20 mg by mouth every evening. 09/26/23 12/25/23  Cassandra Cleveland, MD      Allergies    Succinylcholine and Pregabalin     Review of Systems   Review of Systems  All other systems reviewed and are negative.   Physical Exam Updated Vital Signs BP (!) 183/89   Pulse 72   Temp (!) 97.3 F (36.3 C) (Axillary)   Resp (!) 24   Ht 5\' 9"  (1.753 m)   Wt 88.5 kg   SpO2 94%   BMI 28.80 kg/m  Physical Exam Vitals and nursing note reviewed.  Constitutional:      Appearance: He is well-developed.  HENT:     Head: Atraumatic.  Eyes:     Extraocular Movements: Extraocular movements intact.     Pupils: Pupils are equal, round, and reactive to light.  Cardiovascular:     Rate and Rhythm: Normal rate.  Pulmonary:     Effort: Pulmonary effort is normal.  Musculoskeletal:     Cervical back: Neck supple.  Skin:    General: Skin is warm.  Neurological:     Mental Status: He is alert and oriented to person, place, and time.  Cranial Nerves: Cranial nerve deficit present.     Sensory: Sensory deficit present.     Motor: Weakness present.     Comments: NIH stroke scale of 8.  Patient has partial hemianopia (left lower visual field) Mild left-sided facial droop Drift of the left upper extremity  and left lower extremity (2 points) Mild dysarthria Mild ataxia with finger-to-nose     ED Results / Procedures / Treatments   Labs (all labs ordered are listed, but only abnormal results are displayed) Labs Reviewed  CBG MONITORING, ED - Abnormal; Notable for the following components:      Result Value   Glucose-Capillary 110 (*)    All other components within normal limits  I-STAT CHEM 8, ED - Abnormal;  Notable for the following components:   Glucose, Bld 101 (*)    All other components within normal limits  I-STAT CHEM 8, ED - Abnormal; Notable for the following components:   Glucose, Bld 101 (*)    All other components within normal limits  SARS CORONAVIRUS 2 BY RT PCR    EKG EKG Interpretation Date/Time:  Sunday Nov 09 2023 10:44:30 EDT Ventricular Rate:  67 PR Interval:  148 QRS Duration:  153 QT Interval:  447 QTC Calculation: 472 R Axis:   -78  Text Interpretation: Sinus rhythm RBBB and LAFB No acute changes No significant change since last tracing Confirmed by Deatra Face [phone]) on 11/09/2023 11:47:36 AM  Radiology CT ANGIO HEAD NECK W WO CM W PERF (CODE STROKE) Result Date: 11/09/2023 CLINICAL DATA:  Headaches with left-sided deficits for 2 days. EXAM: CT ANGIOGRAPHY HEAD AND NECK CT PERFUSION BRAIN TECHNIQUE: Multidetector CT imaging of the head and neck was performed using the standard protocol during bolus administration of intravenous contrast. Multiplanar CT image reconstructions and MIPs were obtained to evaluate the vascular anatomy. Carotid stenosis measurements (when applicable) are obtained utilizing NASCET criteria, using the distal internal carotid diameter as the denominator. Multiphase CT imaging of the brain was performed following IV bolus contrast injection. Subsequent parametric perfusion maps were calculated using RAPID software. RADIATION DOSE REDUCTION: This exam was performed according to the departmental dose-optimization program which includes automated exposure control, adjustment of the mA and/or kV according to patient size and/or use of iterative reconstruction technique. CONTRAST:  100mL OMNIPAQUE  IOHEXOL  350 MG/ML SOLN COMPARISON:  CT head without contrast 11/09/2023. MR head without and with contrast and MR angio head 07/31/2023. FINDINGS: CTA NECK FINDINGS Aortic arch: Common origin of the left common carotid artery and the innominate artery is noted.  Atherosclerotic calcifications are present distal arch. Atherosclerotic changes are present the proximal left subclavian artery. Lumen is narrowed to 2 mm. This compares to more distal vessel of 8 mm. Right carotid system: Right common carotid artery is within normal limits. Bifurcation is unremarkable. Calcifications are present the proximal right ICA without significant stenosis. Moderate tortuosity is present in the distal cervical right ICA without significant stenosis. Left carotid system: Left common carotid artery is within normal limits. Minimal calcifications are present the bifurcation without significant stenosis. Moderate tortuosity is present in the mid cervical left ICA without focal stenosis. Vertebral arteries: Left vertebral artery is the dominant vessel. Both vertebral arteries originate from the subclavian arteries without significant stenosis. No significant stenosis is present in either vertebral artery in the neck. Skeleton: Vertebral body heights alignment are normal. Endplate changes and uncovertebral spurring most evident at C3-4 and C4-5, left greater than right. The patient is edentulous. No focal osseous lesions  are present. Other neck: The soft tissues of the neck are unremarkable. Salivary glands are within normal limits. Thyroid  is normal. No significant adenopathy is present. No focal mucosal or submucosal lesions are present. Upper chest: The lung apices are clear. The thoracic inlet is within normal limits. Review of the MIP images confirms the above findings CTA HEAD FINDINGS Anterior circulation: Minimal atherosclerotic changes are present within the cavernous internal carotid arteries bilaterally without significant stenosis through the ICA termini. The A1 normal bilaterally. The anterior communicating artery is patent. The left M1 segment is normal. The distal right M 1 segment is occluded. Distal right MCA branch vessels are reconstituted via collaterals, less robust than on  the left. Mild attenuation of distal ACA branch vessels and distal left MCA branch vessels. No significant proximal stenosis or occlusion. No aneurysm. Posterior circulation: The PICA origins are visualized and normal. Vertebrobasilar junction and basilar artery normal. Dominant left AICA vessel is present. The superior cerebellar arteries are patent bilaterally. Both posterior cerebral arteries originate from basilar tip. PCA branch vessels are normal bilaterally. No aneurysm is present. Venous sinuses: The dural sinuses are patent. The straight sinus and deep cerebral veins are intact. Cortical veins are within normal limits. No significant vascular malformation is evident. Anatomic variants: None Review of the MIP images confirms the above findings CT Brain Perfusion Findings: ASPECTS: 10/10 CBF (<30%) Volume: 0mL Perfusion (Tmax>6.0s) volume: 0mL Mismatch Volume: 0mL Of note, T-max asymmetry is noted in the right MCA distribution at 4.0 seconds. This suggests there is a subtle perfusion asymmetry. IMPRESSION: 1. Occlusion of the distal right M1 segment. 2. Distal right MCA branch vessels are reconstituted via collaterals, less robust than on the left. 3. Mild attenuation of distal ACA branch vessels and distal left MCA branch vessels without significant proximal stenosis or occlusion. 4. Minimal atherosclerotic changes at the carotid bifurcations and cavernous internal carotid arteries bilaterally without significant stenosis. 5. Moderate tortuosity of the mid cervical internal carotid arteries bilaterally without focal stenosis. 6. Atherosclerotic changes at the proximal left subclavian artery with luminal narrowing to 2 mm. This compares to more distal vessel of 8 mm. 7. Normal CT perfusion using standard criteria. The above was relayed via text pager to Dr. Baldwin Levee on 11/09/2023 at 11:24 . Electronically Signed   By: Audree Leas M.D.   On: 11/09/2023 11:40   CT HEAD CODE STROKE WO  CONTRAST Result Date: 11/09/2023 CLINICAL DATA:  Code stroke. Headache with left-sided deficits for 2 days. EXAM: CT HEAD WITHOUT CONTRAST TECHNIQUE: Contiguous axial images were obtained from the base of the skull through the vertex without intravenous contrast. RADIATION DOSE REDUCTION: This exam was performed according to the departmental dose-optimization program which includes automated exposure control, adjustment of the mA and/or kV according to patient size and/or use of iterative reconstruction technique. COMPARISON:  CT head without contrast and MR head without and with contrast 07/31/2023 FINDINGS: Brain: Expected evolution of the previously seen right occipital lobe infarct is noted. No new or acute infarcts are evident. Focal hypoattenuation along the anterior margin of the left lentiform nucleus is stable. Deep gray nuclei are otherwise within normal limits. The ventricles are of normal size. No significant white matter lesions are present. No significant extraaxial fluid collection is present. The brainstem and cerebellum are within normal limits. Midline structures are within normal limits. Vascular: No hyperdense vessel or unexpected calcification. Skull: Calvarium is intact. No focal lytic or blastic lesions are present. No significant extracranial soft tissue lesion  is present. Sinuses/Orbits: The paranasal sinuses and mastoid air cells are clear. The globes and orbits are within normal limits. ASPECTS [facility] Stroke Program Early CT Score) - Ganglionic level infarction (caudate, lentiform nuclei, internal capsule, insula, M1-M3 cortex): 7/7 - Supraganglionic infarction (M4-M6 cortex): 3/3 Total score (0-10 with 10 being normal): 10/10 IMPRESSION: 1. No acute intracranial abnormality or significant interval change. 2. Expected evolution of the previously seen right occipital lobe infarct. 3. Stable focal hypoattenuation along the anterior margin of the left lentiform nucleus. This likely  represents a small chronic lacunar infarct. 4. Aspects is 10/10. The above was relayed via text pager to Dr. Baldwin Levee on 11/09/2023 at 11:06 . Electronically Signed   By: Audree Leas M.D.   On: 11/09/2023 11:06    Procedures .Critical Care  Performed by: Deatra Face, MD Authorized by: Deatra Face, MD   Critical care provider statement:    Critical care time (minutes):  44   Critical care was necessary to treat or prevent imminent or life-threatening deterioration of the following conditions:  CNS failure or compromise   Critical care was time spent personally by me on the following activities:  Development of treatment plan with patient or surrogate, discussions with consultants, evaluation of patient's response to treatment, examination of patient, ordering and review of laboratory studies, ordering and review of radiographic studies, ordering and performing treatments and interventions, pulse oximetry, re-evaluation of patient's condition, review of old charts and obtaining history from patient or surrogate     Medications Ordered in ED Medications  ticagrelor (BRILINTA) tablet 180 mg (has no administration in time range)  aspirin  chewable tablet 324 mg (has no administration in time range)    Or  aspirin  suppository 300 mg (has no administration in time range)  fentaNYL  (SUBLIMAZE ) injection 25 mcg (has no administration in time range)  iohexol  (OMNIPAQUE ) 350 MG/ML injection 100 mL (100 mLs Intravenous Contrast Given 11/09/23 1111)    ED Course/ Medical Decision Making/ A&P                                 Medical Decision Making This patient presents to the ED with chief complaint(s) of acute left-sided weakness, slurred speech with pertinent past medical history of stroke in January, COPD, tobacco use disorder.patient has not had stroke scale of 8 on my assessment, with left-sided hemiparesis worse in the left lower extremity.  He does have some visual field  deficits, therefore LVO screen is positive.   Code stroke was activated upon arrival.  The complaint involves an extensive differential diagnosis and also carries with it a high risk of complications and morbidity.    The differential diagnosis includes: Acute ischemic stroke, acute hemorrhagic stroke, acute embolic stroke, acute thrombotic stroke. No chest pain, shortness of breath.  Clinically does not appear that patient has dissection.  The initial plan is to continue with code stroke activation.  Additional history obtained: Additional history obtained from spouse Records reviewed previous admission documents.  In January, his presenting symptoms was dizziness.  He had right occipital stroke, with some microhemorrhage.   Independent labs interpretation:  The following labs were independently interpreted: I-STAT Chem-8 is normal.  Independent visualization and interpretation of imaging: - I independently visualized the following imaging with scope of interpretation limited to determining acute life threatening conditions related to emergency care: CT scan of the brain, which revealed no evidence of acute bleed.  Treatment and  Reassessment: Patient reassessed.  Neurology team is already talking to the patient about transfer.  Patient made aware that he had called EMS for prompt transfer to [facility].  He has failed oral challenge, therefore rectal aspirin  ordered.  Consultation: - Consulted or discussed management/test interpretation with external professional: Neurology, they have requested patient be transferred to [facility], accepted by IR.  Dr. Komer has accepted patient.  Final Clinical Impression(s) / ED Diagnoses Final diagnoses:  Acute ischemic stroke [facility])    Rx / DC Orders ED Discharge Orders     None         Deatra Face, MD 11/09/23 1152

## 2023-11-09 NOTE — ED Notes (Signed)
CBG 110 in triage

## 2023-11-09 NOTE — Progress Notes (Signed)
 Telestroke Note    1039: Code stroke cart activated by nursing for patient who presents to the ED with concerns for left sided weakness, slurred speech, left facial droop, and mental fogginess. Per staff, LKW 1000. mRS 0.   1040: Dr.Bhagat [facility]SMD paged.   1041: Dr. Gae Jointer EDP at bedside assessing patient.   1042: Dr. Cleone Dad on camera. Patient history and report provided to Dr.Bhagat by Dr.Ankit.   1043: NIHSS started by Dr.Bhagat.   1049: NIHSS completed by Dr. Cleone Dad.   1051: Patient left for CT.   1053: Patient arrived in CT. Patient reports headaches x 2 days.   1055: Dr. Cleone Dad speaking with patients wife. Per patients wife, LKW unclear. Advanced imaging to be obtained per Dr. Cleone Dad.   1111: Dr.Bhagat reviewing CTA images.   1112: Advanced imaging complete.   1113: Dr. Cleone Dad discussing imaging results with patient. Patient confirms left arm weakness was present on 5/4.   1119: Patient returned from CT.   1123: Dr. Cleone Dad and interventional radiologist speaking with patient and his wife.   1126: Interventional radiology called wife at this time and is speaking to patient and wife regarding intervention.   1131: Patient to transfer to [facility] for intervention.   1135: No further needs from telestroke nurse per Dr. Cleone Dad. Logged off stroke cart at this time.    Belvie Boyers Telestroke RN

## 2023-11-09 NOTE — Consult Note (Signed)
 [facility] Telemedicine Consult   Requesting Provider: Dr. Lujean Sake  Consult Participants: Myself, bedside nurse, atrium nurse, patient wife, Dr. Alvira Josephs by phone Location of the provider: [facility] Location of the patient: [facility]   This consult was provided via telemedicine with 2-way video and audio communication. The patient/family was informed that care would be provided in this way and agreed to receive care in this manner.    Chief Complaint: Left sided weakness  HPI: [name] is an a 58 year old man with past medical history significant for known right MCA inferior division stenosis, prior right occipital stroke without residual deficit, hypertension, hyperlipidemia, chronic back and shoulder pain, coronary artery disease, peripheral neuropathy, fibromyalgia  Of note he had an subacute stroke predominantly affecting the right occipital lobe on found on 08/01/2023 at which time the right MCA critical stenosis was found.  He had presented with dizziness and confusion and concern for seizure-like episodes as well, EEG was negative.  He was recommended for 90 days of DAPT.  He had outpatient follow-up with neurology for surgical clearance 09/26/2023 at which time delaying his procedure for at least 6 months was recommended.  He reported he stopped aspirin  and Plavix  recently for planned shoulder surgery 5/14  Wife notes that he has cut back from smoking several packs a day to about 3 cigarettes a week  However she notes he has continued to intermittently have blurred vision and left upper extremity weakness.  She has seen him shake the limb from time to time.  He has also been complaining of a headache for the past 1 week per wife (2 days per patient) which is atypical for him.  Wife notes that she did see him having some left arm weakness even yesterday  LKW: Unclear, fluctuating symptoms at least 2 days  Thrombolytic given?: No, due to unclear LKW IR Thrombectomy?  Yes Modified Rankin Scale: 1-No significant post stroke disability and can perform usual duties with stroke symptoms Time of teleneurologist evaluation: 10:42 PM  Exam: Vitals:   11/09/23 1043  BP: (!) 175/93  Pulse: 69  Resp: 16  Temp: (!) 97.3 F (36.3 C)  SpO2: 98%    General: No acute distress. At times slightly confused appearing / slow to respond  Pulmonary: breathing comfortably Cardiac: regular rate and rhythm on monitor   NIH Stroke scale 1A: Level of Consciousness - 0 1B: Ask Month and Age - 0 1C: 'Blink Eyes' & 'Squeeze Hands' - 0 2: Test Horizontal Extraocular Movements - 0 3: Test Visual Fields - 1 LLQ deficit  4: Test Facial Palsy - 1-3 fluctuating  5A: Test Left Arm Motor Drift - 1-3 fluctuating  5B: Test Right Arm Motor Drift - 0 6A: Test Left Leg Motor Drift - 1-3 fluctuating  6B: Test Right Leg Motor Drift - 0 7: Test Limb Ataxia - 2 (mild ataxia on RUE and RLE  8: Test Sensation - 1 (left arm and leg) 9: Test Language/Aphasia- 0 10: Test Dysarthria - 1 11: Test Extinction/Inattention - 0 NIHSS score: 8 - 12   Imaging personally reviewed:   CT head 1. No acute intracranial abnormality or significant interval change. 2. Expected evolution of the previously seen right occipital lobe infarct. 3. Stable focal hypoattenuation along the anterior margin of the left lentiform nucleus. This likely represents a small chronic lacunar infarct. 4. Aspects is 10/10.  CTA head and neck personally reviewed, agree with radiology report 1. Occlusion of the distal right M1 segment. 2.  Distal right MCA branch vessels are reconstituted via collaterals, less robust than on the left. 3. Mild attenuation of distal ACA branch vessels and distal left MCA branch vessels without significant proximal stenosis or occlusion. 4. Minimal atherosclerotic changes at the carotid bifurcations and cavernous internal carotid arteries bilaterally without significant stenosis. 5.  Moderate tortuosity of the mid cervical internal carotid arteries bilaterally without focal stenosis. 6. Atherosclerotic changes at the proximal left subclavian artery with luminal narrowing to 2 mm. This compares to more distal vessel of 8 mm. 7. Normal CT perfusion using standard criteria. [However oligemia is present on the left side]    Labs reviewed in epic and pertinent values follow:  Basic Metabolic Panel: Recent Labs  Lab 11/06/23 1420 11/09/23 1049 11/09/23 1055  NA 137 141 141  K 4.3 3.9 3.8  CL 103 104 105  CO2 28  --   --   GLUCOSE 90 101* 101*  BUN 16 7 6   CREATININE 1.06 0.90 1.00  CALCIUM  9.1  --   --     CBC: Recent Labs  Lab 11/06/23 1420 11/09/23 1049 11/09/23 1055  WBC 10.2  --   --   HGB 15.6 16.0 15.6  HCT 48.7 47.0 46.0  MCV 98.2  --   --   PLT 330  --   --     Coagulation Studies: No results for input(s): "LABPROT", "INR" in the last 72 hours.     Assessment: Fluctuating right-sided symptoms without evidence of jerking or shaking movements highly concerning for symptomatic acute right distal M1 occlusion with high potential for failed collaterals  Unfortunately due to some symptoms as recently as yesterday, especially right hemispheric symptoms with high potential for neglect of symptoms, and patient providing different history to me then wife, I do not feel confident in his last known well and do not feel that the risks of TNK would outweigh benefits in that setting as there is high potential for small subacute strokes that would have a high bleeding risk.  Additionally TNK may increase risk of procedure as there is concern stent is likely to be needed, and thrombectomy alone is noninferior to TNK with thrombolytic in at least some recent studies  Recommendations:  - No thrombolytic as discussed in assessment above - Emergent transfer for thrombectomy, discussed that patient is likely to need stent placement - Consent witnessed by myself via  video, performed by Dr. Alvira Josephs via phone - Permissive hypertension to 220/110 for now, postprocedure goals per neuro IR - Swallow evaluation failed, rectal aspirin  300 mg administered, further antiplatelet agents per neuro IR - Stroke team to admit patient on arrival at [facility]  This patient is receiving care for possible acute neurological changes. There was 80 minutes of care by this provider at the time of service, including time for direct evaluation via telemedicine, review of medical records, imaging studies and discussion of findings with providers, the patient and/or family.  Consult level time thresholds, 20 min (1), 40 min (2), 55 min (3), 80 min (4), 110 min (5)  Baldwin Levee MD-PhD [facility] [phone]   If 8pm-8am, please page neurology on call as listed in AMION.  CRITICAL CARE Performed by: Ronnette Coke   Total critical care time: 70 minutes  Critical care time was exclusive of separately billable procedures and treating other patients.  Critical care was necessary to treat or prevent imminent or life-threatening deterioration.  Critical care was time spent personally by me on the following activities:  development of treatment plan with patient and/or surrogate as well as nursing, discussions with consultants, evaluation of patient's response to treatment, examination of patient, obtaining history from patient or surrogate, ordering and performing treatments and interventions, ordering and review of laboratory studies, ordering and review of radiographic studies, pulse oximetry and re-evaluation of patient's condition.

## 2023-11-09 NOTE — ED Triage Notes (Signed)
 Pt BIB wife for left arm weakness and leg weakness. Pt has left arm weakness and leg weakness starting around 1000.

## 2023-11-09 NOTE — H&P (Addendum)
 NEUROLOGY H&P NOTE   Date of service: Nov 09, 2023 Patient Name: [facility] MRN:  [id number] DOB:  1966/04/18 Chief Complaint: Left sided weakness  History of Present Illness  [facility] is a 57 y.o. male with a PMHx of anemia, anxiety, arhtritis, CAD, COPD, prior complication of anesthesia, HTN, fibromyalgia, kidney stones, GERD, opiate use, peripheral neuropathy, prior stroke, pulmonary nodule and B12 deficiency who presents emergently from the [facility] ED as a Code IR for emergent thrombectomy after he presented to [facility] with acute onset of fluctuating left hemiparesis, left facial droop, left sided sensory loss and dysarthria. He was seen by Teleneurology at [facility] and was determined to not be a TNK candidate due to unknown LKW. NIHSS at [facility] ranged between 8-12 due to fluctuating deficits. CTA head revealed a right M1 occlusion. Informed consent was obtained for thrombectomy and he was emergently transported to [facility] for the procedure.   CT head at [facility]: 1. No acute intracranial abnormality or significant interval change. 2. Expected evolution of the previously seen right occipital lobe infarct. 3. Stable focal hypoattenuation along the anterior margin of the left lentiform nucleus. This likely represents a small chronic lacunar infarct. 4. Aspects is 10/10.  CTA of head and neck at [facility]:  1. Occlusion of the distal right M1 segment. 2. Distal right MCA branch vessels are reconstituted via collaterals, less robust than on the left. 3. Mild attenuation of distal ACA branch vessels and distal left MCA branch vessels without significant proximal stenosis or occlusion. 4. Minimal atherosclerotic changes at the carotid bifurcations and cavernous internal carotid arteries bilaterally without significant stenosis. 5. Moderate tortuosity of the mid cervical internal carotid arteries bilaterally without focal stenosis. 6. Atherosclerotic changes at the proximal left subclavian artery with luminal  narrowing to 2 mm. This compares to more distal vessel of 8 mm. 7. Normal CT perfusion using standard criteria. [However oligemia is present on the left side]  Per Teleneurology note: "[name] is an a 58 year old man with past medical history significant for known right MCA inferior division stenosis, prior right occipital stroke without residual deficit, hypertension, hyperlipidemia, chronic back and shoulder pain, coronary artery disease, peripheral neuropathy, fibromyalgia Of note he had an subacute stroke predominantly affecting the right occipital lobe on found on 08/01/2023 at which time the right MCA critical stenosis was found.  He had presented with dizziness and confusion and concern for seizure-like episodes as well, EEG was negative.  He was recommended for 90 days of DAPT. He had outpatient follow-up with neurology for surgical clearance 09/26/2023 at which time delaying his procedure for at least 6 months was recommended. He reported he stopped aspirin  and Plavix  recently for planned shoulder surgery 5/14. Wife notes that he has cut back from smoking several packs a day to about 3 cigarettes a week. However she notes he has continued to intermittently have blurred vision and left upper extremity weakness.  She has seen him shake the limb from time to time.  He has also been complaining of a headache for the past 1 week per wife (2 days per patient) which is atypical for him.  Wife notes that she did see him having some left arm weakness even yesterday."  Modified rankin score: 1-No significant post stroke disability and can perform usual duties with stroke symptoms  NIHSS after arrival to [facility]: NIHSS components Score: Comment  1a Level of Conscious 0[x]  1[]  2[]  3[]      1b LOC Questions 0[x]  1[]   2[]       1c LOC Commands 0[x]  1[]  2[]       2 Best Gaze 0[x]  1[]  2[]       3 Visual 0[]  1[x]  2[]  3[]     Left crescentic peripheral visual field deficit in both eyes, worse to upper quadrants  4  Facial Palsy 0[]  1[x]  2[]  3[]      5a Motor Arm - left 0[]  1[]  2[x]  3[]  4[]  UN[]    5b Motor Arm - Right 0[x]  1[]  2[]  3[]  4[]  UN[]    6a Motor Leg - Left 0[]  1[x]  2[]  3[]  4[]  UN[]    6b Motor Leg - Right 0[x]  1[]  2[]  3[]  4[]  UN[]    7 Limb Ataxia 0[x]  1[]  2[]  UN[]      8 Sensory 0[]  1[x]  2[]  UN[]      9 Best Language 0[x]  1[]  2[]  3[]      10 Dysarthria 0[]  1[x]  2[]  UN[]      11 Extinct. and Inattention 0[]  1[x]  2[]       TOTAL:   8      ROS  As per HPI. Detailed ROS deferred due to acuity of presentation.   Past History   Past Medical History:  Diagnosis Date   Anemia 06/18/2012   Anxiety    Arthritis    CAD (coronary artery disease)    Mild to moderate nonobstructive CAD at cardiac catheterization 2018   Complication of anesthesia    hard to wake up   COPD (chronic obstructive pulmonary disease) (HCC)    Essential hypertension    Fibromyalgia    GERD (gastroesophageal reflux disease)    History of kidney stones    Opiate use    Palpitations    Peripheral neuropathy    Pulmonary nodule    Stroke (HCC)    Vitamin B 12 deficiency     Past Surgical History:  Procedure Laterality Date   COLONOSCOPY     found to have 1 polyp   ESOPHAGEAL DILATION  1993   LEFT HEART CATH AND CORONARY ANGIOGRAPHY N/A 12/09/2016   Procedure: Left Heart Cath and Coronary Angiography;  Surgeon: Sammy Crisp, MD;  Location: [facility] INVASIVE CV LAB;  Service: Cardiovascular;  Laterality: N/A;   LUNG SURGERY Left    2018 upper lobe   TONSILLECTOMY     VIDEO ASSISTED THORACOSCOPY (VATS)/WEDGE RESECTION Left 09/08/2017   Procedure: VIDEO ASSISTED THORACOSCOPY (VATS)/WEDGE RESECTION;  Surgeon: Zelphia Higashi, MD;  Location: [facility] OR;  Service: Thoracic;  Laterality: Left;   Family History  Problem Relation Age of Onset   Fibromyalgia Mother    Bladder Cancer Father 79   Benign prostatic hyperplasia Father    Fibromyalgia Sister    Lung cancer Maternal Uncle    Emphysema Maternal Uncle    Lung  cancer Paternal Uncle    Stomach cancer Paternal Uncle    Lung cancer Maternal Grandmother    Heart failure Paternal Grandmother    Heart failure Paternal Grandfather    Lung cancer Paternal Grandfather    Healthy Son     Social History   Socioeconomic History   Marital status: Married    Spouse name: Not on file   Number of children: Not on file   Years of education: Not on file   Highest education level: Not on file  Occupational History   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  Tobacco Use   Smoking status: Former    Current packs/day: 0.00    Average packs/day: 1 pack/day for 36.0 years (36.0 ttl pk-yrs)    Types:  Cigarettes    Start date: 03/12/1979    Quit date: 03/09/2015    Years since quitting: 8.6   Smokeless tobacco: Never   Tobacco comments:    1ppd 12/14/21, on nicotine  patches ( since January 2025)  Vaping Use   Vaping status: Never Used  Substance and Sexual Activity   Alcohol use: No    Alcohol/week: 0.0 standard drinks of alcohol   Drug use: No   Sexual activity: Not on file  Other Topics Concern   Not on file  Social History Narrative   Lives with wife in a 2 story home.  Has 5 children all together.  Not working at this time.   On Leave.  Run shop contruction site.   Education: Some high school.      [facility] (12/13/16):   Originally from [place]. 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 Currently has a Cockatoo and chickens. Also has horses, a donkey, dogs, and a cat. No mold exposure. No hot tub exposure.    Social Drivers of Corporate investment banker Strain: Low Risk  (12/29/2020)   Received from [facility], [facility]   Overall Financial Resource Strain (CARDIA)    Difficulty of Paying Living Expenses: Not hard at all  Food Insecurity: No Food Insecurity (08/01/2023)   Hunger Vital Sign    Worried About Running Out of Food in the Last Year: Never true    Ran Out of Food in the Last  Year: Never true  Transportation Needs: No Transportation Needs (08/01/2023)   PRAPARE - Administrator, Civil Service (Medical): No    Lack of Transportation (Non-Medical): No  Physical Activity: Not on file  Stress: Not on file  Social Connections: Not on file   Allergies  Allergen Reactions   Succinylcholine Anaphylaxis   Pregabalin  Other (See Comments) and Photosensitivity    Medications   Current Facility-Administered Medications:    iohexol  (OMNIPAQUE ) 300 MG/ML solution 150 mL, 150 mL, Intra-arterial, Once PRN, Deveshwar, Sanjeev, MD   ticagrelor (BRILINTA) tablet 180 mg, 180 mg, Oral, Once, Bhagat, Srishti L, MD  Current Outpatient Medications:    albuterol  (PROVENTIL  HFA;VENTOLIN  HFA) 108 (90 Base) MCG/ACT inhaler, Inhale 2 puffs into the lungs every 4 (four) hours as needed for wheezing or shortness of breath., Disp: 1 Inhaler, Rfl: 6   amLODipine  (NORVASC ) 10 MG tablet, Take 10 mg by mouth daily in the afternoon., Disp: , Rfl:    aspirin  EC 81 MG tablet, Take 81 mg by mouth daily. Swallow whole., Disp: , Rfl:    clopidogrel  (PLAVIX ) 75 MG tablet, Take 1 tablet by mouth daily., Disp: , Rfl:    gabapentin  (NEURONTIN ) 800 MG tablet, Take 1 tablet (800 mg total) by mouth 3 (three) times daily. (Patient taking differently: Take 800 mg by mouth in the morning, at noon, in the evening, and at bedtime.), Disp: 90 tablet, Rfl: 0   HYDROmorphone  (DILAUDID ) 2 MG tablet, Take 2 mg by mouth every 4 (four) hours., Disp: , Rfl:    methocarbamol  (ROBAXIN ) 500 MG tablet, Take 1 tablet by mouth 4 (four) times daily., Disp: , Rfl:    metoprolol  succinate (TOPROL -XL) 25 MG 24 hr tablet, Take 1 tablet by mouth daily., Disp: , Rfl:    ondansetron  (ZOFRAN ) 4 MG tablet, Take 4 mg by mouth every 8 (eight) hours as needed for refractory nausea / vomiting, vomiting or nausea., Disp: , Rfl:  oxyCODONE -acetaminophen  (PERCOCET) 10-325 MG tablet, Take 1 tablet by mouth in the morning, at noon,  in the evening, and at bedtime., Disp: , Rfl:    rosuvastatin  (CRESTOR ) 20 MG tablet, Take 1 tablet (20 mg total) by mouth daily. (Patient taking differently: Take 20 mg by mouth every evening.), Disp: 90 tablet, Rfl: 3  Facility-Administered Medications Ordered in Other Encounters:    lactated ringers  infusion, , Intravenous, Continuous PRN, Bennett Brass, CRNA, New Bag at 11/09/23 1230   lidocaine  2% (20 mg/mL) 5 mL syringe, , Intravenous, Anesthesia Intra-op, Bennett Brass, CRNA, 60 mg at 11/09/23 1235   propofol  (DIPRIVAN ) 10 mg/mL bolus/IV push, , Intravenous, Anesthesia Intra-op, Bennett Brass, CRNA, 150 mg at 11/09/23 1235   rocuronium  (ZEMURON ) injection, , Intravenous, Anesthesia Intra-op, Bennett Brass, CRNA, 100 mg at 11/09/23 1235   Vitals   Vitals:   11/09/23 1115 11/09/23 1139 11/09/23 1140 11/09/23 1145  BP:  (!) 178/100 (!) 183/89 (!) 182/99  Pulse: (!) 58 68 72 66  Resp: 15  (!) 24 16  Temp:      TempSrc:      SpO2: 96% 96% 94% 94%  Weight:      Height:         Body mass index is 28.8 kg/m.  Physical Exam   Constitutional: Appears well-developed and well-nourished.  Psych: Anxious affect in the context of situation.  Eyes: No scleral injection.  HENT: No OP obstruction.  Head: Normocephalic.  Respiratory: Effort normal, non-labored breathing.    Neurologic Examination   See NIHSS.   Labs   CBC:  Recent Labs  Lab 11/06/23 1420 11/09/23 1049 11/09/23 1055  WBC 10.2  --   --   HGB 15.6 16.0 15.6  HCT 48.7 47.0 46.0  MCV 98.2  --   --   PLT 330  --   --    Basic Metabolic Panel:  Lab Results  Component Value Date   NA 141 11/09/2023   K 3.8 11/09/2023   CO2 28 11/06/2023   GLUCOSE 101 (H) 11/09/2023   BUN 6 11/09/2023   CREATININE 1.00 11/09/2023   CALCIUM  9.1 11/06/2023   GFRNONAA >60 11/06/2023   GFRAA >60 09/09/2017   Lipid Panel:  Lab Results  Component Value Date   LDLCALC 62 08/01/2023   HgbA1c:  Lab Results  Component Value  Date   HGBA1C 5.5 07/31/2023   Urine Drug Screen:     Component Value Date/Time   LABOPIA POSITIVE (A) 08/01/2023 0737   COCAINSCRNUR NONE DETECTED 08/01/2023 0737   LABBENZ NONE DETECTED 08/01/2023 0737   AMPHETMU POSITIVE (A) 08/01/2023 0737   THCU NONE DETECTED 08/01/2023 0737   LABBARB NONE DETECTED 08/01/2023 0737    Alcohol Level     Component Value Date/Time   ETH <11 02/10/2012 1845   INR  Lab Results  Component Value Date   INR 1.00 09/08/2017   APTT  Lab Results  Component Value Date   APTT 34 09/08/2017   TTE (08/01/23): 1. Left ventricular ejection fraction, by estimation, is 60 to 65%. The  left ventricle has normal function. The left ventricle has no regional  wall motion abnormalities. There is mild left ventricular hypertrophy.  Left ventricular diastolic parameters  are consistent with Grade I diastolic dysfunction (impaired relaxation).   2. Right ventricular systolic function is normal. The right ventricular  size is normal. Tricuspid regurgitation signal is inadequate for assessing  PA pressure.   3. The mitral valve is normal in  structure. No evidence of mitral valve  regurgitation. No evidence of mitral stenosis.   4. The aortic valve is tricuspid. Aortic valve regurgitation is not  visualized. No aortic stenosis is present.   5. The inferior vena cava is normal in size with greater than 50%  respiratory variability, suggesting right atrial pressure of 3 mmHg.   Carotid ultrasound (08/01/23): 1. Bilateral carotid bifurcation plaque resulting in less than 50% diameter ICA stenosis. 2. Antegrade bilateral vertebral arterial flow.   Assessment  58 year old male with fluctuating right-sided motor symptoms without evidence of jerking or shaking movements, highly concerning for symptomatic acute right distal M1 occlusion with high potential for failed collaterals. Not a candidate for TNK due to unknown LKN.  - Exam upon arrival to [facility] reveals  continued neurological deficits with NIHSS 8.  - CTA at [facility] with occlusion of the distal right M1 segment. Distal right MCA branch vessels are reconstituted via collaterals, less robust than on the left. Other imaging findings as documented in the HPI.  - Brought to IR emergently for thrombectomy with possible stent placement. Informed consent was obtained by Dr. Alvira Josephs during witnessed telephone conversation with the patient while he was still at [facility].  - Will admit to the ICU under the Neurology service after thrombectomy.    Recommendations  - Swallow evaluation failed at [facility]. Will need reevaluation by SLP after thrombectomy.  - Has had a recent TTE. The prior study did not have a bubble study performed and the interatrial septum was not well visualized. Therefore, may need a repeat echocardiogram with bubble study to assess for PFO. May also need a TEE. Will defer to Stroke Team for final decision.  - Admitting to Neuro ICU.after VIR  - Post-VIR order set to include frequent neuro checks and BP management.  - Antiplatelet medications per IR.  - DVT prophylaxis with SCDs.  - Continue his statin.  - MRI brain  - PT/OT/Speech.  - NPO until passes swallow evaluation.  - Telemetry monitoring - Fasting lipid panel, HgbA1c   35 minutes spent in the emergent neurological evaluation and management of this critically ill patient. Time spent included coordination of care.   Addendum: Post-cangrelor infusion CT head:  1. Mild enhancement within the right lentiform nucleus may represent subacute infarct. 2. Remote infarct of the posterior right temporal and occipital lobe is again noted. 3. Stent is now noted within the distal right M1 segment. Right MCA branch vessels opacify postcontrast. 4. Stable mild white matter disease. This likely reflects the sequela of chronic microvascular  ischemia.  ______________________________________________________________________   Hope Ly, Maeve Debord, MD [facility]

## 2023-11-09 NOTE — Procedures (Addendum)
 INR.  Status post right common carotid arteriogram.  Right CFA approach.  Findings.  1.  Occluded right middle cerebral artery mid M1 segment.  Status post complete revascularization of occluded right middle cerebral M1 segment with 1 pass with contact aspiration and 4 mm x 40 mm solitaire X stent retrieval achieving a TIC2C revascularization.    Reocclusion due to severe underlying intracranial arteriosclerosis in the distal right M1 segment.  Status post stent assisted angioplasty,using a 2 mm x 8 mm balloon mounted onyx frontier stent,  of the distal right middle cerebral artery M1 segment with complete revascularization  achieving a TICI 3 revascularization.  Post CT brain mild contrast staining Iof the  posterior right putamen.    8 [place] Angio-Seal used for hemostasis at the right groin puncture site.  Distal pulses all present unchanged compared to prior to the procedure.  Medications given Brilinta 90 mg via orogastric tube right after intracranial angioplasty.  Cangrelor half bolus dose infusion followed by half dose infusion until 5 PM.  CT of the head to be obtained at 5 PM after stopping the IV cangrelor infusion.  Order put in for CT brain.   Patient extubated.  Following commands inconsistently  However moving all fours equally.  No gross facial asymmetry noted.  Pupils 2 to 3 mm reactive bilaterally.  Jory Ng MD.

## 2023-11-09 NOTE — ED Notes (Signed)
 [facility] is in transit for pt

## 2023-11-10 ENCOUNTER — Other Ambulatory Visit (HOSPITAL_COMMUNITY): Payer: Self-pay

## 2023-11-10 ENCOUNTER — Telehealth (HOSPITAL_COMMUNITY): Payer: Self-pay | Admitting: Pharmacy Technician

## 2023-11-10 ENCOUNTER — Encounter (HOSPITAL_COMMUNITY): Payer: Self-pay | Admitting: Radiology

## 2023-11-10 DIAGNOSIS — I2510 Atherosclerotic heart disease of native coronary artery without angina pectoris: Secondary | ICD-10-CM | POA: Diagnosis not present

## 2023-11-10 DIAGNOSIS — I10 Essential (primary) hypertension: Secondary | ICD-10-CM

## 2023-11-10 DIAGNOSIS — I63511 Cerebral infarction due to unspecified occlusion or stenosis of right middle cerebral artery: Secondary | ICD-10-CM | POA: Diagnosis not present

## 2023-11-10 DIAGNOSIS — E785 Hyperlipidemia, unspecified: Secondary | ICD-10-CM | POA: Diagnosis not present

## 2023-11-10 LAB — BASIC METABOLIC PANEL WITH GFR
Anion gap: 10 (ref 5–15)
BUN: 7 mg/dL (ref 6–20)
CO2: 21 mmol/L — ABNORMAL LOW (ref 22–32)
Calcium: 8.4 mg/dL — ABNORMAL LOW (ref 8.9–10.3)
Chloride: 110 mmol/L (ref 98–111)
Creatinine, Ser: 0.97 mg/dL (ref 0.61–1.24)
GFR, Estimated: >60 mL/min (ref >60)
Glucose, Bld: 110 mg/dL — ABNORMAL HIGH (ref 70–99)
Potassium: 3.4 mmol/L — ABNORMAL LOW (ref 3.5–5.1)
Sodium: 141 mmol/L (ref 135–145)

## 2023-11-10 LAB — CBC WITH DIFFERENTIAL/PLATELET
Abs Immature Granulocytes: 0.04 10*3/uL (ref 0.00–0.07)
Basophils Absolute: 0 10*3/uL (ref 0.0–0.1)
Basophils Relative: 0 %
Eosinophils Absolute: 0 10*3/uL (ref 0.0–0.5)
Eosinophils Relative: 0 %
HCT: 38.6 % — ABNORMAL LOW (ref 39.0–52.0)
Hemoglobin: 12.6 g/dL — ABNORMAL LOW (ref 13.0–17.0)
Immature Granulocytes: 0 %
Lymphocytes Relative: 23 %
Lymphs Abs: 2.3 10*3/uL (ref 0.7–4.0)
MCH: 31.6 pg (ref 26.0–34.0)
MCHC: 32.6 g/dL (ref 30.0–36.0)
MCV: 96.7 fL (ref 80.0–100.0)
Monocytes Absolute: 0.6 10*3/uL (ref 0.1–1.0)
Monocytes Relative: 6 %
Neutro Abs: 7 10*3/uL (ref 1.7–7.7)
Neutrophils Relative %: 71 %
Platelets: 291 10*3/uL (ref 150–400)
RBC: 3.99 MIL/uL — ABNORMAL LOW (ref 4.22–5.81)
RDW: 13 % (ref 11.5–15.5)
WBC: 10 10*3/uL (ref 4.0–10.5)
nRBC: 0 % (ref 0.0–0.2)

## 2023-11-10 LAB — LIPID PANEL
Cholesterol: 137 mg/dL (ref 0–200)
HDL: 29 mg/dL — ABNORMAL LOW (ref >40)
LDL Cholesterol: 83 mg/dL (ref 0–99)
Total CHOL/HDL Ratio: 4.7 ratio
Triglycerides: 123 mg/dL (ref <150)
VLDL: 25 mg/dL (ref 0–40)

## 2023-11-10 LAB — GLUCOSE, CAPILLARY
Glucose-Capillary: 121 mg/dL — ABNORMAL HIGH (ref 70–99)
Glucose-Capillary: 120 mg/dL — ABNORMAL HIGH (ref 70–99)

## 2023-11-10 MED ORDER — LOPERAMIDE HCL 1 MG/7.5ML PO SUSP
2.0000 mg | ORAL | Status: DC | PRN
  Administered 2023-11-10: 2 mg via ORAL
  Filled 2023-11-10: qty 15

## 2023-11-10 MED ORDER — HYDRALAZINE HCL 20 MG/ML IJ SOLN: 10.0000 mg | Freq: Four times a day (QID) | INTRAMUSCULAR | Status: DC | PRN

## 2023-11-10 MED ORDER — ASPIRIN 81 MG PO CHEW: 81.0000 mg | CHEWABLE_TABLET | Freq: Every day | ORAL | 1 refills | Status: AC

## 2023-11-10 MED ORDER — AMLODIPINE BESYLATE 10 MG PO TABS
10.0000 mg | ORAL_TABLET | Freq: Every day | ORAL | Status: DC
  Administered 2023-11-10: 10 mg via ORAL
  Filled 2023-11-10: qty 1

## 2023-11-10 MED ORDER — TICAGRELOR 90 MG PO TABS: 90.0000 mg | ORAL_TABLET | Freq: Two times a day (BID) | ORAL | 2 refills | Status: AC

## 2023-11-10 MED ORDER — GABAPENTIN 400 MG PO CAPS
800.0000 mg | ORAL_CAPSULE | Freq: Four times a day (QID) | ORAL | Status: DC
  Administered 2023-11-10 (×2): 800 mg via ORAL
  Filled 2023-11-10 (×2): qty 2

## 2023-11-10 MED ORDER — SODIUM CHLORIDE 0.9 % IV BOLUS
1000.0000 mL | Freq: Once | INTRAVENOUS | Status: AC
  Administered 2023-11-10: 1000 mL via INTRAVENOUS

## 2023-11-10 NOTE — Progress Notes (Addendum)
 Supervising Physician: Luellen Sages  Patient Status:  Mathew Brady - In-pt  Chief Complaint:  S/p common carotid arteriogram with stent assisted angioplasty  Brief Hospital Course: Mathew Brady is a patient with a history of HTN, GERD, CAD, COPD (RA), peripheral neuropathy, prior stroke (had previously been taking plavix  and asa but was directed to pause these for shoulder operation).  He presented with left hemiparesis, L facial droop, L sensation changes, and dysarthria on 11/09/23 at [facility] ED. Unknown LKW. He was activated as a code stroke with NIH scores fluctuating between 8 to 12. CTA head revealed R M1 occlusion. TNK not given. IR consulted, and the patient was emergently transported to [facility] where he underwent arteriogram with stent assisted angioplasty by Dr. Alvira Josephs. Summary of procedure: Complete revascularization of occluded right middle cerebral M1 segment with contact aspiration and stent retrieval performed. Reocclusion due to severe underlying intracranial arteriosclerosis in the distal right M1 segment identified; this led to stent assisted angioplasty of the distal right middle cerebral artery M1 segment with complete revascularization. See procedure notes for further.  Subjective: Patient is reclined in his bedside chair. He is calm, conversant, and interested in his care. Denies any complaint apart from ongoing shoulder pain from previous injury and continued L sensation altered. States that he has regained use of his L hand. Nursing documented NIH last documented 2 at 0700.   Allergies: Succinylcholine and Pregabalin   Medications: Prior to Admission medications   Medication Sig Start Date End Date Taking? Authorizing Provider  albuterol  (PROVENTIL  HFA;VENTOLIN  HFA) 108 (90 Base) MCG/ACT inhaler Inhale 2 puffs into the lungs every 4 (four) hours as needed for wheezing or shortness of breath. 03/25/17   Nestor, Jennings E, MD  amLODipine  (NORVASC ) 10 MG tablet Take 10 mg by mouth  daily in the afternoon.    [provider]  aspirin  EC 81 MG tablet Take 81 mg by mouth daily. Swallow whole.    [provider]  clopidogrel  (PLAVIX ) 75 MG tablet Take 1 tablet by mouth daily.    [provider]  gabapentin  (NEURONTIN ) 800 MG tablet Take 1 tablet (800 mg total) by mouth 3 (three) times daily. Patient taking differently: Take 800 mg by mouth in the morning, at noon, in the evening, and at bedtime. 08/01/23   Gonfa, Taye T, MD  HYDROmorphone  (DILAUDID ) 2 MG tablet Take 2 mg by mouth every 4 (four) hours.    [provider]  methocarbamol  (ROBAXIN ) 500 MG tablet Take 1 tablet by mouth 4 (four) times daily.    [provider]  metoprolol  succinate (TOPROL -XL) 25 MG 24 hr tablet Take 1 tablet by mouth daily.    [provider]  ondansetron  (ZOFRAN ) 4 MG tablet Take 4 mg by mouth every 8 (eight) hours as needed for refractory nausea / vomiting, vomiting or nausea. 10/07/23   [provider]  oxyCODONE -acetaminophen  (PERCOCET) 10-325 MG tablet Take 1 tablet by mouth in the morning, at noon, in the evening, and at bedtime. 03/05/18   [provider]  rosuvastatin  (CRESTOR ) 20 MG tablet Take 1 tablet (20 mg total) by mouth daily. Patient taking differently: Take 20 mg by mouth every evening. 09/26/23 12/25/23  Cassandra Cleveland, MD     Vital Signs: BP (!) 143/73 Comment: going per cuff, pt experiencing significant pain  Pulse (!) 51   Temp 98 F (36.7 C) (Oral)   Resp 19   Ht 5\' 9"  (1.753 m)   Wt 195 lb 0.3 oz (  88.5 kg)   SpO2 98%   BMI 28.80 kg/m   Physical Exam Cardiovascular:     Comments: Site of R groin arterial puncture is soft, mildly tender with dressing C/D/I. L radial puncture dressing is C/D/I. Pulmonary:     Effort: Pulmonary effort is normal.  Skin:    General: Skin is warm and dry.   Alert, aware and oriented X 3 Speech and comprehension is intact.  PERRL bilaterally No facial droop  noted Tongue midline  Can spontaneously move all 4 extremities. Hand grip strength equal bilaterally. Fine motor and coordination intact.  Distal pulses (DP's) palpable bilaterally with Doppler Speech, cognition and language  are generally intact.  Comprehension and fluency are normal.  Judgment and insight normal  Negative pronator drift. Gait not assessed Romberg not assessed Heel to toe not assessed Distal pulses not assessed  Imaging: MR BRAIN WO CONTRAST Result Date: 11/10/2023 CLINICAL DATA:  Stroke, follow up EXAM: MRI HEAD WITHOUT CONTRAST TECHNIQUE: Multiplanar, multiecho pulse sequences of the brain and surrounding structures were obtained without intravenous contrast. COMPARISON:  CT head Nov 09, 2023. FINDINGS: Brain: Acute or early subacute infarcts in the right basal ganglia and overlying right frontal lobe. Associated edema without significant mass effect. No midline shift. Remote posterior right temporal/occipital infarct. Additional mild scattered T2/FLAIR hyperintensities the white matter, compatible with chronic microvascular ischemic disease. No evidence of acute hemorrhage, mass lesion, or hydrocephalus. Vascular: Major arterial flow voids are maintained at the skull base. Skull and upper cervical spine: Normal marrow signal. Sinuses/Orbits: Clear sinuses.  No acute orbital findings. IMPRESSION: 1. Acute or early subacute infarcts in the right basal ganglia and overlying right frontal lobe. 2. Remote right temporal/occipital infarct. Electronically Signed   By: Stevenson Elbe M.D.   On: 11/10/2023 00:44   CT HEAD W & WO CONTRAST ( ) Result Date: 11/09/2023 CLINICAL DATA:  TIA. Status post revascularization of distal right M1 segment. Fluid assisted stent angioplasty was utilized. Contrast staining in the posterior right putamen following procedure. EXAM: CT HEAD WITHOUT AND WITH CONTRAST TECHNIQUE: Contiguous axial images were obtained from the base of the skull through the  vertex without and with intravenous contrast. RADIATION DOSE REDUCTION: This exam was performed according to the departmental dose-optimization program which includes automated exposure control, adjustment of the mA and/or kV according to patient size and/or use of iterative reconstruction technique. CONTRAST:  75mL OMNIPAQUE  IOHEXOL  350 MG/ML SOLN COMPARISON:  CT head without contrast 11/09/2023 at 10:59 a.m. FINDINGS: Brain: Remote infarct of the posterior right temporal and occipital lobe is again noted. Mild enhancement is present within the right lentiform nucleus. Hemorrhage is present. No other foci of enhancement are present. Other scattered foci white matter hypoattenuation bilaterally are stable. Ventricles are of normal size. No significant extraaxial fluid collection is present. The brainstem and cerebellum are within normal limits. Midline structures are within normal limits. Vascular: No hyperdense vessel or unexpected calcification. Visible vessels are patent. Stent is now no noted within the distal right M1 segment. Right MCA branch vessels opacify postcontrast. Skull: Calvarium is intact. No focal lytic or blastic lesions are present. No significant extracranial soft tissue lesion is present. Sinuses/Orbits: The paranasal sinuses and mastoid air cells are clear. The globes and orbits are within normal limits. IMPRESSION: 1. Mild enhancement within the right lentiform nucleus compatible may represent subacute infarct. 2. Remote infarct of the posterior right temporal and occipital lobe is again noted. 3. Stent is now no noted within the distal right M1  segment. Right MCA branch vessels opacify postcontrast. 4. Stable mild white matter disease. This likely reflects the sequela of chronic microvascular ischemia. Electronically Signed   By: Audree Leas M.D.   On: 11/09/2023 18:36   CT ANGIO HEAD NECK W WO CM W PERF (CODE STROKE) Result Date: 11/09/2023 CLINICAL DATA:  Headaches with  left-sided deficits for 2 days. EXAM: CT ANGIOGRAPHY HEAD AND NECK CT PERFUSION BRAIN TECHNIQUE: Multidetector CT imaging of the head and neck was performed using the standard protocol during bolus administration of intravenous contrast. Multiplanar CT image reconstructions and MIPs were obtained to evaluate the vascular anatomy. Carotid stenosis measurements (when applicable) are obtained utilizing NASCET criteria, using the distal internal carotid diameter as the denominator. Multiphase CT imaging of the brain was performed following IV bolus contrast injection. Subsequent parametric perfusion maps were calculated using RAPID software. RADIATION DOSE REDUCTION: This exam was performed according to the departmental dose-optimization program which includes automated exposure control, adjustment of the mA and/or kV according to patient size and/or use of iterative reconstruction technique. CONTRAST:  OMNIPAQUE  IOHEXOL  350 MG/ML SOLN COMPARISON:  CT head without contrast 11/09/2023. MR head without and with contrast and MR angio head 07/31/2023. FINDINGS: CTA NECK FINDINGS Aortic arch: Common origin of the left common carotid artery and the innominate artery is noted. Atherosclerotic calcifications are present distal arch. Atherosclerotic changes are present the proximal left subclavian artery. Lumen is narrowed to 2 mm. This compares to more distal vessel of 8 mm. Right carotid system: Right common carotid artery is within normal limits. Bifurcation is unremarkable. Calcifications are present the proximal right ICA without significant stenosis. Moderate tortuosity is present in the distal cervical right ICA without significant stenosis. Left carotid system: Left common carotid artery is within normal limits. Minimal calcifications are present the bifurcation without significant stenosis. Moderate tortuosity is present in the mid cervical left ICA without focal stenosis. Vertebral arteries: Left vertebral artery  is the dominant vessel. Both vertebral arteries originate from the subclavian arteries without significant stenosis. No significant stenosis is present in either vertebral artery in the neck. Skeleton: Vertebral body heights alignment are normal. Endplate changes and uncovertebral spurring most evident at C3-4 and C4-5, left greater than right. The patient is edentulous. No focal osseous lesions are present. Other neck: The soft tissues of the neck are unremarkable. Salivary glands are within normal limits. Thyroid  is normal. No significant adenopathy is present. No focal mucosal or submucosal lesions are present. Upper chest: The lung apices are clear. The thoracic inlet is within normal limits. Review of the MIP images confirms the above findings CTA HEAD FINDINGS Anterior circulation: Minimal atherosclerotic changes are present within the cavernous internal carotid arteries bilaterally without significant stenosis through the ICA termini. The A1 normal bilaterally. The anterior communicating artery is patent. The left M1 segment is normal. The distal right M 1 segment is occluded. Distal right MCA branch vessels are reconstituted via collaterals, less robust than on the left. Mild attenuation of distal ACA branch vessels and distal left MCA branch vessels. No significant proximal stenosis or occlusion. No aneurysm. Posterior circulation: The PICA origins are visualized and normal. Vertebrobasilar junction and basilar artery normal. Dominant left AICA vessel is present. The superior cerebellar arteries are patent bilaterally. Both posterior cerebral arteries originate from basilar tip. PCA branch vessels are normal bilaterally. No aneurysm is present. Venous sinuses: The dural sinuses are patent. The straight sinus and deep cerebral veins are intact. Cortical veins are within normal limits. No  significant vascular malformation is evident. Anatomic variants: None Review of the MIP images confirms the above findings  CT Brain Perfusion Findings: ASPECTS: 10/10 CBF (<30%) Volume: 0mL Perfusion (Tmax>6.0s) volume: 0mL Mismatch Volume: 0mL Of note, T-max asymmetry is noted in the right MCA distribution at 4.0 seconds. This suggests there is a subtle perfusion asymmetry. IMPRESSION: 1. Occlusion of the distal right M1 segment. 2. Distal right MCA branch vessels are reconstituted via collaterals, less robust than on the left. 3. Mild attenuation of distal ACA branch vessels and distal left MCA branch vessels without significant proximal stenosis or occlusion. 4. Minimal atherosclerotic changes at the carotid bifurcations and cavernous internal carotid arteries bilaterally without significant stenosis. 5. Moderate tortuosity of the mid cervical internal carotid arteries bilaterally without focal stenosis. 6. Atherosclerotic changes at the proximal left subclavian artery with luminal narrowing to 2 mm. This compares to more distal vessel of 8 mm. 7. Normal CT perfusion using standard criteria. The above was relayed via text pager to Dr. Baldwin Levee on 11/09/2023 at 11:24 . Electronically Signed   By: Audree Leas M.D.   On: 11/09/2023 11:40   CT HEAD CODE STROKE WO CONTRAST Result Date: 11/09/2023 CLINICAL DATA:  Code stroke. Headache with left-sided deficits for 2 days. EXAM: CT HEAD WITHOUT CONTRAST TECHNIQUE: Contiguous axial images were obtained from the base of the skull through the vertex without intravenous contrast. RADIATION DOSE REDUCTION: This exam was performed according to the departmental dose-optimization program which includes automated exposure control, adjustment of the mA and/or kV according to patient size and/or use of iterative reconstruction technique. COMPARISON:  CT head without contrast and MR head without and with contrast 07/31/2023 FINDINGS: Brain: Expected evolution of the previously seen right occipital lobe infarct is noted. No new or acute infarcts are evident. Focal hypoattenuation along the  anterior margin of the left lentiform nucleus is stable. Deep gray nuclei are otherwise within normal limits. The ventricles are of normal size. No significant white matter lesions are present. No significant extraaxial fluid collection is present. The brainstem and cerebellum are within normal limits. Midline structures are within normal limits. Vascular: No hyperdense vessel or unexpected calcification. Skull: Calvarium is intact. No focal lytic or blastic lesions are present. No significant extracranial soft tissue lesion is present. Sinuses/Orbits: The paranasal sinuses and mastoid air cells are clear. The globes and orbits are within normal limits. ASPECTS [facility] Stroke Program Early CT Score) - Ganglionic level infarction (caudate, lentiform nuclei, internal capsule, insula, M1-M3 cortex): 7/7 - Supraganglionic infarction (M4-M6 cortex): 3/3 Total score (0-10 with 10 being normal): 10/10 IMPRESSION: 1. No acute intracranial abnormality or significant interval change. 2. Expected evolution of the previously seen right occipital lobe infarct. 3. Stable focal hypoattenuation along the anterior margin of the left lentiform nucleus. This likely represents a small chronic lacunar infarct. 4. Aspects is 10/10. The above was relayed via text pager to Dr. Baldwin Levee on 11/09/2023 at 11:06 . Electronically Signed   By: Audree Leas M.D.   On: 11/09/2023 11:06    Labs:  CBC: Recent Labs    07/31/23 2054 08/01/23 0407 11/06/23 1420 11/09/23 1049 11/09/23 1055 11/10/23 0652  WBC 8.6 8.5 10.2  --   --  10.0  HGB 16.7 15.0 15.6 16.0 15.6 12.6*  HCT 50.1 44.8 48.7 47.0 46.0 38.6*  PLT 360 324 330  --   --  291    COAGS: No results for input(s): "INR", "APTT" in the last 8760 hours.  BMP:  Recent Labs    07/31/23 2054 08/01/23 0407 11/06/23 1420 11/09/23 1049 11/09/23 1055 11/10/23 0652  NA 136 136 137 141 141 141  K 4.0 3.2* 4.3 3.9 3.8 3.4*  CL 95* 100 103 104 105 110  CO2 27 27  28   --   --  21*  GLUCOSE 71 91 90 101* 101* 110*  BUN 9 13 16 7 6 7   CALCIUM  9.7 8.9 9.1  --   --  8.4*  CREATININE 1.02 0.88 1.06 0.90 1.00 0.97  GFRNONAA >60 >60 >60  --   --  >60    LIVER FUNCTION TESTS: Recent Labs    08/01/23 0407  BILITOT 0.3  AST 14*  ALT 16  ALKPHOS 72  PROT 6.4*  ALBUMIN 3.8    Assessment and Plan:  S/p common carotid arteriogram with stent assisted angioplasty Neurologic deficits have improved from presentation after vascularization. No concerns identified with exam of R groin at puncture location.  Discussed plan with patient which will include the following: -2 week follow up with Dr. Alvira Josephs -continue ASA and brilinta for at least 2 months at which point may consider changing brilinta to plavix  -smoking cessation -stay well hydration  -No bending or stooping for 2 weeks. No pushing or pulling greater than 10 pounds for 2 weeks. No driving for 2 weeks.   Patient to be seen in [facility]. Follow up order placed. IR scheduler will call with appoinment date and time. Please call [phone] with any questions or concerns   Electronically Signed: Terressa Fess, NP 11/10/2023, 8:45 AM   I spent a total of 15 Minutes at the the patient's bedside AND on the patient's hospital floor or unit, greater than 50% of which was counseling/coordinating care for S/p common carotid arteriogram with stent assisted angioplasty

## 2023-11-10 NOTE — Plan of Care (Signed)
 Patient with vary minimal deficits, slight facial droop and visual field cut. Otherwise totally capable mobilizing. All discharge teaching reviewed with patient and wife. All questions answered.

## 2023-11-10 NOTE — Progress Notes (Signed)
 STROKE TEAM PROGRESS NOTE   BRIEF HPI Mr. BLONG PIKE is a 58 y.o. male with history of anemia, anxiety, arthritis, CAD, COPD, HTN, GERD, OSA not on CPAP opiate use, peripheral neuropathy, remote CVA, and B12 deficiency presenting initially to OSH for fluctuating left hemiparesis, left facial droop, left-sided sensory loss and dysarthria.  Imaging at OSH revealed right M1 occlusion and patient was transferred to [facility] for IR thrombectomy.  SIGNIFICANT HOSPITAL EVENTS 5/4 presented to OSH for left arm weakness and leg weakness.  Imaging revealed right M1 occlusion.  Transferred to [facility] for IR thrombectomy.  INTERIM HISTORY/SUBJECTIVE Patient in bedside recliner, no family is present during evaluation.  Some concern for bradycardia and sleep apnea pattern of breathing with PVCs, heart rate briefly dipping to the 30s during sleep.  Has OSA diagnosis from 2014, untreated, CPAP at bedside though was not used overnight.  Discussed sleep smart clinical trial with patient.  Pamphlet provided.  Will discuss decision regarding trial with patient this afternoon.  Further history obtained, patient seen by outpatient neurologist Dr. Samara Crest for surgical clearance.  Patient advised to wait 6 months post-stroke for surgery though patient stated he could not wait secondary to shoulder pain.  Advised that he would have to sign a waiver for surgery.  Patient stopped antiplatelet therapy in preparation for elective surgery 5/14.  OBJECTIVE CBC    Component Value Date/Time   WBC 10.0 11/10/2023 0652   RBC 3.99 (L) 11/10/2023 0652   HGB 12.6 (L) 11/10/2023 0652   HCT 38.6 (L) 11/10/2023 0652   PLT 291 11/10/2023 0652   MCV 96.7 11/10/2023 0652   MCH 31.6 11/10/2023 0652   MCHC 32.6 11/10/2023 0652   RDW 13.0 11/10/2023 0652   LYMPHSABS 2.3 11/10/2023 0652   MONOABS 0.6 11/10/2023 0652   EOSABS 0.0 11/10/2023 0652   BASOSABS 0.0 11/10/2023 0652   BMET    Component Value Date/Time   NA 141 11/09/2023 1055    NA 139 12/21/2015 1404   K 3.8 11/09/2023 1055   CL 105 11/09/2023 1055   CO2 28 11/06/2023 1420   GLUCOSE 101 (H) 11/09/2023 1055   BUN 6 11/09/2023 1055   BUN 7 12/21/2015 1404   CREATININE 1.00 11/09/2023 1055   CALCIUM  9.1 11/06/2023 1420   GFRNONAA >60 11/06/2023 1420   Lab Results  Component Value Date   HGBA1C 5.5 07/31/2023   Lab Results  Component Value Date   CHOL 137 11/10/2023   HDL 29 (L) 11/10/2023   LDLCALC 83 11/10/2023   TRIG 123 11/10/2023   CHOLHDL 4.7 11/10/2023   Drugs of Abuse     Component Value Date/Time   LABOPIA POSITIVE (A) 08/01/2023 0737   COCAINSCRNUR NONE DETECTED 08/01/2023 0737   LABBENZ NONE DETECTED 08/01/2023 0737   AMPHETMU POSITIVE (A) 08/01/2023 0737   THCU NONE DETECTED 08/01/2023 0737   LABBARB NONE DETECTED 08/01/2023 0737    IMAGING past 24 hours MR BRAIN WO CONTRAST Result Date: 11/10/2023 CLINICAL DATA:  Stroke, follow up EXAM: MRI HEAD WITHOUT CONTRAST TECHNIQUE: Multiplanar, multiecho pulse sequences of the brain and surrounding structures were obtained without intravenous contrast. COMPARISON:  CT head Nov 09, 2023. FINDINGS: Brain: Acute or early subacute infarcts in the right basal ganglia and overlying right frontal lobe. Associated edema without significant mass effect. No midline shift. Remote posterior right temporal/occipital infarct. Additional mild scattered T2/FLAIR hyperintensities the white matter, compatible with chronic microvascular ischemic disease. No evidence of acute hemorrhage, mass lesion, or hydrocephalus.  Vascular: Major arterial flow voids are maintained at the skull base. Skull and upper cervical spine: Normal marrow signal. Sinuses/Orbits: Clear sinuses.  No acute orbital findings. IMPRESSION: 1. Acute or early subacute infarcts in the right basal ganglia and overlying right frontal lobe. 2. Remote right temporal/occipital infarct. Electronically Signed   By: Stevenson Elbe M.D.   On: 11/10/2023 00:44    CT HEAD W & WO CONTRAST ( ) Result Date: 11/09/2023 CLINICAL DATA:  TIA. Status post revascularization of distal right M1 segment. Fluid assisted stent angioplasty was utilized. Contrast staining in the posterior right putamen following procedure. EXAM: CT HEAD WITHOUT AND WITH CONTRAST TECHNIQUE: Contiguous axial images were obtained from the base of the skull through the vertex without and with intravenous contrast. RADIATION DOSE REDUCTION: This exam was performed according to the departmental dose-optimization program which includes automated exposure control, adjustment of the mA and/or kV according to patient size and/or use of iterative reconstruction technique. CONTRAST:  75mL OMNIPAQUE  IOHEXOL  350 MG/ML SOLN COMPARISON:  CT head without contrast 11/09/2023 at 10:59 a.m. FINDINGS: Brain: Remote infarct of the posterior right temporal and occipital lobe is again noted. Mild enhancement is present within the right lentiform nucleus. Hemorrhage is present. No other foci of enhancement are present. Other scattered foci white matter hypoattenuation bilaterally are stable. Ventricles are of normal size. No significant extraaxial fluid collection is present. The brainstem and cerebellum are within normal limits. Midline structures are within normal limits. Vascular: No hyperdense vessel or unexpected calcification. Visible vessels are patent. Stent is now no noted within the distal right M1 segment. Right MCA branch vessels opacify postcontrast. Skull: Calvarium is intact. No focal lytic or blastic lesions are present. No significant extracranial soft tissue lesion is present. Sinuses/Orbits: The paranasal sinuses and mastoid air cells are clear. The globes and orbits are within normal limits. IMPRESSION: 1. Mild enhancement within the right lentiform nucleus compatible may represent subacute infarct. 2. Remote infarct of the posterior right temporal and occipital lobe is again noted. 3. Stent is now no noted  within the distal right M1 segment. Right MCA branch vessels opacify postcontrast. 4. Stable mild white matter disease. This likely reflects the sequela of chronic microvascular ischemia. Electronically Signed   By: Audree Leas M.D.   On: 11/09/2023 18:36   CT ANGIO HEAD NECK W WO CM W PERF (CODE STROKE) Result Date: 11/09/2023 CLINICAL DATA:  Headaches with left-sided deficits for 2 days. EXAM: CT ANGIOGRAPHY HEAD AND NECK CT PERFUSION BRAIN TECHNIQUE: Multidetector CT imaging of the head and neck was performed using the standard protocol during bolus administration of intravenous contrast. Multiplanar CT image reconstructions and MIPs were obtained to evaluate the vascular anatomy. Carotid stenosis measurements (when applicable) are obtained utilizing NASCET criteria, using the distal internal carotid diameter as the denominator. Multiphase CT imaging of the brain was performed following IV bolus contrast injection. Subsequent parametric perfusion maps were calculated using RAPID software. RADIATION DOSE REDUCTION: This exam was performed according to the departmental dose-optimization program which includes automated exposure control, adjustment of the mA and/or kV according to patient size and/or use of iterative reconstruction technique. CONTRAST:  OMNIPAQUE  IOHEXOL  350 MG/ML SOLN COMPARISON:  CT head without contrast 11/09/2023. MR head without and with contrast and MR angio head 07/31/2023. FINDINGS: CTA NECK FINDINGS Aortic arch: Common origin of the left common carotid artery and the innominate artery is noted. Atherosclerotic calcifications are present distal arch. Atherosclerotic changes are present the proximal left subclavian artery. Lumen is  narrowed to 2 mm. This compares to more distal vessel of 8 mm. Right carotid system: Right common carotid artery is within normal limits. Bifurcation is unremarkable. Calcifications are present the proximal right ICA without significant stenosis.  Moderate tortuosity is present in the distal cervical right ICA without significant stenosis. Left carotid system: Left common carotid artery is within normal limits. Minimal calcifications are present the bifurcation without significant stenosis. Moderate tortuosity is present in the mid cervical left ICA without focal stenosis. Vertebral arteries: Left vertebral artery is the dominant vessel. Both vertebral arteries originate from the subclavian arteries without significant stenosis. No significant stenosis is present in either vertebral artery in the neck. Skeleton: Vertebral body heights alignment are normal. Endplate changes and uncovertebral spurring most evident at C3-4 and C4-5, left greater than right. The patient is edentulous. No focal osseous lesions are present. Other neck: The soft tissues of the neck are unremarkable. Salivary glands are within normal limits. Thyroid  is normal. No significant adenopathy is present. No focal mucosal or submucosal lesions are present. Upper chest: The lung apices are clear. The thoracic inlet is within normal limits. Review of the MIP images confirms the above findings CTA HEAD FINDINGS Anterior circulation: Minimal atherosclerotic changes are present within the cavernous internal carotid arteries bilaterally without significant stenosis through the ICA termini. The A1 normal bilaterally. The anterior communicating artery is patent. The left M1 segment is normal. The distal right M 1 segment is occluded. Distal right MCA branch vessels are reconstituted via collaterals, less robust than on the left. Mild attenuation of distal ACA branch vessels and distal left MCA branch vessels. No significant proximal stenosis or occlusion. No aneurysm. Posterior circulation: The PICA origins are visualized and normal. Vertebrobasilar junction and basilar artery normal. Dominant left AICA vessel is present. The superior cerebellar arteries are patent bilaterally. Both posterior  cerebral arteries originate from basilar tip. PCA branch vessels are normal bilaterally. No aneurysm is present. Venous sinuses: The dural sinuses are patent. The straight sinus and deep cerebral veins are intact. Cortical veins are within normal limits. No significant vascular malformation is evident. Anatomic variants: None Review of the MIP images confirms the above findings CT Brain Perfusion Findings: ASPECTS: 10/10 CBF (<30%) Volume: 0mL Perfusion (Tmax>6.0s) volume: 0mL Mismatch Volume: 0mL Of note, T-max asymmetry is noted in the right MCA distribution at 4.0 seconds. This suggests there is a subtle perfusion asymmetry. IMPRESSION: 1. Occlusion of the distal right M1 segment. 2. Distal right MCA branch vessels are reconstituted via collaterals, less robust than on the left. 3. Mild attenuation of distal ACA branch vessels and distal left MCA branch vessels without significant proximal stenosis or occlusion. 4. Minimal atherosclerotic changes at the carotid bifurcations and cavernous internal carotid arteries bilaterally without significant stenosis. 5. Moderate tortuosity of the mid cervical internal carotid arteries bilaterally without focal stenosis. 6. Atherosclerotic changes at the proximal left subclavian artery with luminal narrowing to 2 mm. This compares to more distal vessel of 8 mm. 7. Normal CT perfusion using standard criteria. The above was relayed via text pager to Dr. Baldwin Levee on 11/09/2023 at 11:24 . Electronically Signed   By: Audree Leas M.D.   On: 11/09/2023 11:40   CT HEAD CODE STROKE WO CONTRAST Result Date: 11/09/2023 CLINICAL DATA:  Code stroke. Headache with left-sided deficits for 2 days. EXAM: CT HEAD WITHOUT CONTRAST TECHNIQUE: Contiguous axial images were obtained from the base of the skull through the vertex without intravenous contrast. RADIATION DOSE REDUCTION: This  exam was performed according to the departmental dose-optimization program which includes  automated exposure control, adjustment of the mA and/or kV according to patient size and/or use of iterative reconstruction technique. COMPARISON:  CT head without contrast and MR head without and with contrast 07/31/2023 FINDINGS: Brain: Expected evolution of the previously seen right occipital lobe infarct is noted. No new or acute infarcts are evident. Focal hypoattenuation along the anterior margin of the left lentiform nucleus is stable. Deep gray nuclei are otherwise within normal limits. The ventricles are of normal size. No significant white matter lesions are present. No significant extraaxial fluid collection is present. The brainstem and cerebellum are within normal limits. Midline structures are within normal limits. Vascular: No hyperdense vessel or unexpected calcification. Skull: Calvarium is intact. No focal lytic or blastic lesions are present. No significant extracranial soft tissue lesion is present. Sinuses/Orbits: The paranasal sinuses and mastoid air cells are clear. The globes and orbits are within normal limits. ASPECTS [facility] Stroke Program Early CT Score) - Ganglionic level infarction (caudate, lentiform nuclei, internal capsule, insula, M1-M3 cortex): 7/7 - Supraganglionic infarction (M4-M6 cortex): 3/3 Total score (0-10 with 10 being normal): 10/10 IMPRESSION: 1. No acute intracranial abnormality or significant interval change. 2. Expected evolution of the previously seen right occipital lobe infarct. 3. Stable focal hypoattenuation along the anterior margin of the left lentiform nucleus. This likely represents a small chronic lacunar infarct. 4. Aspects is 10/10. The above was relayed via text pager to Dr. Baldwin Levee on 11/09/2023 at 11:06 . Electronically Signed   By: Audree Leas M.D.   On: 11/09/2023 11:06   Vitals:   11/10/23 0400 11/10/23 0500 11/10/23 0600 11/10/23 0700  BP: 97/73 114/71 130/79 (!) 143/73  Pulse: (!) 44 (!) 51 (!) 57 (!) 51  Resp: 15 14 20 19    Temp: 98.7 F (37.1 C)     TempSrc: Oral     SpO2: 95% 96% 97% 98%  Weight:      Height:       PHYSICAL EXAM General:  Alert, well-nourished, well-developed patient in no acute distress Psych:  Mood and affect appropriate for situation, calm and cooperative with exam CV: Sinus bradycardia on telemetry Respiratory:  Regular, unlabored respirations on room air GI: Abdomen soft and nontender  NEURO:  Mental Status: AA&Ox3, patient is able to give clear and coherent history Speech/Language: speech is without dysarthria or aphasia.  Naming, repetition, fluency, and comprehension intact.  Cranial Nerves:  II: PERRL. Partial left visual field cut III, IV, VI: EOMI. Eyelids elevate symmetrically.  V: Sensation is intact to light touch and symmetrical to face.  VII: Mild left mouth droop. VIII: Hearing intact to voice. IX, X: Phonation intact XI: Shoulder shrug 5/5. XII: Tongue protrudes midline Motor: 5/5 strength to all muscle groups tested.  Some limitation of right upper extremity confrontational strength testing due to chronic right shoulder pain with planned surgical operation in the future. Tone: is normal and bulk is normal Sensation: Intact to light touch bilaterally. Extinction absent to light touch to DSS.   Coordination: FTN intact bilaterally, HKS: no ataxia in BLE.No drift.  Gait: Deferred  ASSESSMENT/PLAN Acute Ischemic Infarct: Acute infarct in the right basal ganglia and overlying right frontal lobe secondary to right M1 MCA occlusion s/p complete TICI 2 C revascularization with reocclusion due to severe underlying intracranial arteriosclerosis s/p stent-assisted angioplasty and TICI 3 revascularization.  Etiology:  Severe intracranial arteriosclerosis  CT head: No acute intracranial abnormality or significant interval change.  Expected evolution of previously seen right occipital lobe infarct.  Stable focal hypoattenuation along the anterior margin of the left lentiform  nucleus.  This likely represents a small chronic lacunar infarct. aspects is 10. CTA head & neck: Occlusion of the distal right M1 segment.  Distal right MCA branch vessels are reconstituted via collaterals, less robust than on the left.  Mild attenuation of distal ACA branch vessels and distal left MCA branch vessels without significant proximal stenosis or occlusion.  Minimal atherosclerotic changes at the carotid bifurcations and cavernous internal carotid arteries bilaterally without significant stenosis.  Moderate tortuosity of the mid cervical internal carotid arteries bilaterally without focal stenosis.  Atherosclerotic changes of the proximal left subclavian artery with luminal narrowing to 2 mm.  This compares to more distal vessel of 8 mm. CT perfusion: Normal CT perfusion using standard criteria. Post IR CT: Mild enhancement within the right lentiform nucleus compatible may represent subacute infarct.  Remote infarct of the posterior right temporal and occipital lobe is again noted.  Stent is now noted within the distal right M1 segment.  Right MCA branch vessels opacify postcontrast.  Stable mild white matter disease.  This likely reflects the sequela of chronic microvascular ischemia. MRI brain: Acute or early subacute infarcts in the right basal ganglia and overlying right frontal lobe.  Remote right temporal/occipital infarct. 2D Echo EF 60 to 65%, mild LVH. LDL 62 HgbA1c 5.5 VTE prophylaxis - SCDs aspirin  81 mg daily and clopidogrel  75 mg daily prior to admission, now on aspirin  81 mg daily and Brilinta (ticagrelor) 90 mg bid for 6 months due to intracranial stent and then aspirin  alone. Therapy recommendations:  No follow up needed  Disposition:  Discharge to home  Hx of Stroke/TIA January 2025 MRI brain with evolving subacute ischemic infarct involving the right occipital lobe.  Associated petechial blood products without frank hemorrhagic transformation or significant regional mass  effect.  Additional subcentimeter early subacute ischemic infarct involving the right frontal centrum semiovale. MRA head with focal severe proximal right M2 stenosis, inferior division. Discharged on dual antiplatelet therapy for 3 months. Antiplatelet therapy recently held for planned right shoulder surgery on 11/19/2023. With dizziness, confusion, concern for seizure-like episodes as well.  EEG negative at this time.  Hypertension Home meds: Norvasc , Toprol  XL Stable Blood pressure goal 120-140 for 24 hours post-IR then < 160 mmHg Added Norvasc  10 mg, held Toprol  due to bradycardia PRN Hydralazine ordered for blood pressure control   Hyperlipidemia Home meds:  rosuvastatin  20 mg, resumed in hospital LDL 62, goal < 70 Continue statin at discharge  Other Stroke Risk Factors Coronary artery disease Obstructive sleep apnea, not on CPAP at home  Other Active Problems Chronic back and shoulder pain Resumed home gabapentin   Hypokalemia K 3.8->3.4  Hospital day # 1  Stevi Toberman, AGACNP-BC [facility] Pager: [phone] I have personally obtained history,examined this patient, reviewed notes, independently viewed imaging studies, participated in medical decision making and plan of care.ROS completed by me personally and pertinent positives fully documented  I have made any additions or clarifications directly to the above note. Agree with note above.  Patient presented with fluctuating left hemiparesis due to right M1 high-grade stenosis/occlusion and was transferred for emergent thrombectomy and requiring rescue MCA stent placement.  Patient has done well postprocedure with only mild facial droop peripheral visual field loss.  Mobilize out of bed.  Therapy consults.  Strict control of blood pressure as per postlumpectomy protocol.  Patient also appears to  be at risk for sleep apnea consider possible participation in sleep smart study if interested and was given written  information to review.  Discussed with Dr. Alvira Josephs patient and wife and answered questions.This patient is critically ill and at significant risk of neurological worsening, death and care requires constant monitoring of vital signs, hemodynamics,respiratory and cardiac monitoring, extensive review of multiple databases, frequent neurological assessment, discussion with family, other specialists and medical decision making of high complexity.I have made any additions or clarifications directly to the above note.This critical care time does not reflect procedure time, or teaching time or supervisory time of PA/NP/Med Resident etc but could involve care discussion time.  I spent 30 minutes of neurocritical care time  in the care of  this patient.      Ardella Beaver, MD Medical Director [facility] Stroke Center Pager: [phone] 11/10/2023 3:21 PM  To contact Stroke Continuity provider, please refer to [email or web address]. After hours, contact General Neurology

## 2023-11-10 NOTE — Plan of Care (Signed)
 Neurology on-call note  Events overnight  #Contacted by RN at some point around 3 4 crampy diarrhea-ordered Imodium  x 1.  #Then his A-line pressure was below goal.  Ordered a 1 L bolus.  After receiving 500 cc, A-line reading 150s but cuff around 130s.  Asked the RN to go back of pressure.  #RN also reports sleep apnea type pattern of breathing with PVCs and heart rate going down sometimes into the 30s when he goes into deep sleep for a very brief moment before returning to normal. Has OSA diagnosis from 2014 (not sure ever had CPAP). For tonight, recommended CPAP for now after curbside discussion with PCCM Elink provider.  If continues to have episodes of bradycardia, will formally consult cardiology.  Tona Francis, MD Neurology

## 2023-11-10 NOTE — Discharge Instructions (Addendum)
 Patient to be seen in [facility]. Follow up order placed. IR scheduler will call with appoinment date and time. Please call [phone] with any questions or concerns  No Bending or stooping for 2 weeks. No pushing or pulling greater than 10 pounds for 2 weeks. No driving for 2 weeks.  Continue Brilinta and aspirin . Stay well hydrated.

## 2023-11-10 NOTE — Evaluation (Signed)
 Speech Language Pathology Evaluation Patient Details Name: KARLIS COURTENAY MRN: [id number] DOB: 1965/08/14 Today's Date: 11/10/2023 Time: 8295-6213 SLP Time Calculation (min) (ACUTE ONLY): 30 min  Problem List:  Patient Active Problem List   Diagnosis Date Noted   Stroke (cerebrum) (HCC) 11/09/2023   Middle cerebral artery stenosis, right 11/09/2023   CVA (cerebral vascular accident) (HCC) 07/31/2023   Protrusion of lumbar intervertebral disc 10/11/2022   Lung nodule 09/08/2017   Cough 03/25/2017   COPD (chronic obstructive pulmonary disease) (HCC) 01/22/2017   Asbestos exposure 12/13/2016   Unstable angina (HCC)    Chest pain 08/25/2016   Neurological complaint 08/25/2016   Hyperlipidemia LDL goal <130 12/21/2015   Neuropathy, peripheral 06/19/2015   Substance induced mood disorder (HCC) 02/14/2012   Polysubstance dependence (HCC) 02/12/2012   Sleep difficulties 02/11/2012   Anxiety 01/27/2012   Essential hypertension 01/27/2012   Opiate dependence (HCC) 01/27/2012   Benzodiazepine dependence (HCC) 01/27/2012   Dental caries 01/27/2012   TOBACCO DEPENDENCE 09/04/2006   MIGRAINE, UNSPEC., W/O INTRACTABLE MIGRAINE 09/04/2006   Fibromyalgia 09/04/2006   Past Medical History:  Past Medical History:  Diagnosis Date   Anemia 06/18/2012   Anxiety    Arthritis    CAD (coronary artery disease)    Mild to moderate nonobstructive CAD at cardiac catheterization 2018   Complication of anesthesia    hard to wake up   COPD (chronic obstructive pulmonary disease) (HCC)    Essential hypertension    Fibromyalgia    GERD (gastroesophageal reflux disease)    History of kidney stones    Opiate use    Palpitations    Peripheral neuropathy    Pulmonary nodule    Stroke (HCC)    Vitamin B 12 deficiency    Past Surgical History:  Past Surgical History:  Procedure Laterality Date   COLONOSCOPY     found to have 1 polyp   ESOPHAGEAL DILATION  1993   LEFT HEART CATH AND CORONARY  ANGIOGRAPHY N/A 12/09/2016   Procedure: Left Heart Cath and Coronary Angiography;  Surgeon: Sammy Crisp, MD;  Location: [facility];  Service: Cardiovascular;  Laterality: N/A;   LUNG SURGERY Left    2018 upper lobe   TONSILLECTOMY     VIDEO ASSISTED THORACOSCOPY (VATS)/WEDGE RESECTION Left 09/08/2017   Procedure: VIDEO ASSISTED THORACOSCOPY (VATS)/WEDGE RESECTION;  Surgeon: Zelphia Higashi, MD;  Location: [facility] OR;  Service: Thoracic;  Laterality: Left;   HPI:  QUILLEN PON is a 58 y.o. male who presented from [facility] for emergent thrombectomy after developing acute onset of fluctuating left hemiparesis, left facial droop, left sided sensory loss and dysarthria. MRI 5/5: "Acute or early subacute infarcts in the right basal ganglia and overlying right frontal lobe."  Pt with a PMHx of anemia, anxiety, arhtritis, CAD, COPD, prior complication of anesthesia, HTN, fibromyalgia, kidney stones, GERD, opiate use, peripheral neuropathy, prior stroke, pulmonary nodule and B12 deficiency   Assessment / Plan / Recommendation Clinical Impression  Pt presents with very mild cognitive linguistic deficits.  He was assessed using the COGNISTAT (see below for additional information).  He performed within the average range on all subtests administered except for attention, calculations, and judgment.  Attention score does not appear to represent a true impairment as he was able to attend to more complex tasks than the digit span recall task without any cuing.  He endorses that calculations task was more difficult than it normally would have been. He says he feels he is having  to think harder.  He exhibited mild impairment on judgment task.  Pt and family endorse he is more or less at his baseline.  Visuospatial construction was assessed using a clock drawing which was 100% accurate with numbers placed in clockwise fashion.  Discussed ST to address slowed processing times and calculations.  Pt politely declines  speech therapy at this time.  He can like expect continued recovery with time.  Counseled pt to seek OP referral if he feels deficits have not resolved.  Pt has no further ST needs.  SLP will sign off at this time.  Pt exhibited no deficits with word finding he is able to participate in conversation without hesitations or difficulty.  He could discuss medications at a high level with nursing. He uses humor appropriately.  Pt's speech is clear and without dysarthria.  He is 100% intelligible in conversation with no articulatory imprecision observed.  COGNISTAT: All subtests are within the average range, except where otherwise specified.  Orientation:  10/12 Attention: 5/8, mild impairent Comprehension: 6/6 Repetition: 11/12 Naming: 7/8 Construction: clock drawing Memory: 10/12 Calculations: 1/4, Moderate impairment Similarities: 7/8 Judgment: 3/6, mild impairment      SLP Assessment  SLP Recommendation/Assessment: Patient does not need any further Speech Lanaguage Pathology Services SLP Visit Diagnosis: Cognitive communication deficit (R41.841)    Recommendations for follow up therapy are one component of a multi-disciplinary discharge planning process, led by the attending physician.  Recommendations may be updated based on patient status, additional functional criteria and insurance authorization.    Follow Up Recommendations   (None at present.  Consider OP referral if deficits persist after d/c)    Assistance Recommended at Discharge   N/A  Functional Status Assessment Patient has not had a recent decline in their functional status  Frequency and Duration  N/A         SLP Evaluation Cognition  Overall Cognitive Status: Within Functional Limits for tasks assessed Arousal/Alertness: Awake/alert Orientation Level: Oriented X4 Year: 2025 Month: May Day of Week: Correct Attention: Focused;Sustained Focused Attention: Appears intact Sustained Attention: Appears  intact Memory: Appears intact Awareness: Appears intact Problem Solving: Appears intact Executive Function: Reasoning Reasoning: Appears intact       Comprehension  Auditory Comprehension Commands: Within Functional Limits Conversation: Complex Visual Recognition/Discrimination Discrimination: Not tested Reading Comprehension Reading Status: Not tested    Expression Expression Primary Mode of Expression: Verbal Verbal Expression Overall Verbal Expression: Appears within functional limits for tasks assessed Initiation: No impairment Level of Generative/Spontaneous Verbalization: Conversation Naming: No impairment Pragmatics: No impairment Written Expression Dominant Hand: Right Written Expression: Not tested   Oral / Sales executive Speech Overall Motor Speech: Appears within functional limits for tasks assessed Respiration: Within functional limits Phonation: Normal Resonance: Within functional limits Articulation: Within functional limitis Intelligibility: Intelligible Motor Planning: Witnin functional limits Motor Speech Errors: Not applicable            Elester Grim, MA, CCC-SLP Acute Rehabilitation Services Office: [phone] 11/10/2023, 9:54 AM

## 2023-11-10 NOTE — Discharge Summary (Signed)
 Stroke Discharge Summary  Patient ID: Mathew Brady   MRN: [id number]      DOB: 1966/03/23  Date of Admission: 11/09/2023 Date of Discharge: 11/10/2023  Attending Physician:  Stroke MD Consultant(s):    None  Patient's PCP:  Omie Bickers, MD  DISCHARGE PRIMARY DIAGNOSIS: Right MCA territory acute ischemic infarct 2/2 right M1 MCA occlusion s/p thrombectomy and rescue stent placement following reocclusion with TICI 3 revascularization.    Secondary Diagnoses: Remote CVA in January 2025, right occipital lobe infarct Hyperlipidemia Essential hypertension Coronary artery disease OSA not on CPAP Chronic back and shoulder pain  Allergies as of 11/10/2023       Reactions   Succinylcholine Anaphylaxis   Pregabalin  Other (See Comments), Photosensitivity        Medication List     STOP taking these medications    aspirin  EC 81 MG tablet Replaced by: aspirin  81 MG chewable tablet   clopidogrel  75 MG tablet Commonly known as: PLAVIX    metoprolol  succinate 25 MG 24 hr tablet Commonly known as: TOPROL -XL       TAKE these medications    albuterol  108 (90 Base) MCG/ACT inhaler Commonly known as: VENTOLIN  HFA Inhale 2 puffs into the lungs every 4 (four) hours as needed for wheezing or shortness of breath.   ALPRAZolam  0.5 MG tablet Commonly known as: XANAX  Take 0.5 mg by mouth at bedtime as needed for anxiety.   amLODipine  10 MG tablet Commonly known as: NORVASC  Take 10 mg by mouth daily in the afternoon.   aspirin  81 MG chewable tablet Chew 1 tablet (81 mg total) by mouth daily. Start taking on: Nov 11, 2023 Replaces: aspirin  EC 81 MG tablet   gabapentin  800 MG tablet Commonly known as: NEURONTIN  Take 1 tablet (800 mg total) by mouth 3 (three) times daily. What changed: when to take this   HYDROmorphone  2 MG tablet Commonly known as: DILAUDID  Take 2 mg by mouth every 4 (four) hours.   methocarbamol  500 MG tablet Commonly known as: ROBAXIN  Take 500 mg by mouth at  bedtime.   ondansetron  4 MG tablet Commonly known as: ZOFRAN  Take 4 mg by mouth every 8 (eight) hours as needed for refractory nausea / vomiting, vomiting or nausea.   oxyCODONE -acetaminophen  10-325 MG tablet Commonly known as: PERCOCET Take 1 tablet by mouth 4 (four) times daily.   rosuvastatin  20 MG tablet Commonly known as: Crestor  Take 1 tablet (20 mg total) by mouth daily. What changed: when to take this   ticagrelor 90 MG Tabs tablet Commonly known as: BRILINTA Take 1 tablet (90 mg total) by mouth 2 (two) times daily.       LABORATORY STUDIES CBC    Component Value Date/Time   WBC 10.0 11/10/2023 0652   RBC 3.99 (L) 11/10/2023 0652   HGB 12.6 (L) 11/10/2023 0652   HCT 38.6 (L) 11/10/2023 0652   PLT 291 11/10/2023 0652   MCV 96.7 11/10/2023 0652   MCH 31.6 11/10/2023 0652   MCHC 32.6 11/10/2023 0652   RDW 13.0 11/10/2023 0652   LYMPHSABS 2.3 11/10/2023 0652   MONOABS 0.6 11/10/2023 0652   EOSABS 0.0 11/10/2023 0652   BASOSABS 0.0 11/10/2023 0652   CMP    Component Value Date/Time   NA 141 11/10/2023 0652   NA 139 12/21/2015 1404   K 3.4 (L) 11/10/2023 0652   CL 110 11/10/2023 0652   CO2 21 (L) 11/10/2023 0652   GLUCOSE 110 (H) 11/10/2023 7829  BUN 7 11/10/2023 0652   BUN 7 12/21/2015 1404   CREATININE 0.97 11/10/2023 0652   CALCIUM  8.4 (L) 11/10/2023 0652   PROT 6.4 (L) 08/01/2023 0407   PROT 7.1 12/21/2015 1404   ALBUMIN 3.8 08/01/2023 0407   ALBUMIN 4.5 12/21/2015 1404   AST 14 (L) 08/01/2023 0407   ALT 16 08/01/2023 0407   ALKPHOS 72 08/01/2023 0407   BILITOT 0.3 08/01/2023 0407   BILITOT 0.2 12/21/2015 1404   GFRNONAA >60 11/10/2023 0652   GFRAA >60 09/09/2017 0400   COAGS Lab Results  Component Value Date   INR 1.00 09/08/2017   INR 0.90 12/09/2016   INR 0.97 08/25/2016   Lipid Panel    Component Value Date/Time   CHOL 137 11/10/2023 0652   CHOL 192 12/21/2015 1404   TRIG 123 11/10/2023 0652   HDL 29 (L) 11/10/2023 0652   HDL  34 (L) 12/21/2015 1404   CHOLHDL 4.7 11/10/2023 0652   VLDL 25 11/10/2023 0652   LDLCALC 83 11/10/2023 0652   LDLCALC 94 12/21/2015 1404   HgbA1C  Lab Results  Component Value Date   HGBA1C 5.5 07/31/2023   Urine Drug Screen  Drugs of Abuse     Component Value Date/Time   LABOPIA POSITIVE (A) 08/01/2023 0737   COCAINSCRNUR NONE DETECTED 08/01/2023 0737   LABBENZ NONE DETECTED 08/01/2023 0737   AMPHETMU POSITIVE (A) 08/01/2023 0737   THCU NONE DETECTED 08/01/2023 0737   LABBARB NONE DETECTED 08/01/2023 0737    Alcohol Level    Component Value Date/Time   ETH <11 02/10/2012 1845   SIGNIFICANT DIAGNOSTIC STUDIES MR BRAIN WO CONTRAST Result Date: 11/10/2023 CLINICAL DATA:  Stroke, follow up EXAM: MRI HEAD WITHOUT CONTRAST TECHNIQUE: Multiplanar, multiecho pulse sequences of the brain and surrounding structures were obtained without intravenous contrast. COMPARISON:  CT head Nov 09, 2023. FINDINGS: Brain: Acute or early subacute infarcts in the right basal ganglia and overlying right frontal lobe. Associated edema without significant mass effect. No midline shift. Remote posterior right temporal/occipital infarct. Additional mild scattered T2/FLAIR hyperintensities the white matter, compatible with chronic microvascular ischemic disease. No evidence of acute hemorrhage, mass lesion, or hydrocephalus. Vascular: Major arterial flow voids are maintained at the skull base. Skull and upper cervical spine: Normal marrow signal. Sinuses/Orbits: Clear sinuses.  No acute orbital findings. IMPRESSION: 1. Acute or early subacute infarcts in the right basal ganglia and overlying right frontal lobe. 2. Remote right temporal/occipital infarct. Electronically Signed   By: Stevenson Elbe M.D.   On: 11/10/2023 00:44   CT HEAD W & WO CONTRAST ( ) Result Date: 11/09/2023 CLINICAL DATA:  TIA. Status post revascularization of distal right M1 segment. Fluid assisted stent angioplasty was utilized. Contrast  staining in the posterior right putamen following procedure. EXAM: CT HEAD WITHOUT AND WITH CONTRAST TECHNIQUE: Contiguous axial images were obtained from the base of the skull through the vertex without and with intravenous contrast. RADIATION DOSE REDUCTION: This exam was performed according to the departmental dose-optimization program which includes automated exposure control, adjustment of the mA and/or kV according to patient size and/or use of iterative reconstruction technique. CONTRAST:  75mL OMNIPAQUE  IOHEXOL  350 MG/ML SOLN COMPARISON:  CT head without contrast 11/09/2023 at 10:59 a.m. FINDINGS: Brain: Remote infarct of the posterior right temporal and occipital lobe is again noted. Mild enhancement is present within the right lentiform nucleus. Hemorrhage is present. No other foci of enhancement are present. Other scattered foci white matter hypoattenuation bilaterally are stable. Ventricles are of  normal size. No significant extraaxial fluid collection is present. The brainstem and cerebellum are within normal limits. Midline structures are within normal limits. Vascular: No hyperdense vessel or unexpected calcification. Visible vessels are patent. Stent is now no noted within the distal right M1 segment. Right MCA branch vessels opacify postcontrast. Skull: Calvarium is intact. No focal lytic or blastic lesions are present. No significant extracranial soft tissue lesion is present. Sinuses/Orbits: The paranasal sinuses and mastoid air cells are clear. The globes and orbits are within normal limits. IMPRESSION: 1. Mild enhancement within the right lentiform nucleus compatible may represent subacute infarct. 2. Remote infarct of the posterior right temporal and occipital lobe is again noted. 3. Stent is now no noted within the distal right M1 segment. Right MCA branch vessels opacify postcontrast. 4. Stable mild white matter disease. This likely reflects the sequela of chronic microvascular ischemia.  Electronically Signed   By: Audree Leas M.D.   On: 11/09/2023 18:36   CT ANGIO HEAD NECK W WO CM W PERF (CODE STROKE) Result Date: 11/09/2023 CLINICAL DATA:  Headaches with left-sided deficits for 2 days. EXAM: CT ANGIOGRAPHY HEAD AND NECK CT PERFUSION BRAIN TECHNIQUE: Multidetector CT imaging of the head and neck was performed using the standard protocol during bolus administration of intravenous contrast. Multiplanar CT image reconstructions and MIPs were obtained to evaluate the vascular anatomy. Carotid stenosis measurements (when applicable) are obtained utilizing NASCET criteria, using the distal internal carotid diameter as the denominator. Multiphase CT imaging of the brain was performed following IV bolus contrast injection. Subsequent parametric perfusion maps were calculated using RAPID software. RADIATION DOSE REDUCTION: This exam was performed according to the departmental dose-optimization program which includes automated exposure control, adjustment of the mA and/or kV according to patient size and/or use of iterative reconstruction technique. CONTRAST:  OMNIPAQUE  IOHEXOL  350 MG/ML SOLN COMPARISON:  CT head without contrast 11/09/2023. MR head without and with contrast and MR angio head 07/31/2023. FINDINGS: CTA NECK FINDINGS Aortic arch: Common origin of the left common carotid artery and the innominate artery is noted. Atherosclerotic calcifications are present distal arch. Atherosclerotic changes are present the proximal left subclavian artery. Lumen is narrowed to 2 mm. This compares to more distal vessel of 8 mm. Right carotid system: Right common carotid artery is within normal limits. Bifurcation is unremarkable. Calcifications are present the proximal right ICA without significant stenosis. Moderate tortuosity is present in the distal cervical right ICA without significant stenosis. Left carotid system: Left common carotid artery is within normal limits. Minimal calcifications  are present the bifurcation without significant stenosis. Moderate tortuosity is present in the mid cervical left ICA without focal stenosis. Vertebral arteries: Left vertebral artery is the dominant vessel. Both vertebral arteries originate from the subclavian arteries without significant stenosis. No significant stenosis is present in either vertebral artery in the neck. Skeleton: Vertebral body heights alignment are normal. Endplate changes and uncovertebral spurring most evident at C3-4 and C4-5, left greater than right. The patient is edentulous. No focal osseous lesions are present. Other neck: The soft tissues of the neck are unremarkable. Salivary glands are within normal limits. Thyroid  is normal. No significant adenopathy is present. No focal mucosal or submucosal lesions are present. Upper chest: The lung apices are clear. The thoracic inlet is within normal limits. Review of the MIP images confirms the above findings CTA HEAD FINDINGS Anterior circulation: Minimal atherosclerotic changes are present within the cavernous internal carotid arteries bilaterally without significant stenosis through the ICA termini. The  A1 normal bilaterally. The anterior communicating artery is patent. The left M1 segment is normal. The distal right M 1 segment is occluded. Distal right MCA branch vessels are reconstituted via collaterals, less robust than on the left. Mild attenuation of distal ACA branch vessels and distal left MCA branch vessels. No significant proximal stenosis or occlusion. No aneurysm. Posterior circulation: The PICA origins are visualized and normal. Vertebrobasilar junction and basilar artery normal. Dominant left AICA vessel is present. The superior cerebellar arteries are patent bilaterally. Both posterior cerebral arteries originate from basilar tip. PCA branch vessels are normal bilaterally. No aneurysm is present. Venous sinuses: The dural sinuses are patent. The straight sinus and deep cerebral  veins are intact. Cortical veins are within normal limits. No significant vascular malformation is evident. Anatomic variants: None Review of the MIP images confirms the above findings CT Brain Perfusion Findings: ASPECTS: 10/10 CBF (<30%) Volume: 0mL Perfusion (Tmax>6.0s) volume: 0mL Mismatch Volume: 0mL Of note, T-max asymmetry is noted in the right MCA distribution at 4.0 seconds. This suggests there is a subtle perfusion asymmetry. IMPRESSION: 1. Occlusion of the distal right M1 segment. 2. Distal right MCA branch vessels are reconstituted via collaterals, less robust than on the left. 3. Mild attenuation of distal ACA branch vessels and distal left MCA branch vessels without significant proximal stenosis or occlusion. 4. Minimal atherosclerotic changes at the carotid bifurcations and cavernous internal carotid arteries bilaterally without significant stenosis. 5. Moderate tortuosity of the mid cervical internal carotid arteries bilaterally without focal stenosis. 6. Atherosclerotic changes at the proximal left subclavian artery with luminal narrowing to 2 mm. This compares to more distal vessel of 8 mm. 7. Normal CT perfusion using standard criteria. The above was relayed via text pager to Dr. Baldwin Levee on 11/09/2023 at 11:24 . Electronically Signed   By: Audree Leas M.D.   On: 11/09/2023 11:40   CT HEAD CODE STROKE WO CONTRAST Result Date: 11/09/2023 CLINICAL DATA:  Code stroke. Headache with left-sided deficits for 2 days. EXAM: CT HEAD WITHOUT CONTRAST TECHNIQUE: Contiguous axial images were obtained from the base of the skull through the vertex without intravenous contrast. RADIATION DOSE REDUCTION: This exam was performed according to the departmental dose-optimization program which includes automated exposure control, adjustment of the mA and/or kV according to patient size and/or use of iterative reconstruction technique. COMPARISON:  CT head without contrast and MR head without and with  contrast 07/31/2023 FINDINGS: Brain: Expected evolution of the previously seen right occipital lobe infarct is noted. No new or acute infarcts are evident. Focal hypoattenuation along the anterior margin of the left lentiform nucleus is stable. Deep gray nuclei are otherwise within normal limits. The ventricles are of normal size. No significant white matter lesions are present. No significant extraaxial fluid collection is present. The brainstem and cerebellum are within normal limits. Midline structures are within normal limits. Vascular: No hyperdense vessel or unexpected calcification. Skull: Calvarium is intact. No focal lytic or blastic lesions are present. No significant extracranial soft tissue lesion is present. Sinuses/Orbits: The paranasal sinuses and mastoid air cells are clear. The globes and orbits are within normal limits. ASPECTS [facility] Stroke Program Early CT Score) - Ganglionic level infarction (caudate, lentiform nuclei, internal capsule, insula, M1-M3 cortex): 7/7 - Supraganglionic infarction (M4-M6 cortex): 3/3 Total score (0-10 with 10 being normal): 10/10 IMPRESSION: 1. No acute intracranial abnormality or significant interval change. 2. Expected evolution of the previously seen right occipital lobe infarct. 3. Stable focal hypoattenuation along the anterior  margin of the left lentiform nucleus. This likely represents a small chronic lacunar infarct. 4. Aspects is 10/10. The above was relayed via text pager to Dr. Baldwin Levee on 11/09/2023 at 11:06 . Electronically Signed   By: Audree Leas M.D.   On: 11/09/2023 11:06    HISTORY OF PRESENT ILLNESS 58 y.o. patient with history of anemia, anxiety, arthritis, coronary artery disease, COPD, essential hypertension, GERD, obstructive sleep apnea not on CPAP, opiate use, peripheral neuropathy, remote CVA in January 2025, and vitamin B12 deficiency who was admitted with fluctuating left hemiparesis, left facial droop, left-sided sensory  loss, and dysarthria from OSH.  Patient was transported to [facility] for IR thrombectomy after vessel imaging revealed a right M1 occlusion.  IR thrombectomy with complete achieving TICI2C revascularization followed by reocclusion to severe underlying intracranial arteriosclerosis s/p stent assisted angioplasty with complete TICI 3 revascularization. Initial NIHSS of 8-12 with fluctuating symptoms on arrival.  HOSPITAL COURSE Acute Ischemic Infarct: Acute infarct in the right basal ganglia and overlying right frontal lobe secondary to right M1 MCA occlusion s/p complete TICI 2 C revascularization with reocclusion due to severe underlying intracranial arteriosclerosis requiring subsequent stent-assisted angioplasty with resultant TICI 3 revascularization.  Etiology:  Severe intracranial arteriosclerosis  CT head: No acute intracranial abnormality or significant interval change.  Expected evolution of previously seen right occipital lobe infarct.  Stable focal hypoattenuation along the anterior margin of the left lentiform nucleus.  This likely represents a small chronic lacunar infarct. aspects is 10. CTA head & neck: Occlusion of the distal right M1 segment.  Distal right MCA branch vessels are reconstituted via collaterals, less robust than on the left.  Mild attenuation of distal ACA branch vessels and distal left MCA branch vessels without significant proximal stenosis or occlusion.  Minimal atherosclerotic changes at the carotid bifurcations and cavernous internal carotid arteries bilaterally without significant stenosis.  Moderate tortuosity of the mid cervical internal carotid arteries bilaterally without focal stenosis.  Atherosclerotic changes of the proximal left subclavian artery with luminal narrowing to 2 mm.  This compares to more distal vessel of 8 mm. CT perfusion: Normal CT perfusion using standard criteria. Post IR CT: Mild enhancement within the right lentiform nucleus compatible may represent  subacute infarct.  Remote infarct of the posterior right temporal and occipital lobe is again noted.  Stent is now noted within the distal right M1 segment.  Right MCA branch vessels opacify postcontrast.  Stable mild white matter disease.  This likely reflects the sequela of chronic microvascular ischemia. MRI brain: Acute or early subacute infarcts in the right basal ganglia and overlying right frontal lobe.  Remote right temporal/occipital infarct. 2D Echo EF 60 to 65%, mild LVH. LDL 62 HgbA1c 5.5 VTE prophylaxis - SCDs aspirin  81 mg daily and clopidogrel  75 mg daily prior to admission, now on aspirin  81 mg daily and Brilinta (ticagrelor) 90 mg bid for 6 months due to intracranial stent and then aspirin  alone. Therapy recommendations:  No follow up needed  Disposition:  Discharge to home   Hx of Stroke/TIA January 2025 MRI brain with evolving subacute ischemic infarct involving the right occipital lobe.  Associated petechial blood products without frank hemorrhagic transformation or significant regional mass effect.  Additional subcentimeter early subacute ischemic infarct involving the right frontal centrum semiovale. MRA head with focal severe proximal right M2 stenosis, inferior division. Discharged on dual antiplatelet therapy for 3 months. Antiplatelet therapy recently held for planned right shoulder surgery on 11/19/2023. With dizziness, confusion, concern for  seizure-like episodes as well.  EEG negative at this time.   Hypertension Home meds: Norvasc , Toprol  XL Stable Blood pressure goal 120-140 for 24 hours post-IR then < 160 mmHg, gradual normotension Continue home Norvasc  10 mg Hold Toprol  XL due to bradycardia   Hyperlipidemia Home meds:  rosuvastatin  20 mg, resumed in hospital LDL 62, goal < 70 Continue statin at discharge   Other Stroke Risk Factors Coronary artery disease Obstructive sleep apnea, not on CPAP at home Will need outpatient formal testing of OSA, refused  sleep study inpatient   Other Active Problems Chronic back and shoulder pain Resumed home gabapentin   Recently electively stopped antiplatelet therapies for planned elective surgery 5/14 Hypokalemia K 3.8->3.4   DISCHARGE EXAM  PHYSICAL EXAM General:  Alert, well-nourished, well-developed patient in no acute distress Psych:  Mood and affect appropriate for situation, calm and cooperative with exam CV: Sinus bradycardia on telemetry Respiratory:  Regular, unlabored respirations on room air GI: Abdomen soft and nontender   NEURO:  Mental Status: AA&Ox3, patient is able to give clear and coherent history Speech/Language: speech is without dysarthria or aphasia.  Naming, repetition, fluency, and comprehension intact.   Cranial Nerves:  II: PERRL. Partial left visual field cut III, IV, VI: EOMI. Eyelids elevate symmetrically.  V: Sensation is intact to light touch and symmetrical to face.  VII: Mild left mouth droop. VIII: Hearing intact to voice. IX, X: Phonation intact XI: Shoulder shrug 5/5. XII: Tongue protrudes midline Motor: 5/5 strength to all muscle groups tested.  Some limitation of right upper extremity confrontational strength testing due to chronic right shoulder pain with planned surgical operation in the future. Tone: is normal and bulk is normal Sensation: Intact to light touch bilaterally. Extinction absent to light touch to DSS.   Coordination: FTN intact bilaterally, HKS: no ataxia in BLE.No drift.  Gait: Deferred  Discharge Diet       Diet   Diet regular Room service appropriate? Yes; Fluid consistency: Thin   liquids  DISCHARGE PLAN Disposition: Home aspirin  81 mg daily and Brilinta (ticagrelor) 90 mg bid for secondary stroke prevention for 6 months then aspirin  81 mg daily alone. Ongoing aggressive stroke risk factor control by Primary Care Physician at time of discharge Follow-up PCP Omie Bickers, MD in 2 weeks. Follow-up in [facility] in 4-6 weeks, office to schedule an appointment.   35 minutes were spent preparing discharge.  Stevi Toberman, AGACNP-BC [facility] Pager: [phone]  I have personally obtained history,examined this patient, reviewed notes, independently viewed imaging studies, participated in medical decision making and plan of care.ROS completed by me personally and pertinent positives fully documented  I have made any additions or clarifications directly to the above note. Agree with note above.    Ardella Beaver, MD Medical Director [facility] Pager: [phone] 11/11/2023 4:41 PM

## 2023-11-10 NOTE — Telephone Encounter (Signed)
 Patient Product/process development scientist completed.    The patient is insured through [facility] MEDICAID.     Ran test claim for Brilinta 90 mg and the current 30 day co-pay is $4.00.   This test claim was processed through [facility]- copay amounts may vary at other pharmacies due to pharmacy/plan contracts, or as the patient moves through the different stages of their insurance plan.     Roland Earl, CPHT Pharmacy Technician III Certified Patient Advocate [facility] Patient Advocate Team Direct Number: [phone]  Fax: [phone]

## 2023-11-10 NOTE — Progress Notes (Signed)
 Occupational Therapy Discharge Patient Details Name: Mathew Brady MRN: [id number] DOB: 1966-06-21 Today's Date: 11/10/2023 Time:  -     Patient discharged from OT services secondary to  baseline .  Please see latest therapy progress note for current level of functioning and progress toward goals.    Progress and discharge plan discussed with patient and/or caregiver: Patient/Caregiver agrees with plan  GO     [name] 11/10/2023, 12:39 PM

## 2023-11-10 NOTE — Evaluation (Signed)
 Physical Therapy Evaluation Patient Details Name: Mathew Brady MRN: [id number] DOB: Dec 04, 1965 Today's Date: 11/10/2023  History of Present Illness  The pt is a 58 yo male presenting 5/4 with acute onset L-sided weakness. Work up revealed infarcts in R BG and R frontal lobe, occlusion of R MCA. S/p revascularization and stent placement 5/4. PMH includes: R occipital infarct 08/01/23 without residual deficit, R MCA stenosis, HTN, HLD, chronic back and shoulder pain awaiting R rotator cuff repair, CAD, COPD, peripheral neuropathy, and fibromyalgia.   Clinical Impression  Pt in bed upon arrival of PT, agreeable to evaluation at this time. Prior to admission the pt was completely independent, walking without DME, and independent with ADLs and IADLs at home. The pt reports minor limitations in activity due to pain in R shoulder (awaiting rotator cuff surgery), but generally independent. The pt was able to demo good independence with sit-stand transfers, and short distance ambulation to bathroom in room without DME. With unchallenged gait, pt generally stable, able to ambulate with supervision without LOB. With addition of balance challenge, pt with slightly slowed gait, but no LOB or need for support. He also completed a flight of stairs with CGA and use of single rail. Discussed gradual progression to return to activity, attempted to educate pt and family on signs/sx of stroke, but no follow up therapy indicated at this time. Will continue to follow acutely to further progress mobility and balance during admission.   Dynamic Gait Index (DGI): 16/24 (<19 indicates increased risk for falls)         If plan is discharge home, recommend the following: A little help with walking and/or transfers;A little help with bathing/dressing/bathroom;Assistance with cooking/housework;Help with stairs or ramp for entrance   Can travel by private vehicle        Equipment Recommendations None recommended by PT   Recommendations for Other Services       Functional Status Assessment Patient has had a recent decline in their functional status and demonstrates the ability to make significant improvements in function in a reasonable and predictable amount of time.     Precautions / Restrictions Precautions Precautions: Fall Recall of Precautions/Restrictions: Intact Restrictions Weight Bearing Restrictions Per Provider Order: No      Mobility  Bed Mobility               General bed mobility comments: pt OOB in chair at start and end of session    Transfers Overall transfer level: Needs assistance Equipment used: None Transfers: Sit to/from Stand, Bed to chair/wheelchair/BSC Sit to Stand: Supervision   Step pivot transfers: Supervision       General transfer comment: no DME, no assist, pt reports he has been going to bathroom without assist overnight    Ambulation/Gait Ambulation/Gait assistance: Supervision Gait Distance (Feet): 300 Feet Assistive device: None Gait Pattern/deviations: Step-through pattern, Decreased stride length Gait velocity: decreased Gait velocity interpretation: <1.31 ft/sec, indicative of household ambulator   General Gait Details: decreased stride on RLE due to taping from groin site. no overt buckling or LOB, able to complete balance challenge well  Stairs Stairs: Yes Stairs assistance: Contact guard assist Stair Management: One rail Right, Alternating pattern, Forwards Number of Stairs: 10     Modified Rankin (Stroke Patients Only) Modified Rankin (Stroke Patients Only) Pre-Morbid Rankin Score: No symptoms Modified Rankin: Moderately severe disability     Balance Overall balance assessment: Mild deficits observed, not formally tested  Standardized Balance Assessment Standardized Balance Assessment : Dynamic Gait Index   Dynamic Gait Index Level Surface: Mild Impairment Change in Gait  Speed: Mild Impairment Gait with Horizontal Head Turns: Mild Impairment Gait with Vertical Head Turns: Mild Impairment Gait and Pivot Turn: Mild Impairment Step Over Obstacle: Mild Impairment Step Around Obstacles: Mild Impairment Steps: Mild Impairment Total Score: 16       Pertinent Vitals/Pain Pain Assessment Pain Assessment: No/denies pain    Home Living Family/patient expects to be discharged to:: Private residence Living Arrangements: Spouse/significant other Available Help at Discharge: Family;Available 24 hours/day (wife working on getting a job but currently available) Type of Home: House Home Access: Level entry       Home Layout: One level Home Equipment: Agricultural consultant (2 wheels);Tub bench;Wheelchair - manual;Grab bars - toilet;Grab bars - tub/shower;BSC/3in1 Additional Comments: wife looking for jobs where she would be working at night, living in RV where he has 3 steps to enter    Prior Function Prior Level of Function : Independent/Modified Independent             Mobility Comments: pt independent, no DME, no falls. limited by R shoulder pain with activity ADLs Comments: Pt was independent in ADLS IADLs, driving and working     Extremity/Trunk Assessment   Upper Extremity Assessment Upper Extremity Assessment: Defer to OT evaluation;Right hand dominant;RUE deficits/detail RUE Deficits / Details: hx of shoulder issues and awaiting R rotator cuff repair    Lower Extremity Assessment Lower Extremity Assessment: Overall WFL for tasks assessed (groslly 5/5 to MMT, reports sensation intact)    Cervical / Trunk Assessment Cervical / Trunk Assessment: Normal  Communication   Communication Communication: No apparent difficulties    Cognition Arousal: Alert Behavior During Therapy: Impulsive   PT - Cognitive impairments: No apparent impairments                       PT - Cognition Comments: pt near baseline per family, functional within  session. pt very independent and slightly impulsive with limited safety insight. Following commands: Intact       Cueing       General Comments General comments (skin integrity, edema, etc.): family present and supportive, attempted to discuss stroke signs/symptoms but pt at times resistant to teachback method. family present and agreeable, demos understanding        Assessment/Plan    PT Assessment Patient needs continued PT services  PT Problem List Decreased balance;Decreased activity tolerance;Decreased mobility;Decreased safety awareness       PT Treatment Interventions DME instruction;Gait training;Stair training;Functional mobility training;Therapeutic activities;Therapeutic exercise;Balance training;Neuromuscular re-education;Cognitive remediation;Patient/family education    PT Goals (Current goals can be found in the Care Plan section)  Acute Rehab PT Goals Patient Stated Goal: return to independence, get shoulder surgery PT Goal Formulation: With patient/family Time For Goal Achievement: 11/24/23 Potential to Achieve Goals: Good    Frequency Min 2X/week        AM-PAC PT "6 Clicks" Mobility  Outcome Measure Help needed turning from your back to your side while in a flat bed without using bedrails?: None Help needed moving from lying on your back to sitting on the side of a flat bed without using bedrails?: None Help needed moving to and from a bed to a chair (including a wheelchair)?: A Little Help needed standing up from a chair using your arms (e.g., wheelchair or bedside chair)?: A Little Help needed to walk in hospital room?: A Little Help  needed climbing 3-5 steps with a railing? : A Little 6 Click Score: 20    End of Session Equipment Utilized During Treatment: Gait belt Activity Tolerance: Patient tolerated treatment well Patient left: in chair;with call bell/phone within reach;with family/visitor present Nurse Communication: Mobility status PT Visit  Diagnosis: Unsteadiness on feet (R26.81)    Time: 1010-1038 PT Time Calculation (min) (ACUTE ONLY): 28 min   Charges:   PT Evaluation $PT Eval Moderate Complexity: 1 Mod PT Treatments $Therapeutic Activity: 8-22 mins PT General Charges $$ ACUTE PT VISIT: 1 Visit         Barnabas Booth, PT, DPT   Acute Rehabilitation Department Office [phone] Secure Chat Communication Preferred  Lona Rist 11/10/2023, 11:41 AM

## 2023-11-11 NOTE — Anesthesia Postprocedure Evaluation (Signed)
 Anesthesia Post Note  Patient: Mathew Brady  Procedure(s) Performed: RADIOLOGY WITH ANESTHESIA     Patient location during evaluation: PACU Anesthesia Type: General Level of consciousness: awake and alert Pain management: pain level controlled Vital Signs Assessment: post-procedure vital signs reviewed and stable Respiratory status: spontaneous breathing, nonlabored ventilation, respiratory function stable and patient connected to nasal cannula oxygen Cardiovascular status: blood pressure returned to baseline and stable Postop Assessment: no apparent nausea or vomiting Anesthetic complications: no   No notable events documented.  Last Vitals:  Vitals:   11/10/23 1500 11/10/23 1600  BP: 118/68   Pulse:    Resp: 20   Temp:  36.6 C  SpO2:      Last Pain:  Vitals:   11/10/23 1615  TempSrc:   PainSc: 7                  Mitsugi Schrader S

## 2023-11-18 ENCOUNTER — Telehealth (HOSPITAL_COMMUNITY): Payer: Self-pay

## 2023-11-18 NOTE — Telephone Encounter (Signed)
Called to schedule 2 wk f/u, no answer, left vm. AB  

## 2023-11-19 ENCOUNTER — Encounter (HOSPITAL_COMMUNITY): Admission: RE | Payer: Self-pay | Source: Ambulatory Visit

## 2023-11-19 ENCOUNTER — Ambulatory Visit (HOSPITAL_COMMUNITY): Admission: RE | Admit: 2023-11-19 | Source: Ambulatory Visit | Admitting: Orthopedic Surgery

## 2023-11-19 SURGERY — ARTHROSCOPY, SHOULDER, WITH ROTATOR CUFF REPAIR
Anesthesia: Choice | Site: Shoulder | Laterality: Right

## 2023-11-21 ENCOUNTER — Other Ambulatory Visit: Payer: Self-pay

## 2023-11-21 ENCOUNTER — Emergency Department (HOSPITAL_COMMUNITY)

## 2023-11-21 ENCOUNTER — Emergency Department (HOSPITAL_COMMUNITY)
Admission: EM | Admit: 2023-11-21 | Discharge: 2023-11-21 | Disposition: A | Discharge status: Home / Self Care | Attending: Emergency Medicine | Admitting: Emergency Medicine

## 2023-11-21 ENCOUNTER — Telehealth: Payer: Self-pay

## 2023-11-21 ENCOUNTER — Encounter (HOSPITAL_COMMUNITY): Payer: Self-pay | Admitting: *Deleted

## 2023-11-21 DIAGNOSIS — G44229 Chronic tension-type headache, not intractable: Secondary | ICD-10-CM | POA: Insufficient documentation

## 2023-11-21 DIAGNOSIS — Z7982 Long term (current) use of aspirin: Secondary | ICD-10-CM | POA: Diagnosis not present

## 2023-11-21 DIAGNOSIS — R531 Weakness: Secondary | ICD-10-CM | POA: Diagnosis not present

## 2023-11-21 DIAGNOSIS — Z79899 Other long term (current) drug therapy: Secondary | ICD-10-CM | POA: Insufficient documentation

## 2023-11-21 DIAGNOSIS — R519 Headache, unspecified: Secondary | ICD-10-CM

## 2023-11-21 DIAGNOSIS — I639 Cerebral infarction, unspecified: Secondary | ICD-10-CM

## 2023-11-21 DIAGNOSIS — I10 Essential (primary) hypertension: Secondary | ICD-10-CM | POA: Diagnosis not present

## 2023-11-21 LAB — CBC WITH DIFFERENTIAL/PLATELET
Abs Immature Granulocytes: 0.04 10*3/uL (ref 0.00–0.07)
Basophils Absolute: 0.1 10*3/uL (ref 0.0–0.1)
Basophils Relative: 1 %
Eosinophils Absolute: 0 10*3/uL (ref 0.0–0.5)
Eosinophils Relative: 0 %
HCT: 47.4 % (ref 39.0–52.0)
Hemoglobin: 15.6 g/dL (ref 13.0–17.0)
Immature Granulocytes: 0 %
Lymphocytes Relative: 25 %
Lymphs Abs: 2.2 10*3/uL (ref 0.7–4.0)
MCH: 31.6 pg (ref 26.0–34.0)
MCHC: 32.9 g/dL (ref 30.0–36.0)
MCV: 96 fL (ref 80.0–100.0)
Monocytes Absolute: 0.8 10*3/uL (ref 0.1–1.0)
Monocytes Relative: 9 %
Neutro Abs: 5.8 10*3/uL (ref 1.7–7.7)
Neutrophils Relative %: 65 %
Platelets: 423 10*3/uL — ABNORMAL HIGH (ref 150–400)
RBC: 4.94 MIL/uL (ref 4.22–5.81)
RDW: 12.8 % (ref 11.5–15.5)
WBC: 9 10*3/uL (ref 4.0–10.5)
nRBC: 0 % (ref 0.0–0.2)

## 2023-11-21 LAB — COMPREHENSIVE METABOLIC PANEL WITH GFR
ALT: 18 U/L (ref 0–44)
AST: 16 U/L (ref 15–41)
Albumin: 4.4 g/dL (ref 3.5–5.0)
Alkaline Phosphatase: 107 U/L (ref 38–126)
Anion gap: 10 (ref 5–15)
BUN: 11 mg/dL (ref 6–20)
CO2: 26 mmol/L (ref 22–32)
Calcium: 10 mg/dL (ref 8.9–10.3)
Chloride: 102 mmol/L (ref 98–111)
Creatinine, Ser: 1 mg/dL (ref 0.61–1.24)
GFR, Estimated: >60 mL/min (ref >60)
Glucose, Bld: 89 mg/dL (ref 70–99)
Potassium: 4.3 mmol/L (ref 3.5–5.1)
Sodium: 138 mmol/L (ref 135–145)
Total Bilirubin: 0.7 mg/dL (ref 0.0–1.2)
Total Protein: 8.1 g/dL (ref 6.5–8.1)

## 2023-11-21 MED ORDER — KETOROLAC TROMETHAMINE 30 MG/ML IJ SOLN
30.0000 mg | Freq: Once | INTRAMUSCULAR | Status: AC
  Administered 2023-11-21: 30 mg via INTRAVENOUS
  Filled 2023-11-21: qty 1

## 2023-11-21 MED ORDER — PROCHLORPERAZINE MALEATE 10 MG PO TABS: 10.0000 mg | ORAL_TABLET | Freq: Two times a day (BID) | ORAL | 0 refills | Status: DC | PRN

## 2023-11-21 MED ORDER — DIPHENHYDRAMINE HCL 50 MG/ML IJ SOLN
25.0000 mg | Freq: Once | INTRAMUSCULAR | Status: AC
  Administered 2023-11-21: 25 mg via INTRAVENOUS
  Filled 2023-11-21: qty 1

## 2023-11-21 MED ORDER — MORPHINE SULFATE (PF) 4 MG/ML IV SOLN
4.0000 mg | Freq: Once | INTRAVENOUS | Status: AC
  Administered 2023-11-21: 4 mg via INTRAVENOUS
  Filled 2023-11-21: qty 1

## 2023-11-21 MED ORDER — PROCHLORPERAZINE EDISYLATE 10 MG/2ML IJ SOLN
10.0000 mg | Freq: Once | INTRAMUSCULAR | Status: AC
  Administered 2023-11-21: 10 mg via INTRAVENOUS
  Filled 2023-11-21: qty 2

## 2023-11-21 NOTE — Transitions of Care (Post Inpatient/ED Visit) (Signed)
 Stroke Discharge Follow-up   11/21/2023 Name:  Mathew Brady MRN:  [id number] DOB:  [birth year]  Subjective: Mathew Brady is a 58 y.o. year old male who is a primary care patient of Del Favia, Lethia Raveling, MD An Emmi alert was received indicating patient responded to questions: Feeling worse overall?. I reached out by phone to follow up on the alert and spoke to Patient.  Patient reports a severe headache. States that he has had a severe headache since leaving the hospital. Reports worsening memory.  Also states BP 80/40 and he stopped his BP medications, Today BP ( while on the phone with me 157/89.  Patient reports that he feels like his head is going to explode and come out his NOSE.  Reviewed options of returning to the ED. He states that he does not feel like waiting.  I placed call to PCP office and office was closed. Called back at 2pm when office opened and reported patients S/S and MD unable to come to the phone.  Left a VM for nurse.  Patient called back and did not leave a message. I called patient back and he is in the ED.  PCP notified via VM.    Documentation in the UP TO DATE STROKE PROGRAM.  See additional documentation.    Today's Vitals   11/21/23 1324  BP: (!) 157/89     Care Management Interventions:  Placed call to Dr. Avery Bodo office.  They were closed for lunch.  Returned call to Dr. Diannia Forward office. Reviewed patient symptoms with staff.  I was directed to leave a voicemail with nurse [place].  I left a detailed VM about patient symptoms and requested a call back or MD call patient.   Follow up plan: Patient went to the ED at [facility].    Orpha Blade, RN, BSN, CEN [employer]- Transition of Care Team.  [facility] [phone]

## 2023-11-21 NOTE — Discharge Instructions (Addendum)
 Your testing has been reassuring - no new strokes, no bleeding, normal blood work. If you are not getting better with tylenol  for headache, try the Compazine, you should take a tablet of Benadryl  with this to make sure that you do not have the side effects of the medication.  I do want you to follow-up with your family doctor on Monday, ER for severe worsening symptoms

## 2023-11-21 NOTE — ED Triage Notes (Signed)
 Pt c/o blue/purple spots in his vision along with extreme weakness that he noticed when he woke up this morning around 1000. He also reports a "terrible headache, as if my nose is about to pop off" that has been going on for a few days.  Pt reports recent surgery to have intracranial stent placed. Pt went to bed last night at 1900 with only the headache.

## 2023-11-21 NOTE — ED Provider Notes (Signed)
 [place] EMERGENCY DEPARTMENT AT [facility] Provider Note   CSN: [id number] Arrival date & time: 11/21/23  1504     History  Chief Complaint  Patient presents with   Weakness    Mathew Brady is a 58 y.o. male.   Weakness  This patient is a 58 year old male, he has a known history of multiple medical problems including hypertension, he had a recent stroke, he was admitted to the hospital within the last 10 days, the patient was seen and admitted to the hospital with an ischemic stroke, he had a history of an MCA territory acute ischemic infarct, had a thrombectomy and a rescue stent.  He had a stroke in January 2025 as well.  The patient is on aspirin  and Brilinta , he reports that he has been having mild headaches but these seemingly got a lot worse today and he is now seeing spots in his vision with streaks of electricity or lightening that go through his vision, severe pressure in his head, he has been known to have "severe intracranial arterial sclerosis".  No vomiting, no diarrhea, no fevers or chills, no stiff neck.    Home Medications Prior to Admission medications   Medication Sig Start Date End Date Taking? Authorizing Provider  prochlorperazine (COMPAZINE) 10 MG tablet Take 1 tablet (10 mg total) by mouth 2 (two) times daily as needed for nausea or vomiting (Nausea ). 11/21/23  Yes Early Glisson, MD  albuterol  (PROVENTIL  HFA;VENTOLIN  HFA) 108 (90 Base) MCG/ACT inhaler Inhale 2 puffs into the lungs every 4 (four) hours as needed for wheezing or shortness of breath. 03/25/17   Nestor, Jennings E, MD  ALPRAZolam  (XANAX ) 0.5 MG tablet Take 0.5 mg by mouth at bedtime as needed for anxiety.    [provider]  amLODipine  (NORVASC ) 10 MG tablet Take 10 mg by mouth daily in the afternoon. Patient not taking: Reported on 11/10/2023    [provider]  aspirin  81 MG chewable tablet Chew 1 tablet (81 mg total) by mouth daily. 11/11/23   Toberman, Stevi W, NP   gabapentin  (NEURONTIN ) 800 MG tablet Take 1 tablet (800 mg total) by mouth 3 (three) times daily. Patient taking differently: Take 800 mg by mouth in the morning, at noon, in the evening, and at bedtime. 08/01/23   Mathew, Taye T, MD  HYDROmorphone  (DILAUDID ) 2 MG tablet Take 2 mg by mouth every 4 (four) hours.    [provider]  methocarbamol  (ROBAXIN ) 500 MG tablet Take 500 mg by mouth at bedtime.    [provider]  ondansetron  (ZOFRAN ) 4 MG tablet Take 4 mg by mouth every 8 (eight) hours as needed for refractory nausea / vomiting, vomiting or nausea. 10/07/23   [provider]  oxyCODONE -acetaminophen  (PERCOCET) 10-325 MG tablet Take 1 tablet by mouth 4 (four) times daily. 03/05/18   [provider]  rosuvastatin  (CRESTOR ) 20 MG tablet Take 1 tablet (20 mg total) by mouth daily. Patient taking differently: Take 20 mg by mouth every evening. 09/26/23 12/25/23  Mathew Cleveland, MD  ticagrelor  (BRILINTA ) 90 MG TABS tablet Take 1 tablet (90 mg total) by mouth 2 (two) times daily. 11/10/23   Toberman, Stevi W, NP      Allergies    Succinylcholine and Pregabalin     Review of Systems   Review of Systems  Neurological:  Positive for weakness.  All other systems reviewed and are negative.   Physical Exam Updated Vital Signs BP (!) 140/85   Pulse  72   Temp 98.2 F (36.8 C) (Oral)   Resp 18   Ht 1.753 m (5\' 9" )   Wt 88.5 kg   SpO2 97%   BMI 28.80 kg/m  Physical Exam Vitals and nursing note reviewed.  Constitutional:      General: He is not in acute distress.    Appearance: He is well-developed.  HENT:     Head: Normocephalic and atraumatic.     Mouth/Throat:     Pharynx: No oropharyngeal exudate.  Eyes:     General: No scleral icterus.       Right eye: No discharge.        Left eye: No discharge.     Conjunctiva/sclera: Conjunctivae normal.     Pupils: Pupils are equal, round, and reactive to light.  Neck:     Thyroid : No thyromegaly.      Vascular: No JVD.  Cardiovascular:     Rate and Rhythm: Normal rate and regular rhythm.     Heart sounds: Normal heart sounds. No murmur heard.    No friction rub. No gallop.  Pulmonary:     Effort: Pulmonary effort is normal. No respiratory distress.     Breath sounds: Normal breath sounds. No wheezing or rales.  Abdominal:     General: Bowel sounds are normal. There is no distension.     Palpations: Abdomen is soft. There is no mass.     Tenderness: There is no abdominal tenderness.  Musculoskeletal:        General: No tenderness. Normal range of motion.     Cervical back: Normal range of motion and neck supple.     Right lower leg: No edema.     Left lower leg: No edema.  Lymphadenopathy:     Cervical: No cervical adenopathy.  Skin:    General: Skin is warm and dry.     Findings: No erythema or rash.  Neurological:     General: No focal deficit present.     Mental Status: He is alert.     Coordination: Coordination normal.  Psychiatric:        Behavior: Behavior normal.     ED Results / Procedures / Treatments   Labs (all labs ordered are listed, but only abnormal results are displayed) Labs Reviewed  CBC WITH DIFFERENTIAL/PLATELET - Abnormal; Notable for the following components:      Result Value   Platelets 423 (*)    All other components within normal limits  COMPREHENSIVE METABOLIC PANEL WITH GFR    EKG None  Radiology CT Head Wo Contrast Result Date: 11/21/2023 CLINICAL DATA:  Headache, spots in vision along with extreme weakness after waking up this morning. EXAM: CT HEAD WITHOUT CONTRAST TECHNIQUE: Contiguous axial images were obtained from the base of the skull through the vertex without intravenous contrast. RADIATION DOSE REDUCTION: This exam was performed according to the departmental dose-optimization program which includes automated exposure control, adjustment of the mA and/or kV according to patient size and/or use of iterative reconstruction  technique. COMPARISON:  MRI head 11/10/2023.  CT head 11/09/2023. FINDINGS: Brain: No acute intracranial hemorrhage. Redemonstrated remote infarct in the right parotid occipital lobes. Infarcts in the right basal ganglia and right frontal lobe noted on recent MRI are not well visualized on the current study. No significant edema, midline shift, or mass effect. Redemonstrated right MCA stent. The basilar cisterns are patent. Posterior fossa is unremarkable. No extra-axial fluid collection. Stable size and configuration of the ventricles. Vascular: Right  MCA stent.  No evidence of dense vessel. Skull: No acute or aggressive finding. Sinuses/Orbits: No acute finding. Other: Mastoid air cells are clear. IMPRESSION: No CT evidence of acute intracranial abnormality. Infarcts in the right basal ganglia and right frontal lobe noted on recent MRI are not well visualized on the current study. Remote infarct in the right parieto-occipital lobes. Electronically Signed   By: Denny Flack M.D.   On: 11/21/2023 16:44    Procedures Procedures    Medications Ordered in ED Medications  morphine  (PF) 4 MG/ML injection 4 mg (4 mg Intravenous Given 11/21/23 1704)  ketorolac  (TORADOL ) 30 MG/ML injection 30 mg (30 mg Intravenous Given 11/21/23 1706)  prochlorperazine (COMPAZINE) injection 10 mg (10 mg Intravenous Given 11/21/23 1701)  diphenhydrAMINE  (BENADRYL ) injection 25 mg (25 mg Intravenous Given 11/21/23 1703)    ED Course/ Medical Decision Making/ A&P Clinical Course as of 11/21/23 1845  Fri Nov 21, 2023  1711 Pt is not having any visual abnormalities at this time - seems to come and go - he has no ability to predict when it will happen, I have done an ophthalmologic exam at the bedside and he has a normal-appearing retina and clear optic disks. [BM]    Clinical Course User Index [BM] Early Glisson, MD                                 Medical Decision Making Amount and/or Complexity of Data Reviewed Labs:  ordered. Radiology: ordered.  Risk Prescription drug management.    This patient presents to the ED for concern of severe headache and visual changes, this involves an extensive number of treatment options, and is a complaint that carries with it a high risk of complications and morbidity.  The differential diagnosis includes stroke, symptoms of been going on for over 24 hours but seemingly worse.  Would also consider primary ophthalmologic abnormality, could be a primary headache disorder, could be hemorrhage   Co morbidities that complicate the patient evaluation  Hypertension, prior ischemic stroke and stenting after thrombectomy last week   Additional history obtained:  Additional history obtained from medical record External records from outside source obtained and reviewed including neurology notes and discharge summaries from admission last week   Lab Tests:  I Ordered, and personally interpreted labs.  The pertinent results include: Unremarkable CBC and metabolic panel   Imaging Studies ordered:  I ordered imaging studies including CT scan of the brain without contrast I independently visualized and interpreted imaging which showed no acute findings, there is no abnormal bleeding or masses or tumors or new strokes I agree with the radiologist interpretation   Cardiac Monitoring: / EKG:  The patient was maintained on a cardiac monitor.  I personally viewed and interpreted the cardiac monitored which showed an underlying rhythm of: Normal sinus rhythm   Problem List / ED Course / Critical interventions / Medication management  The was given medications for the headache, with the thought that this might just be a migraine headache with a visual aura, he in fact states that his headache is completely gone after these medications and he feels 100% better and wants to go home I ordered medication including Toradol  Compazine Benadryl  and morphine  Reevaluation of the  patient after these medicines showed that the patient 100% resolved I have reviewed the patients home medicines and have made adjustments as needed   Social Determinants of Health:  Recent stroke  Test / Admission - Considered:  Considered admission but patient resolved         Final Clinical Impression(s) / ED Diagnoses Final diagnoses:  Nonintractable headache, unspecified chronicity pattern, unspecified headache type    Rx / DC Orders ED Discharge Orders          Ordered    prochlorperazine (COMPAZINE) 10 MG tablet  2 times daily PRN        11/21/23 1843              Early Glisson, MD 11/21/23 1845

## 2023-11-21 NOTE — ED Notes (Signed)
 Pt sitting on side of the bed talking with spouse swinging his legs.

## 2023-11-25 ENCOUNTER — Encounter: Payer: Self-pay | Admitting: Emergency Medicine

## 2023-11-26 ENCOUNTER — Ambulatory Visit (HOSPITAL_COMMUNITY)
Admission: RE | Admit: 2023-11-26 | Discharge: 2023-11-26 | Disposition: A | Discharge status: Home / Self Care | Source: Ambulatory Visit

## 2023-11-26 DIAGNOSIS — I639 Cerebral infarction, unspecified: Secondary | ICD-10-CM

## 2023-11-27 HISTORY — PX: IR RADIOLOGIST EVAL & MGMT: IMG5224

## 2023-12-09 ENCOUNTER — Emergency Department (HOSPITAL_COMMUNITY)
Admission: EM | Admit: 2023-12-09 | Discharge: 2023-12-09 | Disposition: A | Discharge status: Home / Self Care | Attending: Emergency Medicine | Admitting: Emergency Medicine

## 2023-12-09 ENCOUNTER — Emergency Department (HOSPITAL_COMMUNITY)

## 2023-12-09 ENCOUNTER — Other Ambulatory Visit: Payer: Self-pay

## 2023-12-09 ENCOUNTER — Encounter (HOSPITAL_COMMUNITY): Payer: Self-pay

## 2023-12-09 DIAGNOSIS — R0682 Tachypnea, not elsewhere classified: Secondary | ICD-10-CM | POA: Diagnosis not present

## 2023-12-09 DIAGNOSIS — F419 Anxiety disorder, unspecified: Secondary | ICD-10-CM

## 2023-12-09 DIAGNOSIS — R1084 Generalized abdominal pain: Secondary | ICD-10-CM | POA: Diagnosis present

## 2023-12-09 DIAGNOSIS — Z7982 Long term (current) use of aspirin: Secondary | ICD-10-CM | POA: Diagnosis not present

## 2023-12-09 DIAGNOSIS — Z8673 Personal history of transient ischemic attack (TIA), and cerebral infarction without residual deficits: Secondary | ICD-10-CM | POA: Insufficient documentation

## 2023-12-09 DIAGNOSIS — J449 Chronic obstructive pulmonary disease, unspecified: Secondary | ICD-10-CM | POA: Diagnosis not present

## 2023-12-09 DIAGNOSIS — R202 Paresthesia of skin: Secondary | ICD-10-CM | POA: Diagnosis not present

## 2023-12-09 DIAGNOSIS — R200 Anesthesia of skin: Secondary | ICD-10-CM | POA: Diagnosis not present

## 2023-12-09 DIAGNOSIS — I10 Essential (primary) hypertension: Secondary | ICD-10-CM | POA: Insufficient documentation

## 2023-12-09 DIAGNOSIS — R000 Tachycardia, unspecified: Secondary | ICD-10-CM | POA: Insufficient documentation

## 2023-12-09 DIAGNOSIS — R140 Abdominal distension (gaseous): Secondary | ICD-10-CM | POA: Insufficient documentation

## 2023-12-09 LAB — ACETAMINOPHEN LEVEL: Acetaminophen (Tylenol), Serum: <10 ug/mL — ABNORMAL LOW (ref 10–30)

## 2023-12-09 LAB — COMPREHENSIVE METABOLIC PANEL WITH GFR
ALT: 20 U/L (ref 0–44)
AST: 21 U/L (ref 15–41)
Albumin: 4 g/dL (ref 3.5–5.0)
Alkaline Phosphatase: 92 U/L (ref 38–126)
Anion gap: 9 (ref 5–15)
BUN: 15 mg/dL (ref 6–20)
CO2: 21 mmol/L — ABNORMAL LOW (ref 22–32)
Calcium: 9.3 mg/dL (ref 8.9–10.3)
Chloride: 103 mmol/L (ref 98–111)
Creatinine, Ser: 1.07 mg/dL (ref 0.61–1.24)
GFR, Estimated: >60 mL/min (ref >60)
Glucose, Bld: 112 mg/dL — ABNORMAL HIGH (ref 70–99)
Potassium: 3.5 mmol/L (ref 3.5–5.1)
Sodium: 133 mmol/L — ABNORMAL LOW (ref 135–145)
Total Bilirubin: 0.8 mg/dL (ref 0.0–1.2)
Total Protein: 7.4 g/dL (ref 6.5–8.1)

## 2023-12-09 LAB — CBC WITH DIFFERENTIAL/PLATELET
Abs Immature Granulocytes: 0.03 10*3/uL (ref 0.00–0.07)
Basophils Absolute: 0 10*3/uL (ref 0.0–0.1)
Basophils Relative: 0 %
Eosinophils Absolute: 0 10*3/uL (ref 0.0–0.5)
Eosinophils Relative: 0 %
HCT: 43.9 % (ref 39.0–52.0)
Hemoglobin: 15.2 g/dL (ref 13.0–17.0)
Immature Granulocytes: 0 %
Lymphocytes Relative: 23 %
Lymphs Abs: 2.2 10*3/uL (ref 0.7–4.0)
MCH: 32.3 pg (ref 26.0–34.0)
MCHC: 34.6 g/dL (ref 30.0–36.0)
MCV: 93.4 fL (ref 80.0–100.0)
Monocytes Absolute: 0.8 10*3/uL (ref 0.1–1.0)
Monocytes Relative: 9 %
Neutro Abs: 6.4 10*3/uL (ref 1.7–7.7)
Neutrophils Relative %: 68 %
Platelets: 306 10*3/uL (ref 150–400)
RBC: 4.7 MIL/uL (ref 4.22–5.81)
RDW: 12.9 % (ref 11.5–15.5)
WBC: 9.6 10*3/uL (ref 4.0–10.5)
nRBC: 0 % (ref 0.0–0.2)

## 2023-12-09 LAB — URINALYSIS, ROUTINE W REFLEX MICROSCOPIC
Bilirubin Urine: NEGATIVE
Glucose, UA: NEGATIVE mg/dL
Hgb urine dipstick: NEGATIVE
Ketones, ur: NEGATIVE mg/dL
Leukocytes,Ua: NEGATIVE
Nitrite: NEGATIVE
Protein, ur: NEGATIVE mg/dL
Specific Gravity, Urine: 1.014 (ref 1.005–1.030)
pH: 7 (ref 5.0–8.0)

## 2023-12-09 LAB — RAPID URINE DRUG SCREEN, HOSP PERFORMED
Amphetamines: POSITIVE — AB
Barbiturates: NOT DETECTED
Benzodiazepines: NOT DETECTED
Cocaine: NOT DETECTED
Opiates: POSITIVE — AB
Tetrahydrocannabinol: NOT DETECTED

## 2023-12-09 LAB — SALICYLATE LEVEL: Salicylate Lvl: <7 mg/dL — ABNORMAL LOW (ref 7.0–30.0)

## 2023-12-09 LAB — TROPONIN I (HIGH SENSITIVITY)
Troponin I (High Sensitivity): 5 ng/L (ref <18)
Troponin I (High Sensitivity): 7 ng/L (ref <18)

## 2023-12-09 LAB — LACTIC ACID, PLASMA: Lactic Acid, Venous: 1.4 mmol/L (ref 0.5–1.9)

## 2023-12-09 LAB — LIPASE, BLOOD: Lipase: 25 U/L (ref 11–51)

## 2023-12-09 LAB — MAGNESIUM: Magnesium: 2 mg/dL (ref 1.7–2.4)

## 2023-12-09 MED ORDER — IOHEXOL 350 MG/ML SOLN
100.0000 mL | Freq: Once | INTRAVENOUS | Status: AC | PRN
  Administered 2023-12-09: 100 mL via INTRAVENOUS

## 2023-12-09 MED ORDER — LACTATED RINGERS IV BOLUS
1000.0000 mL | Freq: Once | INTRAVENOUS | Status: AC
  Administered 2023-12-09: 1000 mL via INTRAVENOUS

## 2023-12-09 NOTE — ED Notes (Signed)
 Called lab about bmet. Stated it was running

## 2023-12-09 NOTE — ED Provider Notes (Addendum)
 [facility] AT [facility] Provider Note   CSN: [id number] Arrival date & time: 12/09/23  1308     History  Chief Complaint  Patient presents with   Numbness    Mathew Brady is a 58 y.o. male.  HPI Patient presents for multiple complaints.  Medical history includes migraines, fibromyalgia, anxiety, hypertension, remote substance abuse, HLD, COPD, CVA.  He was admitted a month ago with a right MCA territory acute infarct.  He underwent thrombectomy and rescue stent placement following reocclusion.  He states that he has not had any residual deficits from this recent stroke.  He continues on aspirin  and Brilinta .  He has recently been having shortness of breath, described as heavy breathing.  This morning, he woke up with generalized abdominal pain and distention.  He has been having bilateral upper arm numbness and tingling.  He denies any unilateral symptoms.    Home Medications Prior to Admission medications   Medication Sig Start Date End Date Taking? Authorizing Provider  albuterol  (PROVENTIL  HFA;VENTOLIN  HFA) 108 (90 Base) MCG/ACT inhaler Inhale 2 puffs into the lungs every 4 (four) hours as needed for wheezing or shortness of breath. 03/25/17   Nestor, Jennings E, MD  ALPRAZolam  (XANAX ) 0.5 MG tablet Take 0.5 mg by mouth at bedtime as needed for anxiety.    [provider]  amLODipine  (NORVASC ) 10 MG tablet Take 10 mg by mouth daily in the afternoon. Patient not taking: Reported on 11/10/2023    [provider]  aspirin  81 MG chewable tablet Chew 1 tablet (81 mg total) by mouth daily. 11/11/23   Toberman, Stevi W, NP  gabapentin  (NEURONTIN ) 800 MG tablet Take 1 tablet (800 mg total) by mouth 3 (three) times daily. Patient taking differently: Take 800 mg by mouth in the morning, at noon, in the evening, and at bedtime. 08/01/23   Gonfa, Taye T, MD  HYDROmorphone  (DILAUDID ) 2 MG tablet Take 2 mg by mouth every 4 (four) hours.    [provider]  methocarbamol  (ROBAXIN ) 500 MG tablet Take 500 mg by mouth at bedtime.    [provider]  nicotine  (NICODERM CQ  - DOSED IN MG/24 HOURS) 14 mg/24hr patch Place 14 mg onto the skin daily. 08/19/23   [provider]  ondansetron  (ZOFRAN ) 4 MG tablet Take 4 mg by mouth every 8 (eight) hours as needed for refractory nausea / vomiting, vomiting or nausea. 10/07/23   [provider]  oxyCODONE -acetaminophen  (PERCOCET) 10-325 MG tablet Take 1 tablet by mouth 4 (four) times daily. 03/05/18   [provider]  prochlorperazine  (COMPAZINE ) 10 MG tablet Take 1 tablet (10 mg total) by mouth 2 (two) times daily as needed for nausea or vomiting (Nausea ). 11/21/23   Early Glisson, MD  rosuvastatin  (CRESTOR ) 20 MG tablet Take 1 tablet (20 mg total) by mouth daily. Patient taking differently: Take 20 mg by mouth every evening. 09/26/23 12/25/23  Cassandra Cleveland, MD  ticagrelor  (BRILINTA ) 90 MG TABS tablet Take 1 tablet (90 mg total) by mouth 2 (two) times daily. 11/10/23   Toberman, Stevi W, NP      Allergies    Succinylcholine and Pregabalin     Review of Systems   Review of Systems  Constitutional:  Positive for fatigue.  Respiratory:  Positive for shortness of breath.   Gastrointestinal:  Positive for abdominal distention and abdominal pain.  Neurological:  Positive for numbness.  All other systems reviewed and are negative.   Physical Exam  Updated Vital Signs BP (!) 153/91   Pulse 100   Resp (!) 21   Ht 5\' 9"  (1.753 m)   Wt 89.8 kg   SpO2 99%   BMI 29.24 kg/m  Physical Exam Vitals and nursing note reviewed.  Constitutional:      General: He is not in acute distress.    Appearance: Normal appearance. He is well-developed. He is not ill-appearing, toxic-appearing or diaphoretic.  HENT:     Head: Normocephalic and atraumatic.     Right Ear: External ear normal.     Left Ear: External ear normal.     Nose: Nose normal.     Mouth/Throat:     Mouth:  Mucous membranes are moist.  Eyes:     Extraocular Movements: Extraocular movements intact.     Conjunctiva/sclera: Conjunctivae normal.  Cardiovascular:     Rate and Rhythm: Regular rhythm. Tachycardia present.  Pulmonary:     Effort: Pulmonary effort is normal. Tachypnea present. No respiratory distress.     Breath sounds: Normal breath sounds. No wheezing, rhonchi or rales.  Abdominal:     General: There is distension.     Palpations: Abdomen is soft.     Tenderness: There is abdominal tenderness. There is no guarding or rebound.  Musculoskeletal:        General: No swelling. Normal range of motion.     Cervical back: Normal range of motion and neck supple.     Right lower leg: No edema.     Left lower leg: No edema.  Skin:    General: Skin is warm and dry.     Coloration: Skin is not jaundiced or pale.  Neurological:     General: No focal deficit present.     Mental Status: He is alert and oriented to person, place, and time.  Psychiatric:        Mood and Affect: Mood normal.        Behavior: Behavior normal.     ED Results / Procedures / Treatments   Labs (all labs ordered are listed, but only abnormal results are displayed) Labs Reviewed  COMPREHENSIVE METABOLIC PANEL WITH GFR - Abnormal; Notable for the following components:      Result Value   Sodium 133 (*)    CO2 21 (*)    Glucose, Bld 112 (*)    All other components within normal limits  RAPID URINE DRUG SCREEN, HOSP PERFORMED - Abnormal; Notable for the following components:   Opiates POSITIVE (*)    Amphetamines POSITIVE (*)    All other components within normal limits  ACETAMINOPHEN  LEVEL - Abnormal; Notable for the following components:   Acetaminophen  (Tylenol ), Serum <10 (*)    All other components within normal limits  SALICYLATE LEVEL - Abnormal; Notable for the following components:   Salicylate Lvl <7.0 (*)    All other components within normal limits  LIPASE, BLOOD  CBC WITH  DIFFERENTIAL/PLATELET  URINALYSIS, ROUTINE W REFLEX MICROSCOPIC  LACTIC ACID, PLASMA  MAGNESIUM   VOLATILES,BLD-ACETONE,ETHANOL,ISOPROP,METHANOL  TROPONIN I (HIGH SENSITIVITY)  TROPONIN I (HIGH SENSITIVITY)    EKG EKG Interpretation Date/Time:  Tuesday December 09 2023 13:24:15 EDT Ventricular Rate:  123 PR Interval:  135 QRS Duration:  136 QT Interval:  345 QTC Calculation: 494 R Axis:   -87  Text Interpretation: Sinus tachycardia Probable left atrial enlargement RBBB and LAFB Left ventricular hypertrophy Confirmed by Iva Mariner [phone]) on 12/09/2023 1:37:29 PM  Radiology DG Chest Portable 1 View Result Date: 12/09/2023  CLINICAL DATA:  shortness of breath EXAM: PORTABLE CHEST - 1 VIEW COMPARISON:  12/24/2021 FINDINGS: Lungs are clear. Heart size and mediastinal contours are within normal limits. No effusion. Visualized bones unremarkable. IMPRESSION: No acute cardiopulmonary disease. Electronically Signed   By: Nicoletta Barrier M.D.   On: 12/09/2023 15:32    Procedures Procedures    Medications Ordered in ED Medications  lactated ringers  bolus 1,000 mL (0 mLs Intravenous Stopped 12/09/23 1443)  iohexol  (OMNIPAQUE ) 350 MG/ML injection 100 mL (100 mLs Intravenous Contrast Given 12/09/23 1535)    ED Course/ Medical Decision Making/ A&P                                 Medical Decision Making Amount and/or Complexity of Data Reviewed Labs: ordered. Radiology: ordered.  Risk Prescription drug management.   This patient presents to the ED for concern of multiple complaints, this involves an extensive number of treatment options, and is a complaint that carries with it a high risk of complications and morbidity.  The differential diagnosis includes polypharmacy, acidosis, anxiety, intoxication, CVA, TIA, symptomatic hypertension   Co morbidities / Chronic conditions that complicate the patient evaluation  migraines, fibromyalgia, anxiety, hypertension, remote substance abuse, HLD, COPD,  CVA   Additional history obtained:  Additional history obtained from EMR External records from outside source obtained and reviewed including N/A   Lab Tests:  I Ordered, and personally interpreted labs.  The pertinent results include: UDS positive for methamphetamine and opiates, despite patient's insistence that he does not voluntarily use any drugs.  Serum lab work is unremarkable.   Imaging Studies ordered:  I ordered imaging studies including chest x-ray, CTA head and neck, CT of abdomen and pelvis I independently visualized and interpreted imaging which showed no acute findings on x-ray, CT imaging pending at time of signout. I agree with the radiologist interpretation   Cardiac Monitoring: / EKG:  The patient was maintained on a cardiac monitor.  I personally viewed and interpreted the cardiac monitored which showed an underlying rhythm of: Sinus rhythm   Problem List / ED Course / Critical interventions / Medication management  Patient with history of recent stroke, presenting with multiple complaints.  Initially, he was endorsing bilateral arm numbness and tingling and generalized abdominal pain.  His vital signs on arrival were notable for hypertension and tachycardia.  Patient was brought to an ED room.  There were no focal deficits present on stroke exam.  After being placed in room, patient asked for proper conversation.  He was at that time that he stated that he felt like he had been getting poisoned at home.  He has had some recent shortness of breath.  He has had some worsening abdominal distention and discomfort.  Last night, his vomiting tasted bitter to him.  When he mentioned this to his wife, she told him it was only because he had a stroke.  Patient does seem anxious at this time.  This is likely contributing to his worrisome vital signs.  Although he is tachypneic, his current breathing is unlabored and he is able to speak in complete sentences.  SpO2 is normal on  room air.  Broad workup was initiated.  Patient's UDS was positive for opiates and methamphetamines despite his insistence that he does not use any recreational drugs.  Notably, UDS showed the same thing 4 months ago.  Methamphetamines would explain his tachycardia, tachypnea, and hypotension on  arrival.  It may also cause delusions.  On reassessment, patient's heart rate has improved.  He is no longer tachypneic.  Remaining workup was pending at time of signout.  Care of patient was signed out to oncoming ED provider. I ordered medication including IV fluids for hydration Reevaluation of the patient after these medicines showed that the patient improved I have reviewed the patients home medicines and have made adjustments as needed   Social Determinants of Health:  Lives at home with wife        Final Clinical Impression(s) / ED Diagnoses Final diagnoses:  Generalized abdominal pain    Rx / DC Orders ED Discharge Orders     None         Iva Mariner, MD 12/09/23 1547    Iva Mariner, MD 12/09/23 [phone]

## 2023-12-09 NOTE — ED Notes (Signed)
 EDP present in Triage to assess the Pt.

## 2023-12-09 NOTE — ED Triage Notes (Signed)
 Pt arrived via POV from home c/o LKW yesterday morning. Pt tachycardic, reporting numbness, weakness, and reports Hx  of multiple strokes. Pt endorsing blurry vision.
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
## 2023-12-09 NOTE — ED Notes (Signed)
 See triage notes. Pt states not really weak or numb but feels "tingly" all over since yesterday. C/o some blurred vision and feeling "flushed" today and "just not feeling right or myself". Pt c/o tasting blood in his mouth and doesn't know why. NIH negative. Pt slightly anxious but eased with therapeutic conversation. Pt a/o.

## 2023-12-09 NOTE — Discharge Instructions (Signed)
 You were seen in the emergency room for evaluation of generalized abdominal pain and a sensation of anxiety.  You are requesting to leave the emergency department before your results have returned.  You have stable vital signs today and if you would like to leave at this moment I will call you if there are any abnormal findings on your CT scans.  Please be prepared to return to the emergency department if there are abnormal findings that need to be addressed.  Please keep your phone on today.

## 2023-12-11 ENCOUNTER — Ambulatory Visit: Admitting: Neurology

## 2023-12-11 ENCOUNTER — Encounter: Payer: Self-pay | Admitting: Neurology

## 2023-12-11 VITALS — BP 144/106 | HR 88 | Resp 17 | Ht 69.0 in | Wt 191.0 lb

## 2023-12-11 DIAGNOSIS — M25511 Pain in right shoulder: Secondary | ICD-10-CM

## 2023-12-11 DIAGNOSIS — G8929 Other chronic pain: Secondary | ICD-10-CM | POA: Diagnosis not present

## 2023-12-11 DIAGNOSIS — F05 Delirium due to known physiological condition: Secondary | ICD-10-CM

## 2023-12-11 DIAGNOSIS — I63511 Cerebral infarction due to unspecified occlusion or stenosis of right middle cerebral artery: Secondary | ICD-10-CM

## 2023-12-11 LAB — VOLATILES,BLD-ACETONE,ETHANOL,ISOPROP,METHANOL
Acetone, blood: <0.01 g/dL (ref 0.000–0.010)
Ethanol, blood: <0.01 g/dL (ref 0.000–0.010)
Isopropanol, blood: <0.01 g/dL (ref 0.000–0.010)
Methanol, blood: <0.01 g/dL (ref 0.000–0.010)

## 2023-12-11 MED ORDER — QUETIAPINE FUMARATE 25 MG PO TABS: 25.0000 mg | ORAL_TABLET | Freq: Every day | ORAL | 0 refills | Status: AC

## 2023-12-11 NOTE — Patient Instructions (Signed)
 Continue current medications including Aspirin  and Brilinta  for a total of 6 months, then continue with Aspirin  alone  Recommend against holding Aspirin  and Brilinta  for any procedure. Patient will have to wait for at least 6 months prior to having shoulder surgery  Will prescribed him Seroquel for his sundowning  Continue to follow up with your doctors  Return in  6 months or sooner if worse

## 2023-12-11 NOTE — Progress Notes (Signed)
 [facility]  PATIENT: Mathew Brady DOB: March 02, 1966  REQUESTING CLINICIAN: Louretta Royals, NP HISTORY FROM: Patient and spouse  REASON FOR VISIT: Strokes follow up.    HISTORICAL  CHIEF COMPLAINT:  Chief Complaint  Patient presents with   Hospitalization Follow-up    Rm13, wife present, HOSPITAL FU - Hospital f/u/Saw Dr. Janett Medin, f/u with Dr Tava Peery/Discharged home 5/5:thought process has changed, easily agitated, can't do things he used to do, hallucination at night like sundowners, talks to people that aren't there, thrashes during sleep, hates night time. Mmse score of 22, shocking sensations from genitals intermittent. Gets stomach and chest pain upon agitation. His left arm was hurting earlier today. Falls asleep while eating     INTERVAL HISTORY 12/11/2023 Patient presents today for follow-up, last visit was in March, at that time he presented for surgical clearance.  He did have a stroke, right hemispheric stroke in January and wanted clearance for a right shoulder surgery.  At that time I told him that recommendation is to wait 58-months prior to any elective surgery.  He told me that he followed-up with his orthopedist surgeon, had a surgery scheduled for May 14 and prior to any additional recommendation he self discontinued his aspirin  and Plavix  and 3, 4 days later he presented to the hospital with acute stroke from a right M1 occlusion.  Patient was taken to IR suite, he did have stent placement with tici 3 reperfusion.  He did well from the stroke perspective, does not have any major deficit but was strongly advised to remain on aspirin  and Brilinta  for at least 6 months.  He tells me that surgery is off the table at the moment.  He is currently on strong pain medications including Percocet, and Dilaudid .  He is also on Neurontin  and Xanax .  He tells me that his right arm is useless.  Wife tells me that patient has been more irritable, has been hallucinating mainly  in the evening, seeing people coming out of the mattress, people in the front yard, he has been accusatory towards wife telling her that "he knows what she did" but does not go into details.  He is also paranoid thinking that people are putting things in his drink, and he is complaining of burning pain on his chest and also a tingling sensation in his penis.  These symptoms usually happen in the evening around the twilight time and he tells me that "he hates twilight tired and he gets really nervous" around that time.   Brief hospital course and summary  58 y.o. patient with history of anemia, anxiety, arthritis, coronary artery disease, COPD, essential hypertension, GERD, obstructive sleep apnea not on CPAP, opiate use, peripheral neuropathy, remote CVA in January 2025, and vitamin B12 deficiency who was admitted with fluctuating left hemiparesis, left facial droop, left-sided sensory loss, and dysarthria from OSH.  Patient was transported to [facility] for IR thrombectomy after vessel imaging revealed a right M1 occlusion.  IR thrombectomy with complete achieving TICI2C revascularization followed by reocclusion to severe underlying intracranial arteriosclerosis s/p stent assisted angioplasty with complete TICI 3 revascularization. Initial NIHSS of 8-12 with fluctuating symptoms on arrival.   HOSPITAL COURSE Acute Ischemic Infarct: Acute infarct in the right basal ganglia and overlying right frontal lobe secondary to right M1 MCA occlusion s/p complete TICI 2 C revascularization with reocclusion due to severe underlying intracranial arteriosclerosis requiring subsequent stent-assisted angioplasty with resultant TICI 3 revascularization.  Etiology:  Severe intracranial arteriosclerosis  CT  head: No acute intracranial abnormality or significant interval change.  Expected evolution of previously seen right occipital lobe infarct.  Stable focal hypoattenuation along the anterior margin of the left lentiform nucleus.   This likely represents a small chronic lacunar infarct. aspects is 10. CTA head & neck: Occlusion of the distal right M1 segment.  Distal right MCA branch vessels are reconstituted via collaterals, less robust than on the left.  Mild attenuation of distal ACA branch vessels and distal left MCA branch vessels without significant proximal stenosis or occlusion.  Minimal atherosclerotic changes at the carotid bifurcations and cavernous internal carotid arteries bilaterally without significant stenosis.  Moderate tortuosity of the mid cervical internal carotid arteries bilaterally without focal stenosis.  Atherosclerotic changes of the proximal left subclavian artery with luminal narrowing to 2 mm.  This compares to more distal vessel of 8 mm. CT perfusion: Normal CT perfusion using standard criteria. Post IR CT: Mild enhancement within the right lentiform nucleus compatible may represent subacute infarct.  Remote infarct of the posterior right temporal and occipital lobe is again noted.  Stent is now noted within the distal right M1 segment.  Right MCA branch vessels opacify postcontrast.  Stable mild white matter disease.  This likely reflects the sequela of chronic microvascular ischemia. MRI brain: Acute or early subacute infarcts in the right basal ganglia and overlying right frontal lobe.  Remote right temporal/occipital infarct. 2D Echo EF 60 to 65%, mild LVH. LDL 62 HgbA1c 5.5 VTE prophylaxis - SCDs aspirin  81 mg daily and clopidogrel  75 mg daily prior to admission, now on aspirin  81 mg daily and Brilinta  (ticagrelor ) 90 mg bid for 6 months due to intracranial stent and then aspirin  alone. Therapy recommendations:  No follow up needed  Disposition:  Discharge to home     HISTORY OF PRESENT ILLNESS:  This is a 58 year old gentleman past medical history of hypertension, hyperlipidemia, chronic back pain chronic shoulder pain, recent strokes in January who is presenting for surgical clearance.   Patient tells me in January he was admitted for an acute stroke but a few days prior to that he was complaining of confusion, irritability, vision problem.  His MRI showed subacute right frontal and right occipital strokes with petechial hemorrhage but no frank hemorrhagic transformation.  MRA with a severe proximal right M2 stenosis.  He was recommended aspirin  and Plavix  for total of 90 days.  He did not go to rehab.   His main complaint today is right shoulder pain and need for surgical clearance.  I have explained to the patient that typically we wait 6 months after an acute stroke before surgery but he stated that he cannot wait 6 months and he need to have his surgery as soon as possible.   Hospital Course and Summary  58 year old M with PMH of COPD, HTN, anxiety, chronic back pain and tobacco use disorder noncompliant with medications presenting with lightheadedness for about 2 weeks, and admitted with uncontrolled hypertension and subacute right frontal and occipital CVA with petechial hemorrhage but no frank hemorrhagic transformation, and remote left basal ganglia lacunar infarct as noted on MRI brain.  MRA head without LVO but focal severe proximal right M2 stenosis.  Patient was admitted for CVA workup.  Neurology consulted, and recommended fluoroscopy guided lumbar puncture.    Patient underwent fluoroscopy guided lumbar puncture.  Preliminary result on CSF reassuring.  Echocardiogram and carotid ultrasound without significant finding.  EEG negative for seizure or epileptiform discharge.  UDS positive for amphetamine although  patient denies using amphetamine iron products or over-the-counter cold and flu meds recently.   Later in the afternoon, patient and family asked if they could be discharged.  Discussed with neurology who cleared patient for discharge on Plavix  and aspirin  for 3 months followed by aspirin  alone.  CSF fluid studies pending.  Subacute CVA: Patient presented with  lightheadedness but no focal neurodeficit.  MRI brain suggestive of subacute CVA.  Right frontal and occipital region.  MRA head without LVO but focal severe proximal right M2 stenosis.  Risk factor include HTN, smoking and noncompliance with meds.  His UDS is also positive for amphetamines although he denies recreational drug use or recent cold and flu medication.  EEG without epileptiform discharges or seizure.  TTE and carotid ultrasound unrevealing.  No history of diabetes.  LDL 62.  A1c 5.5%.  Preliminary result on CSF reassuring. -Neurology recommended Plavix  and aspirin  for 3 months followed by aspirin  alone -Neurology also recommended avoiding hypotension.  HCTZ discontinued. -Counseled on the importance of compliance with medication and smoking cessation. -Discussed urine drug screen finding with patient.  -No need antibiotic by PT OT -Follow other CSF labs.   OTHER MEDICAL CONDITIONS: Hypertension, hyperlipidemia, chronic back pain, chronic right shoulder pain   REVIEW OF SYSTEMS: Full 14 system review of systems performed and negative with exception of: As noted in the HPI   ALLERGIES: Allergies  Allergen Reactions   Succinylcholine Anaphylaxis   Pregabalin  Photosensitivity    HOME MEDICATIONS: Outpatient Medications Prior to Visit  Medication Sig Dispense Refill   albuterol  (PROVENTIL  HFA;VENTOLIN  HFA) 108 (90 Base) MCG/ACT inhaler Inhale 2 puffs into the lungs every 4 (four) hours as needed for wheezing or shortness of breath. 1 Inhaler 6   ALPRAZolam  (XANAX ) 0.5 MG tablet Take 0.5 mg by mouth at bedtime as needed for anxiety.     amLODipine  (NORVASC ) 10 MG tablet Take 10 mg by mouth daily in the afternoon.     aspirin  81 MG chewable tablet Chew 1 tablet (81 mg total) by mouth daily. 30 tablet 1   gabapentin  (NEURONTIN ) 800 MG tablet Take 1 tablet (800 mg total) by mouth 3 (three) times daily. (Patient taking differently: Take 800 mg by mouth in the morning, at noon, in the  evening, and at bedtime.) 90 tablet 0   HYDROmorphone  (DILAUDID ) 2 MG tablet Take 2 mg by mouth every 4 (four) hours.     methocarbamol  (ROBAXIN ) 500 MG tablet Take 500 mg by mouth at bedtime.     nicotine  (NICODERM CQ  - DOSED IN MG/24 HOURS) 14 mg/24hr patch Place 14 mg onto the skin daily.     ondansetron  (ZOFRAN ) 4 MG tablet Take 4 mg by mouth every 8 (eight) hours as needed for refractory nausea / vomiting, vomiting or nausea.     oxyCODONE -acetaminophen  (PERCOCET) 10-325 MG tablet Take 1 tablet by mouth 4 (four) times daily.     prochlorperazine  (COMPAZINE ) 10 MG tablet Take 1 tablet (10 mg total) by mouth 2 (two) times daily as needed for nausea or vomiting (Nausea ). 10 tablet 0   rosuvastatin  (CRESTOR ) 20 MG tablet Take 1 tablet (20 mg total) by mouth daily. (Patient taking differently: Take 20 mg by mouth every evening.) 90 tablet 3   ticagrelor  (BRILINTA ) 90 MG TABS tablet Take 1 tablet (90 mg total) by mouth 2 (two) times daily. 60 tablet 2   No facility-administered medications prior to visit.    PAST MEDICAL HISTORY: Past Medical History:  Diagnosis Date  Anemia 06/18/2012   Anxiety    Arthritis    CAD (coronary artery disease)    Mild to moderate nonobstructive CAD at cardiac catheterization 2018   Complication of anesthesia    hard to wake up   COPD (chronic obstructive pulmonary disease) (HCC)    Essential hypertension    Fibromyalgia    GERD (gastroesophageal reflux disease)    History of kidney stones    Opiate use    Palpitations    Peripheral neuropathy    Pulmonary nodule    Stroke (HCC)    Vitamin B 12 deficiency     PAST SURGICAL HISTORY: Past Surgical History:  Procedure Laterality Date   COLONOSCOPY     found to have 1 polyp   ESOPHAGEAL DILATION  1993   IR CT HEAD LTD  11/09/2023   IR CT HEAD LTD  11/09/2023   IR CT HEAD LTD  11/09/2023   IR PERCUTANEOUS ART THROMBECTOMY/INFUSION INTRACRANIAL INC DIAG ANGIO  11/09/2023   IR RADIOLOGIST EVAL & MGMT   11/27/2023   LEFT HEART CATH AND CORONARY ANGIOGRAPHY N/A 12/09/2016   Procedure: Left Heart Cath and Coronary Angiography;  Surgeon: Sammy Crisp, MD;  Location: [facility] INVASIVE CV LAB;  Service: Cardiovascular;  Laterality: N/A;   LUNG SURGERY Left    2018 upper lobe   RADIOLOGY WITH ANESTHESIA N/A 11/09/2023   Procedure: RADIOLOGY WITH ANESTHESIA;  Surgeon: Radiologist, Medication, MD;  Location: [facility] OR;  Service: Radiology;  Laterality: N/A;  IR ANESTH   TONSILLECTOMY     VIDEO ASSISTED THORACOSCOPY (VATS)/WEDGE RESECTION Left 09/08/2017   Procedure: VIDEO ASSISTED THORACOSCOPY (VATS)/WEDGE RESECTION;  Surgeon: Zelphia Higashi, MD;  Location: [facility] OR;  Service: Thoracic;  Laterality: Left;    FAMILY HISTORY: Family History  Problem Relation Age of Onset   Fibromyalgia Mother    Bladder Cancer Father 61   Benign prostatic hyperplasia Father    Fibromyalgia Sister    Lung cancer Maternal Uncle    Emphysema Maternal Uncle    Lung cancer Paternal Uncle    Stomach cancer Paternal Uncle    Lung cancer Maternal Grandmother    Heart failure Paternal Grandmother    Heart failure Paternal Grandfather    Lung cancer Paternal Grandfather    Healthy Son     SOCIAL HISTORY: Social History   Socioeconomic History   Marital status: Married    Spouse name: Not on file   Number of children: Not on file   Years of education: Not on file   Highest education level: Not on file  Occupational History   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  Tobacco Use   Smoking status: Former    Current packs/day: 0.00    Average packs/day: 1 pack/day for 36.0 years (36.0 ttl pk-yrs)    Types: Cigarettes    Start date: 03/12/1979    Quit date: 03/09/2015    Years since quitting: 8.7    Passive exposure: Past   Smokeless tobacco: Never   Tobacco comments:    1ppd 12/14/21, on nicotine  patches ( since January 2025)  Vaping Use   Vaping status: Never Used  Substance and Sexual Activity   Alcohol use: No     Alcohol/week: 0.0 standard drinks of alcohol   Drug use: No   Sexual activity: Not on file  Other Topics Concern   Not on file  Social History Narrative   Lives with wife in a 2 story home.  Has 5 children all together.  Not working at  this time.   On Leave.  Run shop contruction site.   Education: Some high school.      [facility] (12/13/16):   Originally from [place] 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 Currently has a Cockatoo and chickens. Also has horses, a donkey, dogs, and a cat. No mold exposure. No hot tub exposure.    Social Drivers of Corporate investment banker Strain: Low Risk  (12/29/2020)   [facility] from [facility]   Overall Financial Resource Strain (CARDIA)    Difficulty of Paying Living Expenses: Not hard at all  Food Insecurity: No Food Insecurity (11/21/2023)   Hunger Vital Sign    Worried About Running Out of Food in the Last Year: Never true    Ran Out of Food in the Last Year: Never true  Transportation Needs: No Transportation Needs (11/21/2023)   PRAPARE - Administrator, Civil Service (Medical): No    Lack of Transportation (Non-Medical): No  Physical Activity: Not on file  Stress: Not on file  Social Connections: Not on file  Intimate Partner Violence: Not At Risk (11/21/2023)   Humiliation, Afraid, Rape, and Kick questionnaire    Fear of Current or Ex-Partner: No    Emotionally Abused: No    Physically Abused: No    Sexually Abused: No    PHYSICAL EXAM  GENERAL EXAM/CONSTITUTIONAL: Vitals:  Vitals:   12/11/23 1125 12/11/23 1134  BP: (!) 164/112 (!) 144/106  Pulse: 88   Resp: 17   SpO2: 98%   Weight: 191 lb (86.6 kg)   Height: 5\' 9"  (1.753 m)    Body mass index is 28.21 kg/m. Wt Readings from Last 3 Encounters:  12/11/23 191 lb (86.6 kg)  12/09/23 198 lb (89.8 kg)  11/21/23 195 lb 0.3 oz (88.5 kg)   Patient is in no distress; well  developed, nourished and groomed; neck is supple, appears in pain  MUSCULOSKELETAL: Gait, strength, tone, movements noted in Neurologic exam below  NEUROLOGIC: MENTAL STATUS:     12/11/2023   11:30 AM  MMSE - Mini Mental State Exam  Orientation to time 4  Orientation to Place 5  Registration 3  Attention/ Calculation 0  Recall 3  Language- name 2 objects 2  Language- repeat 1  Language- follow 3 step command 3  Language- read & follow direction 1  Write a sentence 0  Copy design 0  Total score 22   awake, alert, oriented to person, place and time recent and remote memory intact normal attention and concentration language fluent, comprehension intact, naming intact fund of knowledge appropriate  CRANIAL NERVE:  2nd, 3rd, 4th, 6th - Visual fields full to confrontation, extraocular muscles intact, no nystagmus 5th - facial sensation symmetric 7th - facial strength symmetric 8th - hearing intact 9th - palate elevates symmetrically, uvula midline 11th - shoulder shrug symmetric 12th - tongue protrusion midline  MOTOR:  normal bulk and tone, full strength in the BUE, BLE. RUE is limited by pain  SENSORY:  normal and symmetric to light touch  COORDINATION:  finger-nose-finger, fine finger movements normal  GAIT/STATION:  Antalgic    DIAGNOSTIC DATA (LABS, IMAGING, TESTING) - I reviewed patient records, labs, notes, testing and imaging myself where available.  Lab Results  Component Value Date   WBC 9.6 12/09/2023   HGB 15.2 12/09/2023   HCT 43.9 12/09/2023   MCV 93.4 12/09/2023   PLT 306  12/09/2023      Component Value Date/Time   NA 133 (L) 12/09/2023 1352   NA 139 12/21/2015 1404   K 3.5 12/09/2023 1352   CL 103 12/09/2023 1352   CO2 21 (L) 12/09/2023 1352   GLUCOSE 112 (H) 12/09/2023 1352   BUN 15 12/09/2023 1352   BUN 7 12/21/2015 1404   CREATININE 1.07 12/09/2023 1352   CALCIUM  9.3 12/09/2023 1352   PROT 7.4 12/09/2023 1352   PROT 7.1 12/21/2015  1404   ALBUMIN  4.0 12/09/2023 1352   ALBUMIN  4.5 12/21/2015 1404   AST 21 12/09/2023 1352   ALT 20 12/09/2023 1352   ALKPHOS 92 12/09/2023 1352   BILITOT 0.8 12/09/2023 1352   BILITOT 0.2 12/21/2015 1404   GFRNONAA >60 12/09/2023 1352   GFRAA >60 09/09/2017 0400   Lab Results  Component Value Date   CHOL 137 11/10/2023   HDL 29 (L) 11/10/2023   LDLCALC 83 11/10/2023   TRIG 123 11/10/2023   CHOLHDL 4.7 11/10/2023   Lab Results  Component Value Date   HGBA1C 5.5 07/31/2023   Lab Results  Component Value Date   VITAMINB12 150 (L) 06/05/2015   Lab Results  Component Value Date   TSH 2.44 12/14/2021    MRI HEAD 07/31/2023: 1. Evolving subacute ischemic infarct involving the right occipital lobe. Associated petechial blood products without frank hemorrhagic transformation or significant regional mass effect. 2. Additional subcentimeter early subacute ischemic infarct involving the right frontal centrum semi ovale. 3. Underlying mild chronic microvascular ischemic disease with remote left basal ganglia lacunar infarct.   MRA HEAD 07/31/2023: 1. Negative intracranial MRA for large vessel occlusion. 2. Focal severe proximal right M2 stenosis, inferior division. 3. No other hemodynamically significant or correctable stenosis about the intracranial arterial circulation.   MRI Brain 11/10/2023 1. Acute or early subacute infarcts in the right basal ganglia and overlying right frontal lobe. 2. Remote right temporal/occipital infarct   CTA head and neck 11/09/2023 1. Occlusion of the distal right M1 segment. 2. Distal right MCA branch vessels are reconstituted via collaterals, less robust than on the left. 3. Mild attenuation of distal ACA branch vessels and distal left MCA branch vessels without significant proximal stenosis or occlusion. 4. Minimal atherosclerotic changes at the carotid bifurcations and cavernous internal carotid arteries bilaterally without significant stenosis. 5.  Moderate tortuosity of the mid cervical internal carotid arteries bilaterally without focal stenosis. 6. Atherosclerotic changes at the proximal left subclavian artery with luminal narrowing to 2 mm. This compares to more distal vessel of 8 mm. 7. Normal CT perfusion using standard criteria.    ASSESSMENT AND PLAN  58 y.o. year old male with hypertension, hyperlipidemia, chronic pain who is presenting presenting for follow-up after another stroke on May 5.  This was in the setting of discontinuing his aspirin  and Plavix  and he did have M1 occlusion status post stent placement and tiki 3 revascularization. Stroke etiology likely large vessel disease. He is currently on aspirin  and Brilinta  and was strongly recommended to stay on the medications for at least 6 months.  Fortunately for patient he does not have any major deficits from his stroke.  Plan will be for patient to postpone surgery at least for 55-months, continue aspirin  and Brilinta  For his sundowning, I do suspect this is a reaction from pain medications including a combination of Percocet, Dilaudid , Neurontin  and Xanax .  Will give him Seroquel, advised wife to contact me if his symptoms do not improve, at that time I  will refer him back to his PCP or pain management to review his meds.  I will see him in 6 months for follow-up or sooner if worse.   1. Cerebrovascular accident (CVA) due to occlusion of right middle cerebral artery (HCC)   2. Chronic right shoulder pain   3. Sundowning      Patient Instructions  Continue current medications including Aspirin  and Brilinta  for a total of 6 months, then continue with Aspirin  alone  Recommend against holding Aspirin  and Brilinta  for any procedure. Patient will have to wait for at least 6 months prior to having shoulder surgery  Will prescribed him Seroquel for his sundowning  Continue to follow up with your doctors  Return in  6 months or sooner if worse    No orders of the defined types  were placed in this encounter.   Meds ordered this encounter  Medications   QUEtiapine (SEROQUEL) 25 MG tablet    Sig: Take 1 tablet (25 mg total) by mouth at bedtime.    Dispense:  30 tablet    Refill:  0    Return in about 6 months (around 06/11/2024).   Cassandra Cleveland, MD 12/11/2023, 4:29 PM  [facility] [address], [address] [place], [place] [address] [phone]

## 2023-12-15 ENCOUNTER — Other Ambulatory Visit: Payer: Self-pay

## 2023-12-15 NOTE — Telephone Encounter (Signed)
 Requested Prescriptions   Pending Prescriptions Disp Refills   QUEtiapine  (SEROQUEL ) 25 MG tablet 30 tablet 0    Sig: Take 1 tablet (25 mg total) by mouth at bedtime.   Last seen 12/11/23, next appt 08/03/24  Dispenses   Dispensed Days Supply Quantity Provider Pharmacy  QUEtiapine  Fumarate 25MG  TAB 12/11/2023 30 30 each Cassandra Cleveland, MD [facility] [phone] ...    Requesting refill to soon

## 2024-02-19 ENCOUNTER — Telehealth: Payer: Self-pay

## 2024-02-19 NOTE — Patient Outreach (Signed)
 Telephone outreach to patient to obtain mRS was successfully completed. MRS= 3  Shereen Gin [facility] Health Care Management Assistant  Direct Dial: [phone]  Fax: [phone] Website: [email or web address]

## 2024-03-12 ENCOUNTER — Other Ambulatory Visit (HOSPITAL_COMMUNITY): Payer: Self-pay

## 2024-03-12 ENCOUNTER — Other Ambulatory Visit: Payer: Self-pay | Admitting: Family Medicine

## 2024-03-12 ENCOUNTER — Encounter (HOSPITAL_COMMUNITY): Payer: Self-pay

## 2024-03-12 DIAGNOSIS — R52 Pain, unspecified: Secondary | ICD-10-CM

## 2024-03-12 DIAGNOSIS — R29898 Other symptoms and signs involving the musculoskeletal system: Secondary | ICD-10-CM

## 2024-06-22 ENCOUNTER — Emergency Department (HOSPITAL_COMMUNITY)
Admission: EM | Admit: 2024-06-22 | Discharge: 2024-06-22 | Disposition: A | Discharge status: Home / Self Care | Source: Home / Self Care

## 2024-06-22 ENCOUNTER — Emergency Department (HOSPITAL_COMMUNITY)

## 2024-06-22 ENCOUNTER — Other Ambulatory Visit: Payer: Self-pay

## 2024-06-22 ENCOUNTER — Encounter (HOSPITAL_COMMUNITY): Payer: Self-pay | Admitting: Emergency Medicine

## 2024-06-22 DIAGNOSIS — Z5321 Procedure and treatment not carried out due to patient leaving prior to being seen by health care provider: Secondary | ICD-10-CM | POA: Diagnosis not present

## 2024-06-22 DIAGNOSIS — F0390 Unspecified dementia without behavioral disturbance: Secondary | ICD-10-CM | POA: Diagnosis not present

## 2024-06-22 DIAGNOSIS — Z8673 Personal history of transient ischemic attack (TIA), and cerebral infarction without residual deficits: Secondary | ICD-10-CM | POA: Insufficient documentation

## 2024-06-22 DIAGNOSIS — R4781 Slurred speech: Secondary | ICD-10-CM | POA: Diagnosis present

## 2024-06-22 LAB — ETHANOL: Alcohol, Ethyl (B): <15 mg/dL (ref <15)

## 2024-06-22 LAB — CBC
HCT: 34.8 % — ABNORMAL LOW (ref 39.0–52.0)
Hemoglobin: 11.1 g/dL — ABNORMAL LOW (ref 13.0–17.0)
MCH: 30.8 pg (ref 26.0–34.0)
MCHC: 31.9 g/dL (ref 30.0–36.0)
MCV: 96.7 fL (ref 80.0–100.0)
Platelets: 434 K/uL — ABNORMAL HIGH (ref 150–400)
RBC: 3.6 MIL/uL — ABNORMAL LOW (ref 4.22–5.81)
RDW: 13.6 % (ref 11.5–15.5)
WBC: 7.4 K/uL (ref 4.0–10.5)
nRBC: 0 % (ref 0.0–0.2)

## 2024-06-22 LAB — COMPREHENSIVE METABOLIC PANEL WITH GFR
ALT: 17 U/L (ref 0–44)
AST: 19 U/L (ref 15–41)
Albumin: 4.4 g/dL (ref 3.5–5.0)
Alkaline Phosphatase: 100 U/L (ref 38–126)
Anion gap: 13 (ref 5–15)
BUN: 10 mg/dL (ref 6–20)
CO2: 27 mmol/L (ref 22–32)
Calcium: 9.3 mg/dL (ref 8.9–10.3)
Chloride: 101 mmol/L (ref 98–111)
Creatinine, Ser: 1.03 mg/dL (ref 0.61–1.24)
GFR, Estimated: >60 mL/min (ref >60)
Glucose, Bld: 90 mg/dL (ref 70–99)
Potassium: 3.6 mmol/L (ref 3.5–5.1)
Sodium: 141 mmol/L (ref 135–145)
Total Bilirubin: <0.2 mg/dL (ref 0.0–1.2)
Total Protein: 7 g/dL (ref 6.5–8.1)

## 2024-06-22 LAB — DIFFERENTIAL
Abs Immature Granulocytes: 0.03 K/uL (ref 0.00–0.07)
Basophils Absolute: 0 K/uL (ref 0.0–0.1)
Basophils Relative: 0 %
Eosinophils Absolute: 0.1 K/uL (ref 0.0–0.5)
Eosinophils Relative: 2 %
Immature Granulocytes: 0 %
Lymphocytes Relative: 51 %
Lymphs Abs: 3.7 K/uL (ref 0.7–4.0)
Monocytes Absolute: 0.6 K/uL (ref 0.1–1.0)
Monocytes Relative: 7 %
Neutro Abs: 3 K/uL (ref 1.7–7.7)
Neutrophils Relative %: 40 %

## 2024-06-22 LAB — PROTIME-INR
INR: 0.9 (ref 0.8–1.2)
Prothrombin Time: 12.7 s (ref 11.4–15.2)

## 2024-06-22 LAB — APTT: aPTT: 33 s (ref 24–36)

## 2024-06-22 MED ORDER — IOHEXOL 350 MG/ML SOLN: 75.0000 mL | Freq: Once | INTRAVENOUS | Status: DC | PRN

## 2024-06-22 MED ORDER — SODIUM CHLORIDE 0.9% FLUSH
3.0000 mL | Freq: Once | INTRAVENOUS | Status: AC
  Administered 2024-06-22: 20:00:00 3 mL via INTRAVENOUS

## 2024-06-22 NOTE — ED Triage Notes (Addendum)
 Pt BIB [facility] from home with c/o stuttering and slurred speech since 0700. EMS noted no deficits. PT and alert and oriented x 4. PT reports that he has HX of dementia.   HX of prior strokes and required stent placement. VS with EMS is 158/92 NSR 82 resp 16 100 % RA. Kellogg RN

## 2024-08-03 ENCOUNTER — Ambulatory Visit (INDEPENDENT_AMBULATORY_CARE_PROVIDER_SITE_OTHER): Admitting: Neurology

## 2024-08-03 ENCOUNTER — Encounter: Payer: Self-pay | Admitting: Neurology

## 2024-08-03 ENCOUNTER — Other Ambulatory Visit: Payer: Self-pay | Admitting: *Deleted

## 2024-08-03 VITALS — BP 150/102 | HR 93 | Ht 69.0 in | Wt 182.5 lb

## 2024-08-03 DIAGNOSIS — I63511 Cerebral infarction due to unspecified occlusion or stenosis of right middle cerebral artery: Secondary | ICD-10-CM

## 2024-08-03 DIAGNOSIS — L02419 Cutaneous abscess of limb, unspecified: Secondary | ICD-10-CM

## 2024-08-03 DIAGNOSIS — G8929 Other chronic pain: Secondary | ICD-10-CM | POA: Diagnosis not present

## 2024-08-03 DIAGNOSIS — F05 Delirium due to known physiological condition: Secondary | ICD-10-CM | POA: Diagnosis not present

## 2024-08-03 DIAGNOSIS — M25511 Pain in right shoulder: Secondary | ICD-10-CM | POA: Diagnosis not present

## 2024-08-03 DIAGNOSIS — R4189 Other symptoms and signs involving cognitive functions and awareness: Secondary | ICD-10-CM | POA: Diagnosis not present

## 2024-08-03 NOTE — Progress Notes (Signed)
 "   [facility]  PATIENT: Mathew Brady DOB: 10/07/1965  REQUESTING CLINICIAN: Shona Norleen PEDLAR, MD HISTORY FROM: Patient and spouse  REASON FOR VISIT: Strokes follow up.    HISTORICAL  CHIEF COMPLAINT:  Chief Complaint  Patient presents with   Follow-up    Room 13 With wife Cerebrovascular accident   INTERVAL HISTORY 08/03/2024 Patient presents today for follow-up.  He is accompanied by wife.  Last visit was 6 months ago, since then, he tells me that he has been doing well from a stroke perspective.  He is on Brilinta  and Aspirin , denies any new deficits.  His main complaint today is his behavior.  Both patient and wife report outbursts of anger, sundowning, memory loss.  At last visit, we have tried him on Seroquel  but he did not continue the medication due to side effect, felt the dose was too strong causing a lot of drowsiness .  He is trying to get help with his behavior and have started therapy.  He also reports severe insomnia, usually get between 2 to 4 hours per night.  He was previously on Percocet, Dilaudid , Xanax  but currently only on Percocet.  He has not follow-up with his orthopedist regarding his chronic shoulder pain. He is currently separated with his wife.   INTERVAL HISTORY 12/11/2023 Patient presents today for follow-up, last visit was in March, at that time he presented for surgical clearance.  He did have a stroke, right hemispheric stroke in January and wanted clearance for a right shoulder surgery.  At that time I told him that recommendation is to wait 41-months prior to any elective surgery.  He told me that he followed-up with his orthopedist surgeon, had a surgery scheduled for May 14 and prior to any additional recommendation he self discontinued his aspirin  and Plavix  and 3, 4 days later he presented to the hospital with acute stroke from a right M1 occlusion.  Patient was taken to IR suite, he did have stent placement with tici 3 reperfusion.  He did  well from the stroke perspective, does not have any major deficit but was strongly advised to remain on aspirin  and Brilinta  for at least 6 months.  He tells me that surgery is off the table at the moment.  He is currently on strong pain medications including Percocet, and Dilaudid .  He is also on Neurontin  and Xanax .  He tells me that his right arm is useless.  Wife tells me that patient has been more irritable, has been hallucinating mainly in the evening, seeing people coming out of the mattress, people in the front yard, he has been accusatory towards wife telling her that he knows what she did but does not go into details.  He is also paranoid thinking that people are putting things in his drink, and he is complaining of burning pain on his chest and also a tingling sensation in his penis.  These symptoms usually happen in the evening around the twilight time and he tells me that he hates twilight tired and he gets really nervous around that time.   Brief hospital course and summary  59 y.o. patient with history of anemia, anxiety, arthritis, coronary artery disease, COPD, essential hypertension, GERD, obstructive sleep apnea not on CPAP, opiate use, peripheral neuropathy, remote CVA in January 2025, and vitamin B12 deficiency who was admitted with fluctuating left hemiparesis, left facial droop, left-sided sensory loss, and dysarthria from OSH.  Patient was transported to [facility] for IR thrombectomy after  vessel imaging revealed a right M1 occlusion.  IR thrombectomy with complete achieving TICI2C revascularization followed by reocclusion to severe underlying intracranial arteriosclerosis s/p stent assisted angioplasty with complete TICI 3 revascularization. Initial NIHSS of 8-12 with fluctuating symptoms on arrival.   HOSPITAL COURSE Acute Ischemic Infarct: Acute infarct in the right basal ganglia and overlying right frontal lobe secondary to right M1 MCA occlusion s/p complete TICI 2 C  revascularization with reocclusion due to severe underlying intracranial arteriosclerosis requiring subsequent stent-assisted angioplasty with resultant TICI 3 revascularization.  Etiology:  Severe intracranial arteriosclerosis  CT head: No acute intracranial abnormality or significant interval change.  Expected evolution of previously seen right occipital lobe infarct.  Stable focal hypoattenuation along the anterior margin of the left lentiform nucleus.  This likely represents a small chronic lacunar infarct. aspects is 10. CTA head & neck: Occlusion of the distal right M1 segment.  Distal right MCA branch vessels are reconstituted via collaterals, less robust than on the left.  Mild attenuation of distal ACA branch vessels and distal left MCA branch vessels without significant proximal stenosis or occlusion.  Minimal atherosclerotic changes at the carotid bifurcations and cavernous internal carotid arteries bilaterally without significant stenosis.  Moderate tortuosity of the mid cervical internal carotid arteries bilaterally without focal stenosis.  Atherosclerotic changes of the proximal left subclavian artery with luminal narrowing to 2 mm.  This compares to more distal vessel of 8 mm. CT perfusion: Normal CT perfusion using standard criteria. Post IR CT: Mild enhancement within the right lentiform nucleus compatible may represent subacute infarct.  Remote infarct of the posterior right temporal and occipital lobe is again noted.  Stent is now noted within the distal right M1 segment.  Right MCA branch vessels opacify postcontrast.  Stable mild white matter disease.  This likely reflects the sequela of chronic microvascular ischemia. MRI brain: Acute or early subacute infarcts in the right basal ganglia and overlying right frontal lobe.  Remote right temporal/occipital infarct. 2D Echo EF 60 to 65%, mild LVH. LDL 62 HgbA1c 5.5 VTE prophylaxis - SCDs aspirin  81 mg daily and clopidogrel  75 mg daily  prior to admission, now on aspirin  81 mg daily and Brilinta  (ticagrelor ) 90 mg bid for 6 months due to intracranial stent and then aspirin  alone. Therapy recommendations:  No follow up needed  Disposition:  Discharge to home    HISTORY OF PRESENT ILLNESS:  This is a 59 year old gentleman past medical history of hypertension, hyperlipidemia, chronic back pain chronic shoulder pain, recent strokes in January who is presenting for surgical clearance.  Patient tells me in January he was admitted for an acute stroke but a few days prior to that he was complaining of confusion, irritability, vision problem.  His MRI showed subacute right frontal and right occipital strokes with petechial hemorrhage but no frank hemorrhagic transformation.  MRA with a severe proximal right M2 stenosis.  He was recommended aspirin  and Plavix  for total of 90 days.  He did not go to rehab.   His main complaint today is right shoulder pain and need for surgical clearance.  I have explained to the patient that typically we wait 6 months after an acute stroke before surgery but he stated that he cannot wait 6 months and he need to have his surgery as soon as possible.   Hospital Course and Summary  59 year old M with PMH of COPD, HTN, anxiety, chronic back pain and tobacco use disorder noncompliant with medications presenting with lightheadedness for about 2 weeks, and  admitted with uncontrolled hypertension and subacute right frontal and occipital CVA with petechial hemorrhage but no frank hemorrhagic transformation, and remote left basal ganglia lacunar infarct as noted on MRI brain.  MRA head without LVO but focal severe proximal right M2 stenosis.  Patient was admitted for CVA workup.  Neurology consulted, and recommended fluoroscopy guided lumbar puncture.    Patient underwent fluoroscopy guided lumbar puncture.  Preliminary result on CSF reassuring.  Echocardiogram and carotid ultrasound without significant finding.  EEG  negative for seizure or epileptiform discharge.  UDS positive for amphetamine although patient denies using amphetamine iron products or over-the-counter cold and flu meds recently.   Later in the afternoon, patient and family asked if they could be discharged.  Discussed with neurology who cleared patient for discharge on Plavix  and aspirin  for 3 months followed by aspirin  alone.  CSF fluid studies pending.  Subacute CVA: Patient presented with lightheadedness but no focal neurodeficit.  MRI brain suggestive of subacute CVA.  Right frontal and occipital region.  MRA head without LVO but focal severe proximal right M2 stenosis.  Risk factor include HTN, smoking and noncompliance with meds.  His UDS is also positive for amphetamines although he denies recreational drug use or recent cold and flu medication.  EEG without epileptiform discharges or seizure.  TTE and carotid ultrasound unrevealing.  No history of diabetes.  LDL 62.  A1c 5.5%.  Preliminary result on CSF reassuring. -Neurology recommended Plavix  and aspirin  for 3 months followed by aspirin  alone -Neurology also recommended avoiding hypotension.  HCTZ discontinued. -Counseled on the importance of compliance with medication and smoking cessation. -Discussed urine drug screen finding with patient.  -No need antibiotic by PT OT -Follow other CSF labs.   OTHER MEDICAL CONDITIONS: Hypertension, hyperlipidemia, chronic back pain, chronic right shoulder pain   REVIEW OF SYSTEMS: Full 14 system review of systems performed and negative with exception of: As noted in the HPI   ALLERGIES: Allergies  Allergen Reactions   Succinylcholine Anaphylaxis   Pregabalin  Photosensitivity    HOME MEDICATIONS: Outpatient Medications Prior to Visit  Medication Sig Dispense Refill   albuterol  (PROVENTIL  HFA;VENTOLIN  HFA) 108 (90 Base) MCG/ACT inhaler Inhale 2 puffs into the lungs every 4 (four) hours as needed for wheezing or shortness of breath. 1  Inhaler 6   amLODipine  (NORVASC ) 10 MG tablet Take 10 mg by mouth daily in the afternoon.     aspirin  81 MG chewable tablet Chew 1 tablet (81 mg total) by mouth daily. 30 tablet 1   gabapentin  (NEURONTIN ) 800 MG tablet Take 1 tablet (800 mg total) by mouth 3 (three) times daily. (Patient taking differently: Take 800 mg by mouth in the morning, at noon, in the evening, and at bedtime.) 90 tablet 0   ondansetron  (ZOFRAN ) 4 MG tablet Take 4 mg by mouth every 8 (eight) hours as needed for refractory nausea / vomiting, vomiting or nausea.     oxyCODONE -acetaminophen  (PERCOCET) 10-325 MG tablet Take 1 tablet by mouth 4 (four) times daily.     QUEtiapine  (SEROQUEL ) 25 MG tablet Take 1 tablet (25 mg total) by mouth at bedtime. 30 tablet 0   ticagrelor  (BRILINTA ) 90 MG TABS tablet Take 1 tablet (90 mg total) by mouth 2 (two) times daily. 60 tablet 2   ALPRAZolam  (XANAX ) 0.5 MG tablet Take 0.5 mg by mouth at bedtime as needed for anxiety. (Patient not taking: Reported on 08/03/2024)     HYDROmorphone  (DILAUDID ) 2 MG tablet Take 2 mg by mouth every  4 (four) hours. (Patient not taking: Reported on 08/03/2024)     methocarbamol  (ROBAXIN ) 500 MG tablet Take 500 mg by mouth at bedtime. (Patient not taking: Reported on 08/03/2024)     nicotine  (NICODERM CQ  - DOSED IN MG/24 HOURS) 14 mg/24hr patch Place 14 mg onto the skin daily. (Patient not taking: Reported on 08/03/2024)     prochlorperazine  (COMPAZINE ) 10 MG tablet Take 1 tablet (10 mg total) by mouth 2 (two) times daily as needed for nausea or vomiting (Nausea ). (Patient not taking: Reported on 08/03/2024) 10 tablet 0   rosuvastatin  (CRESTOR ) 20 MG tablet Take 1 tablet (20 mg total) by mouth daily. (Patient taking differently: Take 20 mg by mouth every evening.) 90 tablet 3   No facility-administered medications prior to visit.    PAST MEDICAL HISTORY: Past Medical History:  Diagnosis Date   Anemia 06/18/2012   Anxiety    Arthritis    CAD (coronary artery  disease)    Mild to moderate nonobstructive CAD at cardiac catheterization 2018   Complication of anesthesia    hard to wake up   COPD (chronic obstructive pulmonary disease) (HCC)    Essential hypertension    Fibromyalgia    GERD (gastroesophageal reflux disease)    History of kidney stones    Opiate use    Palpitations    Peripheral neuropathy    Pulmonary nodule    Stroke (HCC)    Vitamin B 12 deficiency     PAST SURGICAL HISTORY: Past Surgical History:  Procedure Laterality Date   COLONOSCOPY     found to have 1 polyp   ESOPHAGEAL DILATION  1993   IR CT HEAD LTD  11/09/2023   IR CT HEAD LTD  11/09/2023   IR CT HEAD LTD  11/09/2023   IR PERCUTANEOUS ART THROMBECTOMY/INFUSION INTRACRANIAL INC DIAG ANGIO  11/09/2023   IR RADIOLOGIST EVAL & MGMT  11/27/2023   LEFT HEART CATH AND CORONARY ANGIOGRAPHY N/A 12/09/2016   Procedure: Left Heart Cath and Coronary Angiography;  Surgeon: Mady Bruckner, MD;  Location: [facility] INVASIVE CV LAB;  Service: Cardiovascular;  Laterality: N/A;   LUNG SURGERY Left    2018 upper lobe   RADIOLOGY WITH ANESTHESIA N/A 11/09/2023   Procedure: RADIOLOGY WITH ANESTHESIA;  Surgeon: Radiologist, Medication, MD;  Location: [facility] OR;  Service: Radiology;  Laterality: N/A;  IR ANESTH   TONSILLECTOMY     VIDEO ASSISTED THORACOSCOPY (VATS)/WEDGE RESECTION Left 09/08/2017   Procedure: VIDEO ASSISTED THORACOSCOPY (VATS)/WEDGE RESECTION;  Surgeon: Kerrin Elspeth BROCKS, MD;  Location: [facility] OR;  Service: Thoracic;  Laterality: Left;    FAMILY HISTORY: Family History  Problem Relation Age of Onset   Fibromyalgia Mother    Bladder Cancer Father 58   Benign prostatic hyperplasia Father    Fibromyalgia Sister    Lung cancer Maternal Uncle    Emphysema Maternal Uncle    Lung cancer Paternal Uncle    Stomach cancer Paternal Uncle    Lung cancer Maternal Grandmother    Heart failure Paternal Grandmother    Heart failure Paternal Grandfather    Lung cancer Paternal Grandfather     Healthy Son     SOCIAL HISTORY: Social History   Socioeconomic History   Marital status: Married    Spouse name: Not on file   Number of children: Not on file   Years of education: Not on file   Highest education level: Not on file  Occupational History   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  Tobacco Use  Smoking status: Former    Current packs/day: 0.00    Average packs/day: 1 pack/day for 36.0 years (36.0 ttl pk-yrs)    Types: Cigarettes    Start date: 03/12/1979    Quit date: 03/09/2015    Years since quitting: 9.4    Passive exposure: Past   Smokeless tobacco: Never   Tobacco comments:    1ppd 12/14/21, on nicotine  patches ( since January 2025)  Vaping Use   Vaping status: Never Used  Substance and Sexual Activity   Alcohol use: No    Alcohol/week: 0.0 standard drinks of alcohol   Drug use: No   Sexual activity: Not on file  Other Topics Concern   Not on file  Social History Narrative   Lives with wife in a 2 story home.  Has 5 children all together.  Not working at this time.   On Leave.  Run shop contruction site.   Education: Some high school.      [facility] (12/13/16):   Originally from [facility]. Previously has worked energy manager, as a psychologist, occupational, barrister's clerk. Has had asbestos exposure. Currently has a Cockatoo and chickens. Also has horses, a donkey, dogs, and a cat. No mold exposure. No hot tub exposure.    Social Drivers of Health   Tobacco Use: Medium Risk (08/03/2024)   Patient History    Smoking Tobacco Use: Former    Smokeless Tobacco Use: Never    Passive Exposure: Past  Physicist, Medical Strain: Not on file  Food Insecurity: No Food Insecurity (11/21/2023)   Hunger Vital Sign    Worried About Running Out of Food in the Last Year: Never true    Ran Out of Food in the Last Year: Never true  Transportation Needs: No Transportation Needs (11/21/2023)   PRAPARE - Administrator, Civil Service (Medical): No    Lack of Transportation  (Non-Medical): No  Physical Activity: Not on file  Stress: Not on file  Social Connections: Not on file  Intimate Partner Violence: Not At Risk (11/21/2023)   Humiliation, Afraid, Rape, and Kick questionnaire    Fear of Current or Ex-Partner: No    Emotionally Abused: No    Physically Abused: No    Sexually Abused: No  Depression (PHQ2-9): Not on file  Alcohol Screen: Not on file  Housing: Low Risk (11/21/2023)   Housing Stability Vital Sign    Unable to Pay for Housing in the Last Year: No    Number of Times Moved in the Last Year: 0    Homeless in the Last Year: No  Utilities: Not At Risk (11/21/2023)   [facility] Utilities    Threatened with loss of utilities: No  Health Literacy: Not on file    PHYSICAL EXAM  GENERAL EXAM/CONSTITUTIONAL: Vitals:  Vitals:   08/03/24 1106  BP: (!) 150/102  Pulse: 93  SpO2: 98%  Weight: 182 lb 8 oz (82.8 kg)  Height: 5' 9 (1.753 m)   Body mass index is 26.95 kg/m. Wt Readings from Last 3 Encounters:  08/03/24 182 lb 8 oz (82.8 kg)  06/22/24 200 lb (90.7 kg)  12/11/23 191 lb (86.6 kg)   Patient is in no distress; well developed, nourished and groomed; neck is supple, appears in pain  MUSCULOSKELETAL: Gait, strength, tone, movements noted in Neurologic exam below  NEUROLOGIC: MENTAL STATUS:     12/11/2023   11:30 AM  MMSE - Mini Mental State Exam  Orientation to time 4  Orientation to Place  5  Registration 3  Attention/ Calculation 0  Recall 3  Language- name 2 objects 2  Language- repeat 1  Language- follow 3 step command 3  Language- read & follow direction 1  Write a sentence 0  Copy design 0  Total score 22   awake, alert, oriented to person, place and difficulty with date but knows the month and year Difficulty with attention and concentration language fluent, comprehension intact, naming intact fund of knowledge appropriate  CRANIAL NERVE:  2nd, 3rd, 4th, 6th - Visual fields full to confrontation, extraocular muscles  intact, no nystagmus 5th - facial sensation symmetric 7th - facial strength symmetric 8th - hearing intact 9th - palate elevates symmetrically, uvula midline 11th - shoulder shrug symmetric 12th - tongue protrusion midline  MOTOR:  normal bulk and tone, full strength in the BUE, BLE. RUE is limited by pain  SENSORY:  normal and symmetric to light touch  COORDINATION:  finger-nose-finger, fine finger movements normal  GAIT/STATION:  Antalgic    DIAGNOSTIC DATA (LABS, IMAGING, TESTING) - I reviewed patient records, labs, notes, testing and imaging myself where available.  Lab Results  Component Value Date   WBC 7.4 06/22/2024   HGB 11.1 (L) 06/22/2024   HCT 34.8 (L) 06/22/2024   MCV 96.7 06/22/2024   PLT 434 (H) 06/22/2024      Component Value Date/Time   NA 141 06/22/2024 1948   NA 139 12/21/2015 1404   K 3.6 06/22/2024 1948   CL 101 06/22/2024 1948   CO2 27 06/22/2024 1948   GLUCOSE 90 06/22/2024 1948   BUN 10 06/22/2024 1948   BUN 7 12/21/2015 1404   CREATININE 1.03 06/22/2024 1948   CALCIUM  9.3 06/22/2024 1948   PROT 7.0 06/22/2024 1948   PROT 7.1 12/21/2015 1404   ALBUMIN  4.4 06/22/2024 1948   ALBUMIN  4.5 12/21/2015 1404   AST 19 06/22/2024 1948   ALT 17 06/22/2024 1948   ALKPHOS 100 06/22/2024 1948   BILITOT <0.2 06/22/2024 1948   BILITOT 0.2 12/21/2015 1404   GFRNONAA >60 06/22/2024 1948   GFRAA >60 09/09/2017 0400   Lab Results  Component Value Date   CHOL 137 11/10/2023   HDL 29 (L) 11/10/2023   LDLCALC 83 11/10/2023   TRIG 123 11/10/2023   CHOLHDL 4.7 11/10/2023   Lab Results  Component Value Date   HGBA1C 5.5 07/31/2023   Lab Results  Component Value Date   VITAMINB12 150 (L) 06/05/2015   Lab Results  Component Value Date   TSH 2.44 12/14/2021    MRI HEAD 07/31/2023: 1. Evolving subacute ischemic infarct involving the right occipital lobe. Associated petechial blood products without frank hemorrhagic transformation or significant  regional mass effect. 2. Additional subcentimeter early subacute ischemic infarct involving the right frontal centrum semi ovale. 3. Underlying mild chronic microvascular ischemic disease with remote left basal ganglia lacunar infarct.   MRA HEAD 07/31/2023: 1. Negative intracranial MRA for large vessel occlusion. 2. Focal severe proximal right M2 stenosis, inferior division. 3. No other hemodynamically significant or correctable stenosis about the intracranial arterial circulation.   MRI Brain 11/10/2023 1. Acute or early subacute infarcts in the right basal ganglia and overlying right frontal lobe. 2. Remote right temporal/occipital infarct   CTA head and neck 11/09/2023 1. Occlusion of the distal right M1 segment. 2. Distal right MCA branch vessels are reconstituted via collaterals, less robust than on the left. 3. Mild attenuation of distal ACA branch vessels and distal left MCA branch vessels without  significant proximal stenosis or occlusion. 4. Minimal atherosclerotic changes at the carotid bifurcations and cavernous internal carotid arteries bilaterally without significant stenosis. 5. Moderate tortuosity of the mid cervical internal carotid arteries bilaterally without focal stenosis. 6. Atherosclerotic changes at the proximal left subclavian artery with luminal narrowing to 2 mm. This compares to more distal vessel of 8 mm. 7. Normal CT perfusion using standard criteria.    ASSESSMENT AND PLAN  59 y.o. year old male with hypertension, hyperlipidemia, chronic pain who is presenting for follow-up after another stroke on May 5.  This was in the setting of discontinuing his aspirin  and Plavix  and he did have M1 occlusion status post stent placement and TICI 3 revascularization. Stroke etiology likely large vessel disease. From a stroke perspective, he is doing well, currently on aspirin  and Brilinta  no new symptoms.  For his sundowning, behavior and cognitive impairment, we will  repeat MRI brain, advised patient to restart the Seroquel  but if unable to tolerated we will put him on the combination of trazodone  and citalopram.  I will see him for follow-up in 6 months but he understands to contact me with updates.    1. Cerebrovascular accident (CVA) due to occlusion of right middle cerebral artery (HCC)   2. Chronic right shoulder pain   3. Sundowning   4. Cognitive impairment     Patient Instructions  Continue current medication including aspirin  and Brilinta  Restart Seroquel , if unable to tolerate it we will put patient on a combination of citalopram and trazodone  MRI brain without contrast Continue follow-up PCP Follow-up in 6 months or sooner if worse, please call with updates  Orders Placed This Encounter  Procedures   MR BRAIN WO CONTRAST    No orders of the defined types were placed in this encounter.   Return in about 6 months (around 01/31/2025).   Pastor Falling, MD 08/03/2024, 12:16 PM  [facility] [address], [address] [place], [place] [address] [phone]  "

## 2024-08-03 NOTE — Patient Instructions (Signed)
 Continue current medication including aspirin  and Brilinta  Restart Seroquel , if unable to tolerate it we will put patient on a combination of citalopram and trazodone  MRI brain without contrast Continue follow-up PCP Follow-up in 6 months or sooner if worse, please call with updates

## 2024-08-05 ENCOUNTER — Encounter: Payer: Self-pay | Admitting: General Surgery

## 2024-08-05 ENCOUNTER — Ambulatory Visit (INDEPENDENT_AMBULATORY_CARE_PROVIDER_SITE_OTHER): Admitting: General Surgery

## 2024-08-05 VITALS — BP 138/82 | HR 85 | Temp 97.9°F | Resp 14 | Ht 69.0 in | Wt 184.0 lb

## 2024-08-05 DIAGNOSIS — L02419 Cutaneous abscess of limb, unspecified: Secondary | ICD-10-CM

## 2024-08-06 NOTE — Progress Notes (Signed)
 Mathew Brady; [id number]; 03-31-1966   HPI Patient is a 59 year old white male who was referred to my care by Dr. Shona for evaluation and treatment of an arthropod bite to the right thigh.  Patient was seen originally by his primary care physician on 07/28/2024.  An abscess was present at that time.  The patient was started on doxycycline .  Since that time, the drainage has decreased as well as the redness and hardness.  Patient denies any fevers. Past Medical History:  Diagnosis Date   Anemia 06/18/2012   Anxiety    Arthritis    CAD (coronary artery disease)    Mild to moderate nonobstructive CAD at cardiac catheterization 2018   Complication of anesthesia    hard to wake up   COPD (chronic obstructive pulmonary disease) (HCC)    Essential hypertension    Fibromyalgia    GERD (gastroesophageal reflux disease)    History of kidney stones    Opiate use    Palpitations    Peripheral neuropathy    Pulmonary nodule    Stroke (HCC)    Vitamin B 12 deficiency     Past Surgical History:  Procedure Laterality Date   COLONOSCOPY     found to have 1 polyp   ESOPHAGEAL DILATION  1993   IR CT HEAD LTD  11/09/2023   IR CT HEAD LTD  11/09/2023   IR CT HEAD LTD  11/09/2023   IR PERCUTANEOUS ART THROMBECTOMY/INFUSION INTRACRANIAL INC DIAG ANGIO  11/09/2023   IR RADIOLOGIST EVAL & MGMT  11/27/2023   LEFT HEART CATH AND CORONARY ANGIOGRAPHY N/A 12/09/2016   Procedure: Left Heart Cath and Coronary Angiography;  Surgeon: Mady Bruckner, MD;  Location: [facility];  Service: Cardiovascular;  Laterality: N/A;   LUNG SURGERY Left    2018 upper lobe   RADIOLOGY WITH ANESTHESIA N/A 11/09/2023   Procedure: RADIOLOGY WITH ANESTHESIA;  Surgeon: Radiologist, Medication, MD;  Location: [facility] OR;  Service: Radiology;  Laterality: N/A;  IR ANESTH   TONSILLECTOMY     VIDEO ASSISTED THORACOSCOPY (VATS)/WEDGE RESECTION Left 09/08/2017   Procedure: VIDEO ASSISTED THORACOSCOPY (VATS)/WEDGE RESECTION;  Surgeon:  Kerrin Elspeth BROCKS, MD;  Location: [facility] OR;  Service: Thoracic;  Laterality: Left;    Family History  Problem Relation Age of Onset   Fibromyalgia Mother    Bladder Cancer Father 70   Benign prostatic hyperplasia Father    Fibromyalgia Sister    Lung cancer Maternal Uncle    Emphysema Maternal Uncle    Lung cancer Paternal Uncle    Stomach cancer Paternal Uncle    Lung cancer Maternal Grandmother    Heart failure Paternal Grandmother    Heart failure Paternal Grandfather    Lung cancer Paternal Grandfather    Healthy Son     Medications Ordered Prior to [employer]  Allergies[2]  Social History   Substance and Sexual Activity  Alcohol Use No   Alcohol/week: 0.0 standard drinks of alcohol    Tobacco Use History[3]  Review of Systems  Constitutional:  Positive for malaise/fatigue.  HENT: Negative.    Eyes: Negative.   Respiratory: Negative.    Cardiovascular: Negative.   Gastrointestinal: Negative.   Genitourinary: Negative.   Musculoskeletal:  Positive for back pain.  Neurological: Negative.   Endo/Heme/Allergies: Negative.   Psychiatric/Behavioral: Negative.      Objective   Vitals:   08/05/24 1124  BP: 138/82  Pulse: 85  Resp: 14  Temp: 97.9 F (36.6 C)  SpO2: 98%  Physical Exam Vitals reviewed.  Constitutional:      Appearance: Normal appearance. He is not ill-appearing.  HENT:     Head: Normocephalic and atraumatic.  Cardiovascular:     Rate and Rhythm: Normal rate and regular rhythm.     Heart sounds: Normal heart sounds. No murmur heard.    No friction rub. No gallop.  Pulmonary:     Effort: Pulmonary effort is normal. No respiratory distress.     Breath sounds: Normal breath sounds. No stridor. No wheezing, rhonchi or rales.  Skin:    General: Skin is warm and dry.     Comments: Wound on right thigh with no purulent drainage present.  Mild erythema and minimal induration noted.  Neurological:     Mental Status: He is alert and  oriented to person, place, and time.     Media Information  Document Information  Photographic Image: Photos    08/05/2024 11:35  Attached To:  Office Visit on 08/05/24 with Mavis Anes, MD  Source Information  Mavis Anes, MD  [facility]    Assessment  Abscess of right thigh secondary to arthropod bite, resolving Plan  No need for surgical intervention at the present time.  Patient should complete his antibiotic course.  Continue local wound care.  Follow-up with me as needed.    [1]  Current Outpatient Medications on File Prior to Visit  Medication Sig Dispense Refill   albuterol  (PROVENTIL  HFA;VENTOLIN  HFA) 108 (90 Base) MCG/ACT inhaler Inhale 2 puffs into the lungs every 4 (four) hours as needed for wheezing or shortness of breath. 1 Inhaler 6   amLODipine  (NORVASC ) 10 MG tablet Take 10 mg by mouth daily in the afternoon.     aspirin  81 MG chewable tablet Chew 1 tablet (81 mg total) by mouth daily. 30 tablet 1   gabapentin  (NEURONTIN ) 800 MG tablet Take 1 tablet (800 mg total) by mouth 3 (three) times daily. (Patient taking differently: Take 800 mg by mouth in the morning, at noon, in the evening, and at bedtime.) 90 tablet 0   ondansetron  (ZOFRAN ) 4 MG tablet Take 4 mg by mouth every 8 (eight) hours as needed for refractory nausea / vomiting, vomiting or nausea.     oxyCODONE -acetaminophen  (PERCOCET) 10-325 MG tablet Take 1 tablet by mouth 4 (four) times daily.     QUEtiapine  (SEROQUEL ) 25 MG tablet Take 1 tablet (25 mg total) by mouth at bedtime. 30 tablet 0   rosuvastatin  (CRESTOR ) 20 MG tablet Take 1 tablet (20 mg total) by mouth daily. (Patient taking differently: Take 20 mg by mouth every evening.) 90 tablet 3   ticagrelor  (BRILINTA ) 90 MG TABS tablet Take 1 tablet (90 mg total) by mouth 2 (two) times daily. 60 tablet 2   No current facility-administered medications on file prior to visit.  [2]  Allergies Allergen Reactions   Succinylcholine  Anaphylaxis   Pregabalin  Photosensitivity  [3]  Social History Tobacco Use  Smoking Status Every Day   Current packs/day: 0.00   Average packs/day: 1 pack/day for 36.0 years (36.0 ttl pk-yrs)   Types: Cigarettes   Start date: 03/12/1979   Last attempt to quit: 03/09/2015   Years since quitting: 9.4   Passive exposure: Past  Smokeless Tobacco Never  Tobacco Comments   1ppd 12/14/21, on nicotine  patches ( since January 2025)

## 2024-08-10 ENCOUNTER — Ambulatory Visit (HOSPITAL_COMMUNITY)
Admission: RE | Admit: 2024-08-10 | Discharge: 2024-08-10 | Disposition: A | Source: Ambulatory Visit | Attending: Neurology

## 2024-08-10 DIAGNOSIS — I63511 Cerebral infarction due to unspecified occlusion or stenosis of right middle cerebral artery: Secondary | ICD-10-CM

## 2024-08-10 DIAGNOSIS — R4189 Other symptoms and signs involving cognitive functions and awareness: Secondary | ICD-10-CM

## 2024-08-11 ENCOUNTER — Ambulatory Visit: Payer: Self-pay | Admitting: Neurology

## 2025-02-03 ENCOUNTER — Ambulatory Visit: Admitting: Neurology
